# Patient Record
Sex: Male | Born: 1950 | Race: White | Hispanic: No | Marital: Married | Smoking: Former smoker
Health system: Southern US, Community
[De-identification: ages and names within clinical notes are randomized; demographics above are authoritative.]

## PROBLEM LIST (undated history)

## (undated) DIAGNOSIS — K5790 Diverticulosis of intestine, part unspecified, without perforation or abscess without bleeding: Secondary | ICD-10-CM

## (undated) DIAGNOSIS — T508X5A Adverse effect of diagnostic agents, initial encounter: Secondary | ICD-10-CM

## (undated) DIAGNOSIS — Z923 Personal history of irradiation: Secondary | ICD-10-CM

## (undated) DIAGNOSIS — I7090 Unspecified atherosclerosis: Secondary | ICD-10-CM

## (undated) DIAGNOSIS — I10 Essential (primary) hypertension: Secondary | ICD-10-CM

## (undated) DIAGNOSIS — J439 Emphysema, unspecified: Secondary | ICD-10-CM

## (undated) DIAGNOSIS — I714 Abdominal aortic aneurysm, without rupture, unspecified: Secondary | ICD-10-CM

## (undated) DIAGNOSIS — C3490 Malignant neoplasm of unspecified part of unspecified bronchus or lung: Secondary | ICD-10-CM

## (undated) DIAGNOSIS — I639 Cerebral infarction, unspecified: Secondary | ICD-10-CM

## (undated) DIAGNOSIS — K760 Fatty (change of) liver, not elsewhere classified: Secondary | ICD-10-CM

## (undated) DIAGNOSIS — N200 Calculus of kidney: Secondary | ICD-10-CM

## (undated) DIAGNOSIS — E785 Hyperlipidemia, unspecified: Secondary | ICD-10-CM

## (undated) DIAGNOSIS — I2510 Atherosclerotic heart disease of native coronary artery without angina pectoris: Secondary | ICD-10-CM

## (undated) DIAGNOSIS — K219 Gastro-esophageal reflux disease without esophagitis: Secondary | ICD-10-CM

## (undated) DIAGNOSIS — K649 Unspecified hemorrhoids: Secondary | ICD-10-CM

## (undated) DIAGNOSIS — Z5181 Encounter for therapeutic drug level monitoring: Secondary | ICD-10-CM

## (undated) HISTORY — DX: Fatty (change of) liver, not elsewhere classified: K76.0

## (undated) HISTORY — PX: WISDOM TOOTH EXTRACTION: SHX21

## (undated) HISTORY — DX: Unspecified atherosclerosis: I70.90

## (undated) HISTORY — DX: Hyperlipidemia, unspecified: E78.5

## (undated) HISTORY — DX: Unspecified hemorrhoids: K64.9

## (undated) HISTORY — DX: Diverticulosis of intestine, part unspecified, without perforation or abscess without bleeding: K57.90

## (undated) HISTORY — DX: Emphysema, unspecified: J43.9

## (undated) HISTORY — PX: CATARACT EXTRACTION: SUR2

## (undated) HISTORY — DX: Personal history of irradiation: Z92.3

## (undated) HISTORY — DX: Calculus of kidney: N20.0

## (undated) HISTORY — PX: COLONOSCOPY: SHX174

## (undated) HISTORY — DX: Gastro-esophageal reflux disease without esophagitis: K21.9

## (undated) HISTORY — PX: OTHER SURGICAL HISTORY: SHX169

## (undated) HISTORY — DX: Encounter for therapeutic drug level monitoring: Z51.81

---

## 2002-08-27 ENCOUNTER — Ambulatory Visit (HOSPITAL_COMMUNITY): Admission: RE | Admit: 2002-08-27 | Discharge: 2002-08-27 | Payer: Self-pay | Admitting: Internal Medicine

## 2002-08-29 ENCOUNTER — Encounter: Payer: Self-pay | Admitting: Cardiology

## 2002-08-29 ENCOUNTER — Ambulatory Visit (HOSPITAL_COMMUNITY): Admission: RE | Admit: 2002-08-29 | Discharge: 2002-08-30 | Payer: Self-pay | Admitting: Cardiology

## 2003-04-29 ENCOUNTER — Encounter: Payer: Self-pay | Admitting: Internal Medicine

## 2003-04-29 ENCOUNTER — Encounter: Admission: RE | Admit: 2003-04-29 | Discharge: 2003-04-29 | Payer: Self-pay | Admitting: Internal Medicine

## 2003-05-21 ENCOUNTER — Encounter: Admission: RE | Admit: 2003-05-21 | Discharge: 2003-05-21 | Payer: Self-pay | Admitting: Internal Medicine

## 2003-05-21 ENCOUNTER — Encounter: Payer: Self-pay | Admitting: Internal Medicine

## 2003-05-26 ENCOUNTER — Ambulatory Visit (HOSPITAL_COMMUNITY): Admission: RE | Admit: 2003-05-26 | Discharge: 2003-05-26 | Payer: Self-pay | Admitting: Internal Medicine

## 2003-05-26 ENCOUNTER — Encounter: Payer: Self-pay | Admitting: Internal Medicine

## 2003-05-26 ENCOUNTER — Encounter (INDEPENDENT_AMBULATORY_CARE_PROVIDER_SITE_OTHER): Payer: Self-pay | Admitting: *Deleted

## 2003-05-29 ENCOUNTER — Ambulatory Visit: Admission: RE | Admit: 2003-05-29 | Discharge: 2003-08-27 | Payer: Self-pay | Admitting: *Deleted

## 2003-06-10 ENCOUNTER — Ambulatory Visit (HOSPITAL_COMMUNITY): Admission: RE | Admit: 2003-06-10 | Discharge: 2003-06-10 | Payer: Self-pay | Admitting: Oncology

## 2003-06-11 ENCOUNTER — Ambulatory Visit (HOSPITAL_COMMUNITY): Admission: RE | Admit: 2003-06-11 | Discharge: 2003-06-11 | Payer: Self-pay | Admitting: Oncology

## 2003-06-16 ENCOUNTER — Ambulatory Visit (HOSPITAL_COMMUNITY): Admission: RE | Admit: 2003-06-16 | Discharge: 2003-06-16 | Payer: Self-pay | Admitting: Oncology

## 2003-08-13 ENCOUNTER — Ambulatory Visit (HOSPITAL_COMMUNITY): Admission: RE | Admit: 2003-08-13 | Discharge: 2003-08-13 | Payer: Self-pay | Admitting: Oncology

## 2003-12-09 ENCOUNTER — Ambulatory Visit (HOSPITAL_COMMUNITY): Admission: RE | Admit: 2003-12-09 | Discharge: 2003-12-09 | Payer: Self-pay | Admitting: Oncology

## 2003-12-10 ENCOUNTER — Ambulatory Visit (HOSPITAL_COMMUNITY): Admission: RE | Admit: 2003-12-10 | Discharge: 2003-12-10 | Payer: Self-pay | Admitting: Oncology

## 2004-02-19 ENCOUNTER — Ambulatory Visit (HOSPITAL_COMMUNITY): Admission: RE | Admit: 2004-02-19 | Discharge: 2004-02-19 | Payer: Self-pay | Admitting: Oncology

## 2004-05-31 ENCOUNTER — Ambulatory Visit (HOSPITAL_COMMUNITY): Admission: RE | Admit: 2004-05-31 | Discharge: 2004-05-31 | Payer: Self-pay | Admitting: Oncology

## 2004-07-26 ENCOUNTER — Ambulatory Visit: Payer: Self-pay | Admitting: Oncology

## 2004-08-25 ENCOUNTER — Ambulatory Visit: Payer: Self-pay

## 2004-09-01 ENCOUNTER — Ambulatory Visit (HOSPITAL_COMMUNITY): Admission: RE | Admit: 2004-09-01 | Discharge: 2004-09-01 | Payer: Self-pay | Admitting: Oncology

## 2004-09-06 ENCOUNTER — Ambulatory Visit: Payer: Self-pay | Admitting: *Deleted

## 2004-09-12 ENCOUNTER — Ambulatory Visit: Payer: Self-pay | Admitting: Oncology

## 2004-12-01 ENCOUNTER — Ambulatory Visit: Payer: Self-pay | Admitting: Oncology

## 2004-12-05 ENCOUNTER — Ambulatory Visit (HOSPITAL_COMMUNITY): Admission: RE | Admit: 2004-12-05 | Discharge: 2004-12-05 | Payer: Self-pay | Admitting: Oncology

## 2005-01-10 ENCOUNTER — Encounter (INDEPENDENT_AMBULATORY_CARE_PROVIDER_SITE_OTHER): Payer: Self-pay | Admitting: *Deleted

## 2005-01-10 ENCOUNTER — Inpatient Hospital Stay (HOSPITAL_COMMUNITY): Admission: RE | Admit: 2005-01-10 | Discharge: 2005-01-17 | Payer: Self-pay | Admitting: Thoracic Surgery

## 2005-01-17 ENCOUNTER — Ambulatory Visit: Payer: Self-pay | Admitting: Oncology

## 2005-01-24 ENCOUNTER — Encounter: Admission: RE | Admit: 2005-01-24 | Discharge: 2005-01-24 | Payer: Self-pay | Admitting: Thoracic Surgery

## 2005-01-30 ENCOUNTER — Ambulatory Visit: Payer: Self-pay | Admitting: Oncology

## 2005-02-15 ENCOUNTER — Encounter: Admission: RE | Admit: 2005-02-15 | Discharge: 2005-02-15 | Payer: Self-pay | Admitting: Thoracic Surgery

## 2005-03-15 ENCOUNTER — Ambulatory Visit: Payer: Self-pay | Admitting: *Deleted

## 2005-03-20 ENCOUNTER — Ambulatory Visit: Payer: Self-pay | Admitting: *Deleted

## 2005-03-29 ENCOUNTER — Encounter: Admission: RE | Admit: 2005-03-29 | Discharge: 2005-03-29 | Payer: Self-pay | Admitting: Thoracic Surgery

## 2005-04-04 ENCOUNTER — Ambulatory Visit (HOSPITAL_COMMUNITY): Admission: RE | Admit: 2005-04-04 | Discharge: 2005-04-04 | Payer: Self-pay | Admitting: Oncology

## 2005-04-19 ENCOUNTER — Ambulatory Visit: Payer: Self-pay | Admitting: Oncology

## 2005-07-10 ENCOUNTER — Ambulatory Visit: Payer: Self-pay | Admitting: Oncology

## 2005-07-13 ENCOUNTER — Ambulatory Visit (HOSPITAL_COMMUNITY): Admission: RE | Admit: 2005-07-13 | Discharge: 2005-07-13 | Payer: Self-pay | Admitting: Oncology

## 2005-07-22 ENCOUNTER — Ambulatory Visit (HOSPITAL_COMMUNITY): Admission: RE | Admit: 2005-07-22 | Discharge: 2005-07-22 | Payer: Self-pay | Admitting: Oncology

## 2005-07-23 ENCOUNTER — Ambulatory Visit (HOSPITAL_COMMUNITY): Admission: RE | Admit: 2005-07-23 | Discharge: 2005-07-23 | Payer: Self-pay | Admitting: Oncology

## 2005-08-25 ENCOUNTER — Ambulatory Visit: Payer: Self-pay

## 2005-08-25 ENCOUNTER — Ambulatory Visit: Payer: Self-pay | Admitting: *Deleted

## 2005-08-29 ENCOUNTER — Ambulatory Visit: Payer: Self-pay | Admitting: *Deleted

## 2005-11-06 ENCOUNTER — Ambulatory Visit: Payer: Self-pay | Admitting: Oncology

## 2005-11-09 ENCOUNTER — Ambulatory Visit (HOSPITAL_COMMUNITY): Admission: RE | Admit: 2005-11-09 | Discharge: 2005-11-09 | Payer: Self-pay | Admitting: Oncology

## 2005-12-07 ENCOUNTER — Ambulatory Visit (HOSPITAL_COMMUNITY): Admission: RE | Admit: 2005-12-07 | Discharge: 2005-12-07 | Payer: Self-pay | Admitting: Internal Medicine

## 2005-12-27 ENCOUNTER — Ambulatory Visit (HOSPITAL_COMMUNITY): Admission: RE | Admit: 2005-12-27 | Discharge: 2005-12-27 | Payer: Self-pay | Admitting: Oncology

## 2006-04-23 ENCOUNTER — Ambulatory Visit: Payer: Self-pay | Admitting: *Deleted

## 2006-06-04 ENCOUNTER — Ambulatory Visit: Payer: Self-pay | Admitting: Oncology

## 2006-06-07 ENCOUNTER — Ambulatory Visit (HOSPITAL_COMMUNITY): Admission: RE | Admit: 2006-06-07 | Discharge: 2006-06-07 | Payer: Self-pay | Admitting: Oncology

## 2006-08-27 ENCOUNTER — Ambulatory Visit: Payer: Self-pay

## 2006-10-08 ENCOUNTER — Ambulatory Visit: Payer: Self-pay | Admitting: Oncology

## 2006-10-08 LAB — COMPREHENSIVE METABOLIC PANEL
ALT: 22 U/L (ref 0–53)
AST: 19 U/L (ref 0–37)
Albumin: 4.2 g/dL (ref 3.5–5.2)
CO2: 28 mEq/L (ref 19–32)
Calcium: 8.8 mg/dL (ref 8.4–10.5)
Chloride: 108 mEq/L (ref 96–112)
Potassium: 4.5 mEq/L (ref 3.5–5.3)

## 2006-10-08 LAB — CBC WITH DIFFERENTIAL/PLATELET
BASO%: 1.1 % (ref 0.0–2.0)
Basophils Absolute: 0.1 10*3/uL (ref 0.0–0.1)
EOS%: 1.7 % (ref 0.0–7.0)
HGB: 14.4 g/dL (ref 13.0–17.1)
MCH: 32.8 pg (ref 28.0–33.4)
MCHC: 36.1 g/dL — ABNORMAL HIGH (ref 32.0–35.9)
MONO#: 0.4 10*3/uL (ref 0.1–0.9)
RDW: 14.4 % (ref 11.2–14.6)
WBC: 7.2 10*3/uL (ref 4.0–10.0)
lymph#: 1.3 10*3/uL (ref 0.9–3.3)

## 2006-10-09 ENCOUNTER — Ambulatory Visit (HOSPITAL_COMMUNITY): Admission: RE | Admit: 2006-10-09 | Discharge: 2006-10-09 | Payer: Self-pay | Admitting: Oncology

## 2006-10-16 ENCOUNTER — Ambulatory Visit: Payer: Self-pay | Admitting: *Deleted

## 2007-01-21 ENCOUNTER — Ambulatory Visit: Payer: Self-pay

## 2007-02-04 ENCOUNTER — Ambulatory Visit: Payer: Self-pay | Admitting: Oncology

## 2007-02-04 ENCOUNTER — Ambulatory Visit: Payer: Self-pay | Admitting: Cardiovascular Disease

## 2007-02-07 LAB — CBC WITH DIFFERENTIAL/PLATELET
Eosinophils Absolute: 0.1 10*3/uL (ref 0.0–0.5)
HCT: 38.3 % — ABNORMAL LOW (ref 38.7–49.9)
LYMPH%: 24.6 % (ref 14.0–48.0)
MONO#: 0.4 10*3/uL (ref 0.1–0.9)
NEUT#: 4 10*3/uL (ref 1.5–6.5)
NEUT%: 66.1 % (ref 40.0–75.0)
Platelets: 244 10*3/uL (ref 145–400)
WBC: 6 10*3/uL (ref 4.0–10.0)

## 2007-02-07 LAB — COMPREHENSIVE METABOLIC PANEL
CO2: 26 mEq/L (ref 19–32)
Calcium: 9.2 mg/dL (ref 8.4–10.5)
Creatinine, Ser: 1.02 mg/dL (ref 0.40–1.50)
Glucose, Bld: 205 mg/dL — ABNORMAL HIGH (ref 70–99)
Sodium: 141 mEq/L (ref 135–145)
Total Bilirubin: 0.8 mg/dL (ref 0.3–1.2)
Total Protein: 6.6 g/dL (ref 6.0–8.3)

## 2007-02-07 LAB — LACTATE DEHYDROGENASE: LDH: 132 U/L (ref 94–250)

## 2007-02-07 LAB — CEA: CEA: 0.7 ng/mL (ref 0.0–5.0)

## 2007-02-11 ENCOUNTER — Ambulatory Visit (HOSPITAL_COMMUNITY): Admission: RE | Admit: 2007-02-11 | Discharge: 2007-02-11 | Payer: Self-pay | Admitting: Oncology

## 2007-07-26 ENCOUNTER — Ambulatory Visit: Payer: Self-pay | Admitting: Cardiovascular Disease

## 2007-08-27 ENCOUNTER — Ambulatory Visit: Payer: Self-pay | Admitting: Oncology

## 2007-08-29 LAB — COMPREHENSIVE METABOLIC PANEL
ALT: 41 U/L (ref 0–53)
Albumin: 4.2 g/dL (ref 3.5–5.2)
Alkaline Phosphatase: 76 U/L (ref 39–117)
Glucose, Bld: 174 mg/dL — ABNORMAL HIGH (ref 70–99)
Potassium: 4.2 mEq/L (ref 3.5–5.3)
Sodium: 140 mEq/L (ref 135–145)
Total Bilirubin: 0.9 mg/dL (ref 0.3–1.2)
Total Protein: 7.1 g/dL (ref 6.0–8.3)

## 2007-08-29 LAB — CBC WITH DIFFERENTIAL/PLATELET
BASO%: 0.4 % (ref 0.0–2.0)
Basophils Absolute: 0 10*3/uL (ref 0.0–0.1)
EOS%: 2.8 % (ref 0.0–7.0)
HGB: 14.7 g/dL (ref 13.0–17.1)
MCH: 33.4 pg (ref 28.0–33.4)
RBC: 4.4 10*6/uL (ref 4.20–5.71)
RDW: 14.9 % — ABNORMAL HIGH (ref 11.2–14.6)
lymph#: 1.8 10*3/uL (ref 0.9–3.3)

## 2007-08-30 ENCOUNTER — Ambulatory Visit (HOSPITAL_COMMUNITY): Admission: RE | Admit: 2007-08-30 | Discharge: 2007-08-30 | Payer: Self-pay | Admitting: Oncology

## 2008-01-13 ENCOUNTER — Ambulatory Visit: Payer: Self-pay

## 2008-02-20 ENCOUNTER — Ambulatory Visit: Payer: Self-pay | Admitting: Oncology

## 2008-02-25 LAB — COMPREHENSIVE METABOLIC PANEL
ALT: 51 U/L (ref 0–53)
AST: 30 U/L (ref 0–37)
Albumin: 4 g/dL (ref 3.5–5.2)
Alkaline Phosphatase: 84 U/L (ref 39–117)
Glucose, Bld: 183 mg/dL — ABNORMAL HIGH (ref 70–99)
Potassium: 4.7 mEq/L (ref 3.5–5.3)
Sodium: 138 mEq/L (ref 135–145)
Total Protein: 6.8 g/dL (ref 6.0–8.3)

## 2008-02-25 LAB — CBC WITH DIFFERENTIAL/PLATELET
Eosinophils Absolute: 0.1 10*3/uL (ref 0.0–0.5)
MCV: 92.7 fL (ref 81.6–98.0)
MONO%: 8.4 % (ref 0.0–13.0)
NEUT#: 5.2 10*3/uL (ref 1.5–6.5)
RBC: 4.44 10*6/uL (ref 4.20–5.71)
RDW: 15.2 % — ABNORMAL HIGH (ref 11.2–14.6)
WBC: 8 10*3/uL (ref 4.0–10.0)

## 2008-02-25 LAB — LACTATE DEHYDROGENASE: LDH: 146 U/L (ref 94–250)

## 2008-02-25 LAB — CEA: CEA: 1.2 ng/mL (ref 0.0–5.0)

## 2008-02-27 ENCOUNTER — Ambulatory Visit (HOSPITAL_COMMUNITY): Admission: RE | Admit: 2008-02-27 | Discharge: 2008-02-27 | Payer: Self-pay | Admitting: Oncology

## 2008-09-07 ENCOUNTER — Ambulatory Visit: Payer: Self-pay | Admitting: Cardiovascular Disease

## 2008-09-24 ENCOUNTER — Ambulatory Visit: Payer: Self-pay | Admitting: Oncology

## 2008-09-28 LAB — CBC WITH DIFFERENTIAL/PLATELET
BASO%: 0.3 % (ref 0.0–2.0)
Basophils Absolute: 0 10*3/uL (ref 0.0–0.1)
EOS%: 1.7 % (ref 0.0–7.0)
HCT: 41.9 % (ref 38.7–49.9)
HGB: 15 g/dL (ref 13.0–17.1)
LYMPH%: 22.1 % (ref 14.0–48.0)
MCH: 33 pg (ref 28.0–33.4)
MCHC: 35.8 g/dL (ref 32.0–35.9)
NEUT%: 67.9 % (ref 40.0–75.0)
Platelets: 209 10*3/uL (ref 145–400)
lymph#: 1.7 10*3/uL (ref 0.9–3.3)

## 2008-09-28 LAB — LACTATE DEHYDROGENASE: LDH: 116 U/L (ref 94–250)

## 2008-09-28 LAB — COMPREHENSIVE METABOLIC PANEL
BUN: 13 mg/dL (ref 6–23)
CO2: 24 mEq/L (ref 19–32)
Calcium: 9.6 mg/dL (ref 8.4–10.5)
Chloride: 105 mEq/L (ref 96–112)
Creatinine, Ser: 1.31 mg/dL (ref 0.40–1.50)
Glucose, Bld: 218 mg/dL — ABNORMAL HIGH (ref 70–99)

## 2008-10-01 ENCOUNTER — Ambulatory Visit (HOSPITAL_COMMUNITY): Admission: RE | Admit: 2008-10-01 | Discharge: 2008-10-01 | Payer: Self-pay | Admitting: Oncology

## 2009-01-14 ENCOUNTER — Encounter: Payer: Self-pay | Admitting: Cardiovascular Disease

## 2009-01-14 ENCOUNTER — Ambulatory Visit: Payer: Self-pay

## 2009-05-11 ENCOUNTER — Ambulatory Visit: Payer: Self-pay | Admitting: Oncology

## 2009-05-13 ENCOUNTER — Ambulatory Visit (HOSPITAL_COMMUNITY): Admission: RE | Admit: 2009-05-13 | Discharge: 2009-05-13 | Payer: Self-pay | Admitting: Oncology

## 2009-05-13 LAB — COMPREHENSIVE METABOLIC PANEL
ALT: 31 U/L (ref 0–53)
AST: 24 U/L (ref 0–37)
Albumin: 4 g/dL (ref 3.5–5.2)
Alkaline Phosphatase: 74 U/L (ref 39–117)
Potassium: 4.7 mEq/L (ref 3.5–5.3)
Sodium: 137 mEq/L (ref 135–145)
Total Bilirubin: 1.4 mg/dL — ABNORMAL HIGH (ref 0.3–1.2)
Total Protein: 7.3 g/dL (ref 6.0–8.3)

## 2009-05-13 LAB — CBC WITH DIFFERENTIAL/PLATELET
BASO%: 0.5 % (ref 0.0–2.0)
EOS%: 2.4 % (ref 0.0–7.0)
Eosinophils Absolute: 0.2 10*3/uL (ref 0.0–0.5)
LYMPH%: 26.3 % (ref 14.0–49.0)
MCH: 32.8 pg (ref 27.2–33.4)
MCHC: 36.3 g/dL — ABNORMAL HIGH (ref 32.0–36.0)
MCV: 90.4 fL (ref 79.3–98.0)
MONO%: 8 % (ref 0.0–14.0)
NEUT#: 5 10*3/uL (ref 1.5–6.5)
RBC: 4.67 10*6/uL (ref 4.20–5.82)
RDW: 14.2 % (ref 11.0–14.6)

## 2009-05-20 ENCOUNTER — Encounter: Payer: Self-pay | Admitting: Cardiovascular Disease

## 2009-08-19 DIAGNOSIS — I10 Essential (primary) hypertension: Secondary | ICD-10-CM | POA: Insufficient documentation

## 2009-08-19 DIAGNOSIS — Z8679 Personal history of other diseases of the circulatory system: Secondary | ICD-10-CM | POA: Insufficient documentation

## 2009-08-19 DIAGNOSIS — Z85118 Personal history of other malignant neoplasm of bronchus and lung: Secondary | ICD-10-CM

## 2009-08-19 DIAGNOSIS — E785 Hyperlipidemia, unspecified: Secondary | ICD-10-CM

## 2009-08-30 ENCOUNTER — Ambulatory Visit: Payer: Self-pay | Admitting: Cardiovascular Disease

## 2009-09-22 ENCOUNTER — Ambulatory Visit: Payer: Self-pay | Admitting: Oncology

## 2009-11-11 ENCOUNTER — Ambulatory Visit (HOSPITAL_COMMUNITY): Admission: RE | Admit: 2009-11-11 | Discharge: 2009-11-11 | Payer: Self-pay | Admitting: Oncology

## 2009-11-16 ENCOUNTER — Ambulatory Visit: Payer: Self-pay | Admitting: Oncology

## 2009-11-16 LAB — CBC WITH DIFFERENTIAL/PLATELET
BASO%: 0.4 % (ref 0.0–2.0)
Basophils Absolute: 0 10*3/uL (ref 0.0–0.1)
MCH: 33.2 pg (ref 27.2–33.4)
MCHC: 34.7 g/dL (ref 32.0–36.0)
MONO#: 0.7 10*3/uL (ref 0.1–0.9)
MONO%: 9.2 % (ref 0.0–14.0)
Platelets: 227 10*3/uL (ref 140–400)
RBC: 4.32 10*6/uL (ref 4.20–5.82)
RDW: 15.5 % — ABNORMAL HIGH (ref 11.0–14.6)
WBC: 8.1 10*3/uL (ref 4.0–10.3)

## 2009-11-16 LAB — CEA: CEA: 0.9 ng/mL (ref 0.0–5.0)

## 2009-11-16 LAB — COMPREHENSIVE METABOLIC PANEL
Albumin: 4.1 g/dL (ref 3.5–5.2)
Alkaline Phosphatase: 63 U/L (ref 39–117)
Glucose, Bld: 200 mg/dL — ABNORMAL HIGH (ref 70–99)
Potassium: 4.8 mEq/L (ref 3.5–5.3)
Sodium: 138 mEq/L (ref 135–145)
Total Protein: 6.7 g/dL (ref 6.0–8.3)

## 2009-11-18 ENCOUNTER — Encounter: Payer: Self-pay | Admitting: Cardiovascular Disease

## 2010-01-26 ENCOUNTER — Encounter: Payer: Self-pay | Admitting: Cardiovascular Disease

## 2010-01-26 ENCOUNTER — Ambulatory Visit: Payer: Self-pay

## 2010-06-03 ENCOUNTER — Ambulatory Visit: Payer: Self-pay | Admitting: Oncology

## 2010-06-07 ENCOUNTER — Ambulatory Visit (HOSPITAL_COMMUNITY): Admission: RE | Admit: 2010-06-07 | Discharge: 2010-06-07 | Payer: Self-pay | Admitting: Oncology

## 2010-06-07 LAB — COMPREHENSIVE METABOLIC PANEL
BUN: 14 mg/dL (ref 6–23)
Calcium: 10 mg/dL (ref 8.4–10.5)
Potassium: 4.7 mEq/L (ref 3.5–5.3)
Sodium: 138 mEq/L (ref 135–145)

## 2010-06-07 LAB — CBC WITH DIFFERENTIAL/PLATELET
Eosinophils Absolute: 0.3 10*3/uL (ref 0.0–0.5)
HGB: 16.5 g/dL (ref 13.0–17.1)
LYMPH%: 17.4 % (ref 14.0–49.0)
MCH: 34.1 pg — ABNORMAL HIGH (ref 27.2–33.4)
MCHC: 35.6 g/dL (ref 32.0–36.0)
MONO#: 0.7 10*3/uL (ref 0.1–0.9)
NEUT#: 7.6 10*3/uL — ABNORMAL HIGH (ref 1.5–6.5)
NEUT%: 72.8 % (ref 39.0–75.0)
RBC: 4.84 10*6/uL (ref 4.20–5.82)
WBC: 10.5 10*3/uL — ABNORMAL HIGH (ref 4.0–10.3)
lymph#: 1.8 10*3/uL (ref 0.9–3.3)

## 2010-06-07 LAB — LACTATE DEHYDROGENASE: LDH: 127 U/L (ref 94–250)

## 2010-06-14 ENCOUNTER — Encounter: Payer: Self-pay | Admitting: Cardiovascular Disease

## 2010-08-25 ENCOUNTER — Ambulatory Visit
Admission: RE | Admit: 2010-08-25 | Discharge: 2010-08-25 | Payer: Self-pay | Source: Home / Self Care | Attending: Cardiovascular Disease | Admitting: Cardiovascular Disease

## 2010-08-25 ENCOUNTER — Encounter: Payer: Self-pay | Admitting: Cardiovascular Disease

## 2010-09-02 ENCOUNTER — Other Ambulatory Visit: Payer: Self-pay | Admitting: Oncology

## 2010-09-02 DIAGNOSIS — Z85118 Personal history of other malignant neoplasm of bronchus and lung: Secondary | ICD-10-CM

## 2010-09-03 ENCOUNTER — Encounter: Payer: Self-pay | Admitting: Oncology

## 2010-09-03 ENCOUNTER — Encounter: Payer: Self-pay | Admitting: Thoracic Surgery

## 2010-09-08 ENCOUNTER — Encounter
Admission: RE | Admit: 2010-09-08 | Discharge: 2010-09-08 | Payer: Self-pay | Source: Home / Self Care | Attending: Internal Medicine | Admitting: Internal Medicine

## 2010-09-13 NOTE — Letter (Signed)
Summary: [facility]  [facility]   Imported By: Sherian Rein 07/05/2010 15:08:26  _____________________________________________________________________  External Attachment:    Type:   Image     Comment:   External Document

## 2010-09-13 NOTE — Miscellaneous (Signed)
Summary: Orders Update  Clinical Lists Changes  Orders: Added new Test order of Abdominal Aorta Duplex (Abd Aorta Duplex) - Signed 

## 2010-09-13 NOTE — Letter (Signed)
[facility]   Imported By: Marylou Mccoy 02/09/2010 10:49:44  _____________________________________________________________________  External Attachment:    Type:   Image     Comment:   External Document

## 2010-09-13 NOTE — Assessment & Plan Note (Signed)
Summary: YEARLY/SL   Visit Type:  1 year follow up Primary Provider:  Dr Chilton Si  CC:  No complaints.  History of Present Illness: This is a 60 year old gentleman with CAD who underwent his most recent PCI procedure in 2004.  He presents today for followup evaluation.  He reports mild chest tightness when he walks on very cold days. This resolved with slowing down or resting. This is stable and unchanged over the past few years. He has not taken sublingual nitroglycerin. He denies dyspnea, edema, lightheadedness, or palpitations. He has no other complaints at this time.  Current Medications (verified): 1)  Lipitor 40 Mg Tabs (Atorvastatin Calcium) .... Take One Tablet By Mouth Daily. 2)  Toprol Xl 100 Mg Xr24h-Tab (Metoprolol Succinate) .... Take 1 Tablet By Mouth Once A Day 3)  Tarceva 150 Mg Tabs (Erlotinib) .... Take 1 Tablet By Mouth Once A Day 4)  Aspirin 81 Mg Tbec (Aspirin) .... Take One Tablet By Mouth Daily 5)  Metformin Hcl 850 Mg Tabs (Metformin Hcl) .... Take 1 Tablet By Mouth Two Times A Day  Allergies: 1)  ! * Ivp Dye  Past History:  Past medical history reviewed for relevance to current acute and chronic problems.  Past Medical History: Current Problems:  CAD (ICD-414.00)- PCI in 2004 with stenting of the left circumflex HYPERTENSION, UNSPECIFIED (ICD-401.9) DYSLIPIDEMIA (ICD-272.4) ABDOMINAL AORTIC ANEURYSM, HX OF (ICD-V12.50) LUNG CANCER, HX OF (ICD-V10.11)  Review of Systems       Negative except as per HPI   Vital Signs:  Patient profile:   60 year old male Height:      74 inches Weight:      225.50 pounds BMI:     29.06 Pulse rate:   84 / minute Pulse rhythm:   regular Resp:     18 per minute BP sitting:   126 / 74  (left arm) Cuff size:   large  Vitals Entered By: Vikki Ports (August 30, 2009 9:21 AM)  Physical Exam  General:  Pt is alert and oriented, in no acute distress. HEENT: normal Neck: normal carotid upstrokes without bruits, JVP  normal Lungs: CTA CV: RRR without murmur or gallop Abd: soft, NT, positive BS, no bruit, no organomegaly Ext: no clubbing, cyanosis, or edema. peripheral pulses 2+ and equal Skin: warm and dry without rash    EKG  Procedure date:  08/30/2009  Findings:      Normal sinus rhythm, heart rate 84 beats per minute, within normal limits.  Impression & Recommendations:  Problem # 1:  CAD (ICD-414.00) The patient has mild exertional angina and very cold weather, class II symptoms. Recommend continued current medical therapy without changes at this time. We reviewed carefully that he needs to observe for any change in symptoms. He will report these changes if he feels symptoms of a lesser workload or of increasing severity. His updated medication list for this problem includes:    Toprol Xl 100 Mg Xr24h-tab (Metoprolol succinate) .Marland Kitchen... Take 1 tablet by mouth once a day    Aspirin 81 Mg Tbec (Aspirin) .Marland Kitchen... Take one tablet by mouth daily  Problem # 2:  HYPERTENSION, UNSPECIFIED (ICD-401.9) Blood pressure controlled on current treatment. His updated medication list for this problem includes:    Toprol Xl 100 Mg Xr24h-tab (Metoprolol succinate) .Marland Kitchen... Take 1 tablet by mouth once a day    Aspirin 81 Mg Tbec (Aspirin) .Marland Kitchen... Take one tablet by mouth daily  Problem # 3:  DYSLIPIDEMIA (ICD-272.4) He is followed  by Dr. Chilton Si. I don't have copies of his lipids. He tells me total cholesterol was 114 at his last check. LDL goal is less than 100. His updated medication list for this problem includes:    Lipitor 40 Mg Tabs (Atorvastatin calcium) .Marland Kitchen... Take one tablet by mouth daily.  Patient Instructions: 1)  Your physician recommends that you schedule a follow-up appointment in:  one year

## 2010-09-15 NOTE — Assessment & Plan Note (Signed)
Summary: f/u one year   Visit Type:  1 year follow up Primary Provider:  Dr Chilton Si  CC:  none.  History of Present Illness: This is a 60 year old gentleman with CAD who underwent his most recent PCI procedure in 2004.  He presents today for followup evaluation.  He is doing well without chest pain. He hasn't been very active of late. The patient denies dyspnea, orthopnea, PND, edema, palpitations, lightheadedness, or syncope.   Current Medications (verified): 1)  Lipitor 40 Mg Tabs (Atorvastatin Calcium) .... Take One Tablet By Mouth Daily. 2)  Toprol Xl 100 Mg Xr24h-Tab (Metoprolol Succinate) .... Take 1 Tablet By Mouth Once A Day 3)  Tarceva 150 Mg Tabs (Erlotinib) .... Take 1 Tablet By Mouth Once A Day 4)  Aspirin 81 Mg Tbec (Aspirin) .... Take One Tablet By Mouth Daily 5)  Glimepiride 4 Mg Tabs (Glimepiride) .... Take 1 Tablet By Mouth Once A Day  Allergies: 1)  ! * Ivp Dye  Past History:  Past medical history reviewed for relevance to current acute and chronic problems.  Past Medical History: Reviewed history from 08/30/2009 and no changes required. Current Problems:  CAD (ICD-414.00)- PCI in 2004 with stenting of the left circumflex HYPERTENSION, UNSPECIFIED (ICD-401.9) DYSLIPIDEMIA (ICD-272.4) ABDOMINAL AORTIC ANEURYSM, HX OF (ICD-V12.50) LUNG CANCER, HX OF (ICD-V10.11)  Review of Systems       Negative except as per HPI   Vital Signs:  Patient profile:   60 year old male Height:      74 inches Weight:      230.25 pounds BMI:     29.67 Pulse rate:   81 / minute Pulse rhythm:   regular Resp:     18 per minute BP sitting:   140 / 80  (left arm) Cuff size:   large  Vitals Entered By: Vikki Ports (August 25, 2010 12:04 PM)  Physical Exam  General:  Pt is alert and oriented, in no acute distress. HEENT: normal Neck: normal carotid upstrokes without bruits, JVP normal Lungs: CTA CV: RRR without murmur or gallop Abd: soft, NT, positive BS, no bruit, no  organomegaly Ext: no clubbing, cyanosis, or edema. peripheral pulses 2+ and equal Skin: warm and dry without rash    EKG  Procedure date:  08/25/2010  Findings:      NSR 81 bpm, within normal limits.  Impression & Recommendations:  Problem # 1:  CAD (ICD-414.00) Stable without angina. Continue ASA and beta-blocker.  His updated medication list for this problem includes:    Toprol Xl 100 Mg Xr24h-tab (Metoprolol succinate) .Marland Kitchen... Take 1 tablet by mouth once a day    Aspirin 81 Mg Tbec (Aspirin) .Marland Kitchen... Take one tablet by mouth daily  Orders: EKG w/ Interpretation (93000)  Problem # 2:  ABDOMINAL AORTIC ANEURYSM, HX OF (ICD-V12.50) Duplex last year showed 3.5 cm max dimension of abdominal aortic aneurysm. Followup at one year intervals.  Orders: EKG w/ Interpretation (93000)  Problem # 3:  HYPERTENSION, UNSPECIFIED (ICD-401.9) Controlled.  His updated medication list for this problem includes:    Toprol Xl 100 Mg Xr24h-tab (Metoprolol succinate) .Marland Kitchen... Take 1 tablet by mouth once a day    Aspirin 81 Mg Tbec (Aspirin) .Marland Kitchen... Take one tablet by mouth daily  Orders: EKG w/ Interpretation (93000)  BP today: 140/80 Prior BP: 126/74 (08/30/2009)  Patient Instructions: 1)  Your physician recommends that you continue on your current medications as directed. Please refer to the Current Medication list given to you today. 2)  Your physician wants you to follow-up in:  1 YEAR.  You will receive a reminder letter in the mail two months in advance. If you don't receive a letter, please call our office to schedule the follow-up appointment. 3)  Your physician has requested that you have an abdominal aorta duplex in JUNE 2012. During this test, an ultrasound is used to evaluate the aorta. Allow 30 minutes for this exam. Do not eat after midnight the day before and avoid carbonated beverages. There are no restrictions or special instructions.

## 2010-11-04 ENCOUNTER — Other Ambulatory Visit: Payer: Self-pay | Admitting: Internal Medicine

## 2010-11-04 ENCOUNTER — Ambulatory Visit
Admission: RE | Admit: 2010-11-04 | Discharge: 2010-11-04 | Disposition: A | Discharge status: Home / Self Care | Payer: Medicare Other | Source: Ambulatory Visit | Attending: Internal Medicine | Admitting: Internal Medicine

## 2010-11-04 DIAGNOSIS — Z85118 Personal history of other malignant neoplasm of bronchus and lung: Secondary | ICD-10-CM

## 2010-11-04 DIAGNOSIS — R05 Cough: Secondary | ICD-10-CM

## 2010-12-06 ENCOUNTER — Ambulatory Visit (HOSPITAL_COMMUNITY)
Admission: RE | Admit: 2010-12-06 | Discharge: 2010-12-06 | Disposition: A | Discharge status: Home / Self Care | Payer: Medicare Other | Source: Ambulatory Visit | Attending: Oncology | Admitting: Oncology

## 2010-12-06 ENCOUNTER — Encounter (HOSPITAL_COMMUNITY): Payer: Self-pay

## 2010-12-06 ENCOUNTER — Encounter (HOSPITAL_BASED_OUTPATIENT_CLINIC_OR_DEPARTMENT_OTHER): Payer: Medicare Other | Admitting: Oncology

## 2010-12-06 ENCOUNTER — Other Ambulatory Visit: Payer: Self-pay | Admitting: Oncology

## 2010-12-06 DIAGNOSIS — I10 Essential (primary) hypertension: Secondary | ICD-10-CM | POA: Insufficient documentation

## 2010-12-06 DIAGNOSIS — I714 Abdominal aortic aneurysm, without rupture, unspecified: Secondary | ICD-10-CM | POA: Insufficient documentation

## 2010-12-06 DIAGNOSIS — C3490 Malignant neoplasm of unspecified part of unspecified bronchus or lung: Secondary | ICD-10-CM

## 2010-12-06 DIAGNOSIS — J438 Other emphysema: Secondary | ICD-10-CM | POA: Insufficient documentation

## 2010-12-06 DIAGNOSIS — K7689 Other specified diseases of liver: Secondary | ICD-10-CM | POA: Insufficient documentation

## 2010-12-06 DIAGNOSIS — I700 Atherosclerosis of aorta: Secondary | ICD-10-CM | POA: Insufficient documentation

## 2010-12-06 DIAGNOSIS — J984 Other disorders of lung: Secondary | ICD-10-CM | POA: Insufficient documentation

## 2010-12-06 DIAGNOSIS — Z85118 Personal history of other malignant neoplasm of bronchus and lung: Secondary | ICD-10-CM

## 2010-12-06 DIAGNOSIS — I2510 Atherosclerotic heart disease of native coronary artery without angina pectoris: Secondary | ICD-10-CM | POA: Insufficient documentation

## 2010-12-06 LAB — CBC WITH DIFFERENTIAL/PLATELET
BASO%: 0.4 % (ref 0.0–2.0)
EOS%: 1.9 % (ref 0.0–7.0)
HCT: 45.8 % (ref 38.4–49.9)
LYMPH%: 21.7 % (ref 14.0–49.0)
MCH: 33.7 pg — ABNORMAL HIGH (ref 27.2–33.4)
MCHC: 35 g/dL (ref 32.0–36.0)
MONO%: 7.4 % (ref 0.0–14.0)
NEUT%: 68.6 % (ref 39.0–75.0)
Platelets: 199 10*3/uL (ref 140–400)
lymph#: 2 10*3/uL (ref 0.9–3.3)

## 2010-12-06 LAB — CEA: CEA: 1.7 ng/mL (ref 0.0–5.0)

## 2010-12-06 LAB — LACTATE DEHYDROGENASE: LDH: 129 U/L (ref 94–250)

## 2010-12-06 LAB — CMP (CANCER CENTER ONLY)
ALT(SGPT): 55 U/L — ABNORMAL HIGH (ref 10–47)
Albumin: 3.7 g/dL (ref 3.3–5.5)
CO2: 26 mEq/L (ref 18–33)
Glucose, Bld: 331 mg/dL — ABNORMAL HIGH (ref 73–118)
Potassium: 4.4 mEq/L (ref 3.3–4.7)
Sodium: 135 mEq/L (ref 128–145)
Total Bilirubin: 1.1 mg/dl (ref 0.20–1.60)
Total Protein: 7.3 g/dL (ref 6.4–8.1)

## 2010-12-06 MED ORDER — IOHEXOL 300 MG/ML  SOLN
100.0000 mL | Freq: Once | INTRAMUSCULAR | Status: AC | PRN
  Administered 2010-12-06: 100 mL via INTRAVENOUS

## 2010-12-13 ENCOUNTER — Encounter (HOSPITAL_BASED_OUTPATIENT_CLINIC_OR_DEPARTMENT_OTHER): Payer: Medicare Other | Admitting: Oncology

## 2010-12-13 DIAGNOSIS — C3490 Malignant neoplasm of unspecified part of unspecified bronchus or lung: Secondary | ICD-10-CM

## 2010-12-27 NOTE — Assessment & Plan Note (Signed)
[facility]                            CARDIOLOGY OFFICE NOTE   NAME:[name], [name]                        MRN:          [id number]  DATE:09/07/2008                            DOB:          11-30-1950    REASON FOR VISIT:  Followup CAD.   HISTORY OF PRESENT ILLNESS:  [name] is a 60 year old gentleman with  history of coronary artery disease who has undergone stent coronary  stenting back in 2004.  He was treated with a bare-metal stent in the  right coronary artery and a drug-eluting stent in the left circumflex.  His procedure was done for stable angina.  The patient has done well  ever since that time from a cardiac standpoint.  He denies chest pain,  dyspnea, edema, orthopnea, PND, or palpitations.  He also is followed  for an abdominal aortic aneurysm that has been stable by serial  ultrasound studies.   MEDICATIONS:  1. Toprol-XL 100 mg daily.  2. Lipitor 40 mg daily.  3. Tarceva 150 mg daily.  4. Aspirin 81 mg daily.   ALLERGIES:  CONTRAST MEDIA.   PHYSICAL EXAMINATION:  GENERAL:  The patient is alert and oriented.  He  is in no distress.  VITAL SIGNS:  Weight is 226 pounds.  Blood pressure 124/82, heart rate  84, and respiratory rate is 12.  HEENT:  Normal.  NECK:  Normal carotid upstrokes.  No bruits.  JVP normal.  LUNGS:  Clear bilaterally.  HEART:  Regular rate and rhythm.  No murmurs or gallops.  ABDOMEN:  Soft, nontender.  No organomegaly.  No abdominal bruits.  EXTREMITIES:  No clubbing, cyanosis, or edema.  Peripheral pulses are 2+  and equal throughout.   EKG shows normal sinus rhythm and is within normal limits.   ASSESSMENT:  1. Coronary artery disease.  The patient remains asymptomatic.  He is      sedentary and I have asked him to increase his activity level and      exercise program.  He has a treadmill and recumbent bike at home      and he is going to try to start working out.  He should continue on      his  medical therapy, which includes an aspirin, beta-blocker, and      statin.  2. Dyslipidemia.  He is on atorvastatin and is followed by Dr. Chilton Si.  3. Abdominal aortic aneurysm.  Last ultrasound was January 13, 2008, and      his infrarenal abdominal aortic aneurysm was stable at 3.5 x 3.6 cm      in maximum diameter.  Followup in 1 year.  4. Hypertension.  Blood pressure is appropriately control on a beta-      blocker in the setting of his coronary artery disease and abdominal      aortic aneurysm.   For followup, I would like to see [name] in 1 year.     Veverly Fells. Excell Seltzer, MD  Electronically Signed    MDC/MedQ  DD: 09/07/2008  DT: 09/08/2008  Job #: [id number]   cc:  Erskine Speed, M.D.

## 2010-12-27 NOTE — Assessment & Plan Note (Signed)
[facility]                            CARDIOLOGY OFFICE NOTE   NAME:[name], DHRUVAN [name]                        MRN:          [id number]  DATE:02/04/2007                            DOB:          Jun 18, 1951    [name] was seen in followup at the [facility] Cardiology Office on  February 04, 2007.  He is a 60 year old gentleman who has been seen by E.  Graceann Congress, MD, [facility] and he is going to follow with me from here  forward.  He has coronary artery disease and has had a bare metal stent  placed in the right coronary artery and a CYPHER stent in the left  circumflex.  He has also had lung cancer, treated with radiation and  left upper lobe resection.  He has an infrarenal abdominal aortic  aneurysm which has been followed serially.  The aneurysm had increased  in size from the previous study, therefore a close 3-month followup  study was performed.  This was done on January 21, 2007, and it showed  stable dimensions of his infrarenal fusiform AAA.  The dimensions were  3.3 x 3.5-cm.  The common iliacs were normal in size with moderate  bilateral iliac stenosis, left greater than right.   From a symptomatic standpoint, [name] is doing well.  He has no  specific complaints today.  He denies chest pain, dyspnea, orthopnea,  PND, abdominal pain, palpitations, or other symptoms.   His medications are stable and include:  1. Lipitor 40 mg daily.  2. Toprol XL 50 mg daily.  3. Tarceva 150 mg daily.  4. Aspirin 81 mg daily.  5. Zoloft 50 mg daily.   PHYSICAL EXAMINATION:  He is alert and oriented in no acute distress.  Weight is 223 pounds, blood pressure is 150/90 in both arms, heart rate  is 72, respiratory rate is 16.  HEENT:  Normal.  NECK:  Normal carotid upstrokes without bruits.  Jugular venous pressure  is normal.  LUNGS:  Clear to auscultation bilaterally.  CARDIOVASCULAR:  The apex is discrete and nondisplaced.  HEART:  Regular rate and rhythm  without murmurs or gallops.  ABDOMEN:  Abdomen is soft, nontender, no organomegaly, no abdominal  bruits.  EXTREMITIES:  No clubbing, cyanosis, or edema.  Peripheral pulses are  intact and equal.   EKG shows normal sinus rhythm and is within normal limits.   ASSESSMENT:  [name] is currently stable from a cardiovascular  standpoint.  His cardiac issues are as follows:  1. Coronary artery disease.  He is stable without angina.  He remains      on a good regimen with Lipitor, Toprol, and aspirin.  He is      asymptomatic and there is no need for stress testing at this point.      See below for discussion of Toprol dose.  2. Hypertension.  In the setting of his coronary artery disease and      infrarenal abdominal aortic aneurysm, we need to be aggressive with      his blood pressure control.  I  have asked him to increase his      Toprol from 50 to 100 mg daily.  He will monitor his blood pressure      on an outpatient basis.  I suspect some of his blood pressure      increases secondary to weight gain.  He has gained a good bit of      weight over the past year and he thinks this is due to coming off      of oxycodone and other medications that have suppressed his      appetite over the last few years.  He is going to try and increase      his exercise routine.  3. Infrarenal abdominal aortic aneurysm.  Stable dimensions.  We will      follow up in one year with another ultrasound study.   For followup, I will see [name] back in six months or sooner if any  new problems arise.     Veverly Fells. Excell Seltzer, MD  Electronically Signed    MDC/MedQ  DD: 02/04/2007  DT: 02/04/2007  Job #: [id number]   cc:   Erskine Speed, M.D.

## 2010-12-27 NOTE — Assessment & Plan Note (Signed)
[facility]                            CARDIOLOGY OFFICE NOTE   NAME:[name], [name] KRONBERG                        MRN:          [id number]  DATE:07/26/2007                            DOB:          30-Dec-1950    [name] was seen in follow up at the [facility] Cardiology office on  July 26, 2007. [name] is a 60 year old gentleman with coronary  artery disease. He has had prior PCI with a bare metal stent in his  coronary artery and a Cypher drug-eluting stent in the left circumflex.  This was performed in 2004 by Dr. Samule Ohm. He has done very well from a  symptomatic standpoint since that time. [name] has lung cancer and  has been treated with radiation and left upper lobe resection. He has  been remarkably stable over the last four years and by his report has  had no progressive disease. He remains fairly active and has had no  problems with chest pain, dyspnea, orthopnea, PND, edema, light  headedness, or syncope. He also has a known infrarenal AAA that has been  followed with serial ultrasound.   CURRENT MEDICATIONS:  1. Toprol XL 100 mg daily.  2. Lipitor 40 mg daily.  3. Tarceva 150 mg daily.  4. Aspirin 81 mg daily.  5. Sertraline 100 mg daily.   ALLERGIES:  CONTRAST MEDIA.   PHYSICAL EXAMINATION:  GENERAL:  The patient is alert and oriented in no  acute distress.  VITAL SIGNS:  Weight 228 pounds, blood pressure 134/82 in the right arm  and 130/80 in the left arm, heart rate 76, respiratory rate 12.  HEENT:  Normal.  NECK:  Normal carotid upstrokes without bruits. Jugular venous pressure  is normal.  LUNGS:  Clear to auscultation bilaterally.  HEART:  Regular rate and rhythm without murmurs or gallops.  ABDOMEN:  Soft and nontender. No organomegaly.  EXTREMITIES:  No cyanosis, clubbing, or edema.   EKG shows normal sinus rhythm and is within normal limits.   ASSESSMENT:  1. Coronary artery disease. [name] is doing well and is    asymptomatic. Continue current medical therapy without changes.  2. Infrarenal abdominal aortic aneurysm. Abdominal ultrasound from      June 2008 showed stable dimensions of his AAA at 3.3 x 3.5      centimeters. He is due for a follow up scan in 1 year which will be      June 2009.  3. Hypertension. His blood pressure is under better control with a      increased dose of Toprol. He is tolerating this well without side      effects. Continue current therapy without changes.   For follow up, I would like to see [name] back in one year. If he has  problems in the interim, I would be happy to see him sooner.     Veverly Fells. Excell Seltzer, MD  Electronically Signed    MDC/MedQ  DD: 07/26/2007  DT: 07/28/2007  Job #: [id number]   cc:   Erskine Speed, M.D.

## 2010-12-30 NOTE — Cardiovascular Report (Signed)
NAME:  [name], [name] NO.:  [id number]   MEDICAL RECORD NO.:  [id number]                   PATIENT TYPE:  OIB   LOCATION:  6525                                 FACILITY:  [facility]   PHYSICIAN:  Salvadore Farber, M.D. [facility]         DATE OF BIRTH:  04/20/1951   DATE OF PROCEDURE:  08/29/2002  DATE OF DISCHARGE:                              CARDIAC CATHETERIZATION   PROCEDURES:  Coronary angiography, left ventriculography, left heart  catheterization, stent of proximal right coronary artery, stent to the mid  circumflex into second obtuse marginal branch, intravascular ultrasound of  the left anterior descending (per CETP study).   INDICATIONS:  The patient is a 59 year old gentleman with cardiac risk  factors of tobacco abuse, and dyslipidemia who presents with one-month of  exertional chest discomfort.  On exercise tolerance testing he had  diagnostic electrocardiographic changes in stage II of the standard Bruce  protocol.  He was therefore referred for diagnostic angiography.   DIAGNOSTIC TECHNIQUE:  Informed consent was obtained. Under 1% lidocaine  local anesthesia, a 6 French sheath was placed in the right femoral artery  using the modified Seldinger technique. Diagnostic angiography was performed  using JL5 and JR4 catheters.  Ventriculography and left heart  catheterization were performed using 6 French pigtail catheter.  The case  then proceeded to intervention.   DIAGNOSTIC FINDINGS:  1. LV:  EF 60% with moderate inferior hypokinesis.  2. No aortic stenosis or mitral regurgitation.  3. LV:  150/2/9.  4. Left main:  Angiographically normal.  5. LAD:  The LAD is a large vessel giving rise to three small diagonal     branches.  It is angiographically normal.  6. Circumflex:  The circumflex is a moderate sized vessel giving rise to a     small first and large second obtuse marginal branch.  There is an 80%     stenosis of the mid circumflex  extending into the proximal portion of OM-     2.  In addition, there is a 50% stenosis downstream in OM-2.  7. RCA:  There is a 70% stenosis of the proximal vessel and a 40% stenosis     of the mid vessel.   PERCUTANEOUS CORONARY INTERVENTION TECHNIQUE:  Anticoagulation was initiated  with heparin and Integrilin to achieve an ACT of greater than 200 seconds.  Attention was turned first to the RCA.  A 6 [place] guide was advanced  over a wire and engaged in the ostium of the right coronary artery.  A BMW  was advanced into the distal PDA.  The lesion was directly stented using a  4.0 x 12 mm Express delivered at 12 atmospheres.  The stent was then post-  dilated using a 4.0 x 12 mm Quantum balloon at 16 atmospheres.  Final  angiogram demonstrated no residual stenosis and TIMI-3 flow.   Attention was then turned to the  obtuse marginal.  A Q4 guide was advanced  over the ostium of the left main.  The guide seating was relatively poor but  adequate.  After being unable to pass a BMW and then luge wire, a Whisper  wire was advanced into the distal portion of OM-2.  The lesion was then pre-  dilated using a 2.0 x 15 mm Maverick at 6 atmospheres for two sequential  inflations.  The lesion was then stented using a 2.75 x 33 mm Cypher stent  delivered at 8 atmospheres (there was no 2.5 x 33 mm Cypher stent available,  so this stent was employed and deployed at approximately 2.5 mm diameter).  The entirety of the stent was then post-dilated using a 2.5 x 18 mm  PowerSail at 16 atmospheres over two sequential inflations.  Careful  attention was paid to ensuring that the entirety of the stent was post-  dilated. Final angiograms demonstrated no residual stenosis and TIMI-3 flow  to the distal vessel.   Finally, attention was turned to the LAD for intravascular ultrasound as  part of the CETP study.  The Whisper wire was withdrawn from the circumflex  and advanced into the LAD.  A Galileo IVUS  catheter was advanced over the  wire into the distal LAD.  Automated pullback was performed after the  administration of intracoronary nitroglycerin.  The patient tolerated the  procedure well.  Final angiograms demonstrated no change in the LAD anatomy.   COMPLICATIONS:  None.   RESULTS:  Successful stenting in the proximal right coronary artery and mid  circumflex extending into the second obtuse marginal branch. Both  interventions resulted in no residual stenosis and TIMI-3 flow to the distal  vessels.  The patient also underwent successful intravascular ultrasound of  the left anterior descending as part of the CETP study.   IMPRESSION/PLAN:  Successful stenting of proximal right coronary artery and  circumflex into obtuse marginal #2.  Aspirin will be continued indefinitely.  Plavix will be continued for a minimum of three months given the drug-  eluting stent in the circumflex territory.  Eptifibatide will be continued  for 18 hours.  The sheath will be removed when the ACT is less than 150.  Cholesterol management will be as per the CEPT protocol managed with our  research foundation.                                                       Salvadore Farber, M.D. [facility]    WED/MEDQ  D:  08/29/2002  T:  08/30/2002  Job:  [id number]   cc:   Erskine Speed, M.D.  [address], [address]  [place]  [place] [address]  Fax: [phone]

## 2010-12-30 NOTE — Discharge Summary (Signed)
[name], [name] NO.:  [id number]   MEDICAL RECORD NO.:  [id number]          PATIENT TYPE:  INP   LOCATION:  2008                         FACILITY:  [facility]   PHYSICIAN:  Ines Bloomer, M.D. 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   IN HOSPITAL DIAGNOSES:  In-hospital delirium.   SECONDARY DIAGNOSIS:  1.  Hypercholesterolemia.  2.  Coronary artery disease status post stent placed back in 2004.   ALLERGIES:  Patient states he is allergic to IV dye.   PROCEDURE PERFORMED:  Left VATS, wedge left upper lobe lesion, node  sampling.   HISTORY AND PHYSICAL:  [name] is a 60 year old patient who was diagnosed  as having stage IV non-small cell lung cancer in December 2005.  He  underwent radiation and chemotherapy initially with carboplatin and Paxil  followed by Novaldin and Gemzar in April 2006.  He was then placed on  Tarceva.  He originally was found to have metastatic disease to his right  8th and 9th ribs, though recent CT scans showed there was no uptake in the  8th and 9th rib with upper lobe lesion initially shrunk and started to  enlarge again.  He has had some discomfort in his right ribs.  He quit  smoking in January 2004 and his left upper lobe lesion was 11 mm in  diameter.  His pulmonary function tests showed an FEVC of 4.57 with FEV1 of  1.94 indicative of moderate obstructive disease.  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  He has had no hemoptysis or excessive sputum.  He is referred to Dr. Edwyna Shell for dissection.  For details of the patient's  past medical history and physical exam, please see the dictated history and  physical.   HOSPITAL COURSE:  [name] was taken to the operating room on Jan 10, 2005,  and underwent left  VATS, wedge left upper lobe lesion, and node sampling.  The patient tolerated the procedure well and was transferred to the  intensive care unit in stable condition.  Postoperative day one, the patient  was seen to be hemodynamically stable with a hemoglobin and hematocrit of 10  and 28.  He was saturating 95% on room air.  He did have a small air leak  noted in his chest tube.  The patient was seen to be in regular rate and  rhythm at that time.  Suction on the chest tubes was decreased at that time.  Postoperative day two, the patient was progressing well.  No air leak was  noted and chest x-ray was stable.  The patient's posterior chest tube was  discontinued at that time.  Early morning of postoperative day three, the  patient became disoriented and unaware of surroundings.  He was  hallucinating at that time.  He had pulled his chest tube out but no  respiratory distress following.  The patient had called 911 stating that he  was being kept and held against his will.  At that time, the patient was  monitored and the PCA was discontinued.  Later that morning, the patient was  more coherent.  On chest x-ray, there was noted to be a 30% pneumothorax  indicating the patient needed replacement of chest tube which was placed  later by Dr. Ezzard Standing.  Postoperative day four, the patient was more alert and  oriented.  His chest tube showed no air leak and chest x-ray showed the lung  expanding.  The patient had complained of diarrhea on postoperative day four  and stool was sent for C. diff toxin.  This came back negative.  The  patient's path report had come back showing adenocarcinoma, poorly  differentiated.  Margins were seen to be negative and lymph nodes negative.  On January 15, 2005, the patient was feeling better, alert and oriented.  No air  leak was noted and the patient's chest tube was discontinued.  He was out of  bed ambulating well.  Appetite improved.  He remained on room air  saturating  94-95%.  On postoperative day six, the patient was afebrile, hemodynamically  stable.  He was feeling better, ambulating well, confusion pretty much  completely improved.  The patient's chest x-ray was stable.  The patient was  discharged to home on postoperative day seven in stable condition.  X-ray  was stable on day seven.  A follow up appointment was scheduled with Dr.  Edwyna Shell for January 24, 2005, at 4:30 p.m.  The patient is to obtain a PA and  lateral chest x-ray one hour prior to this appointment.  [name] received  instructions on diet, activity level, and incisional care.  He was told no  driving until released to do so, no heavy lifting over 10 pounds.  He is  told he is allowed to shower washing his incisions using soap and water.  He  is to contact the office if he develops any drainage or openings from any of  his incision sites.  The patient acknowledged understanding.  He was told to  ambulate 3-4 times per day and progress as tolerated.  He was also told to  continue doing his breathing exercises.  The patient acknowledged  understanding.   DISCHARGE MEDICATIONS:  Toprol XL 50 mg p.o. daily, Effexor XR 75 mg p.o.  daily, aspirin 81 mg p.o. daily, Oxycodone IR 5 mg t.i.d., Oxycodone 20 mg  t.i.d., Tarceva 150 mg at night, Lipitor 40 mg at night, Ambien CR 12.5 mg  at night, Darvocet N100, 1-2 tabs p.o. q.4h. p.r.n. pain.      KMD/MEDQ  D:  01/16/2005  T:  01/16/2005  Job:  [id number]

## 2010-12-30 NOTE — Letter (Signed)
October 16, 2006    Erskine Speed, M.D.  [address], [address]  [place], [place] [address]   RE:  ALESSANDER, SIKORSKI  MRN:  [id number]  /  DOB:  [birth year]   Dear Renae Fickle:   It was a pleasure to see your nice patient, [name], for a followup  on October 16, 2006.  As you know, he is a very pleasant 60 year old white  married male, 4 years post stenting of the circumflex and right coronary  arteries using an express stent for the RCA, and a Cypher stent in the  circumflex.  The patient has gotten along quite well since that time.  He has had lung cancer, for which he received radiation and left upper  lobe resection.  He also has an abdominal aneurysm, which has increased  to 3.4 x 3.7 cm as of August 27, 2006.  This is increased from 3.1 x  3.2.   The patient is having no cardiac symptoms.   MEDICATIONS:  Include:  1. Lipitor 40.  2. Toprol XL 50.  3. Tarceva 150.  4. Baby aspirin 81.  5. Lunesta.  6. Zoloft.  7. Oxycodone.   Blood pressure is 147/94.  Repeat was 140/82 bilaterally.  GENERAL APPEARANCE:  Normal.  JVP is not elevated.  Carotid pulses are palpable without bruits.  LUNGS:  Clear.  CARDIAC:  Exam is normal.  No murmur or rub.  ABDOMINAL EXAM:  Unremarkable.  EXTREMITIES:  Normal.  EKG:  Reveals normal sinus rhythm, and is within normal limits.   IMPRESSION:  Diagnoses above.  Patient's cardiac status is quite stable.  His abdominal aneurysm did show significant increase, and I have  suggested repeating this in 3 months. .   I will have him see Dr. Tonny Bollman following that.   Thank you for the opportunity to share in this nice gentleman's care.    Sincerely,      E. Graceann Congress, MD, [facility]  Electronically Signed    EJL/MedQ  DD: 10/16/2006  DT: 10/16/2006  Job #: [id number]

## 2010-12-30 NOTE — Op Note (Signed)
[name], [name] NO.:  [id number]   MEDICAL RECORD NO.:  [id number]          PATIENT TYPE:  INP   LOCATION:  3307                         FACILITY:  [facility]   PHYSICIAN:  Ines Bloomer, M.D. DATE OF BIRTH:  [birth year]   DATE OF PROCEDURE:  01/10/2005  DATE OF DISCHARGE:                                 OPERATIVE REPORT   PREOPERATIVE DIAGNOSIS:  Stage 4 small cell lung cancer, status post  radiation and chemotherapy.   POSTOPERATIVE DIAGNOSIS:  Stage 4 small cell lung cancer, status post  radiation and chemotherapy.   OPERATION PERFORMED:  Left VATS, wedge left upper lobe lesion, node  sampling.   SURGEON:  Ines Bloomer, MD.   FIRST ASSISTANT:  Coral Ceo, PA-C.   This 60 year old male had had lung cancer for two years and presented with  stage 4 with right rib METS of the eighth and ninth ribs.  This was treated  with radiation.  He also had radiation and chemotherapy to the left apex  lesion in two different cycles of chemotherapy.  He was brought to the  operating room for resection of the residual lesion.  The PET scan was  negative except for the left upper lobe lesion, which had shrunk but was  starting to get larger.   He was placed in the left lateral thoracotomy position.  He was prepped and  draped in the usual manner.  A dual lumen tube was inserted.  Two trocar  sites were made in the anterior and posterior axilla at the seventh  intercostal space.  Two trocars were inserted.  A 0 degree scope was  inserted.  There were adhesions of the left upper lobe to the chest wall and  these were taken down with electrocautery.  A third incision was made over  the fourth intercostal space at the anterior axillary line going  posteriorly.  The interspace was opened and a small retractor was placed in  the interspace.  We could see that the lesion was stuck to the chest wall so  what we did was take the lesion off the chest wall with  electrocautery as  well as with an EZ-45 stapling device.  Because it was on top of the  subclavian artery, we first stapled through to the lobe leaving part of the  lung stuck to the chest wall.  Then we resected the apex of the lung coming  into the trocar site with three applications with the Ascon Echelon 60  stapler.  This lesion was removed and sent for frozen section and revealed a  non-small-cell lung cancer.  Then the residual lung tissue stuck to the  chest wall was dissected off under direct vision with the electrocautery and  sharp scissor dissection and sent for permanent section.  The area was  marked with clips and attention was turned to the inferior pulmonary  ligament, which was taken down with electrocautery under direct vision with  the scope, and then biopsies of a 5 node were done and then a 10-R node and  then an 11-R node.  These also had looked like they had been treated with  chemotherapy and/or radiation.  No other nodes were seen in the left chest.  A small, questionable nodule was seen in the lingula of the left lung and  this was resected with two applications of the EZ-45 stapler.  Two chest  tubes were brought in through the trocar sites and tied with __________.  A  Marcaine block was done under direct vision with  the thoracoscope.  Then the On-Q catheter was placed subpleurally with the  thoracoscope.  The chest was close with two pericostal's of #1 Vicryl in the  muscle layer and 3-0 Vicryl as a subcuticular stitch.  The patient was  returned to the recovery room in stable condition.      DPB/MEDQ  D:  01/10/2005  T:  01/10/2005  Job:  [id number]   cc:   2 copies to Dr. Scheryl Darter office

## 2010-12-30 NOTE — Letter (Signed)
April 23, 2006     Erskine Speed, M.D.  [address], [address]  [place], [place] [address]   RE:  [name], [name]  MRN:  [id number]  /  DOB:  [birth year]   Dear Renae Fickle:   It was a pleasure to see this patient, [name], for follow-up on  April 23, 2006.  He was noted to be a pleasant 60 year old white married  male three and a half years post stenting of his circumflex and right  coronary arteries.  He has had no recurrent symptoms.  He has had lung  cancer, having completed radiation chemotherapy and left upper lobe  resection.  His Port-A-Cath has now been removed and he is no longer taking  Coumadin.   MEDICATIONS:  1. Lipitor 40.  2. Toprol XL 50.  3. Tarceva 150.  4. Baby aspirin 81.  5. Protonix.  6. Lunesta 3.  7. Zoloft 150.  8. Oxycodone 20 t.i.d.   PHYSICAL EXAMINATION:  GENERAL APPEARANCE:  Normal.  VITAL SIGNS:  Blood pressure 152/84, pulse 59, normal sinus rhythm.  NECK:  JVP is not elevated.  Carotid pulses palpable and equal without  bruits.  LUNGS:  Clear.  CARDIOVASCULAR:  Unremarkable.  No murmur or gallop  ABDOMEN:  Unremarkable.  EXTREMITIES:  Normal.   IMPRESSION:  I am certainly pleased the patient is doing so well at this  time.  Blood pressure is not well controlled and he should probably be on  antihypertensive therapy.   Thank you for this opportunity to share in this nice patient.  He is to see  you in follow-up and I have asked to see him again in six months or at any  time you so desire.   ADDENDUM:  His EKG reveals sinus bradycardia, otherwise normal.    Sincerely,      E. Graceann Congress, MD, [facility]   EJL/MedQ  DD:  04/23/2006  DT:  04/24/2006  Job #:  [id number]

## 2010-12-30 NOTE — H&P (Signed)
NAMEKORDEL, LEAVY NO.:  [id number]   MEDICAL RECORD NO.:  [id number]          PATIENT TYPE:  INP   LOCATION:  NA                           FACILITY:  [facility]   PHYSICIAN:  Ines Bloomer, M.D. DATE OF BIRTH:  1951/07/14   DATE OF ADMISSION:  01/08/2005  DATE OF DISCHARGE:                                HISTORY & PHYSICAL   CHIEF COMPLAINT:  Lung mass.   HISTORY OF PRESENT ILLNESS:  This 60 year old patient was diagnosed as  having stage IV nonsmall cell lung cancer in December of 2005. He underwent  radiation and chemotherapy initially with carboplatin and Taxol followed by  Navelbine and Gemzar in April of 2006. He was then placed on Tarceva. He  originally was found to have metastatic disease to his right 8th and 9th  ribs but a recent CT scan showed there was no uptake in the 8th and 9th rib  but the left upper lobe lesion had initially shrunk and started to enlarge  again. He has had some discomfort in his right ribs.   He quit smoking in January of 2004 and his left upper lobe lesion was 11 mm  in diameter. His pulmonary function test showed a FEVC of 4.57 with a FEV1  of 1.94 indicative of moderate obstructive disease. 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 He has had no hemoptysis,  excessive sputum. He is referred for dissection.   ALLERGIES:  He is allergic to IV DYE and uses Benadryl as a premedication.   MEDICATIONS:  1.  Lipitor 40 mg daily.  2.  Toprol XL 50 mg daily.  3.  Tarceva 150 mg daily.  4.  Coumadin 1 mg a day.  5.  Ambien CR 12.5 mg daily.  6.  Effexor 150 mg daily.  7.  Oxycodone 20 mg t.i.d. as well as have oxycodone IR 5 mg t.i.d.  8.  Aspirin 81 mg daily.   PAST MEDICAL HISTORY:  1.  Hypercholesterolemia.  2.  He had a heart stent placed in 2004 for angina.   FAMILY HISTORY:  Positive for cardiac disease but negative for cancer.   SOCIAL HISTORY:  He is married and has one child. Works as a Surveyor, quantity. Does not drink alcohol on a regular basis.   REVIEW OF SYMPTOMS:  His weight is 180 pounds. He is 6 foot 1 inch. He has  had chronic right chest pain that requires narcotics on the right ribs. He  denies any recent angina or atrial fibrillation. Pulmonary shows he has had  no bronchitis, hemoptysis, or wheezing but some shortness of breath with  exertion. GI shows he has diarrhea that is secondary to his Tarceva. No  nausea or vomiting. GU shows he denies renal disease and frequent urination.  Vascular shows no history of claudication, DVT, or TIAs. Neurological shows  no history of black outs, seizures, or chronic headaches. Psychiatric shows  no recent depression. ENT shows no change in his eye sight or his hand.  Hematologic shows no anemia.   PHYSICAL EXAMINATION:  GENERAL:  He is a thin, Caucasian male in no acute  distress.  VITAL SIGNS:  Blood pressure is 118/80, pulse 68, respirations 18,  saturations are 98%.  HEENT:  Head is atraumatic. Eyes shows the pupils are equal and reactive to  light and accommodation. Ears show tympanic membranes are intact. Nose shows  no septal deviation. Mouth and throat is without lesion.  NECK:  Supple. There is no thyromegaly. No carotid bruits. No  supraclavicular axillary adenopathy.  CHEST:  Clear to auscultation. There is a right subclavian porta-a-Cath in  place.  HEART:  Regular sinus rhythm. No murmurs.  ABDOMEN:  Soft. Bowel sounds are normal. No hepatosplenomegaly. No palpable  masses.  EXTREMITIES:  Pulses are 2+. No clubbing or edema.  NEUROLOGICAL:  He is oriented x3. Cranial nerves II through XII intact.  Sensory and motor are intact.   IMPRESSION:  1.  Stage IV nonsmall cell lung cancer, status post chemotherapy.  2.  Enlarging left upper lobe lesion.  3.  Hypercholesterolemia.  4.  Chronic obstructive pulmonary disease.  5.  Chronic right chest wall pain.  6.  Coronary artery disease.   PLAN:  Left VATs resection  of left upper lobe lesion, emphysematous bulla  with no _____________.      DPB/MEDQ  D:  01/08/2005  T:  01/08/2005  Job:  [id number]

## 2010-12-30 NOTE — Discharge Summary (Signed)
NAME:  [name], [name] NO.:  [id number]   MEDICAL RECORD NO.:  [id number]                   PATIENT TYPE:  OIB   LOCATION:  6525                                 FACILITY:  [facility]   PHYSICIAN:  Learta Codding, M.D. [facility]             DATE OF BIRTH:  1950/10/25   DATE OF ADMISSION:  08/29/2002  DATE OF DISCHARGE:  08/30/2002                           DISCHARGE SUMMARY - REFERRING   DISCHARGE DIAGNOSES:  1. Coronary artery disease, status post stenting of the proximal right     coronary artery and mid circumflex on August 29, 2002.  2. Hyperlipidemia.  3. Family history of coronary artery disease.   HOSPITAL COURSE:  The patient is a 60 year old male patient who presented  with a one month history of exertional chest pressure resolving with rest.  He underwent a routine treadmill on August 27, 2002, and this did reveal  ischemia with exercise.  He was seen in the office on August 28, 2002, and  arrangements were made for cardiac catheterization the following day.   This procedure was performed by Dr. Randa Evens and revealed the  following:  He had 50% with inferior hypokinesis.  No mitral regurgitation  or AS noted.  There was a 70% proximal RCA stenosis followed by a 40% mid  lesion.  LAD revealed no angiographic abnormalities.  There was an 80%  stenosis of the OM-2 proximal, 80% that was reduced to 0% stenosis after a  CYPHER stent was placed.  There was also a 50% mid lesion.  The RCA also  underwent stent placement utilizing an Express stent reducing the 70% lesion  to a 0% lesion postprocedure.  The patient tolerated the procedure well, and  plans were made to keep the patient on aspirin indefinitely and Plavix for  three months.  The following morning the patient was stable and was thought  to be ready to go home.   LABORATORY DATA:  Hemoglobin 14.9, hematocrit 42.7, platelets 227, white  count 10.6.  BUN 9, creatinine 0.9, potassium 3.8,  sodium 134.  Triglycerides 199, cholesterol 230, HDL 35.   DISCHARGE MEDICATIONS:  1. Fish oil 1 g b.i.d.  2. Zocor 40 mg q. h.s.  3. Enteric-coated aspirin 325 mg daily.  4. Plavix 75 mg daily x3 months.  5. Toprol XL 50 mg one p.o. daily.  6. He may resume his glucose.  7. May remain on Nicorette.  8. May use laxative of choice.  9. Sublingual nitroglycerin p.r.n. chest pain.  10.      Tylenol 1-2 tablets q.4-6 h. p.r.n. for pain.   DISCHARGE INSTRUCTIONS:  No strenuous activity x2 days, then gradually  increase activity including driving.  Remain on a low-fat diet.  Clean the  catheterization site with soap and water.  Call for questions or concerns.    FOLLOWUP:  The office will call with a followup appointment in two  weeks.  At that point he will also need an abdominal ultrasound.  Concerning the  heart catheterization, abdominal aortogram showed diffuse lipid disease of  the distal aorta, and Dr. Samule Ohm felt that an ultrasound is necessary to  evaluate for AAA.     Gwendolyn Lima. Manson Passey, P.A.-C, [facility]                Learta Codding, M.D. [facility]    LDB/MEDQ  D:  08/30/2002  T:  08/30/2002  Job:  [id number]   cc:   Cecil Cranker, M.D. [facility]   Erskine Speed, M.D.  [address]  [place]  [place] [address]  Fax: [phone]

## 2011-01-03 ENCOUNTER — Other Ambulatory Visit: Payer: Self-pay | Admitting: Cardiovascular Disease

## 2011-01-03 DIAGNOSIS — I714 Abdominal aortic aneurysm, without rupture: Secondary | ICD-10-CM

## 2011-01-04 ENCOUNTER — Encounter (INDEPENDENT_AMBULATORY_CARE_PROVIDER_SITE_OTHER): Payer: Medicare Other | Admitting: Cardiology

## 2011-01-04 DIAGNOSIS — I714 Abdominal aortic aneurysm, without rupture, unspecified: Secondary | ICD-10-CM

## 2011-01-06 ENCOUNTER — Encounter: Payer: Self-pay | Admitting: Cardiovascular Disease

## 2011-06-15 ENCOUNTER — Encounter (HOSPITAL_BASED_OUTPATIENT_CLINIC_OR_DEPARTMENT_OTHER): Payer: Medicare Other | Admitting: Oncology

## 2011-06-15 ENCOUNTER — Other Ambulatory Visit: Payer: Self-pay | Admitting: Oncology

## 2011-06-15 ENCOUNTER — Ambulatory Visit (HOSPITAL_COMMUNITY)
Admission: RE | Admit: 2011-06-15 | Discharge: 2011-06-15 | Disposition: A | Discharge status: Home / Self Care | Payer: Medicare Other | Source: Ambulatory Visit | Attending: Oncology | Admitting: Oncology

## 2011-06-15 DIAGNOSIS — Z85118 Personal history of other malignant neoplasm of bronchus and lung: Secondary | ICD-10-CM | POA: Insufficient documentation

## 2011-06-15 DIAGNOSIS — C801 Malignant (primary) neoplasm, unspecified: Secondary | ICD-10-CM

## 2011-06-15 DIAGNOSIS — C3490 Malignant neoplasm of unspecified part of unspecified bronchus or lung: Secondary | ICD-10-CM

## 2011-06-15 DIAGNOSIS — Z923 Personal history of irradiation: Secondary | ICD-10-CM

## 2011-06-15 DIAGNOSIS — R0602 Shortness of breath: Secondary | ICD-10-CM | POA: Insufficient documentation

## 2011-06-15 DIAGNOSIS — Z79899 Other long term (current) drug therapy: Secondary | ICD-10-CM

## 2011-06-22 ENCOUNTER — Telehealth: Payer: Self-pay | Admitting: Oncology

## 2011-06-22 ENCOUNTER — Other Ambulatory Visit: Payer: Self-pay | Admitting: Oncology

## 2011-06-22 DIAGNOSIS — C3490 Malignant neoplasm of unspecified part of unspecified bronchus or lung: Secondary | ICD-10-CM

## 2011-06-22 NOTE — Telephone Encounter (Signed)
mailed pts appts for oct-nov2013 also his ct scan scheduled for 06/10/2012 @ 10am @ [facility].

## 2011-07-17 ENCOUNTER — Other Ambulatory Visit: Payer: Self-pay | Admitting: *Deleted

## 2011-07-17 DIAGNOSIS — C3490 Malignant neoplasm of unspecified part of unspecified bronchus or lung: Secondary | ICD-10-CM

## 2011-07-17 MED ORDER — ERLOTINIB HCL 150 MG PO TABS: 150.0000 mg | ORAL_TABLET | Freq: Every day | ORAL | Status: AC

## 2011-07-17 NOTE — Telephone Encounter (Signed)
RECEIVED A FAX FROM [facility] CONCERNING A PRESCRIPTION REFILL REQUEST FOR TARCEVA. THIS REQUEST WAS GIVEN TO DR.RUBIN'S NURSE, JAN MYERS,RN.

## 2011-08-14 ENCOUNTER — Encounter: Payer: Self-pay | Admitting: *Deleted

## 2011-08-14 NOTE — Progress Notes (Signed)
[facility] faxed confirmation of prescription shipment.  Shipped on 08-11-11 with next business day delivery.

## 2011-08-25 ENCOUNTER — Ambulatory Visit: Payer: Medicare Other | Admitting: Cardiovascular Disease

## 2011-09-07 ENCOUNTER — Telehealth: Payer: Self-pay

## 2011-09-07 NOTE — Telephone Encounter (Signed)
Received message from pt stating that he had his annual physical today, and was told by his PCP to check with this office to see if he could have the shingles vaccine.  Pt is on Tarceva 150 mg daily.  Spoke with Debbora Presto, PA, who states pt should not receive this vaccine, d/t it being a live vaccine and pt is on oral chemotherapy.  Called pt back [phone] to inform him of this, and he verbalizes understanding.

## 2011-09-13 ENCOUNTER — Ambulatory Visit: Payer: Medicare Other | Admitting: Cardiovascular Disease

## 2011-09-13 ENCOUNTER — Ambulatory Visit (INDEPENDENT_AMBULATORY_CARE_PROVIDER_SITE_OTHER): Payer: Medicare Other | Admitting: Cardiovascular Disease

## 2011-09-13 ENCOUNTER — Encounter: Payer: Self-pay | Admitting: Cardiovascular Disease

## 2011-09-13 VITALS — BP 146/78 | HR 76 | Ht 72.0 in | Wt 223.4 lb

## 2011-09-13 DIAGNOSIS — I2510 Atherosclerotic heart disease of native coronary artery without angina pectoris: Secondary | ICD-10-CM

## 2011-09-13 DIAGNOSIS — I10 Essential (primary) hypertension: Secondary | ICD-10-CM

## 2011-09-13 DIAGNOSIS — Z8679 Personal history of other diseases of the circulatory system: Secondary | ICD-10-CM

## 2011-09-13 NOTE — Patient Instructions (Signed)
Your physician wants you to follow-up in: 12 months. You will receive a reminder letter in the mail two months in advance. If you don't receive a letter, please call our office to schedule the follow-up appointment.  Your physician has requested that you have an abdominal aorta duplex. During this test, an ultrasound is used to evaluate the aorta. Allow 30 minutes for this exam. Do not eat after midnight the day before and avoid carbonated beverages. To be done in May.  Your physician recommends that you continue on your current medications as directed. Please refer to the Current Medication list given to you today.

## 2011-09-13 NOTE — Assessment & Plan Note (Signed)
The patient has an infrarenal abdominal aortic aneurysm, last measured in May 2012 with a maximum diameter of 3.2 cm. Recommend continue watch at one-year intervals

## 2011-09-13 NOTE — Assessment & Plan Note (Signed)
Blood pressure mildly elevated today but he reports that it was 130/80 last week at his physician's office visit. We'll continue to observe.

## 2011-09-13 NOTE — Progress Notes (Signed)
HPI:  This is a 61 year old gentleman presenting for followup evaluation. The patient has coronary artery disease status post remote PCI now approaching 10 years out from his procedure. He has a small abdominal aortic aneurysm that is followed yearly by serial ultrasound. The patient has been treated for lung cancer and he continues to take Tarceva.  From a symptomatic perspective, he is doing relatively well. He walks his dogs about 2 miles per day. He denies significant dyspnea, orthopnea, PND, edema, or palpitations. He does admit to occasional chest pain in the cold weather but this has not changed over the past few years.  Outpatient Encounter Prescriptions as of 09/13/2011  Medication Sig Dispense Refill  . aspirin 81 MG tablet Take 81 mg by mouth daily.      Marland Kitchen atorvastatin (LIPITOR) 40 MG tablet Take 40 mg by mouth daily.      Marland Kitchen glimepiride (AMARYL) 4 MG tablet Take 4 mg by mouth 2 (two) times daily.      . metFORMIN (GLUCOPHAGE) 850 MG tablet Take 850 mg by mouth daily.      . metoprolol (LOPRESSOR) 100 MG tablet Take 100 mg by mouth daily.      Marland Kitchen omeprazole (PRILOSEC) 20 MG capsule Take 20 mg by mouth daily.      Marland Kitchen erlotinib (TARCEVA) 150 MG tablet Take 150 mg by mouth daily. Take on an empty stomach 1 hour before meals or 2 hours after.        Allergies  Allergen Reactions  . Iohexol      Code: HIVES, Desc: pt recieves 50mg  benadryl 1 hour prior to scan     Past Medical History  Diagnosis Date  . lung ca dx'd 2004    chemo/xrt comp; tarceva ongoing  . Diabetes mellitus     ROS: Negative except as per HPI  BP 146/78  Pulse 76  Ht 6' (1.829 m)  Wt 101.334 kg (223 lb 6.4 oz)  BMI 30.30 kg/m2  PHYSICAL EXAM: Pt is alert and oriented, NAD HEENT: normal Neck: JVP - normal, carotids 2+= without bruits Lungs: CTA bilaterally CV: RRR without murmur or gallop Abd: soft, NT, Positive BS, no hepatomegaly Ext: no C/C/E, distal pulses intact and equal Skin: warm/dry no  rash  EKG:  Normal sinus rhythm 76 beats per minute, within normal limits.  ASSESSMENT AND PLAN:

## 2011-09-13 NOTE — Assessment & Plan Note (Signed)
The patient is stable with rare episodes of angina. We discussed the possibility of stress testing he was not inclined to pursue this at the present time. We'll continue his current medical program and see him back in one year. I would consider a stress test at that time and I advised him to contact me if there is any change in his symptom pattern.

## 2011-09-21 ENCOUNTER — Encounter: Payer: Self-pay | Admitting: *Deleted

## 2011-09-21 NOTE — Progress Notes (Signed)
ON 09/14/11 RECEIVED A FAX FROM [facility]ERNING A CONFIRMATION OF PRESCRIPTION SHIPMENT FOR TARCEVA.

## 2011-12-15 ENCOUNTER — Encounter: Payer: Self-pay | Admitting: *Deleted

## 2011-12-15 NOTE — Progress Notes (Signed)
RECEIVED A FAX FROM [facility]CERNING A CONFIRMATION OF PRESCRIPTION SHIPMENT FOR TARCEVA. 

## 2012-01-17 ENCOUNTER — Telehealth: Payer: Self-pay | Admitting: *Deleted

## 2012-01-17 NOTE — Telephone Encounter (Signed)
[facility] faxed confirmation of Tarceva prescription shipment.  Shipped prescription on 01-16-2012 with next business day delivery.

## 2012-02-12 ENCOUNTER — Other Ambulatory Visit: Payer: Self-pay | Admitting: *Deleted

## 2012-02-12 DIAGNOSIS — Z85118 Personal history of other malignant neoplasm of bronchus and lung: Secondary | ICD-10-CM

## 2012-02-12 MED ORDER — ERLOTINIB HCL 150 MG PO TABS: 150.0000 mg | ORAL_TABLET | Freq: Every day | ORAL | Status: DC

## 2012-02-12 NOTE — Telephone Encounter (Signed)
NOTIFIED [id number] THAT [facility] WILL BE REFILLING PT.'S TARCEVA. RECEIVED A FAX FROM [facility] CONCERNING A CONFIRMATION OF FACSIMILE RECEIPT.

## 2012-02-14 ENCOUNTER — Other Ambulatory Visit: Payer: Self-pay | Admitting: Cardiology

## 2012-02-14 ENCOUNTER — Telehealth: Payer: Self-pay | Admitting: *Deleted

## 2012-02-14 DIAGNOSIS — I714 Abdominal aortic aneurysm, without rupture: Secondary | ICD-10-CM

## 2012-02-14 NOTE — Telephone Encounter (Signed)
Received fax from [facility] that pt's Tarceva was shipped on 02/13/12, scheduled for next day delivery.

## 2012-02-19 ENCOUNTER — Encounter (INDEPENDENT_AMBULATORY_CARE_PROVIDER_SITE_OTHER): Payer: Medicare Other

## 2012-02-19 DIAGNOSIS — I714 Abdominal aortic aneurysm, without rupture: Secondary | ICD-10-CM

## 2012-03-15 ENCOUNTER — Telehealth: Payer: Self-pay | Admitting: Medical Oncology

## 2012-03-15 DIAGNOSIS — Z85118 Personal history of other malignant neoplasm of bronchus and lung: Secondary | ICD-10-CM

## 2012-03-15 MED ORDER — ERLOTINIB HCL 150 MG PO TABS: 150.0000 mg | ORAL_TABLET | Freq: Every day | ORAL | Status: DC

## 2012-03-15 NOTE — Telephone Encounter (Signed)
tarceva shipment confirmed

## 2012-04-11 ENCOUNTER — Other Ambulatory Visit: Payer: Self-pay | Admitting: *Deleted

## 2012-04-11 NOTE — Telephone Encounter (Signed)
THIS REFILL REQUEST FOR TARCEVA WAS GIVEN TO DR.RUBIN'S NURSE, MEREDITH WALTON,RN. 

## 2012-04-16 NOTE — Telephone Encounter (Signed)
RECEIVED A FAX FROM [facility] CONCERNING A FACSIMILE RECEIPT FOR PT. REFERRAL,

## 2012-04-17 ENCOUNTER — Telehealth: Payer: Self-pay | Admitting: *Deleted

## 2012-04-17 NOTE — Telephone Encounter (Signed)
[facility] faxed confirmation of prescription shipment.  Tarceva shipped 04-16-2012 with next business day delivery.

## 2012-05-16 ENCOUNTER — Encounter: Payer: Self-pay | Admitting: *Deleted

## 2012-05-16 NOTE — Progress Notes (Signed)
RECEIVED A FAX FROM [facility]ERNING A CONFIRMATION OF PRESCRIPTION SHIPMENT FOR TARCEVA ON 05/15/12.

## 2012-06-07 ENCOUNTER — Other Ambulatory Visit: Payer: Self-pay | Admitting: *Deleted

## 2012-06-07 DIAGNOSIS — Z85118 Personal history of other malignant neoplasm of bronchus and lung: Secondary | ICD-10-CM

## 2012-06-10 ENCOUNTER — Other Ambulatory Visit: Payer: Self-pay | Admitting: Oncology

## 2012-06-10 ENCOUNTER — Ambulatory Visit (HOSPITAL_COMMUNITY)
Admission: RE | Admit: 2012-06-10 | Discharge: 2012-06-10 | Disposition: A | Discharge status: Home / Self Care | Payer: Medicare Other | Source: Ambulatory Visit | Attending: Oncology | Admitting: Oncology

## 2012-06-10 ENCOUNTER — Other Ambulatory Visit (HOSPITAL_BASED_OUTPATIENT_CLINIC_OR_DEPARTMENT_OTHER): Payer: Medicare Other

## 2012-06-10 ENCOUNTER — Telehealth: Payer: Self-pay | Admitting: *Deleted

## 2012-06-10 DIAGNOSIS — J438 Other emphysema: Secondary | ICD-10-CM | POA: Insufficient documentation

## 2012-06-10 DIAGNOSIS — J984 Other disorders of lung: Secondary | ICD-10-CM | POA: Insufficient documentation

## 2012-06-10 DIAGNOSIS — Z85118 Personal history of other malignant neoplasm of bronchus and lung: Secondary | ICD-10-CM

## 2012-06-10 DIAGNOSIS — C3490 Malignant neoplasm of unspecified part of unspecified bronchus or lung: Secondary | ICD-10-CM | POA: Insufficient documentation

## 2012-06-10 DIAGNOSIS — I2510 Atherosclerotic heart disease of native coronary artery without angina pectoris: Secondary | ICD-10-CM | POA: Insufficient documentation

## 2012-06-10 DIAGNOSIS — R911 Solitary pulmonary nodule: Secondary | ICD-10-CM | POA: Insufficient documentation

## 2012-06-10 LAB — CBC WITH DIFFERENTIAL/PLATELET
Basophils Absolute: 0 10*3/uL (ref 0.0–0.1)
Eosinophils Absolute: 0.2 10*3/uL (ref 0.0–0.5)
HCT: 42.7 % (ref 38.4–49.9)
HGB: 15 g/dL (ref 13.0–17.1)
LYMPH%: 22 % (ref 14.0–49.0)
MCV: 93.4 fL (ref 79.3–98.0)
MONO%: 7.3 % (ref 0.0–14.0)
NEUT#: 5.7 10*3/uL (ref 1.5–6.5)
Platelets: 235 10*3/uL (ref 140–400)

## 2012-06-10 LAB — COMPREHENSIVE METABOLIC PANEL (CC13)
ALT: 33 U/L (ref 0–55)
BUN: 15 mg/dL (ref 7.0–26.0)
CO2: 23 mEq/L (ref 22–29)
Calcium: 9.7 mg/dL (ref 8.4–10.4)
Chloride: 106 mEq/L (ref 98–107)
Creatinine: 1.4 mg/dL — ABNORMAL HIGH (ref 0.7–1.3)

## 2012-06-10 MED ORDER — IOHEXOL 300 MG/ML  SOLN
80.0000 mL | Freq: Once | INTRAMUSCULAR | Status: AC | PRN
  Administered 2012-06-10: 80 mL via INTRAVENOUS

## 2012-06-10 NOTE — Telephone Encounter (Signed)
[name] with [facility] dept called asking if today's CT can be done without contrast.  Patient forgot to take Benadryl which he has to take before receiving contrast.  Instructed to reschedule.  [name] says it can be done before Friday's f/u.

## 2012-06-11 ENCOUNTER — Other Ambulatory Visit: Payer: Self-pay | Admitting: *Deleted

## 2012-06-11 DIAGNOSIS — Z85118 Personal history of other malignant neoplasm of bronchus and lung: Secondary | ICD-10-CM

## 2012-06-11 MED ORDER — ERLOTINIB HCL 150 MG PO TABS: 150.0000 mg | ORAL_TABLET | Freq: Every day | ORAL | Status: DC

## 2012-06-11 NOTE — Addendum Note (Signed)
Addended by: Arvilla Meres on: 06/11/2012 03:12 PM   Modules accepted: Orders

## 2012-06-11 NOTE — Telephone Encounter (Signed)
THIS REFILL REQUEST FOR TARCEVA WAS GIVEN TO DR.RUBIN'S NURSE, MEREDITH WALTON,RN.

## 2012-06-14 ENCOUNTER — Ambulatory Visit (HOSPITAL_BASED_OUTPATIENT_CLINIC_OR_DEPARTMENT_OTHER): Payer: Medicare Other | Admitting: Oncology

## 2012-06-14 ENCOUNTER — Telehealth: Payer: Self-pay | Admitting: *Deleted

## 2012-06-14 VITALS — BP 153/83 | HR 82 | Temp 97.9°F | Resp 20 | Ht 72.0 in | Wt 227.1 lb

## 2012-06-14 DIAGNOSIS — C3410 Malignant neoplasm of upper lobe, unspecified bronchus or lung: Secondary | ICD-10-CM

## 2012-06-14 DIAGNOSIS — C3490 Malignant neoplasm of unspecified part of unspecified bronchus or lung: Secondary | ICD-10-CM

## 2012-06-14 NOTE — Telephone Encounter (Signed)
Gave patient appointment for 06-20-2013 starting at 10:30am 

## 2012-06-14 NOTE — Progress Notes (Signed)
Hematology and Oncology Follow Up Visit  [name] [id number] 1951-05-21 61 y.o. 06/14/2012 9:20 AM   DIAGNOSIS:   1 stage IV non-small cell lung cancer, status post radiation with concurrent carboplatin and Taxol completed December 4 #2 recurrence of dominant documented April 2005 status post resection 01/11/2004 poorly differentiated left upper lobe cancer, status post Gemzar now been given actually comes for currently on Tarceva since 2005.  PAST THERAPY:  As above  Interim History:  Patient returns for followup. Doing well. He's had some low back pain sciatic-type discomfort. His weight is increased. He is not as active as he likely be. His other medical issues appear to be stable. There've been no other changes in his medications. His respiratory status is stable. He continues on Tarceva. He denies seeing any further rashes or other side effects from Tarceva.  Medications: I have reviewed the patient's current medications.  Allergies:  Allergies  Allergen Reactions  . Iohexol      Code: HIVES, Desc: pt recieves 50mg  benadryl 1 hour prior to scan     Past Medical History, Surgical history, Social history, and Family History were reviewed and updated.  Review of Systems: Constitutional:  Negative for fever, chills, night sweats, anorexia, weight loss, pain. Cardiovascular: no chest pain or dyspnea on exertion Respiratory: no cough, shortness of breath, or wheezing Neurological: negative Dermatological: negative ENT: negative Skin Gastrointestinal: negative Genito-Urinary: negative Hematological and Lymphatic: negative Breast: negative Musculoskeletal: negative Remaining ROS negative.  Physical Exam:  Blood pressure 153/83, pulse 82, temperature 97.9 F (36.6 C), temperature source Oral, resp. rate 20, height 6' (1.829 m), weight 227 lb 1.6 oz (103.012 kg).  ECOG: 0  Neck exam is unremarkable no oral lesions, lungs clear auscultation percussion cardiac exam  normal heart sounds no heaves thrills bruits or murmurs Abdominal exam normal bowel sounds no hepatosplenomegaly Extremities no cyanosis clubbing or edema No rashes    Lab Results: Lab Results  Component Value Date   WBC 8.5 06/10/2012   HGB 15.0 06/10/2012   HCT 42.7 06/10/2012   MCV 93.4 06/10/2012   PLT 235 06/10/2012     Chemistry      Component Value Date/Time   NA 138 06/10/2012 0900   NA 135 12/06/2010 0819   NA 138 06/07/2010 0814   K 4.8 06/10/2012 0900   K 4.4 12/06/2010 0819   K 4.7 06/07/2010 0814   CL 106 06/10/2012 0900   CL 97* 12/06/2010 0819   CL 103 06/07/2010 0814   CO2 23 06/10/2012 0900   CO2 26 12/06/2010 0819   CO2 31 06/07/2010 0814   BUN 15.0 06/10/2012 0900   BUN 17 12/06/2010 0819   BUN 14 06/07/2010 0814   CREATININE 1.4* 06/10/2012 0900   CREATININE 1.3* 12/06/2010 0819   CREATININE 1.64* 06/07/2010 0814      Component Value Date/Time   CALCIUM 9.7 06/10/2012 0900   CALCIUM 9.3 12/06/2010 0819   CALCIUM 10.0 06/07/2010 0814   ALKPHOS 64 06/10/2012 0900   ALKPHOS 92* 12/06/2010 0819   ALKPHOS 71 06/07/2010 0814   AST 20 06/10/2012 0900   AST 30 12/06/2010 0819   AST 26 06/07/2010 0814   ALT 33 06/10/2012 0900   ALT 39 06/07/2010 0814   BILITOT 1.00 06/10/2012 0900   BILITOT 1.10 12/06/2010 0819   BILITOT 1.7* 06/07/2010 0814       Radiological Studies:  Ct Chest W Contrast  06/10/2012  *RADIOLOGY REPORT*  Clinical Data: Lung cancer follow-up  CT CHEST WITH CONTRAST  Technique:  Multidetector CT imaging of the chest was performed following the standard protocol during bolus administration of intravenous contrast.  Contrast: 80mL OMNIPAQUE IOHEXOL 300 MG/ML  SOLN  Comparison: 12/06/2010  Findings: No enlarged axillary or supraclavicular adenopathy.  There is no mediastinal or hilar adenopathy.  Calcifications involving the LAD, left circumflex and RCA coronary arteries noted.  No pericardial effusion.  There is no pleural effusion  identified.  Changes of centrilobular emphysema identified.  Postoperative change from left upper lobe wedge resection surgery identified.  Nodular density along the left upper lobe suture line measures 1.2 cm, image 27.  This is unchanged from previous exam.  Tiny right upper lobe nodule is unchanged from previous study, image 25.  Right posterior pleural thickening and overlying rib deformities are identified, images 33 through 37.  This appears similar to previous exam.  Imaging through the upper abdomen is unremarkable.  The visualized portion of the adrenal glands appear normal.  IMPRESSION:  1.  Stable CT of the chest.  No specific features to suggest residual or recurrent tumor. 2.  Emphysema.   Original Report Authenticated By: Rosealee Albee, M.D.      IMPRESSIONS AND PLAN: A 61 y.o. male with   History of stage IV lung cancer diagnosis 2004 recurrence documented 2007 he appears to be free of disease based on most recent CT scan. His performance status is excellent. We have a long discussion about the possible discontinuation of Tarceva and I think it on bounce likely be best to continue this. I will continue to see him in an annual basis. He sees his primary care Dr. every 3-6 months. It might be a good idea for him to order a plain chest x-ray as well at that time. I will see me again with a followup CT scan. We also discussed his low back pain weight and believe exercise etc.  Spent more than half the time coordinating care, as well as discussion of BMI and its implications.      Fergie Sherbert 11/1/20139:20 AM Cell [id number]

## 2012-06-17 NOTE — Telephone Encounter (Signed)
RECEIVED A FAX FROM [facility] CONCERNING A CONFIRMATION OF PRESCRIPTION SHIPMENT FOR TARCEVA ON 06/14/12.

## 2012-07-16 ENCOUNTER — Encounter: Payer: Self-pay | Admitting: *Deleted

## 2012-07-16 NOTE — Progress Notes (Signed)
RECEIVED A FAX FROM [facility] CONCERNING A CONFIRMATION OF PRESCRIPTION SHIPMENT FOR TARCEVA.

## 2012-09-13 ENCOUNTER — Encounter: Payer: Self-pay | Admitting: Cardiovascular Disease

## 2012-09-13 ENCOUNTER — Ambulatory Visit (INDEPENDENT_AMBULATORY_CARE_PROVIDER_SITE_OTHER): Payer: Medicare Other | Admitting: Cardiovascular Disease

## 2012-09-13 VITALS — BP 152/87 | HR 62 | Ht 73.0 in | Wt 226.0 lb

## 2012-09-13 DIAGNOSIS — I714 Abdominal aortic aneurysm, without rupture, unspecified: Secondary | ICD-10-CM

## 2012-09-13 DIAGNOSIS — I10 Essential (primary) hypertension: Secondary | ICD-10-CM

## 2012-09-13 DIAGNOSIS — I2510 Atherosclerotic heart disease of native coronary artery without angina pectoris: Secondary | ICD-10-CM

## 2012-09-13 MED ORDER — LOSARTAN POTASSIUM 50 MG PO TABS: 50.0000 mg | ORAL_TABLET | Freq: Every day | ORAL | Status: DC

## 2012-09-13 NOTE — Patient Instructions (Addendum)
Your physician has requested that you have an abdominal aorta duplex in July 2014. During this test, an ultrasound is used to evaluate the aorta. Allow 30 minutes for this exam. Do not eat after midnight the day before and avoid carbonated beverages  Your physician has recommended you make the following change in your medication: START Losartan 50mg  take one by mouth daily  Your physician recommends that you return for lab work in: 2 WEEKS (BMP)  Your physician wants you to follow-up in: 1 YEAR with Dr Excell Seltzer. You will receive a reminder letter in the mail two months in advance. If you don't receive a letter, please call our office to schedule the follow-up appointment.

## 2012-09-13 NOTE — Progress Notes (Signed)
HPI:  62 year old gentleman presenting for followup evaluation. The patient has coronary artery disease and underwent PCI about 10 years ago. He's also followed for an abdominal aortic aneurysm. The patient has been treated for lung cancer and he remains on Tarceva. His last abdominal ultrasound was in July 2013 and the dimensions of his AAA were stable at 3.5 x 3.6 cm. Last CT scan of the chest was in October 2013 with no evidence of malignancy recurrence.  Since I saw him last year, he developed a sciatic nerve problem. His exercise is been very limited because of lingering pain in his back and leg. He's had some mild chest discomfort when he walks his dog and cold temperature. This is unchanged and he has not required any nitroglycerin. He otherwise feels well and has had no symptoms. He denies shortness of breath, edema, or palpitations.  Outpatient Encounter Prescriptions as of 09/13/2012  Medication Sig Dispense Refill  . aspirin 81 MG tablet Take 81 mg by mouth daily.      Marland Kitchen atorvastatin (LIPITOR) 40 MG tablet Take 40 mg by mouth daily.      Marland Kitchen erlotinib (TARCEVA) 150 MG tablet Take 1 tablet (150 mg total) by mouth daily. Take on an empty stomach 1 hour before meals or 2 hours after.  30 tablet  6  . glimepiride (AMARYL) 4 MG tablet Take 4 mg by mouth 2 (two) times daily.      . metFORMIN (GLUCOPHAGE) 850 MG tablet Take 850 mg by mouth daily.      . metoprolol (LOPRESSOR) 100 MG tablet Take 100 mg by mouth daily.      Marland Kitchen omeprazole (PRILOSEC) 20 MG capsule Take 20 mg by mouth daily.      . [DISCONTINUED] Glucosamine 750 MG TABS Take 2 each by mouth daily.        Allergies  Allergen Reactions  . Iohexol      Code: HIVES, Desc: pt recieves 50mg  benadryl 1 hour prior to scan     Past Medical History  Diagnosis Date  . lung ca dx'd 2004    chemo/xrt comp; tarceva ongoing  . Diabetes mellitus     ROS: Negative except as per HPI  BP 152/87  Pulse 62  Ht 6\' 1"  (1.854 m)  Wt  102.513 kg (226 lb)  BMI 29.82 kg/m2  PHYSICAL EXAM: Pt is alert and oriented, NAD HEENT: normal Neck: JVP - normal, carotids 2+= without bruits Lungs: CTA bilaterally CV: RRR without murmur or gallop Abd: soft, NT, Positive BS, no hepatomegaly Ext: no C/C/E, distal pulses intact and equal Skin: warm/dry no rash  EKG:  Normal sinus rhythm 76 beats per minute, within normal limits.  ASSESSMENT AND PLAN: 1. Coronary artery disease, native vessel. The patient is stable with very mild and rare anginal symptoms. He should continue on a combination of metoprolol succinate and atorvastatin. I am going to have close heart and 50 mg to his medical program considering his elevated blood pressure cuff CAD, and diabetes.  2. Essential hypertension. Blood pressure is elevated today and I have recommended losartan. We had a long discussion about his need for weight loss. I suspect with getting his weight down to about 200 pounds we would be over get him off of some of his blood pressure and diabetes medications.  3. Abdominal aortic aneurysm. Repeat abdominal duplex this summer at a one-year interval from his previous study.  For followup I will see him back in 12 months. Will  check a metabolic panel in 2 weeks to make sure that his kidney function and potassium are stable after starting losartan.  Tonny Bollman 09/13/2012 9:36 AM

## 2012-09-17 ENCOUNTER — Encounter: Payer: Self-pay | Admitting: *Deleted

## 2012-09-17 NOTE — Progress Notes (Signed)
RECEIVED A FAX FROM [facility] CONCERNING A CONFIRMATION OF PRESCRIPTION SHIPMENT FOR TARCEVA ON 09/13/12.

## 2012-09-27 ENCOUNTER — Other Ambulatory Visit: Payer: Medicare Other

## 2012-10-01 ENCOUNTER — Other Ambulatory Visit (INDEPENDENT_AMBULATORY_CARE_PROVIDER_SITE_OTHER): Payer: Medicare Other

## 2012-10-01 DIAGNOSIS — I714 Abdominal aortic aneurysm, without rupture, unspecified: Secondary | ICD-10-CM

## 2012-10-01 DIAGNOSIS — I10 Essential (primary) hypertension: Secondary | ICD-10-CM

## 2012-10-01 DIAGNOSIS — I2510 Atherosclerotic heart disease of native coronary artery without angina pectoris: Secondary | ICD-10-CM

## 2012-10-01 LAB — BASIC METABOLIC PANEL
CO2: 27 mEq/L (ref 19–32)
Calcium: 9.2 mg/dL (ref 8.4–10.5)
GFR: 58.2 mL/min — ABNORMAL LOW (ref >60.00)
Glucose, Bld: 107 mg/dL — ABNORMAL HIGH (ref 70–99)
Potassium: 4.6 mEq/L (ref 3.5–5.1)
Sodium: 138 mEq/L (ref 135–145)

## 2012-10-05 ENCOUNTER — Encounter: Payer: Self-pay | Admitting: Internal Medicine

## 2012-10-05 ENCOUNTER — Telehealth: Payer: Self-pay | Admitting: Internal Medicine

## 2012-10-05 NOTE — Telephone Encounter (Signed)
s.w. pt and advised on New Dr. Shirline Frees...sent pt letter and Nove appt schedule....former pt of Dr. Donnie Coffin

## 2012-10-14 ENCOUNTER — Encounter: Payer: Self-pay | Admitting: *Deleted

## 2012-10-14 NOTE — Progress Notes (Signed)
RECEIVED A FAX FROM [facility] CONCERNING A CONFIRMATION OF PRESCRIPTION SHIPMENT FOR TARCEVA ON 03-22-2050.

## 2012-11-14 ENCOUNTER — Encounter: Payer: Self-pay | Admitting: *Deleted

## 2012-11-14 NOTE — Progress Notes (Signed)
RECEIVED A FAX FROM [facility] CONCERNING A CONFIRMATION OF PRESCRIPTION SHIPMENT FOR TARCEVA ON 11/13/12.

## 2012-12-12 ENCOUNTER — Encounter: Payer: Self-pay | Admitting: *Deleted

## 2012-12-12 NOTE — Progress Notes (Signed)
RECEIVED A FAX FROM [facility] CONCERNING A CONFIRMATION OF PRESCRIPTION SHIPMENT FOR TARCEVA ON 12/11/12.

## 2013-01-07 ENCOUNTER — Other Ambulatory Visit: Payer: Self-pay | Admitting: Medical Oncology

## 2013-01-07 ENCOUNTER — Telehealth: Payer: Self-pay | Admitting: Medical Oncology

## 2013-01-07 ENCOUNTER — Other Ambulatory Visit: Payer: Self-pay | Admitting: *Deleted

## 2013-01-07 DIAGNOSIS — Z85118 Personal history of other malignant neoplasm of bronchus and lung: Secondary | ICD-10-CM

## 2013-01-07 NOTE — Telephone Encounter (Addendum)
Refill request to MD desk for review for Tarceva. Also received refill request from [facility]. Per collaborative nurse, MD will not fill until patient is seen in office by him. Script refill is on hold.

## 2013-01-07 NOTE — Telephone Encounter (Signed)
sw pt gv appt d/t for 01/08/13 @ 11:45am.the patient is aware...td

## 2013-01-08 ENCOUNTER — Encounter: Payer: Self-pay | Admitting: Internal Medicine

## 2013-01-08 ENCOUNTER — Other Ambulatory Visit: Payer: Self-pay | Admitting: Medical Oncology

## 2013-01-08 ENCOUNTER — Telehealth: Payer: Self-pay | Admitting: Internal Medicine

## 2013-01-08 ENCOUNTER — Ambulatory Visit (HOSPITAL_BASED_OUTPATIENT_CLINIC_OR_DEPARTMENT_OTHER): Payer: Medicare Other | Admitting: Internal Medicine

## 2013-01-08 VITALS — BP 137/87 | HR 82 | Temp 96.7°F | Resp 18 | Ht 73.0 in | Wt 220.6 lb

## 2013-01-08 DIAGNOSIS — Z85118 Personal history of other malignant neoplasm of bronchus and lung: Secondary | ICD-10-CM

## 2013-01-08 DIAGNOSIS — E119 Type 2 diabetes mellitus without complications: Secondary | ICD-10-CM

## 2013-01-08 MED ORDER — ERLOTINIB HCL 150 MG PO TABS: 150.0000 mg | ORAL_TABLET | Freq: Every day | ORAL | Status: DC

## 2013-01-08 NOTE — Telephone Encounter (Signed)
Saw Dr Arbutus Ped today and tarceva authorized. given to Kem Boroughs to fax

## 2013-01-08 NOTE — Progress Notes (Signed)
[facility] [phone]   [phone]  OFFICE PROGRESS NOTE  [name], [name] Ishihara, MD [address], [address] [place] [address]  DIAGNOSIS: Metastatic non-small cell lung cancer, adenocarcinoma initially diagnosed in December of 2004  PRIOR THERAPY: 1) status post course of concurrent chemoradiation under the care of Dr. Donnie Coffin. 2) status post left upper lobe wedge resection on 01/10/2005 for recurrent disease in the left lung.  CURRENT THERAPY: Tarceva 150 mg by mouth daily started in 2006.  INTERVAL HISTORY: [name] 62 y.o. male returns to the clinic today for followup visit. He is a former patient of Dr. Donnie Coffin who is currently here today to establish care with me after Dr. Donnie Coffin left the practice. The patient has been on Tarceva 150 mg by mouth daily for the last 8 years. He is tolerating it fairly well with no significant adverse effects except for there is a skin rash that had been every 2 months in addition to episodes of diarrhea every now and then. He was last seen by Dr. Donnie Coffin in November of 2013. The patient denied having any significant complaints today.  MEDICAL HISTORY: Past Medical History  Diagnosis Date  . lung ca dx'd 2004    chemo/xrt comp; tarceva ongoing  . Diabetes mellitus     ALLERGIES:  is allergic to iohexol.  MEDICATIONS:  Current Outpatient Prescriptions  Medication Sig Dispense Refill  . aspirin 81 MG tablet Take 81 mg by mouth daily.      Marland Kitchen atorvastatin (LIPITOR) 40 MG tablet Take 40 mg by mouth daily.      Marland Kitchen erlotinib (TARCEVA) 150 MG tablet Take 1 tablet (150 mg total) by mouth daily. Take on an empty stomach 1 hour before meals or 2 hours after.  30 tablet  6  . glimepiride (AMARYL) 4 MG tablet Take 4 mg by mouth 2 (two) times daily.      Marland Kitchen losartan (COZAAR) 50 MG tablet Take 1 tablet (50 mg total) by mouth daily.  90 tablet  3  . metFORMIN (GLUCOPHAGE) 850 MG tablet Take 850 mg by mouth daily.       . metoprolol succinate (TOPROL-XL) 100 MG 24 hr tablet Take 1 tablet (100 mg total) by mouth daily. Take with or immediately following a meal.  1 tablet  0  . omeprazole (PRILOSEC) 20 MG capsule Take 20 mg by mouth daily.       No current facility-administered medications for this visit.    REVIEW OF SYSTEMS:  A comprehensive review of systems was negative.   PHYSICAL EXAMINATION: General appearance: alert, cooperative and no distress Head: Normocephalic, without obvious abnormality, atraumatic Neck: no adenopathy Lymph nodes: Cervical, supraclavicular, and axillary nodes normal. Resp: clear to auscultation bilaterally Cardio: regular rate and rhythm, S1, S2 normal, no murmur, click, rub or gallop GI: soft, non-tender; bowel sounds normal; no masses,  no organomegaly Extremities: extremities normal, atraumatic, no cyanosis or edema Neurologic: Alert and oriented X 3, normal strength and tone. Normal symmetric reflexes. Normal coordination and gait  ECOG PERFORMANCE STATUS: 0 - Asymptomatic  Blood pressure 137/87, pulse 82, temperature 96.7 F (35.9 C), temperature source Oral, resp. rate 18, height 6\' 1"  (1.854 m), weight 220 lb 9.6 oz (100.064 kg).  LABORATORY DATA: Lab Results  Component Value Date   WBC 8.5 06/10/2012   HGB 15.0 06/10/2012   HCT 42.7 06/10/2012   MCV 93.4 06/10/2012   PLT 235 06/10/2012  Chemistry      Component Value Date/Time   NA 138 10/01/2012 1541   NA 138 06/10/2012 0900   NA 135 12/06/2010 0819   K 4.6 10/01/2012 1541   K 4.8 06/10/2012 0900   K 4.4 12/06/2010 0819   CL 106 10/01/2012 1541   CL 106 06/10/2012 0900   CL 97* 12/06/2010 0819   CO2 27 10/01/2012 1541   CO2 23 06/10/2012 0900   CO2 26 12/06/2010 0819   BUN 10 10/01/2012 1541   BUN 15.0 06/10/2012 0900   BUN 17 12/06/2010 0819   CREATININE 1.3 10/01/2012 1541   CREATININE 1.4* 06/10/2012 0900   CREATININE 1.3* 12/06/2010 0819      Component Value Date/Time   CALCIUM 9.2 10/01/2012  1541   CALCIUM 9.7 06/10/2012 0900   CALCIUM 9.3 12/06/2010 0819   ALKPHOS 64 06/10/2012 0900   ALKPHOS 92* 12/06/2010 0819   ALKPHOS 71 06/07/2010 0814   AST 20 06/10/2012 0900   AST 30 12/06/2010 0819   AST 26 06/07/2010 0814   ALT 33 06/10/2012 0900   ALT 39 06/07/2010 0814   BILITOT 1.00 06/10/2012 0900   BILITOT 1.10 12/06/2010 0819   BILITOT 1.7* 06/07/2010 0814       RADIOGRAPHIC STUDIES: No results found.  ASSESSMENT: This is a very pleasant 62 years old white male with history of metastatic non-small cell lung cancer, adenocarcinoma   PLAN: He is currently on treatment with Tarceva and tolerating it fairly well. I have a lengthy discussion with the patient about his condition. I recommended for him to have repeat CT scan of the chest performed in the next few days for restaging of his disease.  If the scan showed no evidence for disease progression, the patient will continue on Tarceva 150 mg by mouth daily. I would see him back for followup visit in 3 months for reevaluation with repeat blood work. He was advised to call immediately if he has any concerning symptoms in the interval.  All questions were answered. The patient knows to call the clinic with any problems, questions or concerns. We can certainly see the patient much sooner if necessary.  I spent 15 minutes counseling the patient face to face. The total time spent in the appointment was 25 minutes.

## 2013-01-08 NOTE — Addendum Note (Signed)
Addended by: Wandalee Ferdinand on: 01/08/2013 12:24 PM   Modules accepted: Orders

## 2013-01-08 NOTE — Telephone Encounter (Signed)
Patient came to appointment today. Tarceva refilled.

## 2013-01-08 NOTE — Telephone Encounter (Signed)
gv and printed appt sched for pt....pt aware cs will contact with d/t for ct

## 2013-01-08 NOTE — Patient Instructions (Signed)
I will repeat CT scan of the chest to be performed in the next few days. Followup visit in 3 months. Continue treatment with Tarceva

## 2013-01-09 ENCOUNTER — Encounter (HOSPITAL_COMMUNITY): Payer: Self-pay

## 2013-01-09 ENCOUNTER — Ambulatory Visit (HOSPITAL_COMMUNITY)
Admission: RE | Admit: 2013-01-09 | Discharge: 2013-01-09 | Disposition: A | Discharge status: Home / Self Care | Payer: Medicare Other | Source: Ambulatory Visit | Attending: Internal Medicine | Admitting: Internal Medicine

## 2013-01-09 DIAGNOSIS — M954 Acquired deformity of chest and rib: Secondary | ICD-10-CM | POA: Insufficient documentation

## 2013-01-09 DIAGNOSIS — Z79899 Other long term (current) drug therapy: Secondary | ICD-10-CM | POA: Insufficient documentation

## 2013-01-09 DIAGNOSIS — R911 Solitary pulmonary nodule: Secondary | ICD-10-CM | POA: Insufficient documentation

## 2013-01-09 DIAGNOSIS — C3490 Malignant neoplasm of unspecified part of unspecified bronchus or lung: Secondary | ICD-10-CM | POA: Insufficient documentation

## 2013-01-09 DIAGNOSIS — Z85118 Personal history of other malignant neoplasm of bronchus and lung: Secondary | ICD-10-CM

## 2013-01-09 DIAGNOSIS — J438 Other emphysema: Secondary | ICD-10-CM | POA: Insufficient documentation

## 2013-01-09 DIAGNOSIS — Z923 Personal history of irradiation: Secondary | ICD-10-CM | POA: Insufficient documentation

## 2013-01-10 NOTE — Telephone Encounter (Signed)
RECEIVED A FAX FROM [facility] CONCERNING A CONFIRMATION OF PRESCRIPTION SHIPMENT FOR TARCEVA ON 01/09/13.

## 2013-01-31 ENCOUNTER — Telehealth: Payer: Self-pay | Admitting: *Deleted

## 2013-01-31 NOTE — Telephone Encounter (Signed)
Most recent CT scan and office visit routed to Dr Chilton Si per pt request.  Allison Quarry

## 2013-02-04 ENCOUNTER — Telehealth: Payer: Self-pay | Admitting: Internal Medicine

## 2013-02-04 NOTE — Telephone Encounter (Signed)
returned pt call and advised that i needed info for what he needed to r/s

## 2013-02-07 ENCOUNTER — Other Ambulatory Visit: Payer: Self-pay | Admitting: *Deleted

## 2013-02-07 DIAGNOSIS — Z85118 Personal history of other malignant neoplasm of bronchus and lung: Secondary | ICD-10-CM

## 2013-02-07 MED ORDER — ERLOTINIB HCL 150 MG PO TABS: 150.0000 mg | ORAL_TABLET | Freq: Every day | ORAL | Status: DC

## 2013-02-07 NOTE — Addendum Note (Signed)
Addended by: Wandalee Ferdinand on: 02/07/2013 11:00 AM   Modules accepted: Orders

## 2013-02-07 NOTE — Telephone Encounter (Signed)
Refill request to MD desk for approval. 

## 2013-02-10 NOTE — Telephone Encounter (Signed)
RECEIVED A FAX FROM [facility] CONCERNING A CONFIRMATION OF PRESCRIPTION SHIPMENT FOR TARCEVA ON 02/07/13.

## 2013-02-24 ENCOUNTER — Encounter (INDEPENDENT_AMBULATORY_CARE_PROVIDER_SITE_OTHER): Payer: Medicare Other

## 2013-02-24 DIAGNOSIS — I2510 Atherosclerotic heart disease of native coronary artery without angina pectoris: Secondary | ICD-10-CM

## 2013-02-24 DIAGNOSIS — I714 Abdominal aortic aneurysm, without rupture: Secondary | ICD-10-CM

## 2013-02-24 DIAGNOSIS — I700 Atherosclerosis of aorta: Secondary | ICD-10-CM

## 2013-02-24 DIAGNOSIS — I10 Essential (primary) hypertension: Secondary | ICD-10-CM

## 2013-03-12 NOTE — Telephone Encounter (Signed)
[facility] sent facsimile confirmation of Tarceva prescription shipment.  Tarceva was shipped 03/11/2013 with next business day delivery.

## 2013-04-07 ENCOUNTER — Telehealth: Payer: Self-pay | Admitting: Internal Medicine

## 2013-04-07 NOTE — Telephone Encounter (Signed)
retured pt call and lvm advised on 8.26.14 appts.Marland KitchenMarland Kitchen

## 2013-04-08 ENCOUNTER — Other Ambulatory Visit: Payer: Medicare Other | Admitting: Lab

## 2013-04-08 ENCOUNTER — Ambulatory Visit (HOSPITAL_BASED_OUTPATIENT_CLINIC_OR_DEPARTMENT_OTHER): Payer: Medicare Other | Admitting: Physician Assistant

## 2013-04-08 ENCOUNTER — Ambulatory Visit: Payer: Medicare Other | Admitting: Internal Medicine

## 2013-04-08 ENCOUNTER — Other Ambulatory Visit (HOSPITAL_BASED_OUTPATIENT_CLINIC_OR_DEPARTMENT_OTHER): Payer: Medicare Other | Admitting: Lab

## 2013-04-08 VITALS — BP 147/85 | HR 77 | Temp 98.1°F | Resp 20 | Ht 73.0 in | Wt 222.7 lb

## 2013-04-08 DIAGNOSIS — C3410 Malignant neoplasm of upper lobe, unspecified bronchus or lung: Secondary | ICD-10-CM

## 2013-04-08 DIAGNOSIS — Z85118 Personal history of other malignant neoplasm of bronchus and lung: Secondary | ICD-10-CM

## 2013-04-08 DIAGNOSIS — R197 Diarrhea, unspecified: Secondary | ICD-10-CM

## 2013-04-08 LAB — CBC WITH DIFFERENTIAL/PLATELET
Basophils Absolute: 0 10*3/uL (ref 0.0–0.1)
Eosinophils Absolute: 0.1 10*3/uL (ref 0.0–0.5)
HGB: 14.3 g/dL (ref 13.0–17.1)
MONO#: 0.7 10*3/uL (ref 0.1–0.9)
NEUT#: 4.7 10*3/uL (ref 1.5–6.5)
Platelets: 214 10*3/uL (ref 140–400)
RBC: 4.52 10*6/uL (ref 4.20–5.82)
RDW: 14.1 % (ref 11.0–14.6)
WBC: 7.6 10*3/uL (ref 4.0–10.3)

## 2013-04-08 LAB — COMPREHENSIVE METABOLIC PANEL (CC13)
ALT: 35 U/L (ref 0–55)
AST: 26 U/L (ref 5–34)
Albumin: 3.8 g/dL (ref 3.5–5.0)
Alkaline Phosphatase: 61 U/L (ref 40–150)
BUN: 10.2 mg/dL (ref 7.0–26.0)
Potassium: 4.8 mEq/L (ref 3.5–5.1)

## 2013-04-11 ENCOUNTER — Encounter: Payer: Self-pay | Admitting: *Deleted

## 2013-04-11 NOTE — Progress Notes (Signed)
[facility] [phone]   Fax:([phone]  OFFICE PROGRESS NOTE  GREEN, [name], MD [address], [address] [place] [address]  DIAGNOSIS: Metastatic non-small cell lung cancer, adenocarcinoma initially diagnosed in December of 2004  PRIOR THERAPY: 1) status post course of concurrent chemoradiation under the care of Dr. Donnie Coffin. 2) status post left upper lobe wedge resection on 01/10/2005 for recurrent disease in the left lung.  CURRENT THERAPY: Tarceva 150 mg by mouth daily started in 2006.  DISEASE STAGE: Stage IV  CHEMOTHERAPY INTENT: Palliative  CURRENT # OF CHEMOTHERAPY CYCLES: 8 years  CURRENT ANTIEMETICS: none  CURRENT SMOKING STATUS: Former smoker, quit January 2004  ORAL CHEMOTHERAPY AND CONSENT: Yes  CURRENT BISPHOSPHONATES USE: None  PAIN MANAGEMENT: None  NARCOTICS INDUCED CONSTIPATION: None  LIVING WILL AND CODE STATUS: Patient states that he requested a DO NOT RESUSCITATE when he was diagnosed in 2004 however now that he is 10 years out from diagnosis he would like to change this to limited code.   INTERVAL HISTORY: [name] 62 y.o. male returns to the clinic today for followup visit. The patient has been on Tarceva 150 mg by mouth daily for the last 8 years. He is tolerating it fairly well with no significant adverse effects except for there is a skin rash that had been every 2 months in addition to episodes of diarrhea every now and then. He reports that the diarrhea is at shortly bit more problematic than he had mentioned in the past. When it does occur he tends to "let it run its course" and has not really used Imodium to the fullest. He states he is at a point his life where he would like to travel with his wife but the diarrhea keeps him pretty close to home. The patient denied having any significant complaints today. 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  MEDICAL HISTORY: Past  Medical History  Diagnosis Date  . Diabetes mellitus   . lung ca dx'd 2004    chemo/xrt comp; tarceva ongoing    ALLERGIES:  is allergic to iohexol.  MEDICATIONS:  Current Outpatient Prescriptions  Medication Sig Dispense Refill  . aspirin 81 MG tablet Take 81 mg by mouth daily.      Marland Kitchen atorvastatin (LIPITOR) 40 MG tablet Take 40 mg by mouth daily.      Marland Kitchen erlotinib (TARCEVA) 150 MG tablet Take 1 tablet (150 mg total) by mouth daily. Take on an empty stomach 1 hour before meals or 2 hours after.  30 tablet  2  . glimepiride (AMARYL) 4 MG tablet Take 4 mg by mouth 2 (two) times daily.      Marland Kitchen losartan (COZAAR) 50 MG tablet Take 1 tablet (50 mg total) by mouth daily.  90 tablet  3  . metFORMIN (GLUCOPHAGE) 850 MG tablet Take 850 mg by mouth daily.      . metoprolol succinate (TOPROL-XL) 100 MG 24 hr tablet Take 1 tablet (100 mg total) by mouth daily. Take with or immediately following a meal.  1 tablet  0  . omeprazole (PRILOSEC) 20 MG capsule Take 20 mg by mouth daily.       No current facility-administered medications for this visit.    REVIEW OF SYSTEMS:  A comprehensive review of systems was negative except for: Gastrointestinal: positive for diarrhea   PHYSICAL EXAMINATION: General appearance: alert, cooperative and no distress Head: Normocephalic, without obvious abnormality,  atraumatic Neck: no adenopathy Lymph nodes: Cervical, supraclavicular, and axillary nodes normal. Resp: clear to auscultation bilaterally Cardio: regular rate and rhythm, S1, S2 normal, no murmur, click, rub or gallop GI: soft, non-tender; bowel sounds normal; no masses,  no organomegaly Extremities: extremities normal, atraumatic, no cyanosis or edema Neurologic: Alert and oriented X 3, normal strength and tone. Normal symmetric reflexes. Normal coordination and gait  ECOG PERFORMANCE STATUS: 0 - Asymptomatic  Blood pressure 147/85, pulse 77, temperature 98.1 F (36.7 C), temperature source Oral, resp.  rate 20, height 6\' 1"  (1.854 m), weight 222 lb 11.2 oz (101.016 kg).  LABORATORY DATA: Lab Results  Component Value Date   WBC 7.6 04/08/2013   HGB 14.3 04/08/2013   HCT 40.7 04/08/2013   MCV 90.0 04/08/2013   PLT 214 04/08/2013      Chemistry      Component Value Date/Time   NA 141 04/08/2013 1424   NA 138 10/01/2012 1541   NA 135 12/06/2010 0819   K 4.8 04/08/2013 1424   K 4.6 10/01/2012 1541   K 4.4 12/06/2010 0819   CL 106 10/01/2012 1541   CL 106 06/10/2012 0900   CL 97* 12/06/2010 0819   CO2 24 04/08/2013 1424   CO2 27 10/01/2012 1541   CO2 26 12/06/2010 0819   BUN 10.2 04/08/2013 1424   BUN 10 10/01/2012 1541   BUN 17 12/06/2010 0819   CREATININE 1.3 04/08/2013 1424   CREATININE 1.3 10/01/2012 1541   CREATININE 1.3* 12/06/2010 0819      Component Value Date/Time   CALCIUM 9.4 04/08/2013 1424   CALCIUM 9.2 10/01/2012 1541   CALCIUM 9.3 12/06/2010 0819   ALKPHOS 61 04/08/2013 1424   ALKPHOS 92* 12/06/2010 0819   ALKPHOS 71 06/07/2010 0814   AST 26 04/08/2013 1424   AST 30 12/06/2010 0819   AST 26 06/07/2010 0814   ALT 35 04/08/2013 1424   ALT 55* 12/06/2010 0819   ALT 39 06/07/2010 0814   BILITOT 1.01 04/08/2013 1424   BILITOT 1.10 12/06/2010 0819   BILITOT 1.7* 06/07/2010 0814       RADIOGRAPHIC STUDIES:  No results found.  ASSESSMENT/PLAN: This is a very pleasant 62 years old white male with history of metastatic non-small cell lung cancer, adenocarcinoma. He is currently on treatment with Tarceva and tolerating it fairly well. His chest CT without contrast from 01/09/2013 revealed a stable 12 mm nodule in the posterior left upper lobe and no new disease or evidence for disease metastasis. The patient will continue on Tarceva at 150 mg by mouth daily. He will followup with Dr. Arbutus Ped in 3 months with a chest x-ray. The plan will be to do a CT of the chest once a year and 6 months later do a chest x-ray to follow his disease. We discussed CODE STATUS and the patient stated that when  he was initially diagnosed and underwent surgery he requested a DO NOT RESUSCITATE status. He is now reconsidering this now that he is 8 years out from diagnosis in May 1 a Limited CODE STATUS in place. I've given him contact information for our social worker Lauren McMullen's to review/complete new living will paperwork.  Koichi Platte E, PA-C   He was advised to call immediately if he has any concerning symptoms in the interval.  All questions were answered. The patient knows to call the clinic with any problems, questions or concerns. We can certainly see the patient much sooner if necessary.  I spent  20 minutes counseling the patient face to face. The total time spent in the appointment was 30 minutes.

## 2013-04-11 NOTE — Progress Notes (Signed)
Confirmation of Tarceva shipment from [facility]. Shipped 04/10/13

## 2013-04-11 NOTE — Patient Instructions (Addendum)
Continue Tarceva 150 mg by mouth daily Follow up with Dr. Arbutus Ped in 3 months with a CXR to re-evaluate your disease

## 2013-04-13 NOTE — Progress Notes (Signed)
The patient is seen by Tiana Loft, PA-C

## 2013-04-16 ENCOUNTER — Telehealth: Payer: Self-pay | Admitting: Internal Medicine

## 2013-04-16 NOTE — Telephone Encounter (Signed)
S/w pt re appts for 11/25 - cxr 10am - lb 11am and MM 11:30am.

## 2013-05-12 ENCOUNTER — Telehealth: Payer: Self-pay | Admitting: *Deleted

## 2013-05-12 NOTE — Telephone Encounter (Signed)
THIS REFILL REQUEST FOR TARCEVA WAS GIVEN TO DR.MOHAMED'S NURSE, DIANE BELL,RN. 

## 2013-05-13 ENCOUNTER — Telehealth: Payer: Self-pay | Admitting: Medical Oncology

## 2013-05-13 DIAGNOSIS — Z85118 Personal history of other malignant neoplasm of bronchus and lung: Secondary | ICD-10-CM

## 2013-05-13 MED ORDER — ERLOTINIB HCL 150 MG PO TABS: 150.0000 mg | ORAL_TABLET | Freq: Every day | ORAL | Status: DC

## 2013-05-13 NOTE — Telephone Encounter (Signed)
tarceva order faxed.

## 2013-05-14 ENCOUNTER — Telehealth: Payer: Self-pay | Admitting: Medical Oncology

## 2013-05-14 NOTE — Telephone Encounter (Signed)
Olegario Messier from [facility] called and said Tarceva needs prior auth which she will do but needs records so I faxed her pts last 2 office notes.

## 2013-05-16 NOTE — Telephone Encounter (Signed)
RECEIVED A FAX FROM [facility] CONCERNING A CONFIRMATION OF PRESCRIPTION SHIPMENT FOR TARCEVA ON 2050-07-27.

## 2013-06-13 ENCOUNTER — Encounter: Payer: Self-pay | Admitting: *Deleted

## 2013-06-13 NOTE — Progress Notes (Signed)
RECEIVED A FAX FROM [facility] CONCERNING A CONFIRMATION OF PRESCRIPTION SHIPMENT FOR TARCEVA ON 06/12/13.

## 2013-06-16 ENCOUNTER — Other Ambulatory Visit (HOSPITAL_COMMUNITY): Payer: Medicare Other

## 2013-06-16 ENCOUNTER — Other Ambulatory Visit: Payer: Medicare Other | Admitting: Lab

## 2013-06-20 ENCOUNTER — Other Ambulatory Visit: Payer: Medicare Other | Admitting: Lab

## 2013-06-20 ENCOUNTER — Other Ambulatory Visit: Payer: Medicare Other

## 2013-06-20 ENCOUNTER — Ambulatory Visit: Payer: Medicare Other | Admitting: Oncology

## 2013-06-23 ENCOUNTER — Ambulatory Visit: Payer: Medicare Other | Admitting: Internal Medicine

## 2013-07-04 ENCOUNTER — Other Ambulatory Visit (HOSPITAL_COMMUNITY): Payer: Medicare Other

## 2013-07-08 ENCOUNTER — Other Ambulatory Visit (HOSPITAL_BASED_OUTPATIENT_CLINIC_OR_DEPARTMENT_OTHER): Payer: Medicare Other | Admitting: Lab

## 2013-07-08 ENCOUNTER — Ambulatory Visit (HOSPITAL_COMMUNITY)
Admission: RE | Admit: 2013-07-08 | Discharge: 2013-07-08 | Disposition: A | Discharge status: Home / Self Care | Payer: Medicare Other | Source: Ambulatory Visit | Attending: Physician Assistant | Admitting: Physician Assistant

## 2013-07-08 ENCOUNTER — Ambulatory Visit (HOSPITAL_BASED_OUTPATIENT_CLINIC_OR_DEPARTMENT_OTHER): Payer: Medicare Other | Admitting: Internal Medicine

## 2013-07-08 ENCOUNTER — Encounter: Payer: Self-pay | Admitting: Internal Medicine

## 2013-07-08 VITALS — BP 136/73 | HR 77 | Temp 98.0°F | Resp 18 | Ht 73.0 in | Wt 225.2 lb

## 2013-07-08 DIAGNOSIS — C3410 Malignant neoplasm of upper lobe, unspecified bronchus or lung: Secondary | ICD-10-CM

## 2013-07-08 DIAGNOSIS — Z85118 Personal history of other malignant neoplasm of bronchus and lung: Secondary | ICD-10-CM | POA: Insufficient documentation

## 2013-07-08 DIAGNOSIS — Z09 Encounter for follow-up examination after completed treatment for conditions other than malignant neoplasm: Secondary | ICD-10-CM | POA: Insufficient documentation

## 2013-07-08 LAB — CBC WITH DIFFERENTIAL/PLATELET
Basophils Absolute: 0.1 10*3/uL (ref 0.0–0.1)
EOS%: 2.4 % (ref 0.0–7.0)
HCT: 43.8 % (ref 38.4–49.9)
HGB: 15 g/dL (ref 13.0–17.1)
MCH: 31.6 pg (ref 27.2–33.4)
MCV: 92.2 fL (ref 79.3–98.0)
MONO%: 9 % (ref 0.0–14.0)
NEUT%: 67.5 % (ref 39.0–75.0)
RDW: 14.9 % — ABNORMAL HIGH (ref 11.0–14.6)

## 2013-07-08 LAB — COMPREHENSIVE METABOLIC PANEL (CC13)
AST: 24 U/L (ref 5–34)
Alkaline Phosphatase: 64 U/L (ref 40–150)
BUN: 14.8 mg/dL (ref 7.0–26.0)
Creatinine: 1.4 mg/dL — ABNORMAL HIGH (ref 0.7–1.3)
Total Bilirubin: 0.88 mg/dL (ref 0.20–1.20)

## 2013-07-08 NOTE — Patient Instructions (Signed)
Followup visit in 3 months with repeat CT scan of the chest. 

## 2013-07-08 NOTE — Progress Notes (Signed)
[facility] Telephone:([phone]   Fax:([phone]  OFFICE PROGRESS NOTE  GREEN, [name], MD [address], [address] [place] [address]  DIAGNOSIS: Metastatic non-small cell lung cancer, adenocarcinoma initially diagnosed in December of 2004  PRIOR THERAPY: 1) status post course of concurrent chemoradiation under the care of Dr. Donnie Coffin. 2) status post left upper lobe wedge resection on 01/10/2005 for recurrent disease in the left lung.  CURRENT THERAPY: Tarceva 150 mg by mouth daily started in 2006.  DISEASE STAGE: Stage IV  CHEMOTHERAPY INTENT: Palliative  CURRENT # OF CHEMOTHERAPY CYCLES: 8 years  CURRENT ANTIEMETICS: none  CURRENT SMOKING STATUS: Former smoker, quit January 2004  ORAL CHEMOTHERAPY AND CONSENT: Yes  CURRENT BISPHOSPHONATES USE: None  PAIN MANAGEMENT: None  NARCOTICS INDUCED CONSTIPATION: None  LIVING WILL AND CODE STATUS: Patient states that he requested a DO NOT RESUSCITATE when he was diagnosed in 2004 however now that he is 10 years out from diagnosis he would like to change this to limited code.   INTERVAL HISTORY: [name] 62 y.o. male returns to the clinic today for followup visit. The patient has been on Tarceva 150 mg by mouth daily for the last 8 years. He is tolerating it fairly well with no significant adverse effects except for very mild skin rash and few episodes of diarrhea every now and then. The patient denied having any significant complaints today. He denied having any significant fever or chills, no nausea or vomiting. The patient denied having any significant chest pain, shortness of breath, cough or hemoptysis. He had chest x-ray performed recently and he is here for evaluation and discussion of his lab and imaging results.  MEDICAL HISTORY: Past Medical History  Diagnosis Date  . Diabetes mellitus   . lung ca dx'd 2004    chemo/xrt comp; tarceva ongoing    ALLERGIES:  is allergic to  iohexol.  MEDICATIONS:  Current Outpatient Prescriptions  Medication Sig Dispense Refill  . aspirin 81 MG tablet Take 81 mg by mouth daily.      Marland Kitchen atorvastatin (LIPITOR) 40 MG tablet Take 40 mg by mouth daily.      Marland Kitchen erlotinib (TARCEVA) 150 MG tablet Take 1 tablet (150 mg total) by mouth daily. Take on an empty stomach 1 hour before meals or 2 hours after.  30 tablet  2  . glimepiride (AMARYL) 4 MG tablet Take 4 mg by mouth 2 (two) times daily.      Marland Kitchen losartan (COZAAR) 50 MG tablet Take 1 tablet (50 mg total) by mouth daily.  90 tablet  3  . metFORMIN (GLUCOPHAGE) 850 MG tablet Take 850 mg by mouth daily.      . metoprolol succinate (TOPROL-XL) 100 MG 24 hr tablet Take 1 tablet (100 mg total) by mouth daily. Take with or immediately following a meal.  1 tablet  0  . omeprazole (PRILOSEC) 20 MG capsule Take 20 mg by mouth daily.       No current facility-administered medications for this visit.    REVIEW OF SYSTEMS:  A comprehensive review of systems was negative except for: Gastrointestinal: positive for diarrhea   PHYSICAL EXAMINATION: General appearance: alert, cooperative and no distress Head: Normocephalic, without obvious abnormality, atraumatic Neck: no adenopathy Lymph nodes: Cervical, supraclavicular, and axillary nodes normal. Resp: clear to auscultation bilaterally Cardio: regular rate and rhythm, S1, S2 normal, no murmur, click, rub or gallop GI: soft, non-tender; bowel sounds normal;  no masses,  no organomegaly Extremities: extremities normal, atraumatic, no cyanosis or edema Neurologic: Alert and oriented X 3, normal strength and tone. Normal symmetric reflexes. Normal coordination and gait  ECOG PERFORMANCE STATUS: 0 - Asymptomatic  Blood pressure 136/73, pulse 77, temperature 98 F (36.7 C), temperature source Oral, resp. rate 18, height 6\' 1"  (1.854 m), weight 225 lb 3.2 oz (102.15 kg).  LABORATORY DATA: Lab Results  Component Value Date   WBC 8.9 07/08/2013    HGB 15.0 07/08/2013   HCT 43.8 07/08/2013   MCV 92.2 07/08/2013   PLT 251 07/08/2013      Chemistry      Component Value Date/Time   NA 139 07/08/2013 1027   NA 138 10/01/2012 1541   NA 135 12/06/2010 0819   K 5.5* 07/08/2013 1027   K 4.6 10/01/2012 1541   K 4.4 12/06/2010 0819   CL 106 10/01/2012 1541   CL 106 06/10/2012 0900   CL 97* 12/06/2010 0819   CO2 25 07/08/2013 1027   CO2 27 10/01/2012 1541   CO2 26 12/06/2010 0819   BUN 14.8 07/08/2013 1027   BUN 10 10/01/2012 1541   BUN 17 12/06/2010 0819   CREATININE 1.4* 07/08/2013 1027   CREATININE 1.3 10/01/2012 1541   CREATININE 1.3* 12/06/2010 0819      Component Value Date/Time   CALCIUM 9.4 07/08/2013 1027   CALCIUM 9.2 10/01/2012 1541   CALCIUM 9.3 12/06/2010 0819   ALKPHOS 64 07/08/2013 1027   ALKPHOS 92* 12/06/2010 0819   ALKPHOS 71 06/07/2010 0814   AST 24 07/08/2013 1027   AST 30 12/06/2010 0819   AST 26 06/07/2010 0814   ALT 32 07/08/2013 1027   ALT 55* 12/06/2010 0819   ALT 39 06/07/2010 0814   BILITOT 0.88 07/08/2013 1027   BILITOT 1.10 12/06/2010 0819   BILITOT 1.7* 06/07/2010 0814       RADIOGRAPHIC STUDIES:  Dg Chest 2 View  07/08/2013   CLINICAL DATA:  History of lung malignancy and tobacco use.  EXAM: CHEST  2 VIEW  COMPARISON:  Comparison made to a chest x-ray of June 15, 2011. And to a CT scan of the thorax dated Jan 09, 2013.  FINDINGS: The lungs are well-expanded. There is no focal infiltrate. Stable subtle density is noted in the left perihilar region. A there are surgical clips in the left pulmonary apex. No discrete mass is demonstrated here. There are chronic changes associated with the posterior lateral aspects of the right 7th and 8th ribs. There is no pleural effusion or pneumothorax. The cardiac silhouette is normal in size. The mediastinum is normal in width.  IMPRESSION: There is no evidence of active cardiopulmonary abnormality. There are chronic stable changes as described.   Electronically Signed    By: David  [place]   On: 07/08/2013 10:17    ASSESSMENT/PLAN: This is a very pleasant 62 years old white male with history of metastatic non-small cell lung cancer, adenocarcinoma. He is currently on treatment with Tarceva and tolerating it fairly well. The patient is doing fine today with no specific complaints. His chest x-ray showed no evidence for active cardiopulmonary abnormality. I recommended for the patient to continue his treatment was Tarceva with the same dose. He would come back for followup visit in 3 months with repeat CT scan of the chest with contrast. He was advised to call immediately if he has any concerning symptoms in the interval.  All questions were answered. The patient knows to call  the clinic with any problems, questions or concerns. We can certainly see the patient much sooner if necessary.

## 2013-07-09 ENCOUNTER — Telehealth: Payer: Self-pay | Admitting: Internal Medicine

## 2013-07-09 NOTE — Telephone Encounter (Signed)
recd call from Sheralyn Boatman in [place] asking labs be moved to the day the CT was scheduled She will notify pt shh

## 2013-07-09 NOTE — Telephone Encounter (Signed)
sw pt appts made for lab and ov per 11/25 pof Adv pt CT will call w appt  shh

## 2013-07-11 ENCOUNTER — Ambulatory Visit: Payer: Medicare Other

## 2013-07-11 ENCOUNTER — Telehealth: Payer: Self-pay | Admitting: *Deleted

## 2013-07-11 DIAGNOSIS — E875 Hyperkalemia: Secondary | ICD-10-CM

## 2013-07-11 LAB — BASIC METABOLIC PANEL
BUN: 14 mg/dL (ref 6–23)
Chloride: 102 mEq/L (ref 96–112)
Potassium: 4.6 mEq/L (ref 3.5–5.3)

## 2013-07-11 NOTE — Telephone Encounter (Addendum)
Called patient and notified to come in  for repeat BMET because of elevated potassium.  Per  Tiana Loft, PA.  Patient stated he could come in at 3:30 today 11/28.  Made appointment and lab aware of add on.

## 2013-07-11 NOTE — Telephone Encounter (Signed)
Message copied by Raphael Gibney on Fri Jul 11, 2013  2:10 PM ------      Message from: Conni Slipper      Created: Fri Jul 11, 2013  1:31 PM       Abnormal results, please call and notify patient to come in for repeat BMET because his potassium is elevated ------

## 2013-07-14 ENCOUNTER — Telehealth: Payer: Self-pay | Admitting: Medical Oncology

## 2013-07-14 ENCOUNTER — Telehealth: Payer: Self-pay | Admitting: *Deleted

## 2013-07-14 NOTE — Telephone Encounter (Signed)
THIS NOTE TO DR.MOHAMED'S NURSE, DIANE BELL,RN. 

## 2013-07-14 NOTE — Telephone Encounter (Addendum)
I left message to call me back to recheck potassium. Repeat k+ was done last Friday and normal and pt notified.

## 2013-07-17 ENCOUNTER — Encounter: Payer: Self-pay | Admitting: *Deleted

## 2013-07-17 NOTE — Progress Notes (Signed)
RECEIVED A FAX FROM [facility] CONCERNING A CONFIRMATION OF PRESCRIPTION SHIPMENT FOR TARCEVA ON 07/14/13.

## 2013-07-31 ENCOUNTER — Other Ambulatory Visit: Payer: Self-pay | Admitting: *Deleted

## 2013-07-31 DIAGNOSIS — Z85118 Personal history of other malignant neoplasm of bronchus and lung: Secondary | ICD-10-CM

## 2013-07-31 MED ORDER — ERLOTINIB HCL 150 MG PO TABS: 150.0000 mg | ORAL_TABLET | Freq: Every day | ORAL | Status: DC

## 2013-08-12 NOTE — Telephone Encounter (Signed)
[facility] sent facsimile confirmation of Tarceva prescription shipment.  Tarceva was shipped on 08-11-2013 with next business day delivery.

## 2013-08-14 HISTORY — PX: ANGIOPLASTY / STENTING FEMORAL: SUR30

## 2013-08-23 ENCOUNTER — Other Ambulatory Visit: Payer: Self-pay | Admitting: Cardiovascular Disease

## 2013-09-03 ENCOUNTER — Ambulatory Visit (INDEPENDENT_AMBULATORY_CARE_PROVIDER_SITE_OTHER): Payer: Medicare Other | Admitting: Physician Assistant

## 2013-09-03 ENCOUNTER — Telehealth: Payer: Self-pay | Admitting: *Deleted

## 2013-09-03 ENCOUNTER — Encounter: Payer: Self-pay | Admitting: Physician Assistant

## 2013-09-03 ENCOUNTER — Encounter: Payer: Self-pay | Admitting: Cardiology

## 2013-09-03 VITALS — BP 157/89 | HR 84 | Ht 73.0 in | Wt 228.0 lb

## 2013-09-03 DIAGNOSIS — Z8679 Personal history of other diseases of the circulatory system: Secondary | ICD-10-CM

## 2013-09-03 DIAGNOSIS — Z01812 Encounter for preprocedural laboratory examination: Secondary | ICD-10-CM

## 2013-09-03 DIAGNOSIS — I10 Essential (primary) hypertension: Secondary | ICD-10-CM

## 2013-09-03 DIAGNOSIS — Z85118 Personal history of other malignant neoplasm of bronchus and lung: Secondary | ICD-10-CM

## 2013-09-03 DIAGNOSIS — R079 Chest pain, unspecified: Secondary | ICD-10-CM

## 2013-09-03 DIAGNOSIS — I2510 Atherosclerotic heart disease of native coronary artery without angina pectoris: Secondary | ICD-10-CM

## 2013-09-03 LAB — CBC WITH DIFFERENTIAL/PLATELET
BASOS PCT: 0.4 % (ref 0.0–3.0)
Basophils Absolute: 0 10*3/uL (ref 0.0–0.1)
EOS ABS: 0.2 10*3/uL (ref 0.0–0.7)
Eosinophils Relative: 2.1 % (ref 0.0–5.0)
HCT: 43.4 % (ref 39.0–52.0)
Hemoglobin: 15 g/dL (ref 13.0–17.0)
LYMPHS PCT: 20.7 % (ref 12.0–46.0)
Lymphs Abs: 2.2 10*3/uL (ref 0.7–4.0)
MCHC: 34.6 g/dL (ref 30.0–36.0)
MCV: 92.3 fl (ref 78.0–100.0)
Monocytes Absolute: 0.8 10*3/uL (ref 0.1–1.0)
Monocytes Relative: 8.1 % (ref 3.0–12.0)
NEUTROS PCT: 68.7 % (ref 43.0–77.0)
Neutro Abs: 7.2 10*3/uL (ref 1.4–7.7)
Platelets: 253 10*3/uL (ref 150.0–400.0)
RBC: 4.7 Mil/uL (ref 4.22–5.81)
RDW: 14.9 % — ABNORMAL HIGH (ref 11.5–14.6)
WBC: 10.5 10*3/uL (ref 4.5–10.5)

## 2013-09-03 LAB — BASIC METABOLIC PANEL
BUN: 17 mg/dL (ref 6–23)
CALCIUM: 9.1 mg/dL (ref 8.4–10.5)
CO2: 26 meq/L (ref 19–32)
CREATININE: 1.4 mg/dL (ref 0.4–1.5)
Chloride: 101 mEq/L (ref 96–112)
GFR: 53.78 mL/min — ABNORMAL LOW (ref >60.00)
Glucose, Bld: 174 mg/dL — ABNORMAL HIGH (ref 70–99)
Potassium: 4.9 mEq/L (ref 3.5–5.1)
Sodium: 135 mEq/L (ref 135–145)

## 2013-09-03 LAB — PROTIME-INR
INR: 1 ratio (ref 0.8–1.0)
PROTHROMBIN TIME: 10.6 s (ref 10.2–12.4)

## 2013-09-03 MED ORDER — ISOSORBIDE MONONITRATE ER 30 MG PO TB24: 30.0000 mg | ORAL_TABLET | Freq: Every day | ORAL | Status: DC

## 2013-09-03 NOTE — Assessment & Plan Note (Signed)
Abdominal ultrasound in 02/2013 showed an infrarenal abdominal aortic aneurysm of 3.4 x 3.5. This has remained stable. Followup in July.

## 2013-09-03 NOTE — Patient Instructions (Signed)
Your physician has recommended you make the following change in your medication:   1. Start Imdur 30 mg once daily.  Your physician recommends that you have labs today: BMET, CBC, PTINR  Your physician has requested that you have a Left cardiac catheterization. Cardiac catheterization is used to diagnose and/or treat various heart conditions. Doctors may recommend this procedure for a number of different reasons. The most common reason is to evaluate chest pain. Chest pain can be a symptom of coronary artery disease (CAD), and cardiac catheterization can show whether plaque is narrowing or blocking your heart's arteries. This procedure is also used to evaluate the valves, as well as measure the blood flow and oxygen levels in different parts of your heart. For further information please visit [email or web address]. Please follow instruction sheet, as given.

## 2013-09-03 NOTE — Progress Notes (Signed)
HPI:  This is a 63 year old male patient Dr. Burt Knack who has history of coronary artery disease status post PCI to the RCA and mid Cfx into the OM2 in 2004. He is also followed for an abdominal aortic aneurysm that was 3.4 x 3.5 on scan 02/28/13. He also has metastatic lung cancer and remains on Tarceva. He has essential hypertension for which losartan was added last office visit a year ago.  The patient comes in today complaining of 6 month history of progressive angina. When he gets up in the morning to walk his dog he develops chest tightness. It sometimes eases if he continues on but if he walks a long distance he gets much worse. He also gets out of breath if he goes up a slight incline. This has progressively worsened and happens on a daily basis. He said the cold weather makes it much worse. He says it's is very similar to when he had his stents placed in 2004. He denies radiation of pain, dizziness, presyncope, palpitations, or edema.  Allergies -- Iohexol    --   Code: HIVES, Desc: pt recieves 50mg  benadryl 1            hour prior to scan  Current Outpatient Prescriptions on File Prior to Visit: aspirin 81 MG tablet, Take 81 mg by mouth daily., Disp: , Rfl:  atorvastatin (LIPITOR) 40 MG tablet, Take 40 mg by mouth daily., Disp: , Rfl:  erlotinib (TARCEVA) 150 MG tablet, Take 1 tablet (150 mg total) by mouth daily. Take on an empty stomach 1 hour before meals or 2 hours after., Disp: 30 tablet, Rfl: 2 glimepiride (AMARYL) 4 MG tablet, Take 4 mg by mouth 2 (two) times daily., Disp: , Rfl:  losartan (COZAAR) 50 MG tablet, TAKE 1 TABLET BY MOUTH EVERY DAY, Disp: 90 tablet, Rfl: 0 metFORMIN (GLUCOPHAGE) 850 MG tablet, Take 850 mg by mouth daily., Disp: , Rfl:  metoprolol succinate (TOPROL-XL) 100 MG 24 hr tablet, Take 1 tablet (100 mg total) by mouth daily. Take with or immediately following a meal., Disp: 1 tablet, Rfl: 0 omeprazole (PRILOSEC) 20 MG capsule, Take 20 mg by mouth daily., Disp: ,  Rfl:   No current facility-administered medications on file prior to visit.   Past Medical History:   Diabetes mellitus                                            lung ca                                         dx'd 2004      Comment:chemo/xrt comp; tarceva ongoing  No past surgical history on file.  No family history on file.   Social History   Marital Status: Married             Spouse Name:                      Years of Education:                 Number of children:             Occupational History   None on file  Social History Main Topics   Smoking Status: Former Smoker  Packs/Day: 0.00  Years:           Quit date: 08/14/2002   Smokeless Status: Not on file                      Alcohol Use: Not on file    Drug Use: Not on file    Sexual Activity: Not on file        Other Topics            Concern   None on file  Social History Narrative   None on file    ROS: Arthritis in his knees. He is also needing carpal tunnel surgery on his left wrist, otherwise see history of present illness   PHYSICAL EXAM: Well-nournished, in no acute distress. Neck: No JVD, HJR, Bruit, or thyroid enlargement  Lungs: No tachypnea, clear without wheezing, rales, or rhonchi  Cardiovascular: RRR, PMI not displaced, heart sounds normal, no murmurs, gallops, bruit, thrill, or heave.  Abdomen: BS normal. Soft without organomegaly, masses, lesions or tenderness.  Extremities: without cyanosis, clubbing or edema. Good distal pulses bilateral  SKin: Warm, no lesions or rashes   Musculoskeletal: No deformities  Neuro: no focal signs  BP 157/89  Pulse 84  Ht 6\' 1"  (1.854 m)  Wt 228 lb (103.42 kg)  BMI 30.09 kg/m2    EKG: Normal sinus rhythm, normal EKG  PCI 2004  DIAGNOSTIC FINDINGS:  1. LV:  EF 60% with moderate inferior hypokinesis.  2. No aortic stenosis or mitral regurgitation.  3. LV:  150/2/9.  4. Left main:  Angiographically normal.  5. LAD:  The LAD  is a large vessel giving rise to three small diagonal     branches.  It is angiographically normal.  6. Circumflex:  The circumflex is a moderate sized vessel giving rise to a     small first and large second obtuse marginal branch.  There is an 80%     stenosis of the mid circumflex extending into the proximal portion of OM-     2.  In addition, there is a 50% stenosis downstream in OM-2.  7. RCA:  There is a 70% stenosis of the proximal vessel and a 40% stenosis     of the mid vessel  RESULTS:  Successful stenting in the proximal right coronary artery and mid  circumflex extending into the second obtuse marginal branch. Both  interventions resulted in no residual stenosis and TIMI-3 flow to the distal  vessels.  The patient also underwent successful intravascular ultrasound of  the left anterior descending as part of the CETP study.    IMPRESSION/PLAN:  Successful stenting of proximal right coronary artery and  circumflex into obtuse marginal #2.  Aspirin will be continued indefinitely.  Plavix will be continued for a minimum of three months given the drug-  eluting stent in the circumflex territory.  Eptifibatide will be continued  for 18 hours.  The sheath will be removed when the ACT is less than 150.  Cholesterol management will be as per the CEPT protocol managed with our  research foundation.     IMPRESSION:  1.  Stable 12 mm pulmonary nodule in the posterior left upper lobe. 2.  No new or progressive disease within the thorax. 3.  Emphysema.   Original Report Authenticated By: Earle Gell, M.D.        Last Resulted: 01/09/13

## 2013-09-03 NOTE — Telephone Encounter (Signed)
Message copied by Earvin Hansen on Wed Sep 03, 2013  3:20 PM ------      Message from: Imogene Burn      Created: Wed Sep 03, 2013  2:51 PM       Labs ok for cath ------

## 2013-09-03 NOTE — Assessment & Plan Note (Signed)
Blood pressure today. Imdur 30 mg added.

## 2013-09-03 NOTE — Assessment & Plan Note (Signed)
status post stenting of the RCA and circumflex into the OM 2 in 2004

## 2013-09-03 NOTE — Telephone Encounter (Signed)
Advised patient of lab results  

## 2013-09-03 NOTE — Assessment & Plan Note (Signed)
Patient has class III angina by CCS guidelines. He has been complaining of chest pain for the past 6 months that is very similar to when he had his stents placed in 2004. I discussed this patient in detail with Dr. Burt Knack who recommends proceeding with cardiac catheterization. Risk and benefits including MI, stroke and death have been discussed with patient who agrees to proceed. We will hold his metformin the day before and his Amaryl the morning of. Isordil 30 mg daily added

## 2013-09-03 NOTE — Assessment & Plan Note (Signed)
Has been stable on Tarceva

## 2013-09-04 ENCOUNTER — Telehealth: Payer: Self-pay | Admitting: Cardiovascular Disease

## 2013-09-04 ENCOUNTER — Encounter (HOSPITAL_COMMUNITY): Payer: Self-pay

## 2013-09-04 NOTE — Telephone Encounter (Signed)
New Prob   Pt has some questions regarding 2 medications he was told to stop prior to a procedure. Please call.

## 2013-09-04 NOTE — Telephone Encounter (Signed)
Spoke with patient who called to receive further clarification of his diabetic medications pre and post cardiac catheterization.  I answered patient's questions to his satisfaction and he verbalized understanding and gratitude.

## 2013-09-05 NOTE — Telephone Encounter (Signed)
New Problem:  Pt is asking if he can take immodium or peptobismal before his cath procedure. Pt would like a call back from the nurse. Pt's procedure is on Monday.

## 2013-09-05 NOTE — Telephone Encounter (Signed)
Spoke with pt. He reports he is on Tarceva and this will cause him to have upset stomach and diarrhea at times when he is stressed. I told him it would be OK to take peptobismal or immodium on Monday prior to cath if needed.

## 2013-09-08 ENCOUNTER — Encounter (HOSPITAL_COMMUNITY)
Admission: RE | Disposition: A | Discharge status: Home / Self Care | Payer: Self-pay | Source: Ambulatory Visit | Attending: Cardiovascular Disease

## 2013-09-08 ENCOUNTER — Ambulatory Visit (HOSPITAL_COMMUNITY)
Admission: RE | Admit: 2013-09-08 | Discharge: 2013-09-09 | Disposition: A | Discharge status: Home / Self Care | Payer: Medicare Other | Source: Ambulatory Visit | Attending: Cardiovascular Disease | Admitting: Cardiovascular Disease

## 2013-09-08 DIAGNOSIS — J438 Other emphysema: Secondary | ICD-10-CM | POA: Insufficient documentation

## 2013-09-08 DIAGNOSIS — D72829 Elevated white blood cell count, unspecified: Secondary | ICD-10-CM | POA: Insufficient documentation

## 2013-09-08 DIAGNOSIS — E119 Type 2 diabetes mellitus without complications: Secondary | ICD-10-CM | POA: Insufficient documentation

## 2013-09-08 DIAGNOSIS — Z8679 Personal history of other diseases of the circulatory system: Secondary | ICD-10-CM

## 2013-09-08 DIAGNOSIS — T82897A Other specified complication of cardiac prosthetic devices, implants and grafts, initial encounter: Secondary | ICD-10-CM | POA: Insufficient documentation

## 2013-09-08 DIAGNOSIS — Z01812 Encounter for preprocedural laboratory examination: Secondary | ICD-10-CM

## 2013-09-08 DIAGNOSIS — I200 Unstable angina: Secondary | ICD-10-CM | POA: Insufficient documentation

## 2013-09-08 DIAGNOSIS — I10 Essential (primary) hypertension: Secondary | ICD-10-CM | POA: Diagnosis present

## 2013-09-08 DIAGNOSIS — C801 Malignant (primary) neoplasm, unspecified: Secondary | ICD-10-CM | POA: Insufficient documentation

## 2013-09-08 DIAGNOSIS — I2510 Atherosclerotic heart disease of native coronary artery without angina pectoris: Secondary | ICD-10-CM | POA: Diagnosis present

## 2013-09-08 DIAGNOSIS — Z9861 Coronary angioplasty status: Secondary | ICD-10-CM | POA: Insufficient documentation

## 2013-09-08 DIAGNOSIS — I714 Abdominal aortic aneurysm, without rupture, unspecified: Secondary | ICD-10-CM | POA: Insufficient documentation

## 2013-09-08 DIAGNOSIS — Y831 Surgical operation with implant of artificial internal device as the cause of abnormal reaction of the patient, or of later complication, without mention of misadventure at the time of the procedure: Secondary | ICD-10-CM | POA: Insufficient documentation

## 2013-09-08 DIAGNOSIS — Z85118 Personal history of other malignant neoplasm of bronchus and lung: Secondary | ICD-10-CM

## 2013-09-08 DIAGNOSIS — R079 Chest pain, unspecified: Secondary | ICD-10-CM

## 2013-09-08 DIAGNOSIS — C3490 Malignant neoplasm of unspecified part of unspecified bronchus or lung: Secondary | ICD-10-CM | POA: Diagnosis present

## 2013-09-08 DIAGNOSIS — I209 Angina pectoris, unspecified: Secondary | ICD-10-CM | POA: Diagnosis present

## 2013-09-08 HISTORY — DX: Abdominal aortic aneurysm, without rupture: I71.4

## 2013-09-08 HISTORY — DX: Abdominal aortic aneurysm, without rupture, unspecified: I71.40

## 2013-09-08 HISTORY — DX: Malignant neoplasm of unspecified part of unspecified bronchus or lung: C34.90

## 2013-09-08 HISTORY — DX: Adverse effect of diagnostic agents, initial encounter: T50.8X5A

## 2013-09-08 HISTORY — PX: LEFT HEART CATHETERIZATION WITH CORONARY ANGIOGRAM: SHX5451

## 2013-09-08 HISTORY — DX: Atherosclerotic heart disease of native coronary artery without angina pectoris: I25.10

## 2013-09-08 HISTORY — DX: Essential (primary) hypertension: I10

## 2013-09-08 LAB — POCT ACTIVATED CLOTTING TIME
ACTIVATED CLOTTING TIME: 216 s
Activated Clotting Time: 265 seconds

## 2013-09-08 LAB — GLUCOSE, CAPILLARY
GLUCOSE-CAPILLARY: 274 mg/dL — AB (ref 70–99)
Glucose-Capillary: 156 mg/dL — ABNORMAL HIGH (ref 70–99)
Glucose-Capillary: 391 mg/dL — ABNORMAL HIGH (ref 70–99)

## 2013-09-08 SURGERY — LEFT HEART CATHETERIZATION WITH CORONARY ANGIOGRAM
Anesthesia: LOCAL

## 2013-09-08 MED ORDER — ASPIRIN 81 MG PO CHEW
81.0000 mg | CHEWABLE_TABLET | Freq: Every day | ORAL | Status: DC
  Administered 2013-09-09: 81 mg via ORAL
  Filled 2013-09-08: qty 1

## 2013-09-08 MED ORDER — FAMOTIDINE IN NACL 20-0.9 MG/50ML-% IV SOLN
20.0000 mg | INTRAVENOUS | Status: AC
  Administered 2013-09-08: 20 mg via INTRAVENOUS
  Filled 2013-09-08: qty 50

## 2013-09-08 MED ORDER — METHYLPREDNISOLONE SODIUM SUCC 125 MG IJ SOLR
INTRAMUSCULAR | Status: AC
  Filled 2013-09-08: qty 2

## 2013-09-08 MED ORDER — SODIUM CHLORIDE 0.9 % IJ SOLN: 3.0000 mL | INTRAMUSCULAR | Status: DC | PRN

## 2013-09-08 MED ORDER — PANTOPRAZOLE SODIUM 40 MG PO TBEC
40.0000 mg | DELAYED_RELEASE_TABLET | Freq: Every day | ORAL | Status: DC
  Administered 2013-09-08 – 2013-09-09 (×2): 40 mg via ORAL
  Filled 2013-09-08 (×2): qty 1

## 2013-09-08 MED ORDER — LIDOCAINE HCL (PF) 1 % IJ SOLN
INTRAMUSCULAR | Status: AC
  Filled 2013-09-08: qty 30

## 2013-09-08 MED ORDER — HEPARIN SODIUM (PORCINE) 1000 UNIT/ML IJ SOLN
INTRAMUSCULAR | Status: AC
  Filled 2013-09-08: qty 1

## 2013-09-08 MED ORDER — SODIUM CHLORIDE 0.9 % IV SOLN
1.0000 mL/kg/h | INTRAVENOUS | Status: AC
  Administered 2013-09-08: 17:00:00 1 mL/kg/h via INTRAVENOUS

## 2013-09-08 MED ORDER — SODIUM CHLORIDE 0.9 % IJ SOLN: 3.0000 mL | Freq: Two times a day (BID) | INTRAMUSCULAR | Status: DC

## 2013-09-08 MED ORDER — CLOPIDOGREL BISULFATE 75 MG PO TABS
75.0000 mg | ORAL_TABLET | Freq: Every day | ORAL | Status: DC
Start: 2013-09-09 — End: 2013-09-09
  Administered 2013-09-09: 75 mg via ORAL
  Filled 2013-09-08: qty 1

## 2013-09-08 MED ORDER — MIDAZOLAM HCL 2 MG/2ML IJ SOLN
INTRAMUSCULAR | Status: AC
  Filled 2013-09-08: qty 2

## 2013-09-08 MED ORDER — CLOPIDOGREL BISULFATE 75 MG PO TABS: 75.0000 mg | ORAL_TABLET | Freq: Every day | ORAL | Status: DC

## 2013-09-08 MED ORDER — SODIUM CHLORIDE 0.9 % IV SOLN
INTRAVENOUS | Status: DC
  Administered 2013-09-08: 10:00:00 via INTRAVENOUS

## 2013-09-08 MED ORDER — ATORVASTATIN CALCIUM 40 MG PO TABS
40.0000 mg | ORAL_TABLET | Freq: Every day | ORAL | Status: DC
  Administered 2013-09-08: 40 mg via ORAL
  Filled 2013-09-08 (×2): qty 1

## 2013-09-08 MED ORDER — OXYCODONE-ACETAMINOPHEN 5-325 MG PO TABS: 1.0000 | ORAL_TABLET | ORAL | Status: DC | PRN

## 2013-09-08 MED ORDER — ONDANSETRON HCL 4 MG/2ML IJ SOLN: 4.0000 mg | Freq: Four times a day (QID) | INTRAMUSCULAR | Status: DC | PRN

## 2013-09-08 MED ORDER — FENTANYL CITRATE 0.05 MG/ML IJ SOLN
INTRAMUSCULAR | Status: AC
  Filled 2013-09-08: qty 2

## 2013-09-08 MED ORDER — LOSARTAN POTASSIUM 50 MG PO TABS
50.0000 mg | ORAL_TABLET | Freq: Every day | ORAL | Status: DC
  Administered 2013-09-08 – 2013-09-09 (×2): 50 mg via ORAL
  Filled 2013-09-08 (×2): qty 1

## 2013-09-08 MED ORDER — METOPROLOL SUCCINATE ER 100 MG PO TB24
100.0000 mg | ORAL_TABLET | Freq: Every day | ORAL | Status: DC
  Administered 2013-09-08 – 2013-09-09 (×2): 100 mg via ORAL
  Filled 2013-09-08 (×2): qty 1

## 2013-09-08 MED ORDER — SODIUM CHLORIDE 0.9 % IV SOLN: 250.0000 mL | INTRAVENOUS | Status: DC | PRN

## 2013-09-08 MED ORDER — ACETAMINOPHEN 325 MG PO TABS: 650.0000 mg | ORAL_TABLET | ORAL | Status: DC | PRN

## 2013-09-08 MED ORDER — ISOSORBIDE MONONITRATE ER 30 MG PO TB24
30.0000 mg | ORAL_TABLET | Freq: Every day | ORAL | Status: DC
  Administered 2013-09-08 – 2013-09-09 (×2): 30 mg via ORAL
  Filled 2013-09-08 (×2): qty 1

## 2013-09-08 MED ORDER — FAMOTIDINE IN NACL 20-0.9 MG/50ML-% IV SOLN
INTRAVENOUS | Status: AC
  Filled 2013-09-08: qty 50

## 2013-09-08 MED ORDER — DIPHENHYDRAMINE HCL 50 MG/ML IJ SOLN
INTRAMUSCULAR | Status: AC
  Filled 2013-09-08: qty 1

## 2013-09-08 MED ORDER — DIPHENHYDRAMINE HCL 50 MG/ML IJ SOLN
25.0000 mg | INTRAMUSCULAR | Status: AC
Start: 2013-09-08 — End: 2013-09-08
  Administered 2013-09-08: 25 mg via INTRAVENOUS

## 2013-09-08 MED ORDER — ZOLPIDEM TARTRATE 5 MG PO TABS
5.0000 mg | ORAL_TABLET | Freq: Once | ORAL | Status: AC
  Administered 2013-09-08: 5 mg via ORAL
  Filled 2013-09-08: qty 1

## 2013-09-08 MED ORDER — INSULIN ASPART 100 UNIT/ML ~~LOC~~ SOLN
0.0000 [IU] | Freq: Every day | SUBCUTANEOUS | Status: DC
  Administered 2013-09-08: 22:00:00 3 [IU] via SUBCUTANEOUS

## 2013-09-08 MED ORDER — METHYLPREDNISOLONE SODIUM SUCC 125 MG IJ SOLR
125.0000 mg | INTRAMUSCULAR | Status: AC
  Administered 2013-09-08: 125 mg via INTRAVENOUS

## 2013-09-08 MED ORDER — CLOPIDOGREL BISULFATE 300 MG PO TABS
ORAL_TABLET | ORAL | Status: AC
  Filled 2013-09-08: qty 2

## 2013-09-08 MED ORDER — GLIMEPIRIDE 4 MG PO TABS
4.0000 mg | ORAL_TABLET | Freq: Two times a day (BID) | ORAL | Status: DC
  Administered 2013-09-09: 4 mg via ORAL
  Filled 2013-09-08 (×3): qty 1

## 2013-09-08 MED ORDER — HEPARIN (PORCINE) IN NACL 2-0.9 UNIT/ML-% IJ SOLN
INTRAMUSCULAR | Status: AC
  Filled 2013-09-08: qty 1000

## 2013-09-08 MED ORDER — VERAPAMIL HCL 2.5 MG/ML IV SOLN
INTRAVENOUS | Status: AC
  Filled 2013-09-08: qty 2

## 2013-09-08 MED ORDER — ASPIRIN 81 MG PO CHEW
81.0000 mg | CHEWABLE_TABLET | ORAL | Status: AC
  Administered 2013-09-08: 81 mg via ORAL
  Filled 2013-09-08: qty 1

## 2013-09-08 MED ORDER — DEXTROSE 50 % IV SOLN
INTRAVENOUS | Status: AC
  Filled 2013-09-08: qty 50

## 2013-09-08 MED ORDER — ERLOTINIB HCL 150 MG PO TABS: 150.0000 mg | ORAL_TABLET | Freq: Every day | ORAL | Status: DC

## 2013-09-08 MED ORDER — DIAZEPAM 5 MG PO TABS
5.0000 mg | ORAL_TABLET | ORAL | Status: AC
  Administered 2013-09-08: 5 mg via ORAL
  Filled 2013-09-08: qty 1

## 2013-09-08 MED ORDER — INSULIN ASPART 100 UNIT/ML ~~LOC~~ SOLN
0.0000 [IU] | Freq: Three times a day (TID) | SUBCUTANEOUS | Status: DC
  Administered 2013-09-08: 19:00:00 15 [IU] via SUBCUTANEOUS
  Administered 2013-09-09: 2 [IU] via SUBCUTANEOUS

## 2013-09-08 NOTE — CV Procedure (Signed)
    Cardiac Catheterization Procedure Note  Name: [name] II MRN: [id number] DOB: 04-15-1951  Procedure: Left Heart Cath, Selective Coronary Angiography, LV angiography, PTCA and stenting of the LCx  Indication: CCS Class 3 angina, hx PCI with stenting of the RCA and LCx in 2004. Exertional angina progressive over several months. Sx's occur with low level activity, especially in cold weather.  Procedural Details:  The right wrist was prepped, draped, and anesthetized with 1% lidocaine. Using the modified Seldinger technique, a 5/6 French sheath was introduced into the right radial artery. 3 mg of verapamil was administered through the sheath, weight-based unfractionated heparin was administered intravenously. Standard Judkins catheters were used for selective coronary angiography and left ventriculography. Catheter exchanges were performed over an exchange length guidewire.  PROCEDURAL FINDINGS Hemodynamics: AO 125/63 LV 127/12   Coronary angiography: Coronary dominance: right  Left mainstem: Arises from the left cusp. Patent without significant disease.  Left anterior descending (LAD): The LAD has mild irregularity. The vessel is essentially patent throughout. I do not appreciate any significant stenoses. The diagonal branches are patent. The vessel reaches the LV apex.  Left circumflex (LCx): The left circumflex has a long stented segment in its midportion. There is diffuse 50% in-stent restenosis leading into a severe 95% stenosis at the distal stent edge. The stent extends into the second OM branch which has no significant disease after the tight 95% stenosis the  Right coronary artery (RCA): The RCA is a large, dominant vessel. The proximal stent is patent without restenosis. The mid vessel has a napkin ring 40-50% stenosis. The distal vessel is patent. The PDA and PLA branches are patent without stenosis..  Left ventriculography: Left ventricular systolic function is normal,  LVEF is estimated at 55-65%, there is no significant mitral regurgitation   PCI Note:  Following the diagnostic procedure, the decision was made to proceed with PCI of the left circumflex. Additional heparin was given for anticoagulation. Once a therapeutic ACT was achieved, a 6 [place] XB guide catheter was inserted.  A cougar coronary guidewire was used to cross the lesion.  The lesion was predilated with a 2.5 x 12 mm balloon.  The lesion was then stented with a 2.5 x 24 mm Promus Premier drug-eluting stent.  The stent was postdilated with a 2.75 mm noncompliant balloon to 18 atm.  Following PCI, there was 0% residual stenosis and TIMI-3 flow. The stent extended from the mid-portion of the previously implanted stent beyond the distal edge of the old stent. Final angiography confirmed an excellent result. The patient tolerated the procedure well. There were no immediate procedural complications. A TR band was used for radial hemostasis. The patient was transferred to the post catheterization recovery area for further monitoring.  PCI Data: Vessel - LCx/Segment - OM2 Percent Stenosis (pre)  95 TIMI-flow 3 Stent 2.5x24 mm Promus DES Percent Stenosis (post) 0 TIMI-flow (post) 3  Final Conclusions:   1. 2 vessel CAD with patency of the previously implanted RCA stent and severe ISR of the LCx stent.  2. Normal LV function 3. Successful PCI of the LCx with a single DES 4. Patency of the LAD without significant stenosis   Recommendations:  DAPT with ASA and plavix for at least 12 months.  Sherren Mocha 09/08/2013, 3:02 PM

## 2013-09-08 NOTE — H&P (View-Only) (Signed)
HPI:  This is a 63 year old male patient Dr. Burt Knack who has history of coronary artery disease status post PCI to the RCA and mid Cfx into the OM2 in 2004. He is also followed for an abdominal aortic aneurysm that was 3.4 x 3.5 on scan 02/28/13. He also has metastatic lung cancer and remains on Tarceva. He has essential hypertension for which losartan was added last office visit a year ago.  The patient comes in today complaining of 6 month history of progressive angina. When he gets up in the morning to walk his dog he develops chest tightness. It sometimes eases if he continues on but if he walks a long distance he gets much worse. He also gets out of breath if he goes up a slight incline. This has progressively worsened and happens on a daily basis. He said the cold weather makes it much worse. He says it's is very similar to when he had his stents placed in 2004. He denies radiation of pain, dizziness, presyncope, palpitations, or edema.  Allergies -- Iohexol    --   Code: HIVES, Desc: pt recieves 50mg  benadryl 1            hour prior to scan  Current Outpatient Prescriptions on File Prior to Visit: aspirin 81 MG tablet, Take 81 mg by mouth daily., Disp: , Rfl:  atorvastatin (LIPITOR) 40 MG tablet, Take 40 mg by mouth daily., Disp: , Rfl:  erlotinib (TARCEVA) 150 MG tablet, Take 1 tablet (150 mg total) by mouth daily. Take on an empty stomach 1 hour before meals or 2 hours after., Disp: 30 tablet, Rfl: 2 glimepiride (AMARYL) 4 MG tablet, Take 4 mg by mouth 2 (two) times daily., Disp: , Rfl:  losartan (COZAAR) 50 MG tablet, TAKE 1 TABLET BY MOUTH EVERY DAY, Disp: 90 tablet, Rfl: 0 metFORMIN (GLUCOPHAGE) 850 MG tablet, Take 850 mg by mouth daily., Disp: , Rfl:  metoprolol succinate (TOPROL-XL) 100 MG 24 hr tablet, Take 1 tablet (100 mg total) by mouth daily. Take with or immediately following a meal., Disp: 1 tablet, Rfl: 0 omeprazole (PRILOSEC) 20 MG capsule, Take 20 mg by mouth daily., Disp: ,  Rfl:   No current facility-administered medications on file prior to visit.   Past Medical History:   Diabetes mellitus                                            lung ca                                         dx'd 2004      Comment:chemo/xrt comp; tarceva ongoing  No past surgical history on file.  No family history on file.   Social History   Marital Status: Married             Spouse Name:                      Years of Education:                 Number of children:             Occupational History   None on file  Social History Main Topics   Smoking Status: Former Smoker  Packs/Day: 0.00  Years:           Quit date: 08/14/2002   Smokeless Status: Not on file                      Alcohol Use: Not on file    Drug Use: Not on file    Sexual Activity: Not on file        Other Topics            Concern   None on file  Social History Narrative   None on file    ROS: Arthritis in his knees. He is also needing carpal tunnel surgery on his left wrist, otherwise see history of present illness   PHYSICAL EXAM: Well-nournished, in no acute distress. Neck: No JVD, HJR, Bruit, or thyroid enlargement  Lungs: No tachypnea, clear without wheezing, rales, or rhonchi  Cardiovascular: RRR, PMI not displaced, heart sounds normal, no murmurs, gallops, bruit, thrill, or heave.  Abdomen: BS normal. Soft without organomegaly, masses, lesions or tenderness.  Extremities: without cyanosis, clubbing or edema. Good distal pulses bilateral  SKin: Warm, no lesions or rashes   Musculoskeletal: No deformities  Neuro: no focal signs  BP 157/89  Pulse 84  Ht 6\' 1"  (1.854 m)  Wt 228 lb (103.42 kg)  BMI 30.09 kg/m2    EKG: Normal sinus rhythm, normal EKG  PCI 2004  DIAGNOSTIC FINDINGS:  1. LV:  EF 60% with moderate inferior hypokinesis.  2. No aortic stenosis or mitral regurgitation.  3. LV:  150/2/9.  4. Left main:  Angiographically normal.  5. LAD:  The LAD  is a large vessel giving rise to three small diagonal     branches.  It is angiographically normal.  6. Circumflex:  The circumflex is a moderate sized vessel giving rise to a     small first and large second obtuse marginal branch.  There is an 80%     stenosis of the mid circumflex extending into the proximal portion of OM-     2.  In addition, there is a 50% stenosis downstream in OM-2.  7. RCA:  There is a 70% stenosis of the proximal vessel and a 40% stenosis     of the mid vessel  RESULTS:  Successful stenting in the proximal right coronary artery and mid  circumflex extending into the second obtuse marginal branch. Both  interventions resulted in no residual stenosis and TIMI-3 flow to the distal  vessels.  The patient also underwent successful intravascular ultrasound of  the left anterior descending as part of the CETP study.    IMPRESSION/PLAN:  Successful stenting of proximal right coronary artery and  circumflex into obtuse marginal #2.  Aspirin will be continued indefinitely.  Plavix will be continued for a minimum of three months given the drug-  eluting stent in the circumflex territory.  Eptifibatide will be continued  for 18 hours.  The sheath will be removed when the ACT is less than 150.  Cholesterol management will be as per the CEPT protocol managed with our  research foundation.     IMPRESSION:  1.  Stable 12 mm pulmonary nodule in the posterior left upper lobe. 2.  No new or progressive disease within the thorax. 3.  Emphysema.   Original Report Authenticated By: Earle Gell, M.D.        Last Resulted: 01/09/13

## 2013-09-08 NOTE — Progress Notes (Signed)
TR BAND REMOVAL  LOCATION:    right radial  DEFLATED PER PROTOCOL:    yes  TIME BAND OFF / DRESSING APPLIED:    1900   SITE UPON ARRIVAL:    Level 0  SITE AFTER BAND REMOVAL:    Level 0  REVERSE ALLEN'S TEST:     positive  CIRCULATION SENSATION AND MOVEMENT:    Within Normal Limits   yes  COMMENTS:   Gauze dressing applied at 1900, rechecked in 30 minutes, gauze dressing dry and intact, CSMs wnls, radial and ulnar pulse present.

## 2013-09-08 NOTE — Interval H&P Note (Signed)
History and Physical Interval Note:  09/08/2013 10:49 AM  [name]  has presented today for surgery, with the diagnosis of cp  The various methods of treatment have been discussed with the patient and family. After consideration of risks, benefits and other options for treatment, the patient has consented to  Procedure(s): LEFT HEART CATHETERIZATION WITH CORONARY ANGIOGRAM (N/A) as a surgical intervention .  The patient's history has been reviewed, patient examined, no change in status, stable for surgery.  I have reviewed the patient's chart and labs.  Questions were answered to the patient's satisfaction.    Pt referred for cath possible PCI by [facility], PA-C. We discussed his case at length when he was seen in the office. He describes CCS Class 3 anginal symptoms similar to past angina prior to stenting in 2004. Agree with plans for cath and possible PCI. Pt well-known to me from long-term follow-up.  Cath Lab Visit (complete for each Cath Lab visit)  Clinical Evaluation Leading to the Procedure:   ACS: no  Non-ACS:    Anginal Classification: CCS III  Anti-ischemic medical therapy: Maximal Therapy (2 or more classes of medications)  Non-Invasive Test Results: No non-invasive testing performed  Prior CABG: No previous CABG         [name]

## 2013-09-08 NOTE — Discharge Instructions (Signed)
PLEASE REMEMBER TO BRING ALL OF YOUR MEDICATIONS TO EACH OF YOUR FOLLOW-UP OFFICE VISITS. ° °PLEASE ATTEND ALL SCHEDULED FOLLOW-UP APPOINTMENTS.  ° °Activity: Increase activity slowly as tolerated. You may shower, but no soaking baths (or swimming) for 1 week. No driving for 2 days. No lifting over 5 lbs for 1 week. No sexual activity for 1 week.  ° °You May Return to Work: in 1 week (if applicable) ° °Wound Care: You may wash cath site gently with soap and water. Keep cath site clean and dry. If you notice pain, swelling, bleeding or pus at your cath site, please call [phone]. ° ° ° °Cardiac Cath Site Care °Refer to this sheet in the next few weeks. These instructions provide you with information on caring for yourself after your procedure. Your caregiver may also give you more specific instructions. Your treatment has been planned according to current medical practices, but problems sometimes occur. Call your caregiver if you have any problems or questions after your procedure. °HOME CARE INSTRUCTIONS °· You may shower 24 hours after the procedure. Remove the bandage (dressing) and gently wash the site with plain soap and water. Gently pat the site dry.  °· Do not apply powder or lotion to the site.  °· Do not sit in a bathtub, swimming pool, or whirlpool for 5 to 7 days.  °· No bending, squatting, or lifting anything over 10 pounds (4.5 kg) as directed by your caregiver.  °· Inspect the site at least twice daily.  °· Do not drive home if you are discharged the same day of the procedure. Have someone else drive you.  °· You may drive 24 hours after the procedure unless otherwise instructed by your caregiver.  °What to expect: °· Any bruising will usually fade within 1 to 2 weeks.  °· Blood that collects in the tissue (hematoma) may be painful to the touch. It should usually decrease in size and tenderness within 1 to 2 weeks.  °SEEK IMMEDIATE MEDICAL CARE IF: °· You have unusual pain at the site or down the  affected limb.  °· You have redness, warmth, swelling, or pain at the site.  °· You have drainage (other than a small amount of blood on the dressing).  °· You have chills.  °· You have a fever or persistent symptoms for more than 72 hours.  °· You have a fever and your symptoms suddenly get worse.  °· Your leg becomes pale, cool, tingly, or numb.  °· You have heavy bleeding from the site. Hold pressure on the site.  °Document Released: 09/02/2010 Document Revised: 07/20/2011 Document Reviewed: 09/02/2010 °ExitCare® Patient Information ©2012 [employer]. ° °

## 2013-09-09 ENCOUNTER — Encounter (HOSPITAL_COMMUNITY): Payer: Self-pay | Admitting: Physician Assistant

## 2013-09-09 DIAGNOSIS — C3490 Malignant neoplasm of unspecified part of unspecified bronchus or lung: Secondary | ICD-10-CM

## 2013-09-09 DIAGNOSIS — I2510 Atherosclerotic heart disease of native coronary artery without angina pectoris: Secondary | ICD-10-CM | POA: Diagnosis present

## 2013-09-09 DIAGNOSIS — I10 Essential (primary) hypertension: Secondary | ICD-10-CM | POA: Diagnosis present

## 2013-09-09 DIAGNOSIS — I209 Angina pectoris, unspecified: Secondary | ICD-10-CM

## 2013-09-09 LAB — BASIC METABOLIC PANEL
BUN: 15 mg/dL (ref 6–23)
CALCIUM: 9.4 mg/dL (ref 8.4–10.5)
CO2: 26 mEq/L (ref 19–32)
Chloride: 101 mEq/L (ref 96–112)
Creatinine, Ser: 1.3 mg/dL (ref 0.50–1.35)
GFR calc Af Amer: 65 mL/min — ABNORMAL LOW (ref >90)
GFR calc non Af Amer: 56 mL/min — ABNORMAL LOW (ref >90)
GLUCOSE: 152 mg/dL — AB (ref 70–99)
POTASSIUM: 5.3 meq/L (ref 3.7–5.3)
SODIUM: 141 meq/L (ref 137–147)

## 2013-09-09 LAB — CBC
HCT: 41.2 % (ref 39.0–52.0)
HEMOGLOBIN: 14.7 g/dL (ref 13.0–17.0)
MCH: 31.8 pg (ref 26.0–34.0)
MCHC: 35.7 g/dL (ref 30.0–36.0)
MCV: 89.2 fL (ref 78.0–100.0)
Platelets: 308 10*3/uL (ref 150–400)
RBC: 4.62 MIL/uL (ref 4.22–5.81)
RDW: 13.8 % (ref 11.5–15.5)
WBC: 17.8 10*3/uL — ABNORMAL HIGH (ref 4.0–10.5)

## 2013-09-09 LAB — GLUCOSE, CAPILLARY: Glucose-Capillary: 137 mg/dL — ABNORMAL HIGH (ref 70–99)

## 2013-09-09 MED ORDER — METFORMIN HCL 850 MG PO TABS: 850.0000 mg | ORAL_TABLET | Freq: Two times a day (BID) | ORAL | Status: DC

## 2013-09-09 MED ORDER — PANTOPRAZOLE SODIUM 40 MG PO TBEC: 40.0000 mg | DELAYED_RELEASE_TABLET | Freq: Every day | ORAL | Status: DC

## 2013-09-09 MED ORDER — NITROGLYCERIN 0.4 MG SL SUBL: 0.4000 mg | SUBLINGUAL_TABLET | SUBLINGUAL | Status: AC | PRN

## 2013-09-09 NOTE — Progress Notes (Signed)
Inpatient Diabetes Program Recommendations  AACE/ADA: New Consensus Statement on Inpatient Glycemic Control (2013)  Target Ranges:  Prepandial:   less than 140 mg/dL      Peak postprandial:   less than 180 mg/dL (1-2 hours)      Critically ill patients:  140 - 180 mg/dL   Please add DM to H&P as well as problem list.  Thank you, Rosita Kea, RN, CNS, Diabetes Coordinator [phone])

## 2013-09-09 NOTE — Progress Notes (Signed)
   TELEMETRY: Reviewed telemetry pt in [facility]R: Filed Vitals:   09/08/13 2038 09/09/13 0035 09/09/13 0533 09/09/13 0743  BP: 168/89 129/58 137/72 151/90  Pulse:  96 85 82  Temp:  97.6 F (36.4 C) 97.4 F (36.3 C) 97.6 F (36.4 C)  TempSrc:  Oral Oral Oral  Resp:  16 18 18   Height:      Weight:  226 lb 6.6 oz (102.7 kg)    SpO2:  94% 95%     Intake/Output Summary (Last 24 hours) at 09/09/13 0945 Last data filed at 09/09/13 0116  Gross per 24 hour  Intake 995.48 ml  Output   2100 ml  Net -1104.52 ml    SUBJECTIVE Feeling well. No chest pain.  LABS: Basic Metabolic Panel:  Recent Labs  09/09/13 0715  NA 141  K 5.3  CL 101  CO2 26  GLUCOSE 152*  BUN 15  CREATININE 1.30  CALCIUM 9.4   Liver Function Tests: No results found for this basename: AST, ALT, ALKPHOS, BILITOT, PROT, ALBUMIN,  in the last 72 hours No results found for this basename: LIPASE, AMYLASE,  in the last 72 hours CBC:  Recent Labs  09/09/13 0715  WBC 17.8*  HGB 14.7  HCT 41.2  MCV 89.2  PLT 308    Radiology/Studies:  No results found.  ECG: Normal.   PHYSICAL EXAM General: Well developed, well nourished, in no acute distress. Head: Normocephalic, atraumatic, sclera non-icteric, no xanthomas, nares are without discharge. Neck: Negative for carotid bruits. JVD not elevated. Lungs: Clear bilaterally to auscultation without wheezes, rales, or rhonchi. Breathing is unlabored. Heart: RRR S1 S2 without murmurs, rubs, or gallops.  Abdomen: Soft, non-tender, non-distended with normoactive bowel sounds. No hepatomegaly. No rebound/guarding. No obvious abdominal masses. Msk:  Strength and tone appears normal for age. Extremities: No clubbing, cyanosis or edema.  Distal pedal pulses are 2+ and equal bilaterally. No radial site hematoma. Neuro: Alert and oriented X 3. Moves all extremities spontaneously. Psych:  Responds to questions appropriately with a normal affect.  ASSESSMENT AND PLAN: 1.  Angina pectoris class 3. S/p stenting of the LCx for instent restenosis with a DES. Continue dual antiplatelet therapy for one year. Stable for DC today with follow up in 2 weeks with Dr. Burt Knack. 2. HTN controlled. 3. DM- elevated BS while metformin on hold. Will resume 48 hours post cath. 4. History of stage 4 lung CA 5. AAA.  Active Problems:   HYPERTENSION, UNSPECIFIED   ABDOMINAL AORTIC ANEURYSM, HX OF   Other and unspecified angina pectoris   CAD (coronary artery disease)   HTN (hypertension)   Lung cancer    Weston Brass Peter Martinique MD,FACC 09/09/2013 9:49 AM

## 2013-09-09 NOTE — Discharge Summary (Signed)
Discharge Summary   Patient ID: [name] MRN: [id number], DOB/AGE: 1951-06-25 63 y.o. Admit date: 09/08/2013 D/C date:     09/09/2013  Primary Care Provider: Criselda Peaches, MD Primary Cardiologist: Burt Knack  Primary Discharge Diagnoses:  1. CAD/unstable angina - prior history: stenting of RCA/LCx 2004 - this admission: s/p DES to LCx for severe ISR 2. Leukocytosis likely due to steroid pre-meds for contrast allergy 3. HTN 4. Diabetes mellitus  Secondary Discharge Diagnoses:  1. AAA - 3.4 x 3.5 on scan 02/28/13 2. Metastatic lung cancer, dx 2004, chemo/xrt comp; tarceva ongoing  Hospital Course: Mr. Knotts is a 63 y/o M with history of CAD s/p PCI to RCA/Cx 2004, HTN, AAA (3.4 x 3.5 on scan 02/28/13), DM, metastatic lung cancer on Tarceva who presented to [facility] 09/08/2013 for planned cath. He was recently seen in the office and was complaining of 6 month history of progressive angina. Symptoms were worse with walking, particularly on an incline, as well as in cold weather. This was similar to prior angina. He denied radiation of pain, dizziness, presyncope, palpitations, or edema. Cardiac cath was recommended. He was brought in for this procedure 09/08/13 which showed: 1. 2 vessel CAD with patency of the previously implanted RCA stent and severe ISR of the LCx stent.  2. Normal LV function  3. Successful PCI of the LCx with a single DES  4. Patency of the LAD without significant stenosis  DAPT with ASA and plavix for at least 12 months was recommended. Prilosec was changed to Protonix. The patient tolerated this procedure well. Post-procedurally he did have a leukocytosis but no evidence of infection - this was felt likely due to the premedication used for his contrast dye allergy. Dr. Martinique has seen and examined the patient today and feels he is stable for discharge. The patient was instructed to resume metformin the evening of 09/10/13. He was also instructed to monitor BP  at home and bring in readings to followup visit (BP variabe 120s-160s at times while in hospital).  Discharge Vitals: Blood pressure 151/90, pulse 82, temperature 97.6 F (36.4 C), temperature source Oral, resp. rate 18, height _0  (1.854 m), weight 226 lb 6.6 oz (102.7 kg), SpO2 95.00%.  Labs: Lab Results  Component Value Date   WBC 17.8* 09/09/2013   HGB 14.7 09/09/2013   HCT 41.2 09/09/2013   MCV 89.2 09/09/2013   PLT 308 09/09/2013     Recent Labs Lab 09/09/13 0715  NA 141  K 5.3  CL 101  CO2 26  BUN 15  CREATININE 1.30  CALCIUM 9.4  GLUCOSE 152*    Diagnostic Studies/Procedures   Cath 09/08/13 Cardiac Catheterization Procedure Note  Name: [name]  MRN: [id number]  DOB: 12/24/1950  Procedure: Left Heart Cath, Selective Coronary Angiography, LV angiography, PTCA and stenting of the LCx  Indication: CCS Class 3 angina, hx PCI with stenting of the RCA and LCx in 2004. Exertional angina progressive over several months. Sx's occur with low level activity, especially in cold weather.  Procedural Details: The right wrist was prepped, draped, and anesthetized with 1% lidocaine. Using the modified Seldinger technique, a 5/6 French sheath was introduced into the right radial artery. 3 mg of verapamil was administered through the sheath, weight-based unfractionated heparin was administered intravenously. Standard Judkins catheters were used for selective coronary angiography and left ventriculography. Catheter exchanges were performed over an exchange length guidewire.  PROCEDURAL FINDINGS  Hemodynamics:  AO 125/63  LV 127/12  Coronary angiography:  Coronary dominance: right  Left mainstem: Arises from the left cusp. Patent without significant disease.  Left anterior descending (LAD): The LAD has mild irregularity. The vessel is essentially patent throughout. I do not appreciate any significant stenoses. The diagonal branches are patent. The vessel reaches the LV apex.  Left  circumflex (LCx): The left circumflex has a long stented segment in its midportion. There is diffuse 50% in-stent restenosis leading into a severe 95% stenosis at the distal stent edge. The stent extends into the second OM branch which has no significant disease after the tight 95% stenosis the  Right coronary artery (RCA): The RCA is a large, dominant vessel. The proximal stent is patent without restenosis. The mid vessel has a napkin ring 40-50% stenosis. The distal vessel is patent. The PDA and PLA branches are patent without stenosis..  Left ventriculography: Left ventricular systolic function is normal, LVEF is estimated at 55-65%, there is no significant mitral regurgitation  PCI Note: Following the diagnostic procedure, the decision was made to proceed with PCI of the left circumflex. Additional heparin was given for anticoagulation. Once a therapeutic ACT was achieved, a 6 [place] XB guide catheter was inserted. A cougar coronary guidewire was used to cross the lesion. The lesion was predilated with a 2.5 x 12 mm balloon. The lesion was then stented with a 2.5 x 24 mm Promus Premier drug-eluting stent. The stent was postdilated with a 2.75 mm noncompliant balloon to 18 atm. Following PCI, there was 0% residual stenosis and TIMI-3 flow. The stent extended from the mid-portion of the previously implanted stent beyond the distal edge of the old stent. Final angiography confirmed an excellent result. The patient tolerated the procedure well. There were no immediate procedural complications. A TR band was used for radial hemostasis. The patient was transferred to the post catheterization recovery area for further monitoring.  PCI Data:  Vessel - LCx/Segment - OM2  Percent Stenosis (pre) 95  TIMI-flow 3  Stent 2.5x24 mm Promus DES  Percent Stenosis (post) 0  TIMI-flow (post) 3  Final Conclusions:  1. 2 vessel CAD with patency of the previously implanted RCA stent and severe ISR of the LCx stent.  2.  Normal LV function  3. Successful PCI of the LCx with a single DES  4. Patency of the LAD without significant stenosis  Recommendations:  DAPT with ASA and plavix for at least 12 months.  Sherren Mocha  09/08/2013, 3:02 PM   Discharge Medications     Medication List    STOP taking these medications       omeprazole 20 MG capsule  Commonly known as:  PRILOSEC      TAKE these medications       aspirin 81 MG chewable tablet  Chew 81 mg by mouth daily.     atorvastatin 40 MG tablet  Commonly known as:  LIPITOR  Take 40 mg by mouth daily.     clopidogrel 75 MG tablet  Commonly known as:  PLAVIX  Take 1 tablet (75 mg total) by mouth daily.     erlotinib 150 MG tablet  Commonly known as:  TARCEVA  Take 1 tablet (150 mg total) by mouth daily. Take on an empty stomach 1 hour before meals or 2 hours after.     glimepiride 4 MG tablet  Commonly known as:  AMARYL  Take 4 mg by mouth 2 (two) times daily.     IMODIUM A-D 1 MG/7.5ML Liqd  Generic drug:  Loperamide HCl  Take by mouth as needed.     isosorbide mononitrate 30 MG 24 hr tablet  Commonly known as:  IMDUR  Take 1 tablet (30 mg total) by mouth daily.     losartan 50 MG tablet  Commonly known as:  COZAAR  Take 50 mg by mouth daily.     metFORMIN 850 MG tablet  Commonly known as:  GLUCOPHAGE  Take 1 tablet (850 mg total) by mouth 2 (two) times daily with a meal.  Start taking on:  09/10/2013     metoprolol succinate 100 MG 24 hr tablet  Commonly known as:  TOPROL-XL  Take 1 tablet (100 mg total) by mouth daily. Take with or immediately following a meal.     nitroGLYCERIN 0.4 MG SL tablet  Commonly known as:  NITROSTAT  Place 1 tablet (0.4 mg total) under the tongue every 5 (five) minutes as needed for chest pain (up to 3 doses).     pantoprazole 40 MG tablet  Commonly known as:  PROTONIX  Take 1 tablet (40 mg total) by mouth daily.        Disposition   The patient will be discharged in stable  condition to home. Discharge Orders   Future Appointments Provider Department Dept Phone   09/17/2013 10:45 AM Jennye Moccasin [facility] [place] [phone]   10/07/2013 10:30 AM [facility] [phone]   10/07/2013 11:30 AM [facility] [phone]   Liquids only 4 hours prior to your exam. Any medications can be taken as usual. Please arrive 15 min prior to your scheduled exam time.   10/08/2013 3:15 PM Curt Bears, MD [facility] [phone]   Future Orders Complete By Expires   Diet - low sodium heart healthy  As directed    Scheduling Instructions:     Diabetic Diet   Increase activity slowly  As directed    Scheduling Instructions:     Restart Metformin on the night of 09/10/13.  Some studies suggest Prilosec/Omeprazole interacts with Plavix. We changed your Prilosec/Omperazole to Protonix for less chance of interaction.   Please monitor your blood pressure occasionally at home. Call your doctor if you tend to get readings of greater than 130 on the top number or 80 on the bottom number. Bring in a log of readings to your next followup appointment.  See last page of AVS for additional discharge instructions.     Follow-up Information   Follow up with Ermalinda Barrios, PA-C On 09/17/2013. (at 10:45 am)    Specialty:  Cardiology   Contact information:   [address], [place] [address] [place]  [address] [phone]         Duration of Discharge Encounter: Greater than 30 minutes including physician and PA time.  Signed, Melina Copa PA-C 09/09/2013, 8:55 AM  Patient seen and examined and history reviewed. Agree with above findings and plan. See my earlier rounding note.  Collier Salina [facility] 09/09/2013 9:50 AM

## 2013-09-09 NOTE — Progress Notes (Signed)
[phone] Pt walked independently so I did not walk with him. Dressed in street clothes. Education completed with pt. Gave heart healthy and diabetic diets. Declined CRP 2 as he wants to exercise on his own. Has treadmill and stationary bike. Pt states he wants to lose about 25 pounds. Exercise ed discussed. Graylon Good RN BSN 09/09/2013 8:37 AM

## 2013-09-11 ENCOUNTER — Encounter: Payer: Self-pay | Admitting: *Deleted

## 2013-09-11 NOTE — Progress Notes (Signed)
RECEIVED A FAX FROM [facility] CONCERNING A CONFIRMATION OF PRESCRIPTION SHIPMENT FOR [place] ON 09/10/13.

## 2013-09-17 ENCOUNTER — Encounter: Payer: Self-pay | Admitting: Physician Assistant

## 2013-09-17 ENCOUNTER — Ambulatory Visit (INDEPENDENT_AMBULATORY_CARE_PROVIDER_SITE_OTHER): Payer: Medicare Other | Admitting: Physician Assistant

## 2013-09-17 VITALS — BP 130/82 | HR 84 | Ht 73.0 in | Wt 223.0 lb

## 2013-09-17 DIAGNOSIS — D72829 Elevated white blood cell count, unspecified: Secondary | ICD-10-CM | POA: Insufficient documentation

## 2013-09-17 DIAGNOSIS — E785 Hyperlipidemia, unspecified: Secondary | ICD-10-CM

## 2013-09-17 NOTE — Assessment & Plan Note (Signed)
White blood cell count went up to 17.8 probably due to steroids for contrast allergy. He is scheduled for blood work February 22 for his cancer workup. We can wait to have it checked until then.

## 2013-09-17 NOTE — Assessment & Plan Note (Signed)
Controlled.  

## 2013-09-17 NOTE — Assessment & Plan Note (Signed)
Patient recently had drug-eluting stent to the circumflex for severe in-stent restenosis. He has done quite well. He denies any chest pain. He can increase his physical activity. Do not lift anything heavy with his right arm until it heals completely. He did have some leukocytosis probably due to steroids for contrast allergy. He is scheduled for blood work February 22 for his cancer workup. We can wait to have it checked until then.

## 2013-09-17 NOTE — Progress Notes (Signed)
HPI:  This is a 63 year old male patient who I saw couple weeks ago with worsening angina. He is a patient of Dr. Burt Knack and was admitted for cardiac catheterization. He was found to have two-vessel coronary disease with patency of the previously implanted RCA stent and severe in-stent restenosis of the left circumflex stent. He had successful drug-eluting stent placed to the left circumflex. He had normal LV function. Had a patent LAD without significant stenosis. Plavix and aspirin were recommended for at least 12 months. Patient had leukocytosis with white count of 17.8 likely due to steroid premeds for contrast allergy.  Patient has history of stenting to the RCA and circumflex in 2004, hypertension, abdominal aortic aneurysm ( 3.4 x 3.5 cm on scan in 02/28/13), diabetes mellitus, metastatic lung cancer on Tarceva.  Patient returns today feeling quite well. He is walking 15 minutes twice a day and walking his dog as well. He denies any chest pain, palpitations, dyspnea, dyspnea on exertion, dizziness, or presyncope.   Allergies -- Iohexol    --   Code: HIVES, Desc: pt recieves 50mg  benadryl 1            hour prior to scan  -- Morphine And Related -- Anxiety   --  hallucinations  Current Outpatient Prescriptions on File Prior to Visit: aspirin 81 MG chewable tablet, Chew 81 mg by mouth daily., Disp: , Rfl:  atorvastatin (LIPITOR) 40 MG tablet, Take 40 mg by mouth daily., Disp: , Rfl:  clopidogrel (PLAVIX) 75 MG tablet, Take 1 tablet (75 mg total) by mouth daily., Disp: 30 tablet, Rfl: 11 erlotinib (TARCEVA) 150 MG tablet, Take 1 tablet (150 mg total) by mouth daily. Take on an empty stomach 1 hour before meals or 2 hours after., Disp: 30 tablet, Rfl: 2 glimepiride (AMARYL) 4 MG tablet, Take 4 mg by mouth 2 (two) times daily., Disp: , Rfl:  isosorbide mononitrate (IMDUR) 30 MG 24 hr tablet, Take 1 tablet (30 mg total) by mouth daily., Disp: 30 tablet, Rfl: 5 Loperamide HCl (IMODIUM A-D) 1  MG/7.5ML LIQD, Take by mouth as needed., Disp: , Rfl:  losartan (COZAAR) 50 MG tablet, Take 50 mg by mouth daily., Disp: , Rfl:  metFORMIN (GLUCOPHAGE) 850 MG tablet, Take 1 tablet (850 mg total) by mouth 2 (two) times daily with a meal., Disp: , Rfl:  metoprolol succinate (TOPROL-XL) 100 MG 24 hr tablet, Take 1 tablet (100 mg total) by mouth daily. Take with or immediately following a meal., Disp: 1 tablet, Rfl: 0 nitroGLYCERIN (NITROSTAT) 0.4 MG SL tablet, Place 1 tablet (0.4 mg total) under the tongue every 5 (five) minutes as needed for chest pain (up to 3 doses)., Disp: 25 tablet, Rfl: 3 pantoprazole (PROTONIX) 40 MG tablet, Take 1 tablet (40 mg total) by mouth daily., Disp: 30 tablet, Rfl: 1  No current facility-administered medications on file prior to visit.   Past Medical History:   Diabetes mellitus                                            Lung cancer                                     dx'd 2004      Comment:chemo/xrt comp; tarceva ongoing   CAD (coronary artery disease)  Comment:a. stenting of RCA/LCx 2004. b. [place] 08/2013:                s/p DES to LCx for severe ISR.   HTN (hypertension)                                           AAA (abdominal aortic aneurysm)                                Comment:a. 3.4 x 3.5 on scan 02/28/13.   Allergic reaction to contrast dye                           No past surgical history on file.  Review of patient's family history indicates:   Heart disease                                           Social History   Marital Status: Married             Spouse Name:                      Years of Education:                 Number of children:             Occupational History   None on file  Social History Main Topics   Smoking Status: Former Smoker                   Packs/Day: 0.00  Years:           Quit date: 08/14/2002   Smokeless Status: Not on file                      Alcohol Use: Not on file    Drug  Use: Not on file    Sexual Activity: Not on file        Other Topics            Concern   None on file  Social History Narrative   None on file    ROS: See history of present illness otherwise negative   PHYSICAL EXAM: Well-nournished, in no acute distress. Neck: No JVD, HJR, Bruit, or thyroid enlargement  Lungs: No tachypnea, clear without wheezing, rales, or rhonchi  Cardiovascular: RRR, PMI not displaced, heart sounds normal, no murmurs, gallops, bruit, thrill, or heave.  Abdomen: BS normal. Soft without organomegaly, masses, lesions or tenderness.  Extremities: Right arm at cath site has some bruising a small knot but good radial and brachial pulses otherwise lower extremities without cyanosis, clubbing or edema. Good distal pulses bilateral  SKin: Warm, no lesions or rashes   Musculoskeletal: No deformities  Neuro: no focal signs  BP 130/82  Pulse 84  Ht 6\' 1"  (1.854 m)  Wt 223 lb (101.152 kg)  BMI 29.43 kg/m2      PCI Data: Vessel - LCx/Segment - OM2 Percent Stenosis (pre)  95 TIMI-flow 3 Stent 2.5x24 mm Promus DES Percent Stenosis (post) 0 TIMI-flow (post) 3  Final Conclusions:   1. 2 vessel CAD with  patency of the previously implanted RCA stent and severe ISR of the LCx stent.   2. Normal LV function 3. Successful PCI of the LCx with a single DES 4. Patency of the LAD without significant stenosis   Recommendations:  DAPT with ASA and plavix for at least 12 months.  Sherren Mocha 09/08/2013, 3:02 PM

## 2013-09-17 NOTE — Patient Instructions (Addendum)
Your physician recommends that you return for lab work on the day of 10/07/13 at the cancer center: [place] recommends that you schedule a follow-up appointment in: 3 Months with Dr. Burt Knack     Your physician recommends that you continue on your current medications as directed. Please refer to the Current Medication list given to you today.

## 2013-09-18 ENCOUNTER — Telehealth: Payer: Self-pay | Admitting: Medical Oncology

## 2013-09-18 NOTE — Telephone Encounter (Signed)
Says he always premedicates for Ct scan with Bendaryl 50 mg 1 hours prior to scan. HE never has any reaction to dye except a little redness where IV needle was. He prefers to do it this way instead of adding anything else in.

## 2013-09-22 ENCOUNTER — Telehealth: Payer: Self-pay | Admitting: Cardiovascular Disease

## 2013-09-22 NOTE — Telephone Encounter (Signed)
New Problem:  Pt states he needs to be seen by Dr. Burt Knack before 2/20. Dr. Burt Knack does not have anything available. Does not want to be seen by PA. Pt would like a call back from the nurse.

## 2013-09-22 NOTE — Telephone Encounter (Signed)
Spoke with patient who c/o confusion about when follow-up is needed.  I advised patient that per Lennox Grumbles notes from 2/4 patient is to f/u with Dr. Burt Knack in 3 months.  Patient denies complaints - states he has no concerns presently.  I  scheduled patient for 5/7 at 0945 and advised him to call the office with questions or concerns prior to that time.  Patient verbalized understanding and agreement and thanked me for the call.

## 2013-09-23 ENCOUNTER — Encounter: Payer: Medicare Other | Admitting: Physician Assistant

## 2013-10-03 ENCOUNTER — Ambulatory Visit: Payer: Medicare Other | Admitting: Cardiovascular Disease

## 2013-10-07 ENCOUNTER — Other Ambulatory Visit: Payer: Medicare Other

## 2013-10-07 ENCOUNTER — Other Ambulatory Visit: Payer: Self-pay | Admitting: Medical Oncology

## 2013-10-07 ENCOUNTER — Observation Stay (HOSPITAL_COMMUNITY)
Admission: RE | Admit: 2013-10-07 | Discharge: 2013-10-07 | Disposition: A | Discharge status: Home / Self Care | Payer: Medicare Other | Source: Ambulatory Visit | Attending: Internal Medicine | Admitting: Internal Medicine

## 2013-10-08 ENCOUNTER — Ambulatory Visit: Payer: Medicare Other | Admitting: Internal Medicine

## 2013-10-08 ENCOUNTER — Telehealth: Payer: Self-pay | Admitting: Internal Medicine

## 2013-10-08 ENCOUNTER — Other Ambulatory Visit: Payer: Medicare Other | Admitting: Lab

## 2013-10-08 NOTE — Telephone Encounter (Signed)
, °

## 2013-10-09 ENCOUNTER — Other Ambulatory Visit: Payer: Medicare Other

## 2013-10-09 ENCOUNTER — Ambulatory Visit: Payer: Medicare Other | Admitting: Internal Medicine

## 2013-10-14 ENCOUNTER — Encounter (HOSPITAL_COMMUNITY): Payer: Self-pay

## 2013-10-14 ENCOUNTER — Ambulatory Visit (HOSPITAL_COMMUNITY)
Admission: RE | Admit: 2013-10-14 | Discharge: 2013-10-14 | Disposition: A | Discharge status: Home / Self Care | Payer: Medicare Other | Source: Ambulatory Visit | Attending: Internal Medicine | Admitting: Internal Medicine

## 2013-10-14 ENCOUNTER — Other Ambulatory Visit (HOSPITAL_BASED_OUTPATIENT_CLINIC_OR_DEPARTMENT_OTHER): Payer: Medicare Other

## 2013-10-14 DIAGNOSIS — J438 Other emphysema: Secondary | ICD-10-CM | POA: Insufficient documentation

## 2013-10-14 DIAGNOSIS — Z85118 Personal history of other malignant neoplasm of bronchus and lung: Secondary | ICD-10-CM

## 2013-10-14 DIAGNOSIS — C3490 Malignant neoplasm of unspecified part of unspecified bronchus or lung: Secondary | ICD-10-CM | POA: Insufficient documentation

## 2013-10-14 DIAGNOSIS — I2584 Coronary atherosclerosis due to calcified coronary lesion: Secondary | ICD-10-CM | POA: Insufficient documentation

## 2013-10-14 DIAGNOSIS — C3410 Malignant neoplasm of upper lobe, unspecified bronchus or lung: Secondary | ICD-10-CM

## 2013-10-14 LAB — CBC WITH DIFFERENTIAL/PLATELET
BASO%: 0.3 % (ref 0.0–2.0)
Basophils Absolute: 0 10*3/uL (ref 0.0–0.1)
EOS ABS: 0.2 10*3/uL (ref 0.0–0.5)
EOS%: 2.2 % (ref 0.0–7.0)
HCT: 42.4 % (ref 38.4–49.9)
HGB: 14.8 g/dL (ref 13.0–17.1)
LYMPH%: 23.3 % (ref 14.0–49.0)
MCH: 31.7 pg (ref 27.2–33.4)
MCHC: 34.9 g/dL (ref 32.0–36.0)
MCV: 90.8 fL (ref 79.3–98.0)
MONO#: 0.8 10*3/uL (ref 0.1–0.9)
MONO%: 8.8 % (ref 0.0–14.0)
NEUT%: 65.4 % (ref 39.0–75.0)
NEUTROS ABS: 5.8 10*3/uL (ref 1.5–6.5)
PLATELETS: 246 10*3/uL (ref 140–400)
RBC: 4.67 10*6/uL (ref 4.20–5.82)
RDW: 15 % — AB (ref 11.0–14.6)
WBC: 8.8 10*3/uL (ref 4.0–10.3)
lymph#: 2.1 10*3/uL (ref 0.9–3.3)

## 2013-10-14 LAB — COMPREHENSIVE METABOLIC PANEL (CC13)
ALBUMIN: 4 g/dL (ref 3.5–5.0)
ALT: 28 U/L (ref 0–55)
AST: 20 U/L (ref 5–34)
Alkaline Phosphatase: 78 U/L (ref 40–150)
Anion Gap: 10 mEq/L (ref 3–11)
BUN: 16.6 mg/dL (ref 7.0–26.0)
CO2: 25 mEq/L (ref 22–29)
Calcium: 9.7 mg/dL (ref 8.4–10.4)
Chloride: 105 mEq/L (ref 98–109)
Creatinine: 1.5 mg/dL — ABNORMAL HIGH (ref 0.7–1.3)
GLUCOSE: 172 mg/dL — AB (ref 70–140)
POTASSIUM: 4.8 meq/L (ref 3.5–5.1)
Sodium: 140 mEq/L (ref 136–145)
TOTAL PROTEIN: 7.6 g/dL (ref 6.4–8.3)
Total Bilirubin: 0.98 mg/dL (ref 0.20–1.20)

## 2013-10-14 MED ORDER — IOHEXOL 300 MG/ML  SOLN
80.0000 mL | Freq: Once | INTRAMUSCULAR | Status: AC | PRN
  Administered 2013-10-14: 80 mL via INTRAVENOUS

## 2013-10-22 ENCOUNTER — Encounter: Payer: Self-pay | Admitting: Internal Medicine

## 2013-10-22 ENCOUNTER — Ambulatory Visit (HOSPITAL_BASED_OUTPATIENT_CLINIC_OR_DEPARTMENT_OTHER): Payer: Medicare Other | Admitting: Internal Medicine

## 2013-10-22 VITALS — BP 133/74 | HR 97 | Temp 98.0°F | Resp 18 | Ht 73.0 in | Wt 224.1 lb

## 2013-10-22 DIAGNOSIS — C3490 Malignant neoplasm of unspecified part of unspecified bronchus or lung: Secondary | ICD-10-CM

## 2013-10-22 DIAGNOSIS — R21 Rash and other nonspecific skin eruption: Secondary | ICD-10-CM

## 2013-10-22 DIAGNOSIS — C3410 Malignant neoplasm of upper lobe, unspecified bronchus or lung: Secondary | ICD-10-CM

## 2013-10-22 NOTE — Progress Notes (Signed)
[place] Telephone:([phone]) [phone]   [phone]) [phone]  OFFICE PROGRESS NOTE  [facility], [name], [address], [address] [place] [address]  DIAGNOSIS: Metastatic non-small cell lung cancer, adenocarcinoma initially diagnosed in December of 2004  PRIOR THERAPY: 1) status post course of concurrent chemoradiation under the care of Dr. Truddie Coco. 2) status post left upper lobe wedge resection on 01/10/2005 for recurrent disease in the left lung.  CURRENT THERAPY: Tarceva 150 mg by mouth daily started in 2006.  DISEASE STAGE: Stage IV  CHEMOTHERAPY INTENT: Palliative  CURRENT # OF CHEMOTHERAPY CYCLES: 8 years  CURRENT ANTIEMETICS: none  CURRENT SMOKING STATUS: Former smoker, quit January 2004  ORAL CHEMOTHERAPY AND CONSENT: Yes  CURRENT BISPHOSPHONATES USE: None  PAIN MANAGEMENT: None  NARCOTICS INDUCED CONSTIPATION: None  LIVING WILL AND CODE STATUS: Patient states that he requested a DO NOT RESUSCITATE when he was diagnosed in 2004 however now that he is 10 years out from diagnosis he would like to change this to limited code.   INTERVAL HISTORY: [name] 63 y.o. male returns to the clinic today for followup visit. The patient has been on Tarceva 150 mg by mouth daily since 2006. He is tolerating it fairly well with no significant adverse effects except for very mild skin rash and few episodes of diarrhea every now and then. The patient denied having any significant complaints today. He denied having any significant fever or chills, no nausea or vomiting. The patient denied having any significant chest pain, shortness of breath, cough or hemoptysis. He had repeat CT scan of the chest performed recently and he is here for evaluation and discussion of his scan results. He had a cardiac stent placed recently for recurrent chest pain.  MEDICAL HISTORY: Past Medical History  Diagnosis Date  . Diabetes mellitus   . Lung cancer dx'd 2004   chemo/xrt comp; tarceva ongoing  . CAD (coronary artery disease)     a. stenting of RCA/LCx 2004. b. [place] 08/2013:  s/p DES to LCx for severe ISR.  Marland Kitchen HTN (hypertension)   . AAA (abdominal aortic aneurysm)     a. 3.4 x 3.5 on scan 02/28/13.  . Allergic reaction to contrast dye     ALLERGIES:  is allergic to iohexol; morphine and related; and other.  MEDICATIONS:  Current Outpatient Prescriptions  Medication Sig Dispense Refill  . aspirin 81 MG chewable tablet Chew 81 mg by mouth daily.      Marland Kitchen atorvastatin (LIPITOR) 40 MG tablet Take 40 mg by mouth daily.      . clopidogrel (PLAVIX) 75 MG tablet Take 1 tablet (75 mg total) by mouth daily.  30 tablet  11  . erlotinib (TARCEVA) 150 MG tablet Take 1 tablet (150 mg total) by mouth daily. Take on an empty stomach 1 hour before meals or 2 hours after.  30 tablet  2  . glimepiride (AMARYL) 4 MG tablet Take 4 mg by mouth 2 (two) times daily.      . isosorbide mononitrate (IMDUR) 30 MG 24 hr tablet Take 1 tablet (30 mg total) by mouth daily.  30 tablet  5  . Loperamide HCl (IMODIUM A-D) 1 MG/7.5ML LIQD Take by mouth as needed.      Marland Kitchen losartan (COZAAR) 50 MG tablet Take 50 mg by mouth daily.      . metFORMIN (GLUCOPHAGE) 850 MG tablet Take 1 tablet (850 mg total) by mouth 2 (two) times daily  with a meal.      . metoprolol succinate (TOPROL-XL) 100 MG 24 hr tablet Take 1 tablet (100 mg total) by mouth daily. Take with or immediately following a meal.  1 tablet  0  . nitroGLYCERIN (NITROSTAT) 0.4 MG SL tablet Place 1 tablet (0.4 mg total) under the tongue every 5 (five) minutes as needed for chest pain (up to 3 doses).  25 tablet  3  . pantoprazole (PROTONIX) 40 MG tablet Take 1 tablet (40 mg total) by mouth daily.  30 tablet  1   No current facility-administered medications for this visit.    REVIEW OF SYSTEMS:  Constitutional: negative Eyes: negative Ears, nose, mouth, throat, and face: negative Respiratory: negative Cardiovascular:  negative Gastrointestinal: negative Genitourinary:negative Integument/breast: negative Hematologic/lymphatic: negative Musculoskeletal:negative Neurological: negative Behavioral/Psych: negative Endocrine: negative Allergic/Immunologic: negative   PHYSICAL EXAMINATION: General appearance: alert, cooperative and no distress Head: Normocephalic, without obvious abnormality, atraumatic Neck: no adenopathy Lymph nodes: Cervical, supraclavicular, and axillary nodes normal. Resp: clear to auscultation bilaterally Cardio: regular rate and rhythm, S1, S2 normal, no murmur, click, rub or gallop GI: soft, non-tender; bowel sounds normal; no masses,  no organomegaly Extremities: extremities normal, atraumatic, no cyanosis or edema Neurologic: Alert and oriented X 3, normal strength and tone. Normal symmetric reflexes. Normal coordination and gait  ECOG PERFORMANCE STATUS: 0 - Asymptomatic  Blood pressure 133/74, pulse 97, temperature 98 F (36.7 C), temperature source Oral, resp. rate 18, height 6\' 1"  (1.854 m), weight 224 lb 1.6 oz (101.651 kg), SpO2 98.00%.  LABORATORY DATA: Lab Results  Component Value Date   WBC 8.8 10/14/2013   HGB 14.8 10/14/2013   HCT 42.4 10/14/2013   MCV 90.8 10/14/2013   PLT 246 10/14/2013      Chemistry      Component Value Date/Time   NA 140 10/14/2013 1032   NA 141 09/09/2013 0715   NA 135 12/06/2010 0819   K 4.8 10/14/2013 1032   K 5.3 09/09/2013 0715   K 4.4 12/06/2010 0819   CL 101 09/09/2013 0715   CL 106 06/10/2012 0900   CL 97* 12/06/2010 0819   CO2 25 10/14/2013 1032   CO2 26 09/09/2013 0715   CO2 26 12/06/2010 0819   BUN 16.6 10/14/2013 1032   BUN 15 09/09/2013 0715   BUN 17 12/06/2010 0819   CREATININE 1.5* 10/14/2013 1032   CREATININE 1.30 09/09/2013 0715   CREATININE 1.3* 12/06/2010 0819      Component Value Date/Time   CALCIUM 9.7 10/14/2013 1032   CALCIUM 9.4 09/09/2013 0715   CALCIUM 9.3 12/06/2010 0819   ALKPHOS 78 10/14/2013 1032   ALKPHOS 92* 12/06/2010 0819    ALKPHOS 71 06/07/2010 0814   AST 20 10/14/2013 1032   AST 30 12/06/2010 0819   AST 26 06/07/2010 0814   ALT 28 10/14/2013 1032   ALT 55* 12/06/2010 0819   ALT 39 06/07/2010 0814   BILITOT 0.98 10/14/2013 1032   BILITOT 1.10 12/06/2010 0819   BILITOT 1.7* 06/07/2010 0814       RADIOGRAPHIC STUDIES:  Ct Chest W Contrast  10/14/2013   CLINICAL DATA:  Followup lung cancer  EXAM: CT CHEST WITH CONTRAST  TECHNIQUE: Multidetector CT imaging of the chest was performed during intravenous contrast administration.  CONTRAST:  1mL OMNIPAQUE IOHEXOL 300 MG/ML  SOLN  COMPARISON:  01/09/2013  FINDINGS: The heart size is normal. There is no pericardial effusion. Calcified atherosclerotic disease involves the thoracic aorta as well as the LAD and  RCA coronary arteries. There is no enlarged mediastinal or hilar lymph nodes identified.  Moderate changes of centrilobular and paraseptal emphysema noted. Postoperative change within the left upper lobe status post resection of left lung cancer noted. Nodular density along the suture line is again identified measuring 1.1 cm, image 24/series 5. This is unchanged from previous exam. No new pulmonary nodules or mass is identified.  No axillary or supraclavicular adenopathy. Incidental imaging through the upper abdomen is on unremarkable. No aggressive lytic or sclerotic bone lesions.  IMPRESSION: 1. Stable CT of the chest. 2. Nodular density in the area of postop change within the left lung is unchanged from the previous exam. No new or progressive disease identified within the chest. 3. Emphysema 4. Calcified atherosclerotic disease including coronary artery calcifications.   Electronically Signed   By: Kerby Moors M.D.   On: 10/14/2013 13:20   ASSESSMENT/PLAN: This is a very pleasant 63 years old white male with history of metastatic non-small cell lung cancer, adenocarcinoma. He is currently on treatment with Tarceva and tolerating it fairly well. The patient is doing  fine today with no specific complaints.  Repeat CT scan of the chest performed recently showed no evidence for disease recurrence. I discussed the scan results with the patient today. I recommended for him to continue on Tarceva with the current dose with a followup visit and repeat CT scan of the chest in 6 month He was advised to call immediately if he has any concerning symptoms in the interval.  All questions were answered. The patient knows to call the clinic with any problems, questions or concerns. We can certainly see the patient much sooner if necessary. I spent 15 minutes of face-to-face counseling with the patient today after the total visit time 25 minutes.  Disclaimer: This note was dictated with voice recognition software. Similar sounding words can inadvertently be transcribed and may not be corrected upon review.

## 2013-10-23 ENCOUNTER — Telehealth: Payer: Self-pay | Admitting: Internal Medicine

## 2013-10-23 NOTE — Telephone Encounter (Signed)
s.w. pt wife and advised on Sept appts...Marland Kitchenok and aware of d.t

## 2013-10-24 ENCOUNTER — Other Ambulatory Visit: Payer: Self-pay

## 2013-10-24 MED ORDER — ISOSORBIDE MONONITRATE ER 30 MG PO TB24: 30.0000 mg | ORAL_TABLET | Freq: Every day | ORAL | Status: DC

## 2013-10-24 MED ORDER — PANTOPRAZOLE SODIUM 40 MG PO TBEC: 40.0000 mg | DELAYED_RELEASE_TABLET | Freq: Every day | ORAL | Status: DC

## 2013-10-27 ENCOUNTER — Other Ambulatory Visit: Payer: Self-pay

## 2013-10-27 MED ORDER — CLOPIDOGREL BISULFATE 75 MG PO TABS: 75.0000 mg | ORAL_TABLET | Freq: Every day | ORAL | Status: DC

## 2013-11-10 ENCOUNTER — Other Ambulatory Visit: Payer: Self-pay | Admitting: *Deleted

## 2013-11-10 DIAGNOSIS — Z85118 Personal history of other malignant neoplasm of bronchus and lung: Secondary | ICD-10-CM

## 2013-11-10 NOTE — Telephone Encounter (Signed)
THIS REFILL REQUEST FOR TARCEVA WAS GIVEN TO DR.MOHAMED'S NURSE, STEPHANIE JOHNSON,RN.

## 2013-11-11 MED ORDER — ERLOTINIB HCL 150 MG PO TABS: 150.0000 mg | ORAL_TABLET | Freq: Every day | ORAL | Status: DC

## 2013-11-11 NOTE — Addendum Note (Signed)
Addended by: Wyonia Hough on: 11/11/2013 11:34 AM   Modules accepted: Orders

## 2013-11-11 NOTE — Telephone Encounter (Signed)
RECEIVED A FAX FROM [facility] CONCERNING A CONFIRMATION OF FACSIMILE RECEIPT FOR PT. REFERRAL. 

## 2013-11-24 ENCOUNTER — Other Ambulatory Visit: Payer: Self-pay | Admitting: Cardiovascular Disease

## 2013-12-10 ENCOUNTER — Other Ambulatory Visit: Payer: Self-pay | Admitting: *Deleted

## 2013-12-10 DIAGNOSIS — Z85118 Personal history of other malignant neoplasm of bronchus and lung: Secondary | ICD-10-CM

## 2013-12-10 MED ORDER — ERLOTINIB HCL 150 MG PO TABS: 150.0000 mg | ORAL_TABLET | Freq: Every day | ORAL | Status: DC

## 2013-12-11 NOTE — Telephone Encounter (Signed)
RECEIVED A FAX FROM [facility] CONCERNING A CONFIRMATION OF FACSIMILE RECEIPT FOR PT. REFERRAL. 

## 2013-12-12 NOTE — Telephone Encounter (Signed)
RECEIVED A FAX FROM [facility] CONCERNING A CONFIRMATION OF PRESCRIPTION SHIPMENT FOR [facility] ON 12/11/13.

## 2013-12-18 ENCOUNTER — Ambulatory Visit (INDEPENDENT_AMBULATORY_CARE_PROVIDER_SITE_OTHER): Payer: Medicare Other | Admitting: Cardiovascular Disease

## 2013-12-18 ENCOUNTER — Encounter: Payer: Self-pay | Admitting: Cardiovascular Disease

## 2013-12-18 VITALS — BP 130/68 | HR 82 | Ht 73.0 in | Wt 221.4 lb

## 2013-12-18 DIAGNOSIS — E785 Hyperlipidemia, unspecified: Secondary | ICD-10-CM

## 2013-12-18 DIAGNOSIS — I10 Essential (primary) hypertension: Secondary | ICD-10-CM

## 2013-12-18 DIAGNOSIS — I2510 Atherosclerotic heart disease of native coronary artery without angina pectoris: Secondary | ICD-10-CM

## 2013-12-18 NOTE — Progress Notes (Signed)
HPI:  63 year-old male with CAD presenting for follow-up evaluation. He has had PCI of the RCA and LCx in 2004 and did well until January 2015 when he presented with progressive angina. He was found to have severe ISR in the circumflex and underwent PCI with a drug-eluting stent. His anginal sx's have resolved and he has done well in the interim. He is followed for lung cancer and has been remarkably stable for many years. He is tolerating DAPT with ASA and plavix and has had no bleeding or bruising problems. He denies CP, dyspnea, edema, or palpitations. He has been eating healthy. He's not engaged in routine exercise.   Outpatient Encounter Prescriptions as of 12/18/2013  Medication Sig  . aspirin 81 MG chewable tablet Chew 81 mg by mouth daily.  Marland Kitchen atorvastatin (LIPITOR) 40 MG tablet Take 40 mg by mouth daily.  . clopidogrel (PLAVIX) 75 MG tablet Take 1 tablet (75 mg total) by mouth daily.  Marland Kitchen erlotinib (TARCEVA) 150 MG tablet Take 1 tablet (150 mg total) by mouth daily. Take on an empty stomach 1 hour before meals or 2 hours after.  Marland Kitchen glimepiride (AMARYL) 4 MG tablet Take 4 mg by mouth 2 (two) times daily.  . isosorbide mononitrate (IMDUR) 30 MG 24 hr tablet Take 1 tablet (30 mg total) by mouth daily.  . Loperamide HCl (IMODIUM A-D) 1 MG/7.5ML LIQD Take by mouth as needed.  Marland Kitchen losartan (COZAAR) 50 MG tablet TAKE 1 TABLET BY MOUTH EVERY DAY  . metFORMIN (GLUCOPHAGE) 850 MG tablet Take 1 tablet (850 mg total) by mouth 2 (two) times daily with a meal.  . metoprolol succinate (TOPROL-XL) 100 MG 24 hr tablet Take 1 tablet (100 mg total) by mouth daily. Take with or immediately following a meal.  . nitroGLYCERIN (NITROSTAT) 0.4 MG SL tablet Place 1 tablet (0.4 mg total) under the tongue every 5 (five) minutes as needed for chest pain (up to 3 doses).  . pantoprazole (PROTONIX) 40 MG tablet Take 1 tablet (40 mg total) by mouth daily.    Allergies  Allergen Reactions  . Iohexol      Code: HIVES,  Desc: pt recieves 77m benadryl 1 hour prior to scan   . Morphine And Related Anxiety    hallucinations  . Other Rash    chlorascrub when starting IV    Past Medical History  Diagnosis Date  . Diabetes mellitus   . Lung cancer dx'd 2004    chemo/xrt comp; tarceva ongoing  . CAD (coronary artery disease)     a. stenting of RCA/LCx 2004. b. [place]:  s/p DES to LCx for severe ISR.  .Marland KitchenHTN (hypertension)   . AAA (abdominal aortic aneurysm)     a. 3.4 x 3.5 on scan 02/28/13.  . Allergic reaction to contrast dye     ROS: Negative except as per HPI  BP 130/68  Pulse 82  Ht 6' 1"  (1.854 m)  Wt 100.426 kg (221 lb 6.4 oz)  BMI 29.22 kg/m2  PHYSICAL EXAM: Pt is alert and oriented, NAD HEENT: normal Neck: JVP - normal, carotids 2+= without bruits Lungs: CTA bilaterally CV: RRR without murmur or gallop Abd: soft, NT, Positive BS, no hepatomegaly Ext: no C/C/E, distal pulses intact and equal Skin: warm/dry no rash  EKG:  NSR 85 bpm, within normal limits.  Cath Note 09/08/2013: PROCEDURAL FINDINGS  Hemodynamics:  AO 125/63  LV 127/12  Coronary angiography:  Coronary dominance: right  Left mainstem: Arises  from the left cusp. Patent without significant disease.  Left anterior descending (LAD): The LAD has mild irregularity. The vessel is essentially patent throughout. I do not appreciate any significant stenoses. The diagonal branches are patent. The vessel reaches the LV apex.  Left circumflex (LCx): The left circumflex has a long stented segment in its midportion. There is diffuse 50% in-stent restenosis leading into a severe 95% stenosis at the distal stent edge. The stent extends into the second OM branch which has no significant disease after the tight 95% stenosis the  Right coronary artery (RCA): The RCA is a large, dominant vessel. The proximal stent is patent without restenosis. The mid vessel has a napkin ring 40-50% stenosis. The distal vessel is patent. The PDA and PLA  branches are patent without stenosis..  Left ventriculography: Left ventricular systolic function is normal, LVEF is estimated at 55-65%, there is no significant mitral regurgitation  PCI Note: Following the diagnostic procedure, the decision was made to proceed with PCI of the left circumflex. Additional heparin was given for anticoagulation. Once a therapeutic ACT was achieved, a 6 [place] XB guide catheter was inserted. A cougar coronary guidewire was used to cross the lesion. The lesion was predilated with a 2.5 x 12 mm balloon. The lesion was then stented with a 2.5 x 24 mm Promus Premier drug-eluting stent. The stent was postdilated with a 2.75 mm noncompliant balloon to 18 atm. Following PCI, there was 0% residual stenosis and TIMI-3 flow. The stent extended from the mid-portion of the previously implanted stent beyond the distal edge of the old stent. Final angiography confirmed an excellent result. The patient tolerated the procedure well. There were no immediate procedural complications. A TR band was used for radial hemostasis. The patient was transferred to the post catheterization recovery area for further monitoring.  PCI Data:  Vessel - LCx/Segment - OM2  Percent Stenosis (pre) 95  TIMI-flow 3  Stent 2.5x24 mm Promus DES  Percent Stenosis (post) 0  TIMI-flow (post) 3  Final Conclusions:  1. 2 vessel CAD with patency of the previously implanted RCA stent and severe ISR of the LCx stent.  2. Normal LV function  3. Successful PCI of the LCx with a single DES  4. Patency of the LAD without significant stenosis  Recommendations:  DAPT with ASA and plavix for at least 12 months.  CT Chest: IMPRESSION:  1. Stable CT of the chest.  2. Nodular density in the area of postop change within the left lung  is unchanged from the previous exam. No new or progressive disease  identified within the chest.  3. Emphysema  4. Calcified atherosclerotic disease including coronary artery    calcifications.  ASSESSMENT AND PLAN: 1. CAD, native vessel. No symptoms of angina. Continue DAPT with ASA and plavix. Favor tx for another year considering he has 2 layers of stent in the LCx. He understands to call if anginal sx's occur. Meds reviewed and are appropriate.   2. HTN - BP controlled on current Rx.  3. Hyperlipidemia - stable on atorvastatin. Followed by Dr Nyoka Cowden.  I will see him back in one year for follow-up evaluation.  Sherren Mocha 12/18/2013 9:52 AM

## 2013-12-18 NOTE — Patient Instructions (Signed)
Your physician wants you to follow-up in: 1 YEAR with Dr Cooper.  You will receive a reminder letter in the mail two months in advance. If you don't receive a letter, please call our office to schedule the follow-up appointment.  Your physician recommends that you continue on your current medications as directed. Please refer to the Current Medication list given to you today.  

## 2014-01-09 ENCOUNTER — Telehealth: Payer: Self-pay | Admitting: *Deleted

## 2014-01-09 NOTE — Telephone Encounter (Signed)
[facility] sent facsimile confirmation of Tarceva prescription shipment.  Tarceva was shipped on 01-08-2014 with next business day delivery.

## 2014-02-10 ENCOUNTER — Encounter: Payer: Self-pay | Admitting: *Deleted

## 2014-02-10 NOTE — Progress Notes (Signed)
RECEIVED A FAX FROM [facility] CONCERNING A CONFIRMATION OF PRESCRIPTION SHIPMENT FOR [facility] ON 02/09/14.

## 2014-02-19 ENCOUNTER — Encounter: Payer: Self-pay | Admitting: Internal Medicine

## 2014-02-21 ENCOUNTER — Other Ambulatory Visit: Payer: Self-pay | Admitting: Cardiovascular Disease

## 2014-02-23 ENCOUNTER — Other Ambulatory Visit (HOSPITAL_COMMUNITY): Payer: Self-pay | Admitting: Cardiology

## 2014-02-23 DIAGNOSIS — I714 Abdominal aortic aneurysm, without rupture, unspecified: Secondary | ICD-10-CM

## 2014-03-02 ENCOUNTER — Ambulatory Visit (HOSPITAL_COMMUNITY): Payer: Medicare Other | Attending: Cardiovascular Disease | Admitting: Radiology

## 2014-03-02 DIAGNOSIS — E119 Type 2 diabetes mellitus without complications: Secondary | ICD-10-CM | POA: Insufficient documentation

## 2014-03-02 DIAGNOSIS — I2510 Atherosclerotic heart disease of native coronary artery without angina pectoris: Secondary | ICD-10-CM | POA: Insufficient documentation

## 2014-03-02 DIAGNOSIS — J4489 Other specified chronic obstructive pulmonary disease: Secondary | ICD-10-CM | POA: Insufficient documentation

## 2014-03-02 DIAGNOSIS — I10 Essential (primary) hypertension: Secondary | ICD-10-CM | POA: Insufficient documentation

## 2014-03-02 DIAGNOSIS — J449 Chronic obstructive pulmonary disease, unspecified: Secondary | ICD-10-CM | POA: Insufficient documentation

## 2014-03-02 DIAGNOSIS — I714 Abdominal aortic aneurysm, without rupture, unspecified: Secondary | ICD-10-CM | POA: Insufficient documentation

## 2014-03-02 DIAGNOSIS — Z87891 Personal history of nicotine dependence: Secondary | ICD-10-CM | POA: Insufficient documentation

## 2014-03-02 DIAGNOSIS — E785 Hyperlipidemia, unspecified: Secondary | ICD-10-CM | POA: Insufficient documentation

## 2014-03-02 NOTE — Progress Notes (Signed)
Abdominal aorta Duplex performed.

## 2014-03-06 ENCOUNTER — Other Ambulatory Visit: Payer: Self-pay | Admitting: *Deleted

## 2014-03-06 NOTE — Telephone Encounter (Signed)
THIS REFILL REQUEST FOR TARCEVA WAS GIVEN TO DR.MOHAMED'S NURSE, DIANE BELL,RN.

## 2014-03-11 ENCOUNTER — Telehealth: Payer: Self-pay | Admitting: Medical Oncology

## 2014-03-11 DIAGNOSIS — Z85118 Personal history of other malignant neoplasm of bronchus and lung: Secondary | ICD-10-CM

## 2014-03-11 MED ORDER — ERLOTINIB HCL 150 MG PO TABS: 150.0000 mg | ORAL_TABLET | Freq: Every day | ORAL | Status: DC

## 2014-03-11 NOTE — Telephone Encounter (Signed)
Called in tarceva refill

## 2014-03-11 NOTE — Telephone Encounter (Signed)
REFILL COMPLETED BY DR.MOHAMED'S NURSE, DIANE BELL,RN.

## 2014-04-20 ENCOUNTER — Other Ambulatory Visit: Payer: Self-pay | Admitting: Cardiovascular Disease

## 2014-04-21 ENCOUNTER — Other Ambulatory Visit (HOSPITAL_BASED_OUTPATIENT_CLINIC_OR_DEPARTMENT_OTHER): Payer: Medicare Other

## 2014-04-21 ENCOUNTER — Ambulatory Visit (HOSPITAL_COMMUNITY)
Admission: RE | Admit: 2014-04-21 | Discharge: 2014-04-21 | Disposition: A | Discharge status: Home / Self Care | Payer: Medicare Other | Source: Ambulatory Visit | Attending: Internal Medicine | Admitting: Internal Medicine

## 2014-04-21 DIAGNOSIS — I2510 Atherosclerotic heart disease of native coronary artery without angina pectoris: Secondary | ICD-10-CM | POA: Insufficient documentation

## 2014-04-21 DIAGNOSIS — C3490 Malignant neoplasm of unspecified part of unspecified bronchus or lung: Secondary | ICD-10-CM

## 2014-04-21 DIAGNOSIS — J438 Other emphysema: Secondary | ICD-10-CM | POA: Diagnosis not present

## 2014-04-21 DIAGNOSIS — I359 Nonrheumatic aortic valve disorder, unspecified: Secondary | ICD-10-CM | POA: Diagnosis not present

## 2014-04-21 DIAGNOSIS — N200 Calculus of kidney: Secondary | ICD-10-CM | POA: Diagnosis not present

## 2014-04-21 DIAGNOSIS — Z923 Personal history of irradiation: Secondary | ICD-10-CM | POA: Diagnosis not present

## 2014-04-21 DIAGNOSIS — C3410 Malignant neoplasm of upper lobe, unspecified bronchus or lung: Secondary | ICD-10-CM

## 2014-04-21 DIAGNOSIS — Z9889 Other specified postprocedural states: Secondary | ICD-10-CM | POA: Insufficient documentation

## 2014-04-21 LAB — CBC WITH DIFFERENTIAL/PLATELET
BASO%: 0.9 % (ref 0.0–2.0)
Basophils Absolute: 0.1 10*3/uL (ref 0.0–0.1)
EOS ABS: 0.1 10*3/uL (ref 0.0–0.5)
EOS%: 1.5 % (ref 0.0–7.0)
HEMATOCRIT: 41.1 % (ref 38.4–49.9)
HGB: 13.5 g/dL (ref 13.0–17.1)
LYMPH%: 20.1 % (ref 14.0–49.0)
MCH: 29.2 pg (ref 27.2–33.4)
MCHC: 32.9 g/dL (ref 32.0–36.0)
MCV: 88.9 fL (ref 79.3–98.0)
MONO#: 0.7 10*3/uL (ref 0.1–0.9)
MONO%: 7.5 % (ref 0.0–14.0)
NEUT%: 70 % (ref 39.0–75.0)
NEUTROS ABS: 6.3 10*3/uL (ref 1.5–6.5)
Platelets: 244 10*3/uL (ref 140–400)
RBC: 4.62 10*6/uL (ref 4.20–5.82)
RDW: 15.7 % — ABNORMAL HIGH (ref 11.0–14.6)
WBC: 9 10*3/uL (ref 4.0–10.3)
lymph#: 1.8 10*3/uL (ref 0.9–3.3)

## 2014-04-21 LAB — COMPREHENSIVE METABOLIC PANEL (CC13)
ALBUMIN: 3.6 g/dL (ref 3.5–5.0)
ALK PHOS: 63 U/L (ref 40–150)
ALT: 21 U/L (ref 0–55)
AST: 19 U/L (ref 5–34)
Anion Gap: 8 mEq/L (ref 3–11)
BILIRUBIN TOTAL: 0.69 mg/dL (ref 0.20–1.20)
BUN: 15.8 mg/dL (ref 7.0–26.0)
CO2: 23 meq/L (ref 22–29)
Calcium: 9.1 mg/dL (ref 8.4–10.4)
Chloride: 106 mEq/L (ref 98–109)
Creatinine: 1.5 mg/dL — ABNORMAL HIGH (ref 0.7–1.3)
GLUCOSE: 165 mg/dL — AB (ref 70–140)
Potassium: 4.8 mEq/L (ref 3.5–5.1)
SODIUM: 138 meq/L (ref 136–145)
Total Protein: 6.9 g/dL (ref 6.4–8.3)

## 2014-04-27 ENCOUNTER — Ambulatory Visit (INDEPENDENT_AMBULATORY_CARE_PROVIDER_SITE_OTHER): Payer: Medicare Other | Admitting: Internal Medicine

## 2014-04-27 ENCOUNTER — Encounter: Payer: Self-pay | Admitting: Internal Medicine

## 2014-04-27 VITALS — BP 136/64 | HR 64 | Ht 72.25 in | Wt 220.0 lb

## 2014-04-27 DIAGNOSIS — Z1211 Encounter for screening for malignant neoplasm of colon: Secondary | ICD-10-CM

## 2014-04-27 DIAGNOSIS — E119 Type 2 diabetes mellitus without complications: Secondary | ICD-10-CM

## 2014-04-27 DIAGNOSIS — I2510 Atherosclerotic heart disease of native coronary artery without angina pectoris: Secondary | ICD-10-CM

## 2014-04-27 NOTE — Patient Instructions (Signed)
Please call the office after Dr. Burt Knack has taken you off Plavix to schedule a colonoscopy

## 2014-04-27 NOTE — Progress Notes (Signed)
HISTORY OF PRESENT ILLNESS:  [name] is a 63 y.o. male with a history of lung cancer on Tarceva, coronary artery disease with coronary artery stents placed 2004 and most recently January 2015. On aspirin and Plavix. He presents today regarding surveillance colonoscopy. He also has a history of diabetes mellitus. His last colonoscopy in 2004 revealed left-sided diverticulosis and hemorrhoids. No family history. No active GI symptoms except for diarrhea secondary to Tarceva. He tells me that he is anticipating coming off Plavix after his one year anniversary in January.  REVIEW OF SYSTEMS:  All non-GI ROS negative upon comprehensive review  Past Medical History  Diagnosis Date  . Diabetes mellitus   . Lung cancer dx'd 2004    chemo/xrt comp; tarceva ongoing  . CAD (coronary artery disease)     a. stenting of RCA/LCx 2004. b. [place] 08/2013:  s/p DES to LCx for severe ISR.  Marland Kitchen HTN (hypertension)   . AAA (abdominal aortic aneurysm)     a. 3.4 x 3.5 on scan 02/28/13.  . Allergic reaction to contrast dye   . Emphysema lung   . Atherosclerosis   . Hepatic steatosis   . Kidney stone     Past Surgical History  Procedure Laterality Date  . Angioplasty / stenting femoral  2015    2004-groin    Social History HERIBERTO STMARTIN [name]  reports that he quit smoking about 11 years ago. He has never used smokeless tobacco. He reports that he does not drink alcohol or use illicit drugs.  family history includes Diabetes in his sister; Heart disease in his sister; Ovarian cancer in his sister. There is no history of Colon cancer, Colon polyps, Kidney disease, or Esophageal cancer.  Allergies  Allergen Reactions  . Iohexol      Code: HIVES, Desc: pt recieves 50mg  benadryl 1 hour prior to scan   . Morphine And Related Anxiety    hallucinations  . Other Rash    chlorascrub when starting IV       PHYSICAL EXAMINATION: Vital signs: BP 136/64  Pulse 64  Ht 6' 0.25" (1.835 m)  Wt 220 lb  (99.791 kg)  BMI 29.64 kg/m2  Constitutional: generally well-appearing, no acute distress Psychiatric: alert and oriented x3, cooperative Eyes: extraocular movements intact, anicteric, conjunctiva pink Mouth: oral pharynx moist, no lesions Neck: supple no lymphadenopathy Cardiovascular: heart regular rate and rhythm, no murmur Lungs: clear to auscultation bilaterally Abdomen: soft, nontender, nondistended, no obvious ascites, no peritoneal signs, normal bowel sounds, no organomegaly Rectal: Deferred to colonoscopy Extremities: no lower extremity edema bilaterally Skin: no lesions on visible extremities Neuro: No focal deficits.   ASSESSMENT:  #1. Screening colonoscopy. Negative index exam 2004. Appropriate candidate without contraindication #2. Coronary artery disease with coronary artery stents. Most recent January 2015. On aspirin and Plavix #3. History of lung cancer status post neoadjuvant chemoradiation therapy and partial lobectomy. Now on Tarceva #4. Diabetes mellitus. On oral agents   PLAN:  #1. We will plan colonoscopy. However, we should wait until he has been on his Plavix for 1 year. He tells me that it will be stopped at that time. It would be appropriate for him to contact the office to set up outpatient colonoscopy after coming off Plavix. As well we would hold his diabetic medicines for his procedure. He understands. He can be directly scheduled with previsit nurse at that time

## 2014-04-28 ENCOUNTER — Ambulatory Visit: Payer: Medicare Other | Admitting: Internal Medicine

## 2014-04-29 ENCOUNTER — Telehealth: Payer: Self-pay | Admitting: *Deleted

## 2014-04-29 NOTE — Telephone Encounter (Signed)
onc tx schedule filled out to r/s appt that was missed 04/28/14.  SLJ

## 2014-04-30 ENCOUNTER — Telehealth: Payer: Self-pay | Admitting: Internal Medicine

## 2014-04-30 NOTE — Telephone Encounter (Signed)
s.w. pt wife and advised on OCT appt....ok and aware

## 2014-05-08 ENCOUNTER — Other Ambulatory Visit: Payer: Self-pay | Admitting: *Deleted

## 2014-05-08 ENCOUNTER — Telehealth: Payer: Self-pay | Admitting: *Deleted

## 2014-05-08 DIAGNOSIS — Z85118 Personal history of other malignant neoplasm of bronchus and lung: Secondary | ICD-10-CM

## 2014-05-08 MED ORDER — ERLOTINIB HCL 150 MG PO TABS: 150.0000 mg | ORAL_TABLET | Freq: Every day | ORAL | Status: DC

## 2014-05-08 NOTE — Telephone Encounter (Addendum)
[facility] faxed Tarceva refill request.  Request to provider's desk/in-basket for review.  Also notified [place].  [name] can provide service and will contact patient to determine co=pay assistance.

## 2014-05-11 ENCOUNTER — Other Ambulatory Visit: Payer: Self-pay | Admitting: Medical Oncology

## 2014-05-11 ENCOUNTER — Telehealth: Payer: Self-pay | Admitting: Medical Oncology

## 2014-05-11 NOTE — Telephone Encounter (Signed)
Pt requests that Tar[facility] be filled [facility] and his other rx to go to [facility] [address] in [place] , not [place]

## 2014-05-12 NOTE — Telephone Encounter (Signed)
[facility] received rx for tarceva.

## 2014-05-19 ENCOUNTER — Ambulatory Visit (HOSPITAL_BASED_OUTPATIENT_CLINIC_OR_DEPARTMENT_OTHER): Payer: Medicare Other | Admitting: Internal Medicine

## 2014-05-19 ENCOUNTER — Encounter: Payer: Self-pay | Admitting: Internal Medicine

## 2014-05-19 ENCOUNTER — Telehealth: Payer: Self-pay | Admitting: Internal Medicine

## 2014-05-19 VITALS — BP 135/71 | HR 86 | Temp 97.8°F | Resp 19 | Ht 72.0 in | Wt 222.5 lb

## 2014-05-19 DIAGNOSIS — I2510 Atherosclerotic heart disease of native coronary artery without angina pectoris: Secondary | ICD-10-CM

## 2014-05-19 DIAGNOSIS — E119 Type 2 diabetes mellitus without complications: Secondary | ICD-10-CM

## 2014-05-19 DIAGNOSIS — I10 Essential (primary) hypertension: Secondary | ICD-10-CM

## 2014-05-19 DIAGNOSIS — C3412 Malignant neoplasm of upper lobe, left bronchus or lung: Secondary | ICD-10-CM

## 2014-05-19 NOTE — Telephone Encounter (Signed)
gv and printed appt sched and avs for pt for April 2016 °

## 2014-05-19 NOTE — Progress Notes (Signed)
[facility] Telephone:([phone]) [phone]   Fax:(336) [phone]  OFFICE PROGRESS NOTE  [name], [address], [address] [place] [address]  DIAGNOSIS: Metastatic non-small cell lung cancer, adenocarcinoma initially diagnosed in December of 2004  PRIOR THERAPY: 1) status post course of concurrent chemoradiation under the care of Dr. Truddie Coco. 2) status post left upper lobe wedge resection on 01/10/2005 for recurrent disease in the left lung.  CURRENT THERAPY: Tarceva 150 mg by mouth daily started in 2006.  DISEASE STAGE: Stage IV  CHEMOTHERAPY INTENT: Palliative  CURRENT # OF CHEMOTHERAPY CYCLES: 9 years  CURRENT ANTIEMETICS: none  CURRENT SMOKING STATUS: Former smoker, quit January 2004  ORAL CHEMOTHERAPY AND CONSENT: Yes  CURRENT BISPHOSPHONATES USE: None  PAIN MANAGEMENT: None  NARCOTICS INDUCED CONSTIPATION: None  LIVING WILL AND CODE STATUS: Patient states that he requested a DO NOT RESUSCITATE when he was diagnosed in 2004 however now that he is 10 years out from diagnosis he would like to change this to limited code.   INTERVAL HISTORY: [name] 63 y.o. male returns to the clinic today for followup visit. The patient is feeling fine today with no specific complaints. The patient has been on Tarceva 150 mg by mouth daily since 2006. He is tolerating it fairly well with no significant adverse effects. He denied having any significant fever or chills, no nausea or vomiting. The patient denied having any significant chest pain, shortness of breath, cough or hemoptysis. He had repeat CT scan of the chest performed recently and he is here for evaluation and discussion of his scan results. He had a cardiac stent placed recently for recurrent chest pain.  MEDICAL HISTORY: Past Medical History  Diagnosis Date  . Diabetes mellitus   . Lung cancer dx'd 2004    chemo/xrt comp; tarceva ongoing  . CAD (coronary artery disease)     a.  stenting of RCA/LCx 2004. b. [place] 08/2013:  s/p DES to LCx for severe ISR.  Marland Kitchen HTN (hypertension)   . AAA (abdominal aortic aneurysm)     a. 3.4 x 3.5 on scan 02/28/13.  . Allergic reaction to contrast dye   . Emphysema lung   . Atherosclerosis   . Hepatic steatosis   . Kidney stone     ALLERGIES:  is allergic to iohexol; morphine and related; and other.  MEDICATIONS:  Current Outpatient Prescriptions  Medication Sig Dispense Refill  . aspirin 81 MG chewable tablet Chew 81 mg by mouth daily.      Marland Kitchen atorvastatin (LIPITOR) 40 MG tablet Take 40 mg by mouth daily.      . clopidogrel (PLAVIX) 75 MG tablet Take 1 tablet (75 mg total) by mouth daily.  90 tablet  2  . erlotinib (TARCEVA) 150 MG tablet Take 1 tablet (150 mg total) by mouth daily. Take on an empty stomach 1 hour before meals or 2 hours after.  30 tablet  0  . glimepiride (AMARYL) 4 MG tablet Take 4 mg by mouth 2 (two) times daily.      . isosorbide mononitrate (IMDUR) 30 MG 24 hr tablet TAKE 1 TABLET BY MOUTH ONCE DAILY  90 tablet  1  . Loperamide HCl (IMODIUM A-D) 1 MG/7.5ML LIQD Take by mouth as needed.      Marland Kitchen losartan (COZAAR) 50 MG tablet TAKE 1 TABLET BY MOUTH EVERY DAY  90 tablet  0  . metFORMIN (GLUCOPHAGE) 850 MG tablet Take 1 tablet (850  mg total) by mouth 2 (two) times daily with a meal.      . metoprolol succinate (TOPROL-XL) 100 MG 24 hr tablet Take 1 tablet (100 mg total) by mouth daily. Take with or immediately following a meal.  1 tablet  0  . nitroGLYCERIN (NITROSTAT) 0.4 MG SL tablet Place 1 tablet (0.4 mg total) under the tongue every 5 (five) minutes as needed for chest pain (up to 3 doses).  25 tablet  3  . pantoprazole (PROTONIX) 40 MG tablet Take 1 tablet (40 mg total) by mouth daily.  90 tablet  1   No current facility-administered medications for this visit.    REVIEW OF SYSTEMS:  Constitutional: negative Eyes: negative Ears, nose, mouth, throat, and face: negative Respiratory: negative Cardiovascular:  negative Gastrointestinal: negative Genitourinary:negative Integument/breast: negative Hematologic/lymphatic: negative Musculoskeletal:negative Neurological: negative Behavioral/Psych: negative Endocrine: negative Allergic/Immunologic: negative   PHYSICAL EXAMINATION: General appearance: alert, cooperative and no distress Head: Normocephalic, without obvious abnormality, atraumatic Neck: no adenopathy Lymph nodes: Cervical, supraclavicular, and axillary nodes normal. Resp: clear to auscultation bilaterally Cardio: regular rate and rhythm, S1, S2 normal, no murmur, click, rub or gallop GI: soft, non-tender; bowel sounds normal; no masses,  no organomegaly Extremities: extremities normal, atraumatic, no cyanosis or edema Neurologic: Alert and oriented X 3, normal strength and tone. Normal symmetric reflexes. Normal coordination and gait  ECOG PERFORMANCE STATUS: 0 - Asymptomatic  Blood pressure 135/71, pulse 86, temperature 97.8 F (36.6 C), temperature source Oral, resp. rate 19, height 6' (1.829 m), weight 222 lb 8 oz (100.925 kg).  LABORATORY DATA: Lab Results  Component Value Date   WBC 9.0 04/21/2014   HGB 13.5 04/21/2014   HCT 41.1 04/21/2014   MCV 88.9 04/21/2014   PLT 244 04/21/2014      Chemistry      Component Value Date/Time   NA 138 04/21/2014 1035   NA 141 09/09/2013 0715   NA 135 12/06/2010 0819   K 4.8 04/21/2014 1035   K 5.3 09/09/2013 0715   K 4.4 12/06/2010 0819   CL 101 09/09/2013 0715   CL 106 06/10/2012 0900   CL 97* 12/06/2010 0819   CO2 23 04/21/2014 1035   CO2 26 09/09/2013 0715   CO2 26 12/06/2010 0819   BUN 15.8 04/21/2014 1035   BUN 15 09/09/2013 0715   BUN 17 12/06/2010 0819   CREATININE 1.5* 04/21/2014 1035   CREATININE 1.30 09/09/2013 0715   CREATININE 1.3* 12/06/2010 0819      Component Value Date/Time   CALCIUM 9.1 04/21/2014 1035   CALCIUM 9.4 09/09/2013 0715   CALCIUM 9.3 12/06/2010 0819   ALKPHOS 63 04/21/2014 1035   ALKPHOS 92* 12/06/2010 0819   ALKPHOS 71  06/07/2010 0814   AST 19 04/21/2014 1035   AST 30 12/06/2010 0819   AST 26 06/07/2010 0814   ALT 21 04/21/2014 1035   ALT 55* 12/06/2010 0819   ALT 39 06/07/2010 0814   BILITOT 0.69 04/21/2014 1035   BILITOT 1.10 12/06/2010 0819   BILITOT 1.7* 06/07/2010 0814       RADIOGRAPHIC STUDIES: Ct Chest Wo Contrast  04/21/2014   CLINICAL DATA:  History of lung cancer. Radiation therapy complete. Chemotherapy ongoing (Tarceva).  EXAM: CT CHEST WITHOUT CONTRAST  TECHNIQUE: Multidetector CT imaging of the chest was performed following the standard protocol without IV contrast.  COMPARISON:  Chest CT 10/14/2013.  FINDINGS: Mediastinum: Heart size is normal. There is no significant pericardial fluid, thickening or pericardial calcification. There is  atherosclerosis of the thoracic aorta, the great vessels of the mediastinum and the coronary arteries, including calcified atherosclerotic plaque in the left main, left anterior descending, left circumflex and right coronary arteries. No pathologically enlarged mediastinal or hilar lymph nodes. Please note that accurate exclusion of hilar adenopathy is limited on noncontrast CT scans. Severe calcifications of the aortic valve. Esophagus is unremarkable in appearance.  Lungs/Pleura: Postoperative changes of wedge resection in the posterior aspect of the left upper lobe abutting the major fissure are again noted. There continues to be one area of nodular thickening adjacent to this suture line (image 26 of series 5) which measures up to 11 mm on today's examination, stable to slightly decreased in prominence compared to prior studies, presumably some mild postoperative scarring. No convincing evidence to suggest local recurrence of disease. No suspicious appearing pulmonary nodules or masses are otherwise noted to suggest metastatic disease in the lungs at this time. Moderate centrilobular and paraseptal emphysema, most apparent in the lung apices. No acute consolidative airspace  disease. Trace amount of right-sided pleural fluid and/or thickening, unchanged compared to prior examinations.  Upper Abdomen: Multiple nonobstructive calculi within the visualized upper pole collecting systems of the kidneys bilaterally measuring 2-3 mm. Sub cm low-attenuation lesion in and segment 6 of the liver is incompletely characterized on today's noncontrast CT examination, but is similar to prior examinations, presumably a small cyst.  Musculoskeletal: Old healed fractures of the posterior aspects of the right seventh and eighth ribs are again noted. There are no aggressive appearing lytic or blastic lesions noted in the visualized portions of the skeleton.  IMPRESSION: 1. Status post wedge resection in the left upper lobe with small amount of postoperative scarring in this region, but no findings to suggest local recurrence of disease or metastatic disease to the thorax. 2. Atherosclerosis, including left main and 3 vessel coronary artery disease. Please note that although the presence of coronary artery calcium documents the presence of coronary artery disease, the severity of this disease and any potential stenosis cannot be assessed on this non-gated CT examination. Assessment for potential risk factor modification, dietary therapy or pharmacologic therapy may be warranted, if clinically indicated. 3. Moderate centrilobular and paraseptal emphysema. 4. Old healed fractures of the posterior right seventh and eighth ribs, adjacent which there is a trace amount of chronic pleural fluid and/or thickening which is unchanged. 5. There are calcifications of the aortic valve. Echocardiographic correlation for evaluation of potential valvular dysfunction may be warranted if clinically indicated. 6. Multiple nonobstructive calculi in the upper pole collecting systems of the kidneys bilaterally.   Electronically Signed   By: Vinnie Langton M.D.   On: 04/21/2014 11:44   ASSESSMENT/PLAN: This is a very pleasant  63 years old white male with history of metastatic non-small cell lung cancer, adenocarcinoma. He is currently on treatment with Tarceva and tolerating it fairly well. The patient is doing fine today with no specific complaints.  Repeat CT scan of the chest performed recently showed no evidence for disease recurrence.  I discussed the scan results with the patient today. I recommended for him to continue on Tarceva with the current dose with a followup visit and repeat CT scan of the chest in 6 month He was advised to call immediately if he has any concerning symptoms in the interval.  All questions were answered. The patient knows to call the clinic with any problems, questions or concerns. We can certainly see the patient much sooner if necessary.  Disclaimer: This note was dictated with voice recognition software. Similar sounding words can inadvertently be transcribed and may not be corrected upon review.

## 2014-05-25 ENCOUNTER — Other Ambulatory Visit: Payer: Self-pay | Admitting: Cardiovascular Disease

## 2014-06-09 ENCOUNTER — Other Ambulatory Visit: Payer: Self-pay | Admitting: *Deleted

## 2014-06-09 NOTE — Telephone Encounter (Signed)
THIS REFILL REQUEST FOR TARCEVA WAS GIVEN TO DR.MOHAMED'S NURSE, DIANE BELL,RN.

## 2014-06-10 ENCOUNTER — Other Ambulatory Visit: Payer: Self-pay | Admitting: *Deleted

## 2014-06-10 ENCOUNTER — Telehealth: Payer: Self-pay | Admitting: Medical Oncology

## 2014-06-10 ENCOUNTER — Other Ambulatory Visit: Payer: Self-pay | Admitting: Medical Oncology

## 2014-06-10 DIAGNOSIS — Z85118 Personal history of other malignant neoplasm of bronchus and lung: Secondary | ICD-10-CM

## 2014-06-10 MED ORDER — ERLOTINIB HCL 150 MG PO TABS: 150.0000 mg | ORAL_TABLET | Freq: Every day | ORAL | Status: DC

## 2014-06-10 NOTE — Addendum Note (Signed)
Addended by: Ardeen Garland on: 06/10/2014 04:23 PM   Modules accepted: Orders

## 2014-06-10 NOTE — Telephone Encounter (Signed)
I called local [facility] and cancelled  tarceva refill . I faxed it to [facility]

## 2014-06-10 NOTE — Telephone Encounter (Signed)
Refill request for Tarceva to MD desk for review.

## 2014-06-11 NOTE — Telephone Encounter (Signed)
RECEIVED A FAX FROM [facility] CONCERNING A CONFIRMATION OF FACSIMILE RECEIPT FOR PT. REFERRAL. 

## 2014-07-03 ENCOUNTER — Other Ambulatory Visit: Payer: Self-pay

## 2014-07-03 DIAGNOSIS — Z85118 Personal history of other malignant neoplasm of bronchus and lung: Secondary | ICD-10-CM

## 2014-07-03 MED ORDER — ERLOTINIB HCL 150 MG PO TABS: 150.0000 mg | ORAL_TABLET | Freq: Every day | ORAL | Status: DC

## 2014-07-10 NOTE — Telephone Encounter (Signed)
RECEIVED A FAX FROM [facility] CONCERNING A CONFIRMATION OF PRESCRIPTION SHIPMENT FOR [facility] ON 07/08/14.

## 2014-07-23 ENCOUNTER — Encounter (HOSPITAL_COMMUNITY): Payer: Self-pay | Admitting: Cardiovascular Disease

## 2014-08-07 ENCOUNTER — Other Ambulatory Visit: Payer: Self-pay | Admitting: Cardiovascular Disease

## 2014-10-08 ENCOUNTER — Other Ambulatory Visit: Payer: Self-pay | Admitting: Medical Oncology

## 2014-10-08 DIAGNOSIS — Z85118 Personal history of other malignant neoplasm of bronchus and lung: Secondary | ICD-10-CM

## 2014-10-08 MED ORDER — ERLOTINIB HCL 150 MG PO TABS: 150.0000 mg | ORAL_TABLET | Freq: Every day | ORAL | Status: DC

## 2014-10-08 NOTE — Progress Notes (Signed)
[facility] faxed confirmation of facsimile receipt for Tarceva prescription referral.  [facility] will verify insurance and make delivery arrangements with patient.

## 2014-10-14 DIAGNOSIS — Z0000 Encounter for general adult medical examination without abnormal findings: Secondary | ICD-10-CM | POA: Diagnosis not present

## 2014-10-14 DIAGNOSIS — Z6829 Body mass index (BMI) 29.0-29.9, adult: Secondary | ICD-10-CM | POA: Diagnosis not present

## 2014-10-14 DIAGNOSIS — I2510 Atherosclerotic heart disease of native coronary artery without angina pectoris: Secondary | ICD-10-CM | POA: Diagnosis not present

## 2014-10-14 DIAGNOSIS — E780 Pure hypercholesterolemia: Secondary | ICD-10-CM | POA: Diagnosis not present

## 2014-10-14 DIAGNOSIS — R972 Elevated prostate specific antigen [PSA]: Secondary | ICD-10-CM | POA: Diagnosis not present

## 2014-10-14 DIAGNOSIS — E119 Type 2 diabetes mellitus without complications: Secondary | ICD-10-CM | POA: Diagnosis not present

## 2014-10-14 DIAGNOSIS — D0220 Carcinoma in situ of unspecified bronchus and lung: Secondary | ICD-10-CM | POA: Diagnosis not present

## 2014-10-14 DIAGNOSIS — E118 Type 2 diabetes mellitus with unspecified complications: Secondary | ICD-10-CM | POA: Diagnosis not present

## 2014-10-14 DIAGNOSIS — D559 Anemia due to enzyme disorder, unspecified: Secondary | ICD-10-CM | POA: Diagnosis not present

## 2014-10-14 DIAGNOSIS — D291 Benign neoplasm of prostate: Secondary | ICD-10-CM | POA: Diagnosis not present

## 2014-10-20 ENCOUNTER — Ambulatory Visit (INDEPENDENT_AMBULATORY_CARE_PROVIDER_SITE_OTHER): Payer: Medicare Other | Admitting: Cardiovascular Disease

## 2014-10-20 ENCOUNTER — Encounter: Payer: Self-pay | Admitting: Cardiovascular Disease

## 2014-10-20 VITALS — BP 130/72 | HR 101 | Ht 73.0 in | Wt 216.2 lb

## 2014-10-20 DIAGNOSIS — I714 Abdominal aortic aneurysm, without rupture, unspecified: Secondary | ICD-10-CM

## 2014-10-20 DIAGNOSIS — I10 Essential (primary) hypertension: Secondary | ICD-10-CM

## 2014-10-20 DIAGNOSIS — I2510 Atherosclerotic heart disease of native coronary artery without angina pectoris: Secondary | ICD-10-CM

## 2014-10-20 NOTE — Progress Notes (Signed)
Cardiology Office Note   Date:  10/20/2014   ID:  REGGIE BISE [name], DOB 06/07/1951, MRN [id number]  PCP:  Criselda Peaches, MD  Cardiologist:  Sherren Mocha, MD    No chief complaint on file.    History of Present Illness: [name] is a 64 y.o. male who presents for follow-up of CAD. He has had PCI of the RCA and LCx in 2004 and did well until January 2015 when he presented with progressive angina. He was found to have severe ISR in the circumflex and underwent PCI with a drug-eluting stent.  He has transient chest tightness when he walks out in the cold weather, but no symptoms related to exertion. He presented last January with exertional angina and this has resolved since he had PCI. Overall he is feeling well at present.  The patient is due for repeat colonoscopy. It would be acceptable for him to hold clopidogrel for 5 days prior to the procedure. He should resume this as soon as it is safe from a bleeding standpoint after his colonoscopy is completed.  He denies bleeding problems. No edema, DOE, palpitations, or other cardiac-related complaints.   Past Medical History  Diagnosis Date  . Diabetes mellitus   . Lung cancer dx'd 2004    chemo/xrt comp; tarceva ongoing  . CAD (coronary artery disease)     a. stenting of RCA/LCx 2004. b. [place] 08/2013:  s/p DES to LCx for severe ISR.  Marland Kitchen HTN (hypertension)   . AAA (abdominal aortic aneurysm)     a. 3.4 x 3.5 on scan 02/28/13.  . Allergic reaction to contrast dye   . Emphysema lung   . Atherosclerosis   . Hepatic steatosis   . Kidney stone     Past Surgical History  Procedure Laterality Date  . Angioplasty / stenting femoral  2015    2004-groin  . Left heart catheterization with coronary angiogram N/A 09/08/2013    Procedure: LEFT HEART CATHETERIZATION WITH CORONARY ANGIOGRAM;  Surgeon: Blane Ohara, MD;  Location: [facility] CATH LAB;  Service: Cardiovascular;  Laterality: N/A;    Current Outpatient Prescriptions    Medication Sig Dispense Refill  . aspirin 81 MG chewable tablet Chew 81 mg by mouth daily.    Marland Kitchen atorvastatin (LIPITOR) 40 MG tablet Take 40 mg by mouth daily.    . clopidogrel (PLAVIX) 75 MG tablet TAKE 1 TABLET (75 MG TOTAL) BY MOUTH DAILY. 90 tablet 2  . erlotinib (TARCEVA) 150 MG tablet Take 1 tablet (150 mg total) by mouth daily. Take on an empty stomach 1 hour before meals or 2 hours after. 30 tablet 2  . glimepiride (AMARYL) 4 MG tablet Take 4 mg by mouth 2 (two) times daily.    . isosorbide mononitrate (IMDUR) 30 MG 24 hr tablet TAKE 1 TABLET BY MOUTH ONCE DAILY 90 tablet 1  . Loperamide HCl (IMODIUM A-D) 1 MG/7.5ML LIQD Take by mouth as needed.    Marland Kitchen losartan (COZAAR) 50 MG tablet TAKE 1 TABLET BY MOUTH EVERY DAY 90 tablet 3  . metFORMIN (GLUCOPHAGE) 850 MG tablet Take 1 tablet (850 mg total) by mouth 2 (two) times daily with a meal.    . metoprolol (LOPRESSOR) 100 MG tablet Take 100 mg by mouth daily. Take one tablet by mouth daily with or following a meal  4  . nitroGLYCERIN (NITROSTAT) 0.4 MG SL tablet Place 1 tablet (0.4 mg total) under the tongue every 5 (five) minutes as needed  for chest pain (up to 3 doses). 25 tablet 3  . pantoprazole (PROTONIX) 40 MG tablet Take 1 tablet (40 mg total) by mouth daily. 90 tablet 1   No current facility-administered medications for this visit.    Allergies:   Iohexol; Morphine and related; and Other   Social History:  The patient  reports that he quit smoking about 12 years ago. He has never used smokeless tobacco. He reports that he does not drink alcohol or use illicit drugs.   Family History:  The patient's  family history includes Diabetes in his sister; Heart disease in his sister; Ovarian cancer in his sister. There is no history of Colon cancer, Colon polyps, Kidney disease, or Esophageal cancer.    ROS:  Please see the history of present illness.  All other systems are reviewed and negative.   PHYSICAL EXAM: VS:  BP 130/72 mmHg   Pulse 101  Ht 6\' 1"  (1.854 m)  Wt 216 lb 3.2 oz (98.068 kg)  BMI 28.53 kg/m2  SpO2 95% , BMI Body mass index is 28.53 kg/(m^2). GEN: Well nourished, well developed, in no acute distress HEENT: normal Neck: no JVD, no masses, no carotid bruits Cardiac: RRR without murmur or gallop                Respiratory:  clear to auscultation bilaterally, normal work of breathing GI: soft, nontender, nondistended, + BS MS: no deformity or atrophy Ext: no pretibial edema Skin: warm and dry, no rash Neuro:  Strength and sensation are intact Psych: euthymic mood, full affect  EKG:  EKG is ordered today. The ekg ordered today shows NSR 98 bpm, within normal limits.  Recent Labs: 04/21/2014: ALT 21; BUN 15.8; Creatinine 1.5*; Hemoglobin 13.5; Platelets 244; Potassium 4.8; Sodium 138   Lipid Panel  No results found for: CHOL, TRIG, HDL, CHOLHDL, VLDL, LDLCALC, LDLDIRECT    Wt Readings from Last 3 Encounters:  10/20/14 216 lb 3.2 oz (98.068 kg)  05/19/14 222 lb 8 oz (100.925 kg)  04/27/14 220 lb (99.791 kg)     Cardiac Studies Reviewed: Cardiac Cath 09/08/2013: Final Conclusions:  1. 2 vessel CAD with patency of the previously implanted RCA stent and severe ISR of the LCx stent.  2. Normal LV function 3. Successful PCI of the LCx with a single DES 4. Patency of the LAD without significant stenosis   Recommendations:  DAPT with ASA and plavix for at least 12 months.  ASSESSMENT AND PLAN: 1.  CAD, native vessel, with rare episodes of angina. The patient is clinically improved since PCI. We'll continue with his current medical management. He is greater than 12 months out from his last procedure, and I think it is safe for him to undergo colonoscopy if necessary. He can hold clopidogrel for 5 days prior to the procedure and resume as soon as it is safe afterwards. I would like him to continue on aspirin 81 mg throughout. We'll see him back in one year for follow-up evaluation.  2. Essential  hypertension: Blood pressure well controlled on current medications which include Lopressor, isosorbide, and losartan.  3. Hyperlipidemia: Treated with atorvastatin. Lipids followed by PCP.   Current medicines are reviewed with the patient today.  The patient does not have concerns regarding medicines.  The following changes have been made:  no change  Labs/ tests ordered today include:  No orders of the defined types were placed in this encounter.    Disposition:   FU one year  Signed,  Sherren Mocha, MD  10/20/2014 3:27 PM    [facility] [address], [place], [place]  [address] Phone: [phone]; Fax: [phone]

## 2014-10-20 NOTE — Patient Instructions (Addendum)
Your physician has requested that you have an abdominal aorta duplex in July. During this test, an ultrasound is used to evaluate the aorta. Allow 30 minutes for this exam. Do not eat after midnight the day before and avoid carbonated beverages.  You are okay to hold plavix 5 days prior to colonoscopy once this test is scheduled. Do not stop Aspirin for colonoscopy.  Resume plavix after colonoscopy.   Your physician wants you to follow-up in: 1 YEAR with Dr Burt Knack.  You will receive a reminder letter in the mail two months in advance. If you don't receive a letter, please call our office to schedule the follow-up appointment.  Your physician recommends that you continue on your current medications as directed. Please refer to the Current Medication list given to you today.

## 2014-11-17 ENCOUNTER — Other Ambulatory Visit (HOSPITAL_BASED_OUTPATIENT_CLINIC_OR_DEPARTMENT_OTHER): Payer: Medicare Other

## 2014-11-17 ENCOUNTER — Ambulatory Visit (HOSPITAL_COMMUNITY)
Admission: RE | Admit: 2014-11-17 | Discharge: 2014-11-17 | Disposition: A | Discharge status: Home / Self Care | Payer: Medicare Other | Source: Ambulatory Visit | Attending: Internal Medicine | Admitting: Internal Medicine

## 2014-11-17 ENCOUNTER — Encounter (HOSPITAL_COMMUNITY): Payer: Self-pay

## 2014-11-17 DIAGNOSIS — Z08 Encounter for follow-up examination after completed treatment for malignant neoplasm: Secondary | ICD-10-CM | POA: Diagnosis not present

## 2014-11-17 DIAGNOSIS — Z902 Acquired absence of lung [part of]: Secondary | ICD-10-CM | POA: Diagnosis not present

## 2014-11-17 DIAGNOSIS — C3412 Malignant neoplasm of upper lobe, left bronchus or lung: Secondary | ICD-10-CM

## 2014-11-17 DIAGNOSIS — R911 Solitary pulmonary nodule: Secondary | ICD-10-CM | POA: Diagnosis not present

## 2014-11-17 DIAGNOSIS — J439 Emphysema, unspecified: Secondary | ICD-10-CM | POA: Diagnosis not present

## 2014-11-17 LAB — COMPREHENSIVE METABOLIC PANEL (CC13)
ALBUMIN: 3.7 g/dL (ref 3.5–5.0)
ALT: 22 U/L (ref 0–55)
AST: 17 U/L (ref 5–34)
Alkaline Phosphatase: 76 U/L (ref 40–150)
Anion Gap: 10 mEq/L (ref 3–11)
BUN: 13.9 mg/dL (ref 7.0–26.0)
CALCIUM: 8.7 mg/dL (ref 8.4–10.4)
CHLORIDE: 107 meq/L (ref 98–109)
CO2: 24 mEq/L (ref 22–29)
Creatinine: 1.6 mg/dL — ABNORMAL HIGH (ref 0.7–1.3)
EGFR: 44 mL/min/{1.73_m2} — ABNORMAL LOW (ref >90)
Glucose: 161 mg/dl — ABNORMAL HIGH (ref 70–140)
POTASSIUM: 4.8 meq/L (ref 3.5–5.1)
Sodium: 142 mEq/L (ref 136–145)
Total Bilirubin: 0.95 mg/dL (ref 0.20–1.20)
Total Protein: 6.7 g/dL (ref 6.4–8.3)

## 2014-11-17 MED ORDER — IOHEXOL 300 MG/ML  SOLN
80.0000 mL | Freq: Once | INTRAMUSCULAR | Status: AC | PRN
  Administered 2014-11-17: 80 mL via INTRAVENOUS

## 2014-11-24 ENCOUNTER — Encounter: Payer: Self-pay | Admitting: Internal Medicine

## 2014-11-24 ENCOUNTER — Ambulatory Visit (HOSPITAL_BASED_OUTPATIENT_CLINIC_OR_DEPARTMENT_OTHER): Payer: Medicare Other | Admitting: Internal Medicine

## 2014-11-24 ENCOUNTER — Telehealth: Payer: Self-pay | Admitting: Internal Medicine

## 2014-11-24 VITALS — BP 135/74 | HR 86 | Temp 98.0°F | Resp 18 | Ht 73.0 in | Wt 218.5 lb

## 2014-11-24 DIAGNOSIS — C3412 Malignant neoplasm of upper lobe, left bronchus or lung: Secondary | ICD-10-CM | POA: Diagnosis not present

## 2014-11-24 NOTE — Progress Notes (Signed)
[facility] Telephone:([phone]   Fax:(336) [phone]  OFFICE PROGRESS NOTE  [address], [name], [address], [address] [place] [address]  DIAGNOSIS: Metastatic non-small cell lung cancer, adenocarcinoma initially diagnosed in December of 2004  PRIOR THERAPY: 1) status post course of concurrent chemoradiation under the care of Dr. Truddie Coco. 2) status post left upper lobe wedge resection on 01/10/2005 for recurrent disease in the left lung.  CURRENT THERAPY: Tarceva 150 mg by mouth daily started in 2006.  DISEASE STAGE: Stage IV  CHEMOTHERAPY INTENT: Palliative  CURRENT # OF CHEMOTHERAPY CYCLES: 10 years  CURRENT ANTIEMETICS: none  CURRENT SMOKING STATUS: Former smoker, quit January 2004  ORAL CHEMOTHERAPY AND CONSENT: Yes  CURRENT BISPHOSPHONATES USE: None  PAIN MANAGEMENT: None  NARCOTICS INDUCED CONSTIPATION: None  LIVING WILL AND CODE STATUS: Patient states that he requested a DO NOT RESUSCITATE when he was diagnosed in 2004 however now that he is 10 years out from diagnosis he would like to change this to limited code.   INTERVAL HISTORY: [name] 64 y.o. male returns to the clinic today for 6 months followup visit. The patient is feeling fine today with no specific complaints. The patient has been on Tarceva 150 mg by mouth daily since 2006. He is tolerating it fairly well with no significant adverse effects except for occasional episodes of diarrhea and mild skin rash. He denied having any significant fever or chills, no nausea or vomiting. The patient denied having any significant chest pain, shortness of breath, cough or hemoptysis. He had repeat CT scan of the chest performed recently and he is here for evaluation and discussion of his scan results.   MEDICAL HISTORY: Past Medical History  Diagnosis Date  . CAD (coronary artery disease)     a. stenting of RCA/LCx 2004. b. [place] 08/2013:  s/p DES to LCx for severe ISR.  Marland Kitchen HTN  (hypertension)   . AAA (abdominal aortic aneurysm)     a. 3.4 x 3.5 on scan 02/28/13.  . Allergic reaction to contrast dye   . Emphysema lung   . Atherosclerosis   . Hepatic steatosis   . Kidney stone   . Diabetes mellitus   . Lung cancer dx'd 2004    chemo/xrt comp; tarceva ongoing    ALLERGIES:  is allergic to iohexol; morphine and related; and other.  MEDICATIONS:  Current Outpatient Prescriptions  Medication Sig Dispense Refill  . aspirin 81 MG chewable tablet Chew 81 mg by mouth daily.    Marland Kitchen atorvastatin (LIPITOR) 40 MG tablet Take 40 mg by mouth daily.    . clopidogrel (PLAVIX) 75 MG tablet TAKE 1 TABLET (75 MG TOTAL) BY MOUTH DAILY. 90 tablet 2  . erlotinib (TARCEVA) 150 MG tablet Take 1 tablet (150 mg total) by mouth daily. Take on an empty stomach 1 hour before meals or 2 hours after. 30 tablet 2  . glimepiride (AMARYL) 4 MG tablet Take 4 mg by mouth 2 (two) times daily.    . isosorbide mononitrate (IMDUR) 30 MG 24 hr tablet TAKE 1 TABLET BY MOUTH ONCE DAILY 90 tablet 1  . Loperamide HCl (IMODIUM A-D) 1 MG/7.5ML LIQD Take by mouth as needed.    Marland Kitchen losartan (COZAAR) 50 MG tablet TAKE 1 TABLET BY MOUTH EVERY DAY 90 tablet 3  . metFORMIN (GLUCOPHAGE) 850 MG tablet Take 1 tablet (850 mg total) by mouth 2 (two) times daily with a meal.    .  metoprolol (LOPRESSOR) 100 MG tablet Take 100 mg by mouth daily. Take one tablet by mouth daily with or following a meal  4  . pantoprazole (PROTONIX) 40 MG tablet Take 1 tablet (40 mg total) by mouth daily. 90 tablet 1  . nitroGLYCERIN (NITROSTAT) 0.4 MG SL tablet Place 1 tablet (0.4 mg total) under the tongue every 5 (five) minutes as needed for chest pain (up to 3 doses). (Patient not taking: Reported on 11/24/2014) 25 tablet 3   No current facility-administered medications for this visit.    REVIEW OF SYSTEMS:  Constitutional: negative Eyes: negative Ears, nose, mouth, throat, and face: negative Respiratory: negative Cardiovascular:  negative Gastrointestinal: negative Genitourinary:negative Integument/breast: negative Hematologic/lymphatic: negative Musculoskeletal:negative Neurological: negative Behavioral/Psych: negative Endocrine: negative Allergic/Immunologic: negative   PHYSICAL EXAMINATION: General appearance: alert, cooperative and no distress Head: Normocephalic, without obvious abnormality, atraumatic Neck: no adenopathy Lymph nodes: Cervical, supraclavicular, and axillary nodes normal. Resp: clear to auscultation bilaterally Cardio: regular rate and rhythm, S1, S2 normal, no murmur, click, rub or gallop GI: soft, non-tender; bowel sounds normal; no masses,  no organomegaly Extremities: extremities normal, atraumatic, no cyanosis or edema Neurologic: Alert and oriented X 3, normal strength and tone. Normal symmetric reflexes. Normal coordination and gait  ECOG PERFORMANCE STATUS: 0 - Asymptomatic  Blood pressure 135/74, pulse 86, temperature 98 F (36.7 C), temperature source Oral, resp. rate 18, height 6\' 1"  (1.854 m), weight 218 lb 8 oz (99.111 kg), SpO2 100 %.  LABORATORY DATA: Lab Results  Component Value Date   WBC 9.0 04/21/2014   HGB 13.5 04/21/2014   HCT 41.1 04/21/2014   MCV 88.9 04/21/2014   PLT 244 04/21/2014      Chemistry      Component Value Date/Time   NA 142 11/17/2014 1015   NA 141 09/09/2013 0715   NA 135 12/06/2010 0819   K 4.8 11/17/2014 1015   K 5.3 09/09/2013 0715   K 4.4 12/06/2010 0819   CL 101 09/09/2013 0715   CL 106 06/10/2012 0900   CL 97* 12/06/2010 0819   CO2 24 11/17/2014 1015   CO2 26 09/09/2013 0715   CO2 26 12/06/2010 0819   BUN 13.9 11/17/2014 1015   BUN 15 09/09/2013 0715   BUN 17 12/06/2010 0819   CREATININE 1.6* 11/17/2014 1015   CREATININE 1.30 09/09/2013 0715   CREATININE 1.3* 12/06/2010 0819      Component Value Date/Time   CALCIUM 8.7 11/17/2014 1015   CALCIUM 9.4 09/09/2013 0715   CALCIUM 9.3 12/06/2010 0819   ALKPHOS 76 11/17/2014  1015   ALKPHOS 92* 12/06/2010 0819   ALKPHOS 71 06/07/2010 0814   AST 17 11/17/2014 1015   AST 30 12/06/2010 0819   AST 26 06/07/2010 0814   ALT 22 11/17/2014 1015   ALT 55* 12/06/2010 0819   ALT 39 06/07/2010 0814   BILITOT 0.95 11/17/2014 1015   BILITOT 1.10 12/06/2010 0819   BILITOT 1.7* 06/07/2010 0814       RADIOGRAPHIC STUDIES: Ct Chest W Contrast  11/17/2014   CLINICAL DATA:  Patient with history of lung cancer diagnosed 2004. Prior left upper lobectomy.  EXAM: CT CHEST WITH CONTRAST  TECHNIQUE: Multidetector CT imaging of the chest was performed during intravenous contrast administration.  CONTRAST:  61mL OMNIPAQUE IOHEXOL 300 MG/ML  SOLN  COMPARISON:  Chest CT 04/21/2014  FINDINGS: Mediastinum/Nodes: No enlarged axillary, mediastinal or hilar lymphadenopathy. Normal heart size. No pericardial effusion. Aorta main pulmonary artery normal in caliber.  Lungs/Pleura: Postoperative  changes compatible with partial left upper lobectomy. Stable nodularity along the surgical site measuring 10.4 mm (image 26; series 5), previously 10.9 mm. Unchanged 4 mm right lower lobe pulmonary nodule (image 37; series 5). Centrilobular and paraseptal emphysematous change. No pleural effusion or pneumothorax.  Upper abdomen: Multiple nonobstructing stones within the superior pole of the kidneys bilaterally. Unchanged sub cm low-attenuation lesion posterior right hepatic lobe.  Musculoskeletal: Old healed fractures of the posterior right seventh and eighth ribs. No aggressive or acute appearing osseous lesions.  IMPRESSION: 1. Stable postoperative changes compatible with wedge resection within the left upper lobe with adjacent postoperative scarring. No CT evidence to suggest local recurrence or metastatic disease. 2. Unchanged 4 mm right lower lobe pulmonary nodule, attention on followup is recommended. 3. Emphysematous change.   Electronically Signed   By: Lovey Newcomer M.D.   On: 11/17/2014 13:36     ASSESSMENT/PLAN: This is a very pleasant 64 years old white male with history of metastatic non-small cell lung cancer, adenocarcinoma. He is currently on treatment with Tarceva for the last 10 years and tolerating it fairly well. The patient is doing fine today with no specific complaints.  The recent CT scan of the chest performed recently showed no evidence for disease recurrence.  I discussed the scan results with the patient today. I recommended for him to continue on Tarceva with the current dose with a followup visit and repeat blood work in 6 month He was advised to call immediately if he has any concerning symptoms in the interval.  All questions were answered. The patient knows to call the clinic with any problems, questions or concerns. We can certainly see the patient much sooner if necessary.  Disclaimer: This note was dictated with voice recognition software. Similar sounding words can inadvertently be transcribed and may not be corrected upon review.

## 2014-11-24 NOTE — Telephone Encounter (Signed)
s.w. pt and advised on OCT appt....ok and aware

## 2014-12-17 ENCOUNTER — Other Ambulatory Visit (HOSPITAL_COMMUNITY): Payer: Self-pay | Admitting: Cardiovascular Disease

## 2014-12-17 DIAGNOSIS — I714 Abdominal aortic aneurysm, without rupture, unspecified: Secondary | ICD-10-CM

## 2015-01-04 ENCOUNTER — Other Ambulatory Visit: Payer: Self-pay | Admitting: *Deleted

## 2015-01-04 DIAGNOSIS — Z85118 Personal history of other malignant neoplasm of bronchus and lung: Secondary | ICD-10-CM

## 2015-01-04 MED ORDER — ERLOTINIB HCL 150 MG PO TABS: 150.0000 mg | ORAL_TABLET | Freq: Every day | ORAL | Status: DC

## 2015-01-04 NOTE — Telephone Encounter (Signed)
Tarceva escribed and faxed to [facility]

## 2015-01-26 DIAGNOSIS — E118 Type 2 diabetes mellitus with unspecified complications: Secondary | ICD-10-CM | POA: Diagnosis not present

## 2015-01-26 DIAGNOSIS — I2510 Atherosclerotic heart disease of native coronary artery without angina pectoris: Secondary | ICD-10-CM | POA: Diagnosis not present

## 2015-01-26 DIAGNOSIS — D0220 Carcinoma in situ of unspecified bronchus and lung: Secondary | ICD-10-CM | POA: Diagnosis not present

## 2015-02-22 ENCOUNTER — Telehealth: Payer: Self-pay

## 2015-02-22 ENCOUNTER — Encounter: Payer: Self-pay | Admitting: Internal Medicine

## 2015-02-22 ENCOUNTER — Ambulatory Visit (INDEPENDENT_AMBULATORY_CARE_PROVIDER_SITE_OTHER): Payer: Medicare Other | Admitting: Internal Medicine

## 2015-02-22 VITALS — BP 132/68 | Ht 72.25 in | Wt 213.1 lb

## 2015-02-22 DIAGNOSIS — I2510 Atherosclerotic heart disease of native coronary artery without angina pectoris: Secondary | ICD-10-CM

## 2015-02-22 DIAGNOSIS — Z1211 Encounter for screening for malignant neoplasm of colon: Secondary | ICD-10-CM | POA: Diagnosis not present

## 2015-02-22 DIAGNOSIS — E119 Type 2 diabetes mellitus without complications: Secondary | ICD-10-CM | POA: Diagnosis not present

## 2015-02-22 MED ORDER — NA SULFATE-K SULFATE-MG SULF 17.5-3.13-1.6 GM/177ML PO SOLN: 1.0000 | Freq: Once | ORAL | Status: DC

## 2015-02-22 NOTE — Patient Instructions (Signed)
You have been scheduled for a colonoscopy. Please follow written instructions given to you at your visit today.  Please pick up your prep supplies at the pharmacy within the next 1-3 days. If you use inhalers (even only as needed), please bring them with you on the day of your procedure.   

## 2015-02-22 NOTE — Progress Notes (Signed)
HISTORY OF PRESENT ILLNESS:  [name] is a 64 y.o. male with past medical history as listed below including coronary artery disease for which she last underwent coronary artery stent placement January 2015 (on Plavix and aspirin), diabetes mellitus on oral agents, and lung cancer diagnosed in 2004. He was last evaluated September 2015 regarding screening colonoscopy. At that time he was asymptomatic. His previous examination 2004 revealed diverticulosis. As he was less than 1 year out from his stent placement and on Plavix we elected to wait until his anniversary. He did see his cardiologist, Dr. Sherren Mocha in March of this year. He was cleared for holding Plavix for his colonoscopy. Patient's GI review of systems are negative. He does have GERD for which he takes PPI on a regular basis. No dysphagia or other issues.  REVIEW OF SYSTEMS:  All non-GI ROS negative except for joint aches  Past Medical History  Diagnosis Date  . CAD (coronary artery disease)     a. stenting of RCA/LCx 2004. b. [place] 08/2013:  s/p DES to LCx for severe ISR.  Marland Kitchen HTN (hypertension)   . AAA (abdominal aortic aneurysm)     a. 3.4 x 3.5 on scan 02/28/13.  . Allergic reaction to contrast dye   . Emphysema lung   . Atherosclerosis   . Hepatic steatosis   . Kidney stone   . Diabetes mellitus   . Lung cancer dx'd 2004    chemo/xrt comp; tarceva ongoing  . Diverticulosis   . Hemorrhoids     Past Surgical History  Procedure Laterality Date  . Angioplasty / stenting femoral  2015    2004-groin  . Left heart catheterization with coronary angiogram N/A 09/08/2013    Procedure: LEFT HEART CATHETERIZATION WITH CORONARY ANGIOGRAM;  Surgeon: Blane Ohara, MD;  Location: [facility] CATH LAB;  Service: Cardiovascular;  Laterality: N/A;    [name]  reports that he quit smoking about 12 years ago. He has never used smokeless tobacco. He reports that he does not drink alcohol or use illicit  drugs.  family history includes Diabetes in his sister; Heart disease in his sister; Ovarian cancer in his sister. There is no history of Colon cancer, Colon polyps, Kidney disease, or Esophageal cancer.  Allergies  Allergen Reactions  . Iohexol Other (See Comments)     Code: HIVES, Desc: pt recieves '50mg'$  benadryl 1 hour prior to scan   . Morphine And Related Anxiety    hallucinations  . Other Rash    chlorascrub when starting IV       PHYSICAL EXAMINATION: Vital signs: BP 132/68 mmHg  Ht 6' 0.25" (1.835 m)  Wt 213 lb 2 oz (96.673 kg)  BMI 28.71 kg/m2 General: Well-developed, well-nourished, no acute distress HEENT: Sclerae are anicteric, conjunctiva pink. Oral mucosa intact Lungs: Clear Heart: Regular Abdomen: soft, nontender, nondistended, no obvious ascites, no peritoneal signs, normal bowel sounds. No organomegaly. Extremities: No edema Psychiatric: alert and oriented x3. Cooperative   ASSESSMENT:  #1. Colon cancer screening. Appropriate candidate without contraindication. Higher than baseline risk due to the need to manipulate Plavix therapy for his exam #2. Colonoscopy 2004 with diverticulosis #3. Type 2 diabetes mellitus on oral agents   PLAN:  #1. Colonoscopy.The nature of the procedure, as well as the risks, benefits, and alternatives were carefully and thoroughly reviewed with the patient. Ample time for discussion and questions allowed. The patient understood, was satisfied, and agreed to proceed. #2. Hold Plavix 5-7  days prior to the procedure. Continue aspirin #3. Hold diabetic medications the day of the procedure #4. The patient and questions regarding prep. Recommend Suprep. Prescribed and instructed

## 2015-02-22 NOTE — Telephone Encounter (Signed)
  02/22/2015   RE: [name] DOB: September 13, 1950 MRN: [id number]   Dear Dr. Burt Knack,    We have scheduled the above patient for an endoscopic procedure. Our records show that he is on anticoagulation therapy.   Please advise as to how long the patient may come off his therapy of Plavix prior to the procedure, which is scheduled for 04/20/2015.  Please fax back/ or route the completed form to [facility] at [phone].   Sincerely,    Phillis Haggis

## 2015-02-23 ENCOUNTER — Ambulatory Visit (HOSPITAL_COMMUNITY): Payer: Medicare Other | Attending: Cardiology

## 2015-02-23 DIAGNOSIS — I714 Abdominal aortic aneurysm, without rupture, unspecified: Secondary | ICD-10-CM

## 2015-03-01 NOTE — Telephone Encounter (Signed)
He can hold plavix 5 days before endoscopy. Resume as soon as safe from GI perspective following procedure.

## 2015-03-01 NOTE — Telephone Encounter (Signed)
Please respond to the previous sent letter below.

## 2015-03-01 NOTE — Telephone Encounter (Signed)
Resent anticoagletter to Dr. Burt Knack

## 2015-03-02 NOTE — Telephone Encounter (Signed)
Communicated to patient that, per Dr. Burt Knack, he was to hold his Plavix for 5 days prior to his procedure.  Patient acknowledged and understood

## 2015-03-02 NOTE — Telephone Encounter (Signed)
Lm regarding patient's plavix and procedure

## 2015-03-03 ENCOUNTER — Telehealth: Payer: Self-pay | Admitting: Cardiovascular Disease

## 2015-03-03 NOTE — Telephone Encounter (Signed)
New message    Pt returning call regarding results. Pt states its okay to leave message if not at home.

## 2015-03-03 NOTE — Telephone Encounter (Signed)
Left message on pt's voicemail with results of AAA duplex.

## 2015-04-12 ENCOUNTER — Other Ambulatory Visit: Payer: Self-pay | Admitting: Medical Oncology

## 2015-04-12 DIAGNOSIS — Z85118 Personal history of other malignant neoplasm of bronchus and lung: Secondary | ICD-10-CM

## 2015-04-12 MED ORDER — ERLOTINIB HCL 150 MG PO TABS: 150.0000 mg | ORAL_TABLET | Freq: Every day | ORAL | Status: DC

## 2015-04-12 NOTE — Progress Notes (Signed)
[facility]ill [facility] to [facility]

## 2015-04-14 ENCOUNTER — Telehealth: Payer: Self-pay | Admitting: *Deleted

## 2015-04-14 NOTE — Telephone Encounter (Signed)
[facility] sent facsimile confirmation of Tarceva prescription shipment.  Tarceva was shipped on 04-13-2015 with next business day delivery.

## 2015-04-15 DIAGNOSIS — E118 Type 2 diabetes mellitus with unspecified complications: Secondary | ICD-10-CM | POA: Diagnosis not present

## 2015-04-15 DIAGNOSIS — Z23 Encounter for immunization: Secondary | ICD-10-CM | POA: Diagnosis not present

## 2015-04-15 DIAGNOSIS — I2510 Atherosclerotic heart disease of native coronary artery without angina pectoris: Secondary | ICD-10-CM | POA: Diagnosis not present

## 2015-04-15 DIAGNOSIS — D0220 Carcinoma in situ of unspecified bronchus and lung: Secondary | ICD-10-CM | POA: Diagnosis not present

## 2015-04-20 ENCOUNTER — Encounter: Payer: Self-pay | Admitting: Internal Medicine

## 2015-04-20 ENCOUNTER — Ambulatory Visit (AMBULATORY_SURGERY_CENTER): Payer: Medicare Other | Admitting: Internal Medicine

## 2015-04-20 VITALS — BP 125/69 | HR 79 | Temp 98.4°F | Resp 15 | Ht 72.25 in | Wt 213.0 lb

## 2015-04-20 DIAGNOSIS — Z1211 Encounter for screening for malignant neoplasm of colon: Secondary | ICD-10-CM

## 2015-04-20 DIAGNOSIS — J439 Emphysema, unspecified: Secondary | ICD-10-CM | POA: Diagnosis not present

## 2015-04-20 LAB — GLUCOSE, CAPILLARY
GLUCOSE-CAPILLARY: 121 mg/dL — AB (ref 65–99)
Glucose-Capillary: 121 mg/dL — ABNORMAL HIGH (ref 65–99)

## 2015-04-20 MED ORDER — SODIUM CHLORIDE 0.9 % IV SOLN
500.0000 mL | INTRAVENOUS | Status: DC
Start: 2015-04-20 — End: 2015-04-21

## 2015-04-20 NOTE — Progress Notes (Signed)
To recovery, report to [place], RN, VSS. 

## 2015-04-20 NOTE — Op Note (Signed)
[facility]  [address]. [place], [address]   COLONOSCOPY PROCEDURE REPORT  PATIENT: [name], [name]  MR#: [id number] BIRTHDATE: 01-03-1951 , 34  yrs. old GENDER: male ENDOSCOPIST: Eustace Quail, MD REFERRED RC:[id number] Nyoka Cowden, M.D. PROCEDURE DATE:  04/20/2015 PROCEDURE:   Colonoscopy, screening First Screening Colonoscopy - Avg.  risk and is 50 yrs.  old or older - No.  Prior Negative Screening - Now for repeat screening. 10 or more years since last screening  History of Adenoma - Now for follow-up colonoscopy & has been > or = to 3 yrs.  N/A  Polyps removed today? No Recommend repeat exam, <10 yrs? No ASA CLASS:   Class III INDICATIONS:Screening for colonic neoplasia and Colorectal Neoplasm Risk Assessment for this procedure is average risk. . Negative index exam 2004 MEDICATIONS: Monitored anesthesia care and Propofol 130 mg IV  DESCRIPTION OF PROCEDURE:   After the risks benefits and alternatives of the procedure were thoroughly explained, informed consent was obtained.  The digital rectal exam revealed no abnormalities of the rectum.   The [id number]  endoscope was introduced through the anus and advanced to the cecum, which was identified by both the appendix and ileocecal valve. No adverse events experienced.   The quality of the prep was excellent. (Suprep was used)  The instrument was then slowly withdrawn as the colon was fully examined. Estimated blood loss is zero unless otherwise noted in this procedure report.     COLON FINDINGS: There was moderate diverticulosis noted in the sigmoid colon.   The examination was otherwise normal.  Retroflexed views revealed internal hemorrhoids. The time to cecum = 2.4 Withdrawal time = 8.8   The scope was withdrawn and the procedure completed. COMPLICATIONS: There were no immediate complications.  ENDOSCOPIC IMPRESSION: 1.   Moderate diverticulosis was noted in the sigmoid colon 2.   The  examination was otherwise normal  RECOMMENDATIONS: 1.  Continue current colorectal screening recommendations for "routine risk" patients with a repeat colonoscopy in 10 years. 2.  Resume Plavix today  eSigned:  Eustace Quail, MD 04/20/2015 4:16 PM   cc: The Patient and [name], MD

## 2015-04-20 NOTE — Patient Instructions (Addendum)
YOU HAD AN ENDOSCOPIC PROCEDURE TODAY AT [facility]:   Refer to the procedure report that was given to you for any specific questions about what was found during the examination.  If the procedure report does not answer your questions, please call your gastroenterologist to clarify.  If you requested that your care partner not be given the details of your procedure findings, then the procedure report has been included in a sealed envelope for you to review at your convenience later.  YOU SHOULD EXPECT: Some feelings of bloating in the abdomen. Passage of more gas than usual.  Walking can help get rid of the air that was put into your GI tract during the procedure and reduce the bloating. If you had a lower endoscopy (such as a colonoscopy or flexible sigmoidoscopy) you may notice spotting of blood in your stool or on the toilet paper. If you underwent a bowel prep for your procedure, you may not have a normal bowel movement for a few days.  Please Note:  You might notice some irritation and congestion in your nose or some drainage.  This is from the oxygen used during your procedure.  There is no need for concern and it should clear up in a day or so.  SYMPTOMS TO REPORT IMMEDIATELY:   Following lower endoscopy (colonoscopy or flexible sigmoidoscopy):  Excessive amounts of blood in the stool  Significant tenderness or worsening of abdominal pains  Swelling of the abdomen that is new, acute  Fever of 100F or higher   For urgent or emergent issues, a gastroenterologist can be reached at any hour by calling [phone].   DIET: Your first meal following the procedure should be a small meal and then it is ok to progress to your normal diet. Heavy or fried foods are harder to digest and may make you feel nauseous or bloated.  Likewise, meals heavy in dairy and vegetables can increase bloating.  Drink plenty of fluids but you should avoid alcoholic beverages for 24  hours.  ACTIVITY:  You should plan to take it easy for the rest of today and you should NOT DRIVE or use heavy machinery until tomorrow (because of the sedation medicines used during the test).    FOLLOW UP: Our staff will call the number listed on your records the next business day following your procedure to check on you and address any questions or concerns that you may have regarding the information given to you following your procedure. If we do not reach you, we will leave a message.  However, if you are feeling well and you are not experiencing any problems, there is no need to return our call.  We will assume that you have returned to your regular daily activities without incident.  If any biopsies were taken you will be contacted by phone or by letter within the next 1-3 weeks.  Please call us at [phone] if you have not heard about the biopsies in 3 weeks.    SIGNATURES/CONFIDENTIALITY: You and/or your care partner have signed paperwork which will be entered into your electronic medical record.  These signatures attest to the fact that that the information above on your After Visit Summary has been reviewed and is understood.  Full responsibility of the confidentiality of this discharge information lies with you and/or your care-partner.  Diverticulosis, high fiber diet-handouts given  Resume plavix today 04/20/15.

## 2015-04-21 ENCOUNTER — Telehealth: Payer: Self-pay | Admitting: *Deleted

## 2015-04-21 NOTE — Telephone Encounter (Signed)
  Follow up Call-  Call back number 04/20/2015  Post procedure Call Back phone  # [phone] hm  Permission to leave phone message Yes     Patient questions:  Do you have a fever, pain , or abdominal swelling? No. Pain Score  0 *  Have you tolerated food without any problems? Yes.    Have you been able to return to your normal activities? Yes.    Do you have any questions about your discharge instructions: Diet   No. Medications  No. Follow up visit  No.  Do you have questions or concerns about your Care? No.  Actions: * If pain score is 4 or above: No action needed, pain <4.

## 2015-05-11 DIAGNOSIS — E119 Type 2 diabetes mellitus without complications: Secondary | ICD-10-CM | POA: Diagnosis not present

## 2015-05-11 DIAGNOSIS — H02105 Unspecified ectropion of left lower eyelid: Secondary | ICD-10-CM | POA: Diagnosis not present

## 2015-05-11 DIAGNOSIS — H01001 Unspecified blepharitis right upper eyelid: Secondary | ICD-10-CM | POA: Diagnosis not present

## 2015-05-11 DIAGNOSIS — H2513 Age-related nuclear cataract, bilateral: Secondary | ICD-10-CM | POA: Diagnosis not present

## 2015-05-25 ENCOUNTER — Telehealth: Payer: Self-pay | Admitting: Internal Medicine

## 2015-05-25 ENCOUNTER — Other Ambulatory Visit (HOSPITAL_BASED_OUTPATIENT_CLINIC_OR_DEPARTMENT_OTHER): Payer: Medicare Other

## 2015-05-25 ENCOUNTER — Ambulatory Visit (HOSPITAL_BASED_OUTPATIENT_CLINIC_OR_DEPARTMENT_OTHER): Payer: Medicare Other | Admitting: Internal Medicine

## 2015-05-25 ENCOUNTER — Encounter: Payer: Self-pay | Admitting: Internal Medicine

## 2015-05-25 VITALS — BP 120/69 | HR 90 | Temp 97.7°F | Resp 17 | Ht 72.25 in | Wt 213.4 lb

## 2015-05-25 DIAGNOSIS — C3412 Malignant neoplasm of upper lobe, left bronchus or lung: Secondary | ICD-10-CM

## 2015-05-25 DIAGNOSIS — C3400 Malignant neoplasm of unspecified main bronchus: Secondary | ICD-10-CM

## 2015-05-25 DIAGNOSIS — K219 Gastro-esophageal reflux disease without esophagitis: Secondary | ICD-10-CM | POA: Diagnosis not present

## 2015-05-25 LAB — CBC WITH DIFFERENTIAL/PLATELET
BASO%: 0.8 % (ref 0.0–2.0)
Basophils Absolute: 0.1 10*3/uL (ref 0.0–0.1)
EOS%: 2 % (ref 0.0–7.0)
Eosinophils Absolute: 0.1 10*3/uL (ref 0.0–0.5)
HCT: 38.1 % — ABNORMAL LOW (ref 38.4–49.9)
HGB: 12.7 g/dL — ABNORMAL LOW (ref 13.0–17.1)
LYMPH#: 1.6 10*3/uL (ref 0.9–3.3)
LYMPH%: 21.4 % (ref 14.0–49.0)
MCH: 28.2 pg (ref 27.2–33.4)
MCHC: 33.4 g/dL (ref 32.0–36.0)
MCV: 84.5 fL (ref 79.3–98.0)
MONO#: 0.6 10*3/uL (ref 0.1–0.9)
MONO%: 8.4 % (ref 0.0–14.0)
NEUT#: 5 10*3/uL (ref 1.5–6.5)
NEUT%: 67.4 % (ref 39.0–75.0)
Platelets: 255 10*3/uL (ref 140–400)
RBC: 4.51 10*6/uL (ref 4.20–5.82)
RDW: 16.3 % — AB (ref 11.0–14.6)
WBC: 7.5 10*3/uL (ref 4.0–10.3)

## 2015-05-25 LAB — COMPREHENSIVE METABOLIC PANEL (CC13)
ALBUMIN: 3.6 g/dL (ref 3.5–5.0)
ALK PHOS: 88 U/L (ref 40–150)
ALT: 19 U/L (ref 0–55)
AST: 16 U/L (ref 5–34)
Anion Gap: 8 mEq/L (ref 3–11)
BUN: 15.2 mg/dL (ref 7.0–26.0)
CO2: 22 mEq/L (ref 22–29)
CREATININE: 1.7 mg/dL — AB (ref 0.7–1.3)
Calcium: 9.4 mg/dL (ref 8.4–10.4)
Chloride: 108 mEq/L (ref 98–109)
EGFR: 40 mL/min/{1.73_m2} — ABNORMAL LOW (ref >90)
Glucose: 210 mg/dl — ABNORMAL HIGH (ref 70–140)
POTASSIUM: 5.4 meq/L — AB (ref 3.5–5.1)
Sodium: 139 mEq/L (ref 136–145)
Total Bilirubin: 0.54 mg/dL (ref 0.20–1.20)
Total Protein: 6.9 g/dL (ref 6.4–8.3)

## 2015-05-25 NOTE — Telephone Encounter (Signed)
no vm.....mailed pt appt sched/avs and letter °

## 2015-05-25 NOTE — Progress Notes (Signed)
[facility] [phone]   [phone]  OFFICE PROGRESS NOTE  [facility], [name], [address], [address] [place] [address]  DIAGNOSIS: Metastatic non-small cell lung cancer, adenocarcinoma initially diagnosed in December of 2004  PRIOR THERAPY: 1) status post course of concurrent chemoradiation under the care of Dr. Truddie Coco. 2) status post left upper lobe wedge resection on 01/10/2005 for recurrent disease in the left lung.  CURRENT THERAPY: Tarceva 150 mg by mouth daily started in 2006.  DISEASE STAGE: Stage IV  CHEMOTHERAPY INTENT: Palliative  CURRENT # OF CHEMOTHERAPY CYCLES: 10 years  CURRENT ANTIEMETICS: none  CURRENT SMOKING STATUS: Former smoker, quit January 2004  ORAL CHEMOTHERAPY AND CONSENT: Yes  CURRENT BISPHOSPHONATES USE: None  PAIN MANAGEMENT: None  NARCOTICS INDUCED CONSTIPATION: None  LIVING WILL AND CODE STATUS: Patient states that he requested a DO NOT RESUSCITATE when he was diagnosed in 2004 however now that he is 11 years out from diagnosis he would like to change this to limited code.   INTERVAL HISTORY: [name] 64 y.o. male returns to the clinic today for 6 months followup visit. The patient is feeling fine today with no specific complaints except for occasional gastric spasm. He think it is related to the fish oil that he has been taken by his ophthalmologist. The patient has been on Tarceva 150 mg by mouth daily since 2006. He is tolerating it fairly well with no significant adverse effects except for occasional episodes of diarrhea and mild skin rash. He denied having any significant fever or chills, no nausea or vomiting. The patient denied having any significant chest pain, shortness of breath, cough or hemoptysis. He had repeat bloodwork performed earlier today and he is here for evaluation and discussion of his lab results.  MEDICAL HISTORY: Past Medical History  Diagnosis Date  . CAD  (coronary artery disease)     a. stenting of RCA/LCx 2004. b. [place] 08/2013:  s/p DES to LCx for severe ISR.  Marland Kitchen HTN (hypertension)   . AAA (abdominal aortic aneurysm) (HCC)     a. 3.4 x 3.5 on scan 02/28/13.  . Allergic reaction to contrast dye   . Emphysema lung ([facility])   . Atherosclerosis   . Hepatic steatosis   . Kidney stone   . Diabetes mellitus   . Lung cancer ([facility]) dx'd 2004    chemo/xrt comp; tarceva ongoing  . Diverticulosis   . Hemorrhoids   . Emphysema of lung ([facility])   . Hyperlipidemia     ALLERGIES:  is allergic to iohexol; morphine and related; and other.  MEDICATIONS:  Current Outpatient Prescriptions  Medication Sig Dispense Refill  . aspirin 81 MG chewable tablet Chew 81 mg by mouth daily.    Marland Kitchen atorvastatin (LIPITOR) 40 MG tablet Take 40 mg by mouth daily.    . clopidogrel (PLAVIX) 75 MG tablet TAKE 1 TABLET (75 MG TOTAL) BY MOUTH DAILY. 90 tablet 2  . erlotinib (TARCEVA) 150 MG tablet Take 1 tablet (150 mg total) by mouth daily. Take on an empty stomach 1 hour before meals or 2 hours after. 30 tablet 2  . glimepiride (AMARYL) 4 MG tablet Take 4 mg by mouth 2 (two) times daily.    . isosorbide mononitrate (IMDUR) 30 MG 24 hr tablet TAKE 1 TABLET BY MOUTH ONCE DAILY 90 tablet 1  . Loperamide HCl (IMODIUM A-D) 1 MG/7.5ML LIQD Take by mouth as needed.    Marland Kitchen  losartan (COZAAR) 50 MG tablet TAKE 1 TABLET BY MOUTH EVERY DAY 90 tablet 3  . metFORMIN (GLUCOPHAGE) 850 MG tablet Take 1 tablet (850 mg total) by mouth 2 (two) times daily with a meal.    . metoprolol (LOPRESSOR) 100 MG tablet Take 100 mg by mouth daily. Take one tablet by mouth daily with or following a meal  4  . nitroGLYCERIN (NITROSTAT) 0.4 MG SL tablet Place 1 tablet (0.4 mg total) under the tongue every 5 (five) minutes as needed for chest pain (up to 3 doses). 25 tablet 3  . pantoprazole (PROTONIX) 40 MG tablet Take 1 tablet (40 mg total) by mouth daily. 90 tablet 1   No current facility-administered medications  for this visit.    REVIEW OF SYSTEMS:  A comprehensive review of systems was negative except for: Gastrointestinal: positive for dyspepsia   PHYSICAL EXAMINATION: General appearance: alert, cooperative and no distress Head: Normocephalic, without obvious abnormality, atraumatic Neck: no adenopathy Lymph nodes: Cervical, supraclavicular, and axillary nodes normal. Resp: clear to auscultation bilaterally Cardio: regular rate and rhythm, S1, S2 normal, no murmur, click, rub or gallop GI: soft, non-tender; bowel sounds normal; no masses,  no organomegaly Extremities: extremities normal, atraumatic, no cyanosis or edema Neurologic: Alert and oriented X 3, normal strength and tone. Normal symmetric reflexes. Normal coordination and gait  ECOG PERFORMANCE STATUS: 0 - Asymptomatic  Blood pressure 120/69, pulse 90, temperature 97.7 F (36.5 C), temperature source Oral, resp. rate 17, height 6' 0.25" (1.835 m), weight 213 lb 6.4 oz (96.798 kg), SpO2 98 %.  LABORATORY DATA: Lab Results  Component Value Date   WBC 7.5 05/25/2015   HGB 12.7* 05/25/2015   HCT 38.1* 05/25/2015   MCV 84.5 05/25/2015   PLT 255 05/25/2015      Chemistry      Component Value Date/Time   NA 139 05/25/2015 1010   NA 141 09/09/2013 0715   NA 135 12/06/2010 0819   K 5.4* 05/25/2015 1010   K 5.3 09/09/2013 0715   K 4.4 12/06/2010 0819   CL 101 09/09/2013 0715   CL 106 06/10/2012 0900   CL 97* 12/06/2010 0819   CO2 22 05/25/2015 1010   CO2 26 09/09/2013 0715   CO2 26 12/06/2010 0819   BUN 15.2 05/25/2015 1010   BUN 15 09/09/2013 0715   BUN 17 12/06/2010 0819   CREATININE 1.7* 05/25/2015 1010   CREATININE 1.30 09/09/2013 0715   CREATININE 1.3* 12/06/2010 0819      Component Value Date/Time   CALCIUM 9.4 05/25/2015 1010   CALCIUM 9.4 09/09/2013 0715   CALCIUM 9.3 12/06/2010 0819   ALKPHOS 88 05/25/2015 1010   ALKPHOS 92* 12/06/2010 0819   ALKPHOS 71 06/07/2010 0814   AST 16 05/25/2015 1010   AST 30  12/06/2010 0819   AST 26 06/07/2010 0814   ALT 19 05/25/2015 1010   ALT 55* 12/06/2010 0819   ALT 39 06/07/2010 0814   BILITOT 0.54 05/25/2015 1010   BILITOT 1.10 12/06/2010 0819   BILITOT 1.7* 06/07/2010 0814       RADIOGRAPHIC STUDIES: No results found.  ASSESSMENT/PLAN: This is a very pleasant 64 years old white male with history of metastatic non-small cell lung cancer, adenocarcinoma. He is currently on treatment with Tarceva for the last 11 years and tolerating it fairly well. The patient is doing fine today with no specific complaints.  I recommended for the patient to continue his current treatment with Tarceva 150 mg by mouth  daily. I will see him back for follow-up visit in 6 months for reevaluation after repeating CT scan of the chest without contrast because of his renal insufficiency. The patient was advised to contact his primary care physician for evaluation of his renal insufficiency as well as hyperkalemia. The patient is currently on Protonix for GERD and I recommended for him to discontinue this treatment because of the interaction with Tarceva and to replace it with Pepcid which would be taken every 12 hour but at least 2 hours after Tarceva and 10 hours before the next dose. He was advised to call immediately if he has any concerning symptoms in the interval.  All questions were answered. The patient knows to call the clinic with any problems, questions or concerns. We can certainly see the patient much sooner if necessary.  Disclaimer: This note was dictated with voice recognition software. Similar sounding words can inadvertently be transcribed and may not be corrected upon review.

## 2015-06-08 ENCOUNTER — Encounter: Payer: Self-pay | Admitting: Internal Medicine

## 2015-06-08 NOTE — Progress Notes (Signed)
Per [facility] tarceva was shipped via fedex

## 2015-06-09 ENCOUNTER — Encounter: Payer: Self-pay | Admitting: Internal Medicine

## 2015-06-09 NOTE — Progress Notes (Signed)
Per [employer] tarceva shipped via fedex

## 2015-07-09 ENCOUNTER — Other Ambulatory Visit: Payer: Self-pay | Admitting: Medical Oncology

## 2015-07-09 DIAGNOSIS — Z85118 Personal history of other malignant neoplasm of bronchus and lung: Secondary | ICD-10-CM

## 2015-07-09 MED ORDER — ERLOTINIB HCL 150 MG PO TABS: 150.0000 mg | ORAL_TABLET | Freq: Every day | ORAL | Status: DC

## 2015-07-16 ENCOUNTER — Other Ambulatory Visit: Payer: Self-pay | Admitting: Medical Oncology

## 2015-07-16 DIAGNOSIS — Z85118 Personal history of other malignant neoplasm of bronchus and lung: Secondary | ICD-10-CM

## 2015-07-16 MED ORDER — ERLOTINIB HCL 150 MG PO TABS: 150.0000 mg | ORAL_TABLET | Freq: Every day | ORAL | Status: DC

## 2015-07-22 DIAGNOSIS — E118 Type 2 diabetes mellitus with unspecified complications: Secondary | ICD-10-CM | POA: Diagnosis not present

## 2015-07-22 DIAGNOSIS — N200 Calculus of kidney: Secondary | ICD-10-CM | POA: Diagnosis not present

## 2015-07-22 DIAGNOSIS — D0220 Carcinoma in situ of unspecified bronchus and lung: Secondary | ICD-10-CM | POA: Diagnosis not present

## 2015-07-22 DIAGNOSIS — I2510 Atherosclerotic heart disease of native coronary artery without angina pectoris: Secondary | ICD-10-CM | POA: Diagnosis not present

## 2015-09-02 DIAGNOSIS — E118 Type 2 diabetes mellitus with unspecified complications: Secondary | ICD-10-CM | POA: Diagnosis not present

## 2015-09-13 ENCOUNTER — Encounter: Payer: Self-pay | Admitting: Internal Medicine

## 2015-09-13 NOTE — Progress Notes (Signed)
Per [employer] 09/10/15 tarceva will be shipped via fedex

## 2015-10-05 DIAGNOSIS — H02105 Unspecified ectropion of left lower eyelid: Secondary | ICD-10-CM | POA: Diagnosis not present

## 2015-10-06 ENCOUNTER — Other Ambulatory Visit: Payer: Self-pay | Admitting: Medical Oncology

## 2015-10-06 DIAGNOSIS — Z85118 Personal history of other malignant neoplasm of bronchus and lung: Secondary | ICD-10-CM

## 2015-10-06 MED ORDER — ERLOTINIB HCL 150 MG PO TABS: 150.0000 mg | ORAL_TABLET | Freq: Every day | ORAL | Status: DC

## 2015-10-15 DIAGNOSIS — Z125 Encounter for screening for malignant neoplasm of prostate: Secondary | ICD-10-CM | POA: Diagnosis not present

## 2015-10-15 DIAGNOSIS — E1165 Type 2 diabetes mellitus with hyperglycemia: Secondary | ICD-10-CM | POA: Diagnosis not present

## 2015-10-15 DIAGNOSIS — E108 Type 1 diabetes mellitus with unspecified complications: Secondary | ICD-10-CM | POA: Diagnosis not present

## 2015-10-15 DIAGNOSIS — L03039 Cellulitis of unspecified toe: Secondary | ICD-10-CM | POA: Diagnosis not present

## 2015-10-15 DIAGNOSIS — I719 Aortic aneurysm of unspecified site, without rupture: Secondary | ICD-10-CM | POA: Diagnosis not present

## 2015-10-15 DIAGNOSIS — D559 Anemia due to enzyme disorder, unspecified: Secondary | ICD-10-CM | POA: Diagnosis not present

## 2015-10-15 DIAGNOSIS — Z0000 Encounter for general adult medical examination without abnormal findings: Secondary | ICD-10-CM | POA: Diagnosis not present

## 2015-10-15 DIAGNOSIS — E785 Hyperlipidemia, unspecified: Secondary | ICD-10-CM | POA: Diagnosis not present

## 2015-10-22 DIAGNOSIS — L03039 Cellulitis of unspecified toe: Secondary | ICD-10-CM | POA: Diagnosis not present

## 2015-10-22 DIAGNOSIS — H532 Diplopia: Secondary | ICD-10-CM | POA: Diagnosis not present

## 2015-11-08 ENCOUNTER — Other Ambulatory Visit: Payer: Self-pay | Admitting: Medical Oncology

## 2015-11-08 DIAGNOSIS — Z85118 Personal history of other malignant neoplasm of bronchus and lung: Secondary | ICD-10-CM

## 2015-11-08 MED ORDER — ERLOTINIB HCL 150 MG PO TABS: 150.0000 mg | ORAL_TABLET | Freq: Every day | ORAL | Status: DC

## 2015-11-12 ENCOUNTER — Ambulatory Visit (INDEPENDENT_AMBULATORY_CARE_PROVIDER_SITE_OTHER): Payer: Medicare Other | Admitting: Cardiovascular Disease

## 2015-11-12 ENCOUNTER — Encounter: Payer: Self-pay | Admitting: Cardiovascular Disease

## 2015-11-12 VITALS — BP 130/72 | HR 80 | Ht 73.0 in | Wt 213.2 lb

## 2015-11-12 DIAGNOSIS — I714 Abdominal aortic aneurysm, without rupture, unspecified: Secondary | ICD-10-CM

## 2015-11-12 DIAGNOSIS — E785 Hyperlipidemia, unspecified: Secondary | ICD-10-CM | POA: Diagnosis not present

## 2015-11-12 DIAGNOSIS — I2510 Atherosclerotic heart disease of native coronary artery without angina pectoris: Secondary | ICD-10-CM | POA: Diagnosis not present

## 2015-11-12 DIAGNOSIS — I10 Essential (primary) hypertension: Secondary | ICD-10-CM

## 2015-11-12 NOTE — Progress Notes (Signed)
Cardiology Office Note Date:  11/13/2015   ID:  [name], DOB Dec 22, 1950, MRN [id number]  PCP:  [name] Peaches, MD  Cardiologist:  Sherren Mocha, MD    Chief Complaint  Patient presents with  . Coronary Artery Disease    denies any chest pain, sob, le edema, or claudication  . Hypertension     History of Present Illness: [name] is a 65 y.o. male who presents for follow-up of CAD. He has had PCI of the RCA and LCx in 2004 and did well until 2015 when he presented with progressive angina. He was found to have severe ISR in the circumflex and underwent PCI with a drug-eluting stent.  The patient is doing well. He denies chest pain, shortness of breath,, or heart palpitations. He's gained some weight over the winter and states that he is motivated to lose weight with increased exercise. He denies any significant illnesses, hospitalizations, or new symptoms since his last evaluation here.  Past Medical History  Diagnosis Date  . CAD (coronary artery disease)     a. stenting of RCA/LCx 2004. b. [place] 08/2013:  s/p DES to LCx for severe ISR.  Marland Kitchen HTN (hypertension)   . AAA (abdominal aortic aneurysm) ([facility])     a. 3.4 x 3.5 on scan 02/28/13.  . Allergic reaction to contrast dye   . Emphysema lung ([facility])   . Atherosclerosis   . Hepatic steatosis   . Kidney stone   . Diabetes mellitus   . Lung cancer ([facility]) dx'd 2004    chemo/xrt comp; tarceva ongoing  . Diverticulosis   . Hemorrhoids   . Emphysema of lung ([facility])   . Hyperlipidemia     Past Surgical History  Procedure Laterality Date  . Angioplasty / stenting femoral  2015    2004-groin  . Left heart catheterization with coronary angiogram N/A 09/08/2013    Procedure: LEFT HEART CATHETERIZATION WITH CORONARY ANGIOGRAM;  Surgeon: Blane Ohara, MD;  Location: [facility] CATH LAB;  Service: Cardiovascular;  Laterality: N/A;  . Wisdom tooth extraction    . Left partial lobectomy      Current Outpatient Prescriptions    Medication Sig Dispense Refill  . aspirin 81 MG chewable tablet Chew 81 mg by mouth daily.    Marland Kitchen atorvastatin (LIPITOR) 40 MG tablet Take 40 mg by mouth daily.    . clopidogrel (PLAVIX) 75 MG tablet TAKE 1 TABLET (75 MG TOTAL) BY MOUTH DAILY. 90 tablet 2  . erlotinib (TARCEVA) 150 MG tablet Take 1 tablet (150 mg total) by mouth daily. Take on an empty stomach 1 hour before meals or 2 hours after. 30 tablet 0  . famotidine (PEPCID) 20 MG tablet Take 20 mg by mouth daily.    Marland Kitchen glimepiride (AMARYL) 4 MG tablet Take 4 mg by mouth 2 (two) times daily.    . isosorbide mononitrate (IMDUR) 30 MG 24 hr tablet TAKE 1 TABLET BY MOUTH ONCE DAILY 90 tablet 1  . Loperamide HCl (IMODIUM A-D) 1 MG/7.5ML LIQD Take 7.5 mLs by mouth as needed (diarrhea).     . losartan (COZAAR) 50 MG tablet TAKE 1 TABLET BY MOUTH EVERY DAY 90 tablet 3  . metFORMIN (GLUCOPHAGE) 850 MG tablet Take 1 tablet (850 mg total) by mouth 2 (two) times daily with a meal.    . metoprolol (LOPRESSOR) 100 MG tablet Take 100 mg by mouth daily. Take one tablet by mouth daily with or following a meal  4  . nitroGLYCERIN (NITROSTAT) 0.4 MG SL tablet Place 1 tablet (0.4 mg total) under the tongue every 5 (five) minutes as needed for chest pain (up to 3 doses). 25 tablet 3  . triazolam (HALCION) 0.25 MG tablet Take 1 tablet by mouth at bedtime.  5   No current facility-administered medications for this visit.    Allergies:   Iohexol; Morphine and related; and Other   Social History:  The patient  reports that he quit smoking about 13 years ago. He has never used smokeless tobacco. He reports that he does not drink alcohol or use illicit drugs.   Family History:  The patient's  family history includes Diabetes in his sister; Heart disease in his sister; Ovarian cancer in his sister. There is no history of Colon cancer, Esophageal cancer, Rectal cancer, or Stomach cancer.    ROS:  Please see the history of present illness.  All other systems are  reviewed and negative.    PHYSICAL EXAM: VS:  BP 130/72 mmHg  Pulse 80  Ht 6' 1"  (1.854 m)  Wt 213 lb 3.2 oz (96.707 kg)  BMI 28.13 kg/m2 , BMI Body mass index is 28.13 kg/(m^2). GEN: Well nourished, well developed, in no acute distress HEENT: normal Neck: no JVD, no masses. No carotid bruits Cardiac: RRR without murmur or gallop                Respiratory:  clear to auscultation bilaterally, normal work of breathing GI: soft, nontender, nondistended, + BS MS: no deformity or atrophy Ext: no pretibial edema, pedal pulses 2+= bilaterally Skin: warm and dry, no rash Neuro:  Strength and sensation are intact Psych: euthymic mood, full affect  EKG:  EKG is ordered today. The ekg ordered today shows normal sinus rhythm 82 bpm, within normal limits.  Recent Labs: 05/25/2015: ALT 19; BUN 15.2; Creatinine 1.7*; HGB 12.7*; Platelets 255; Potassium 5.4*; Sodium 139   Lipid Panel  No results found for: CHOL, TRIG, HDL, CHOLHDL, VLDL, LDLCALC, LDLDIRECT    Wt Readings from Last 3 Encounters:  11/12/15 213 lb 3.2 oz (96.707 kg)  05/25/15 213 lb 6.4 oz (96.798 kg)  04/20/15 213 lb (96.616 kg)     Cardiac Studies Reviewed: Cardiac catheterization 2015: Procedure: Left Heart Cath, Selective Coronary Angiography, LV angiography, PTCA and stenting of the LCx  Indication: CCS Class 3 angina, hx PCI with stenting of the RCA and LCx in 2004. Exertional angina progressive over several months. Sx's occur with low level activity, especially in cold weather.  Procedural Details: The right wrist was prepped, draped, and anesthetized with 1% lidocaine. Using the modified Seldinger technique, a 5/6 French sheath was introduced into the right radial artery. 3 mg of verapamil was administered through the sheath, weight-based unfractionated heparin was administered intravenously. Standard Judkins catheters were used for selective coronary angiography and left ventriculography. Catheter exchanges were  performed over an exchange length guidewire.  PROCEDURAL FINDINGS Hemodynamics: AO 125/63 LV 127/12  Coronary angiography: Coronary dominance: right  Left mainstem: Arises from the left cusp. Patent without significant disease.  Left anterior descending (LAD): The LAD has mild irregularity. The vessel is essentially patent throughout. I do not appreciate any significant stenoses. The diagonal branches are patent. The vessel reaches the LV apex.  Left circumflex (LCx): The left circumflex has a long stented segment in its midportion. There is diffuse 50% in-stent restenosis leading into a severe 95% stenosis at the distal stent edge. The stent extends into the second  OM branch which has no significant disease after the tight 95% stenosis the  Right coronary artery (RCA): The RCA is a large, dominant vessel. The proximal stent is patent without restenosis. The mid vessel has a napkin ring 40-50% stenosis. The distal vessel is patent. The PDA and PLA branches are patent without stenosis..  Left ventriculography: Left ventricular systolic function is normal, LVEF is estimated at 55-65%, there is no significant mitral regurgitation   PCI Note: Following the diagnostic procedure, the decision was made to proceed with PCI of the left circumflex. Additional heparin was given for anticoagulation. Once a therapeutic ACT was achieved, a 6 [place] XB guide catheter was inserted. A cougar coronary guidewire was used to cross the lesion. The lesion was predilated with a 2.5 x 12 mm balloon. The lesion was then stented with a 2.5 x 24 mm Promus Premier drug-eluting stent. The stent was postdilated with a 2.75 mm noncompliant balloon to 18 atm. Following PCI, there was 0% residual stenosis and TIMI-3 flow. The stent extended from the mid-portion of the previously implanted stent beyond the distal edge of the old stent. Final angiography confirmed an excellent result. The patient tolerated the  procedure well. There were no immediate procedural complications. A TR band was used for radial hemostasis. The patient was transferred to the post catheterization recovery area for further monitoring.  PCI Data: Vessel - LCx/Segment - OM2 Percent Stenosis (pre) 95 TIMI-flow 3 Stent 2.5x24 mm Promus DES Percent Stenosis (post) 0 TIMI-flow (post) 3  Final Conclusions:  1. 2 vessel CAD with patency of the previously implanted RCA stent and severe ISR of the LCx stent.  2. Normal LV function 3. Successful PCI of the LCx with a single DES 4. Patency of the LAD without significant stenosis   Recommendations:  DAPT with ASA and plavix for at least 12 months.  ASSESSMENT AND PLAN: 1.  CAD, native vessel, without angina. The patient is doing well on his current program. He's been maintained on long-term dual antiplatelet therapy with aspirin". He continues on a statin drug with atorvastatin 40 mg daily.  2. Essential hypertension: Blood pressure is well controlled on losartan and metoprolol  3. Hyperlipidemia: treated with atorvastatin 40 mg. Lipids followed by PCP.   4. Abdominal aortic aneurysm: Most recent duplex scan reviewed from last year with AAA measurements 3.1 x 3.5 cm. His annual duplex scan in July.  Current medicines are reviewed with the patient today.  The patient does not have concerns regarding medicines.  Labs/ tests ordered today include:   Orders Placed This Encounter  Procedures  . EKG 12-Lead    Disposition:   FU one year  Signed, Sherren Mocha, MD  11/13/2015 7:31 AM    [facility] [place], [place], [place]  [address] Phone: [phone]; Fax: [phone]

## 2015-11-12 NOTE — Patient Instructions (Signed)
Medication Instructions:  Your physician recommends that you continue on your current medications as directed. Please refer to the Current Medication list given to you today.  Labwork: No new orders.   Testing/Procedures: Your physician has requested that you have an abdominal aorta duplex in [facility]. During this test, an ultrasound is used to evaluate the aorta. Allow 30 minutes for this exam. Do not eat after midnight the day before and avoid carbonated beverages  Follow-Up: Your physician wants you to follow-up in: 1 YEAR with Dr Burt Knack.  You will receive a reminder letter in the mail two months in advance. If you don't receive a letter, please call our office to schedule the follow-up appointment.   Any Other Special Instructions Will Be Listed Below (If Applicable).     If you need a refill on your cardiac medications before your next appointment, please call your pharmacy.

## 2015-11-22 ENCOUNTER — Ambulatory Visit (HOSPITAL_COMMUNITY): Payer: Medicare Other

## 2015-11-23 ENCOUNTER — Encounter (HOSPITAL_COMMUNITY): Payer: Self-pay

## 2015-11-23 ENCOUNTER — Ambulatory Visit (HOSPITAL_COMMUNITY)
Admission: RE | Admit: 2015-11-23 | Discharge: 2015-11-23 | Disposition: A | Discharge status: Home / Self Care | Payer: Medicare Other | Source: Ambulatory Visit | Attending: Internal Medicine | Admitting: Internal Medicine

## 2015-11-23 ENCOUNTER — Other Ambulatory Visit (HOSPITAL_BASED_OUTPATIENT_CLINIC_OR_DEPARTMENT_OTHER): Payer: Medicare Other

## 2015-11-23 DIAGNOSIS — C3412 Malignant neoplasm of upper lobe, left bronchus or lung: Secondary | ICD-10-CM

## 2015-11-23 DIAGNOSIS — C3400 Malignant neoplasm of unspecified main bronchus: Secondary | ICD-10-CM | POA: Insufficient documentation

## 2015-11-23 DIAGNOSIS — Z8781 Personal history of (healed) traumatic fracture: Secondary | ICD-10-CM | POA: Insufficient documentation

## 2015-11-23 DIAGNOSIS — I700 Atherosclerosis of aorta: Secondary | ICD-10-CM | POA: Insufficient documentation

## 2015-11-23 DIAGNOSIS — I2510 Atherosclerotic heart disease of native coronary artery without angina pectoris: Secondary | ICD-10-CM | POA: Insufficient documentation

## 2015-11-23 DIAGNOSIS — R911 Solitary pulmonary nodule: Secondary | ICD-10-CM | POA: Diagnosis not present

## 2015-11-23 DIAGNOSIS — J439 Emphysema, unspecified: Secondary | ICD-10-CM | POA: Diagnosis not present

## 2015-11-23 LAB — CBC WITH DIFFERENTIAL/PLATELET
BASO%: 0.5 % (ref 0.0–2.0)
Basophils Absolute: 0 10*3/uL (ref 0.0–0.1)
EOS ABS: 0.2 10*3/uL (ref 0.0–0.5)
EOS%: 2.5 % (ref 0.0–7.0)
HEMATOCRIT: 39.8 % (ref 38.4–49.9)
HEMOGLOBIN: 13.2 g/dL (ref 13.0–17.1)
LYMPH%: 24.8 % (ref 14.0–49.0)
MCH: 28 pg (ref 27.2–33.4)
MCHC: 33.2 g/dL (ref 32.0–36.0)
MCV: 84.2 fL (ref 79.3–98.0)
MONO#: 0.7 10*3/uL (ref 0.1–0.9)
MONO%: 7.9 % (ref 0.0–14.0)
NEUT#: 5.4 10*3/uL (ref 1.5–6.5)
NEUT%: 64.3 % (ref 39.0–75.0)
PLATELETS: 246 10*3/uL (ref 140–400)
RBC: 4.72 10*6/uL (ref 4.20–5.82)
RDW: 17.1 % — AB (ref 11.0–14.6)
WBC: 8.3 10*3/uL (ref 4.0–10.3)
lymph#: 2.1 10*3/uL (ref 0.9–3.3)

## 2015-11-23 LAB — COMPREHENSIVE METABOLIC PANEL
ALBUMIN: 3.7 g/dL (ref 3.5–5.0)
ALK PHOS: 73 U/L (ref 40–150)
ALT: 21 U/L (ref 0–55)
AST: 20 U/L (ref 5–34)
Anion Gap: 8 mEq/L (ref 3–11)
BILIRUBIN TOTAL: 1.06 mg/dL (ref 0.20–1.20)
BUN: 13.9 mg/dL (ref 7.0–26.0)
CO2: 22 mEq/L (ref 22–29)
Calcium: 9.2 mg/dL (ref 8.4–10.4)
Chloride: 108 mEq/L (ref 98–109)
Creatinine: 1.6 mg/dL — ABNORMAL HIGH (ref 0.7–1.3)
EGFR: 45 mL/min/{1.73_m2} — AB (ref >90)
Glucose: 137 mg/dl (ref 70–140)
Potassium: 4.5 mEq/L (ref 3.5–5.1)
Sodium: 138 mEq/L (ref 136–145)
Total Protein: 7.1 g/dL (ref 6.4–8.3)

## 2015-11-30 ENCOUNTER — Telehealth: Payer: Self-pay | Admitting: Internal Medicine

## 2015-11-30 ENCOUNTER — Encounter: Payer: Self-pay | Admitting: Internal Medicine

## 2015-11-30 ENCOUNTER — Ambulatory Visit (HOSPITAL_BASED_OUTPATIENT_CLINIC_OR_DEPARTMENT_OTHER): Payer: Medicare Other | Admitting: Internal Medicine

## 2015-11-30 VITALS — BP 105/61 | HR 99 | Temp 97.8°F | Resp 20 | Ht 73.0 in | Wt 213.4 lb

## 2015-11-30 DIAGNOSIS — C3412 Malignant neoplasm of upper lobe, left bronchus or lung: Secondary | ICD-10-CM | POA: Diagnosis not present

## 2015-11-30 NOTE — Telephone Encounter (Signed)
Gave and printed appt sched and avs for pt for oCT

## 2015-11-30 NOTE — Progress Notes (Signed)
[place] Telephone:([phone]) [phone]   Fax:([phone]) [phone]  OFFICE PROGRESS NOTE  [name], [address], [address] [place] [address]  DIAGNOSIS: Metastatic non-small cell lung cancer, adenocarcinoma initially diagnosed in December of 2004  PRIOR THERAPY: 1) status post course of concurrent chemoradiation under the care of Dr. Truddie Coco. 2) status post left upper lobe wedge resection on 01/10/2005 for recurrent disease in the left lung.  CURRENT THERAPY: Tarceva 150 mg by mouth daily started in 2006.  DISEASE STAGE: Stage IV  CHEMOTHERAPY INTENT: Palliative  CURRENT # OF CHEMOTHERAPY CYCLES: 11 years  CURRENT ANTIEMETICS: None  CURRENT SMOKING STATUS: Former smoker, quit January 2004  ORAL CHEMOTHERAPY AND CONSENT: Yes  CURRENT BISPHOSPHONATES USE: None  PAIN MANAGEMENT: None  NARCOTICS INDUCED CONSTIPATION: None  LIVING WILL AND CODE STATUS: Patient states that he requested a DO NOT RESUSCITATE when he was diagnosed in 2004 however now that he is 11 years out from diagnosis he would like to change this to limited code.  INTERVAL HISTORY: [name] 65 y.o. male returns to the clinic today for annual followup visit. The patient is feeling fine today with no specific complaints. The patient has been on Tarceva 150 mg by mouth daily since 2006. He is tolerating it fairly well with no significant adverse effects except for occasional episodes of diarrhea and mild skin rash. He denied having any significant fever or chills, no nausea or vomiting. The patient denied having any significant chest pain, shortness of breath, cough or hemoptysis. He had repeat CT scan of the chest performed recently and he is here for evaluation and discussion of his scan results.  MEDICAL HISTORY: Past Medical History  Diagnosis Date  . CAD (coronary artery disease)     a. stenting of RCA/LCx 2004. b. [place] 08/2013:  s/p DES to LCx for severe ISR.  Marland Kitchen HTN  (hypertension)   . AAA (abdominal aortic aneurysm) (HCC)     a. 3.4 x 3.5 on scan 02/28/13.  . Allergic reaction to contrast dye   . Emphysema lung ([place])   . Atherosclerosis   . Hepatic steatosis   . Kidney stone   . Diabetes mellitus   . Diverticulosis   . Hemorrhoids   . Emphysema of lung ([place])   . Hyperlipidemia   . Lung cancer ([place]) dx'd 2004    chemo/xrt comp; tarceva ongoing    ALLERGIES:  is allergic to iohexol; morphine and related; and other.  MEDICATIONS:  Current Outpatient Prescriptions  Medication Sig Dispense Refill  . aspirin 81 MG chewable tablet Chew 81 mg by mouth daily.    Marland Kitchen atorvastatin (LIPITOR) 40 MG tablet Take 40 mg by mouth daily.    . clopidogrel (PLAVIX) 75 MG tablet TAKE 1 TABLET (75 MG TOTAL) BY MOUTH DAILY. 90 tablet 2  . erlotinib (TARCEVA) 150 MG tablet Take 1 tablet (150 mg total) by mouth daily. Take on an empty stomach 1 hour before meals or 2 hours after. 30 tablet 0  . famotidine (PEPCID) 20 MG tablet Take 20 mg by mouth daily.    Marland Kitchen glimepiride (AMARYL) 4 MG tablet Take 4 mg by mouth 2 (two) times daily.    . isosorbide mononitrate (IMDUR) 30 MG 24 hr tablet TAKE 1 TABLET BY MOUTH ONCE DAILY 90 tablet 1  . Loperamide HCl (IMODIUM A-D) 1 MG/7.5ML LIQD Take 7.5 mLs by mouth as needed (diarrhea).     . losartan (COZAAR)  50 MG tablet TAKE 1 TABLET BY MOUTH EVERY DAY 90 tablet 3  . metFORMIN (GLUCOPHAGE) 850 MG tablet Take 1 tablet (850 mg total) by mouth 2 (two) times daily with a meal.    . metoprolol (LOPRESSOR) 100 MG tablet Take 100 mg by mouth daily. Take one tablet by mouth daily with or following a meal  4  . nitroGLYCERIN (NITROSTAT) 0.4 MG SL tablet Place 1 tablet (0.4 mg total) under the tongue every 5 (five) minutes as needed for chest pain (up to 3 doses). 25 tablet 3  . triazolam (HALCION) 0.25 MG tablet Take 1 tablet by mouth at bedtime.  5   No current facility-administered medications for this visit.    REVIEW OF SYSTEMS:  A  comprehensive review of systems was negative.   PHYSICAL EXAMINATION: General appearance: alert, cooperative and no distress Head: Normocephalic, without obvious abnormality, atraumatic Neck: no adenopathy Lymph nodes: Cervical, supraclavicular, and axillary nodes normal. Resp: clear to auscultation bilaterally Cardio: regular rate and rhythm, S1, S2 normal, no murmur, click, rub or gallop GI: soft, non-tender; bowel sounds normal; no masses,  no organomegaly Extremities: extremities normal, atraumatic, no cyanosis or edema Neurologic: Alert and oriented X 3, normal strength and tone. Normal symmetric reflexes. Normal coordination and gait  ECOG PERFORMANCE STATUS: 0 - Asymptomatic  There were no vitals taken for this visit.  LABORATORY DATA: Lab Results  Component Value Date   WBC 8.3 11/23/2015   HGB 13.2 11/23/2015   HCT 39.8 11/23/2015   MCV 84.2 11/23/2015   PLT 246 11/23/2015      Chemistry      Component Value Date/Time   NA 138 11/23/2015 0952   NA 141 09/09/2013 0715   NA 135 12/06/2010 0819   K 4.5 11/23/2015 0952   K 5.3 09/09/2013 0715   K 4.4 12/06/2010 0819   CL 101 09/09/2013 0715   CL 106 06/10/2012 0900   CL 97* 12/06/2010 0819   CO2 22 11/23/2015 0952   CO2 26 09/09/2013 0715   CO2 26 12/06/2010 0819   BUN 13.9 11/23/2015 0952   BUN 15 09/09/2013 0715   BUN 17 12/06/2010 0819   CREATININE 1.6* 11/23/2015 0952   CREATININE 1.30 09/09/2013 0715   CREATININE 1.3* 12/06/2010 0819      Component Value Date/Time   CALCIUM 9.2 11/23/2015 0952   CALCIUM 9.4 09/09/2013 0715   CALCIUM 9.3 12/06/2010 0819   ALKPHOS 73 11/23/2015 0952   ALKPHOS 92* 12/06/2010 0819   ALKPHOS 71 06/07/2010 0814   AST 20 11/23/2015 0952   AST 30 12/06/2010 0819   AST 26 06/07/2010 0814   ALT 21 11/23/2015 0952   ALT 55* 12/06/2010 0819   ALT 39 06/07/2010 0814   BILITOT 1.06 11/23/2015 0952   BILITOT 1.10 12/06/2010 0819   BILITOT 1.7* 06/07/2010 0814        RADIOGRAPHIC STUDIES: Ct Chest Wo Contrast  11/23/2015  CLINICAL DATA:  65 year old male with history of lung cancer is status post surgical resection, chemotherapy and radiation therapy now complete. Tarceva therapy ongoing. No current chest complaints. EXAM: CT CHEST WITHOUT CONTRAST TECHNIQUE: Multidetector CT imaging of the chest was performed following the standard protocol without IV contrast. COMPARISON:  Chest CT 11/17/2014. FINDINGS: Mediastinum/Lymph Nodes: Heart size is normal. There is no significant pericardial fluid, thickening or pericardial calcification. There is atherosclerosis of the thoracic aorta, the great vessels of the mediastinum and the coronary arteries, including calcified atherosclerotic plaque in the left  main, left anterior descending, left circumflex and right coronary arteries. Calcifications of the aortic valve. No pathologically enlarged mediastinal or hilar lymph nodes. Please note that accurate exclusion of hilar adenopathy is limited on noncontrast CT scans. Esophagus is unremarkable in appearance. No axillary lymphadenopathy. Lungs/Pleura: Postoperative changes of wedge resection are noted in the posterior aspect of the left upper lobe where there continues to be some mild postoperative scarring that is predominantly linear but also slightly nodular in appearance. The nodular portion of this measures up to 9 mm in thickness (previously 10 mm). Previously noted right lower lobe pulmonary nodule appears slightly more prominent, measuring 7 x 6 mm on today's examination (image 65 of series 5). No other suspicious appearing pulmonary nodules or masses are noted. There is no acute consolidative airspace disease. No pleural effusions. Mild diffuse bronchial wall thickening with moderate centrilobular and paraseptal emphysema. Upper Abdomen: Nonobstructive calculi in the upper pole collecting system of the left kidney measuring up to 3 mm. Atherosclerosis.  Musculoskeletal/Soft Tissues: Old healed right-sided rib fractures and postthoracotomy changes in the left hemithorax are again noted. There are no aggressive appearing lytic or blastic lesions noted in the visualized portions of the skeleton. IMPRESSION: 1. Today's study demonstrates generally stable post treatment related changes in the thorax, as discussed above. No definitive evidence to suggest local recurrence of disease. 2. Slight interval enlargement of a 6 x 7 mm nodule in the right lower lobe. This remains nonspecific, but attention on followup studies is recommended to ensure the stability or regression of this finding. 3. Mild diffuse bronchial wall thickening with moderate centrilobular and paraseptal emphysema; imaging findings suggestive of underlying COPD. 4. Atherosclerosis, including left main and 3 vessel coronary artery disease. Please note that although the presence of coronary artery calcium documents the presence of coronary artery disease, the severity of this disease and any potential stenosis cannot be assessed on this non-gated CT examination. Assessment for potential risk factor modification, dietary therapy or pharmacologic therapy may be warranted, if clinically indicated. 5. There are calcifications of the aortic valve. Echocardiographic correlation for evaluation of potential valvular dysfunction may be warranted if clinically indicated. Electronically Signed   By: Vinnie Langton M.D.   On: 11/23/2015 13:56    ASSESSMENT/PLAN: This is a very pleasant 65 years old white male with history of metastatic non-small cell lung cancer, adenocarcinoma. He is currently on treatment with Tarceva for the last 11 years and tolerating it fairly well. The patient is doing fine today with no specific complaints.  I recommended for the patient to continue his current treatment with Tarceva 150 mg by mouth daily. The recent CT scan of the chest showed slight increase in the right lower lobe  pulmonary nodule. I discussed the scan results and showed the images to the patient today. I recommended for him to have repeat CT scan of the chest in 6 months for reevaluation of this nodule. He will come back for follow-up visit at that time. For the insufficiency, the patient will continue his evaluation and management by his primary care physician. He was advised to call immediately if he has any concerning symptoms in the interval.  All questions were answered. The patient knows to call the clinic with any problems, questions or concerns. We can certainly see the patient much sooner if necessary.  Disclaimer: This note was dictated with voice recognition software. Similar sounding words can inadvertently be transcribed and may not be corrected upon review.

## 2015-12-10 ENCOUNTER — Other Ambulatory Visit: Payer: Self-pay | Admitting: Medical Oncology

## 2015-12-10 DIAGNOSIS — Z85118 Personal history of other malignant neoplasm of bronchus and lung: Secondary | ICD-10-CM

## 2015-12-10 MED ORDER — ERLOTINIB HCL 150 MG PO TABS: 150.0000 mg | ORAL_TABLET | Freq: Every day | ORAL | Status: DC

## 2015-12-17 ENCOUNTER — Encounter: Payer: Self-pay | Admitting: Internal Medicine

## 2015-12-17 NOTE — Progress Notes (Signed)
Per [employer] tarceva was shipped via [employer] 12/16/15

## 2016-01-12 ENCOUNTER — Encounter: Payer: Self-pay | Admitting: Internal Medicine

## 2016-01-12 NOTE — Progress Notes (Signed)
Per [employer] tarceva shipped via [employer] 01/11/16

## 2016-01-24 ENCOUNTER — Ambulatory Visit
Admission: RE | Admit: 2016-01-24 | Discharge: 2016-01-24 | Disposition: A | Discharge status: Home / Self Care | Payer: Medicare Other | Source: Ambulatory Visit | Attending: Internal Medicine | Admitting: Internal Medicine

## 2016-01-24 ENCOUNTER — Other Ambulatory Visit: Payer: Self-pay | Admitting: Internal Medicine

## 2016-01-24 DIAGNOSIS — R059 Cough, unspecified: Secondary | ICD-10-CM

## 2016-01-24 DIAGNOSIS — C4650 Kaposi's sarcoma of unspecified lung: Secondary | ICD-10-CM | POA: Diagnosis not present

## 2016-01-24 DIAGNOSIS — R062 Wheezing: Secondary | ICD-10-CM

## 2016-01-24 DIAGNOSIS — R05 Cough: Secondary | ICD-10-CM

## 2016-01-24 DIAGNOSIS — Z85118 Personal history of other malignant neoplasm of bronchus and lung: Secondary | ICD-10-CM

## 2016-01-26 DIAGNOSIS — J449 Chronic obstructive pulmonary disease, unspecified: Secondary | ICD-10-CM | POA: Diagnosis not present

## 2016-02-11 DIAGNOSIS — J209 Acute bronchitis, unspecified: Secondary | ICD-10-CM | POA: Diagnosis not present

## 2016-02-21 ENCOUNTER — Ambulatory Visit (HOSPITAL_COMMUNITY)
Admission: RE | Admit: 2016-02-21 | Discharge: 2016-02-21 | Disposition: A | Discharge status: Home / Self Care | Payer: Medicare Other | Source: Ambulatory Visit | Attending: Cardiology | Admitting: Cardiology

## 2016-02-21 DIAGNOSIS — I70203 Unspecified atherosclerosis of native arteries of extremities, bilateral legs: Secondary | ICD-10-CM | POA: Insufficient documentation

## 2016-02-21 DIAGNOSIS — E785 Hyperlipidemia, unspecified: Secondary | ICD-10-CM | POA: Diagnosis not present

## 2016-02-21 DIAGNOSIS — E119 Type 2 diabetes mellitus without complications: Secondary | ICD-10-CM | POA: Diagnosis not present

## 2016-02-21 DIAGNOSIS — I714 Abdominal aortic aneurysm, without rupture, unspecified: Secondary | ICD-10-CM

## 2016-02-21 DIAGNOSIS — Z87891 Personal history of nicotine dependence: Secondary | ICD-10-CM | POA: Diagnosis not present

## 2016-02-21 DIAGNOSIS — I2510 Atherosclerotic heart disease of native coronary artery without angina pectoris: Secondary | ICD-10-CM | POA: Insufficient documentation

## 2016-02-21 DIAGNOSIS — I10 Essential (primary) hypertension: Secondary | ICD-10-CM | POA: Insufficient documentation

## 2016-02-21 DIAGNOSIS — I700 Atherosclerosis of aorta: Secondary | ICD-10-CM | POA: Insufficient documentation

## 2016-04-04 DIAGNOSIS — H2513 Age-related nuclear cataract, bilateral: Secondary | ICD-10-CM | POA: Diagnosis not present

## 2016-04-04 DIAGNOSIS — H01001 Unspecified blepharitis right upper eyelid: Secondary | ICD-10-CM | POA: Diagnosis not present

## 2016-04-04 DIAGNOSIS — E119 Type 2 diabetes mellitus without complications: Secondary | ICD-10-CM | POA: Diagnosis not present

## 2016-04-11 ENCOUNTER — Other Ambulatory Visit: Payer: Self-pay | Admitting: *Deleted

## 2016-04-11 DIAGNOSIS — Z85118 Personal history of other malignant neoplasm of bronchus and lung: Secondary | ICD-10-CM

## 2016-04-11 MED ORDER — ERLOTINIB HCL 150 MG PO TABS: 150.0000 mg | ORAL_TABLET | Freq: Every day | ORAL | 0 refills | Status: DC

## 2016-05-01 ENCOUNTER — Telehealth: Payer: Self-pay | Admitting: Cardiovascular Disease

## 2016-05-01 DIAGNOSIS — I119 Hypertensive heart disease without heart failure: Secondary | ICD-10-CM | POA: Diagnosis not present

## 2016-05-01 DIAGNOSIS — Z23 Encounter for immunization: Secondary | ICD-10-CM | POA: Diagnosis not present

## 2016-05-01 DIAGNOSIS — I2510 Atherosclerotic heart disease of native coronary artery without angina pectoris: Secondary | ICD-10-CM | POA: Diagnosis not present

## 2016-05-01 NOTE — Telephone Encounter (Signed)
I spoke with the pt and this morning he saw his PCP Dr Nyoka Cowden and had an EKG performed.  The pt complained of elevations in BP, palpitations and fluttering in chest that has worsened over the past few days and "blips" of chest pain.  The pt said Dr Nyoka Cowden recommended a nuclear stress test.  I advised the pt that I will obtain a copy of EKG and office note for Dr Burt Knack to review.

## 2016-05-01 NOTE — Telephone Encounter (Signed)
New message    Pt verbalized that the PCP wants him to have a stress test and pt wants Dr.Cooper to order it.

## 2016-05-02 NOTE — Telephone Encounter (Signed)
I contacted Dr Rolly Salter office Abigail Butts) and the pt's office note is not complete at this time.  She will fax the pt's note and EKG to our office for review once it is available.

## 2016-05-03 NOTE — Telephone Encounter (Signed)
Scheduled pt to see Dr. Burt Knack tomorrow morning at [facility] per Ander Purpura.  Pt's wife aware.

## 2016-05-04 ENCOUNTER — Encounter (INDEPENDENT_AMBULATORY_CARE_PROVIDER_SITE_OTHER): Payer: Self-pay

## 2016-05-04 ENCOUNTER — Ambulatory Visit (INDEPENDENT_AMBULATORY_CARE_PROVIDER_SITE_OTHER): Payer: Medicare Other | Admitting: Cardiovascular Disease

## 2016-05-04 ENCOUNTER — Encounter: Payer: Self-pay | Admitting: Cardiovascular Disease

## 2016-05-04 VITALS — BP 140/76 | HR 80 | Ht 73.0 in | Wt 209.1 lb

## 2016-05-04 DIAGNOSIS — I10 Essential (primary) hypertension: Secondary | ICD-10-CM | POA: Diagnosis not present

## 2016-05-04 DIAGNOSIS — E785 Hyperlipidemia, unspecified: Secondary | ICD-10-CM

## 2016-05-04 DIAGNOSIS — I25119 Atherosclerotic heart disease of native coronary artery with unspecified angina pectoris: Secondary | ICD-10-CM | POA: Diagnosis not present

## 2016-05-04 DIAGNOSIS — I714 Abdominal aortic aneurysm, without rupture, unspecified: Secondary | ICD-10-CM

## 2016-05-04 NOTE — Progress Notes (Signed)
Cardiology Office Note Date:  05/04/2016   ID:  [name], DOB 06-Jun-1951, MRN [id number]  PCP:  Criselda Peaches, MD  Cardiologist:  Sherren Mocha, MD    Chief Complaint  Patient presents with  . Palpitations     History of Present Illness: [name] is a 65 y.o. male who presents for  presents for follow-up of CAD. He has had PCI of the RCA and LCx in 2004 and did well until 2015 when he presented with progressive angina. He was found to have severe ISR in the circumflex and underwent PCI with a drug-eluting stent.  He's been under a lot of stress over the last several months with with various family issues. This past weekend he experienced heart palpitations and 'strange feeling' in his chest. He previously has had chest tightness with exertion leading to his last PCI procedure. Not experiencing these symptoms currently. No edema, orthopnea, or PND. Has had elevated blood pressure and increased losartan recently. Blood pressure is now improved on home readings.  Past Medical History:  Diagnosis Date  . AAA (abdominal aortic aneurysm) (HCC)    a. 3.4 x 3.5 on scan 02/28/13.  . Allergic reaction to contrast dye   . Atherosclerosis   . CAD (coronary artery disease)    a. stenting of RCA/LCx 2004. b. [place] 08/2013:  s/p DES to LCx for severe ISR.  . Diabetes mellitus   . Diverticulosis   . Emphysema lung ([place])   . Emphysema of lung ([place])   . Hemorrhoids   . Hepatic steatosis   . HTN (hypertension)   . Hyperlipidemia   . Kidney stone   . Lung cancer ([place]) dx'd 2004   chemo/xrt comp; tarceva ongoing    Past Surgical History:  Procedure Laterality Date  . ANGIOPLASTY / STENTING FEMORAL  2015   2004-groin  . LEFT HEART CATHETERIZATION WITH CORONARY ANGIOGRAM N/A 09/08/2013   Procedure: LEFT HEART CATHETERIZATION WITH CORONARY ANGIOGRAM;  Surgeon: Blane Ohara, MD;  Location: [facility] CATH LAB;  Service: Cardiovascular;  Laterality: N/A;  . left partial lobectomy      . WISDOM TOOTH EXTRACTION      Current Outpatient Prescriptions  Medication Sig Dispense Refill  . aspirin 81 MG chewable tablet Chew 81 mg by mouth daily.    Marland Kitchen atorvastatin (LIPITOR) 40 MG tablet Take 40 mg by mouth daily.    . clopidogrel (PLAVIX) 75 MG tablet TAKE 1 TABLET (75 MG TOTAL) BY MOUTH DAILY. 90 tablet 2  . erlotinib (TARCEVA) 150 MG tablet Take 1 tablet (150 mg total) by mouth daily. Take on an empty stomach 1 hour before meals or 2 hours after. 30 tablet 0  . famotidine (PEPCID) 20 MG tablet Take 20 mg by mouth daily.    Marland Kitchen glimepiride (AMARYL) 4 MG tablet Take 4 mg by mouth 2 (two) times daily.    . isosorbide mononitrate (IMDUR) 30 MG 24 hr tablet TAKE 1 TABLET BY MOUTH ONCE DAILY 90 tablet 1  . Loperamide HCl (IMODIUM A-D) 1 MG/7.5ML LIQD Take 7.5 mLs by mouth as directed.     Marland Kitchen losartan (COZAAR) 50 MG tablet Take 100 mg by mouth daily.    . metFORMIN (GLUCOPHAGE) 850 MG tablet Take 1 tablet (850 mg total) by mouth 2 (two) times daily with a meal.    . metoprolol (LOPRESSOR) 100 MG tablet Take 100 mg by mouth daily. Take one tablet by mouth daily with or following a meal  4  . nitroGLYCERIN (NITROSTAT) 0.4 MG SL tablet Place 1 tablet (0.4 mg total) under the tongue every 5 (five) minutes as needed for chest pain (up to 3 doses). 25 tablet 3  . triazolam (HALCION) 0.25 MG tablet Take 1 tablet by mouth at bedtime.  5   No current facility-administered medications for this visit.     Allergies:   Iohexol; Morphine and related; and Other   Social History:  The patient  reports that he quit smoking about 13 years ago. He has never used smokeless tobacco. He reports that he does not drink alcohol or use drugs.   Family History:  The patient's  family history includes Diabetes in his sister; Heart disease in his sister; Ovarian cancer in his sister.    ROS:  Please see the history of present illness.  Otherwise, review of systems is positive for exertional dyspnea, anxiety.   All other systems are reviewed and negative.    PHYSICAL EXAM: VS:  BP 140/76   Pulse 80   Ht 6' 1"  (1.854 m)   Wt 209 lb 1.9 oz (94.9 kg)   BMI 27.59 kg/m  , BMI Body mass index is 27.59 kg/m. GEN: Well nourished, well developed, in no acute distress  HEENT: normal  Neck: no JVD, no masses. No carotid bruits Cardiac: RRR without murmur or gallop                Respiratory:  clear to auscultation bilaterally, normal work of breathing GI: soft, nontender, nondistended, + BS MS: no deformity or atrophy  Ext: no pretibial edema, pedal pulses 2+= bilaterally Skin: warm and dry, no rash Neuro:  Strength and sensation are intact Psych: euthymic mood, full affect  EKG:  EKG is ordered today. The ekg ordered today shows NSR 81 bpm, within normal limits  Recent Labs: 11/23/2015: ALT 21; BUN 13.9; Creatinine 1.6; HGB 13.2; Platelets 246; Potassium 4.5; Sodium 138   Lipid Panel  No results found for: CHOL, TRIG, HDL, CHOLHDL, VLDL, LDLCALC, LDLDIRECT    Wt Readings from Last 3 Encounters:  05/04/16 209 lb 1.9 oz (94.9 kg)  11/30/15 213 lb 6.4 oz (96.8 kg)  11/12/15 213 lb 3.2 oz (96.7 kg)     Cardiac Studies Reviewed: Cath 09-08-2013: Procedure: Left Heart Cath, Selective Coronary Angiography, LV angiography, PTCA and stenting of the LCx  Indication: CCS Class 3 angina, hx PCI with stenting of the RCA and LCx in 2004. Exertional angina progressive over several months. Sx's occur with low level activity, especially in cold weather.  Procedural Details:  The right wrist was prepped, draped, and anesthetized with 1% lidocaine. Using the modified Seldinger technique, a 5/6 French sheath was introduced into the right radial artery. 3 mg of verapamil was administered through the sheath, weight-based unfractionated heparin was administered intravenously. Standard Judkins catheters were used for selective coronary angiography and left ventriculography. Catheter exchanges were performed over  an exchange length guidewire.  PROCEDURAL FINDINGS Hemodynamics: AO 125/63 LV 127/12              Coronary angiography: Coronary dominance: right  Left mainstem: Arises from the left cusp. Patent without significant disease.  Left anterior descending (LAD): The LAD has mild irregularity. The vessel is essentially patent throughout. I do not appreciate any significant stenoses. The diagonal branches are patent. The vessel reaches the LV apex.  Left circumflex (LCx): The left circumflex has a long stented segment in its midportion. There is diffuse 50% in-stent restenosis leading  into a severe 95% stenosis at the distal stent edge. The stent extends into the second OM branch which has no significant disease after the tight 95% stenosis the  Right coronary artery (RCA): The RCA is a large, dominant vessel. The proximal stent is patent without restenosis. The mid vessel has a napkin ring 40-50% stenosis. The distal vessel is patent. The PDA and PLA branches are patent without stenosis..  Left ventriculography: Left ventricular systolic function is normal, LVEF is estimated at 55-65%, there is no significant mitral regurgitation   PCI Note:  Following the diagnostic procedure, the decision was made to proceed with PCI of the left circumflex. Additional heparin was given for anticoagulation. Once a therapeutic ACT was achieved, a 6 [place] XB guide catheter was inserted.  A cougar coronary guidewire was used to cross the lesion.  The lesion was predilated with a 2.5 x 12 mm balloon.  The lesion was then stented with a 2.5 x 24 mm Promus Premier drug-eluting stent.  The stent was postdilated with a 2.75 mm noncompliant balloon to 18 atm.  Following PCI, there was 0% residual stenosis and TIMI-3 flow. The stent extended from the mid-portion of the previously implanted stent beyond the distal edge of the old stent. Final angiography confirmed an excellent result. The patient tolerated the procedure  well. There were no immediate procedural complications. A TR band was used for radial hemostasis. The patient was transferred to the post catheterization recovery area for further monitoring.  PCI Data: Vessel - LCx/Segment - OM2 Percent Stenosis (pre)  95 TIMI-flow 3 Stent 2.5x24 mm Promus DES Percent Stenosis (post) 0 TIMI-flow (post) 3  Final Conclusions:   1. 2 vessel CAD with patency of the previously implanted RCA stent and severe ISR of the LCx stent.  2. Normal LV function 3. Successful PCI of the LCx with a single DES 4. Patency of the LAD without significant stenosis   Recommendations:  DAPT with ASA and plavix for at least 12 months.   ASSESSMENT AND PLAN: 1.  CAD, native vessel, with angina. He has developed symptoms with typical and atypical features. Considering his known CAD and multiple PCI procedures, have recommended we proceed with an exercise Myoview stress test prefer further risk stratification. EKG is reviewed and shows no significant abnormalities.  2. Essential hypertension: Blood pressure has been elevated, but is now improved after increase of losartan.  3. Hyperlipidemia: treated with atorvastatin 40 mg. Lipids followed by Dr Nyoka Cowden.  4. Abdominal aortic aneurysm: Abdominal aortic duplex is reviewed from July 2017 with AAA measurements 3.6 x 3.8 cm. He continues with yearly surveillance.   Current medicines are reviewed with the patient today.  The patient does not have concerns regarding medicines.  Labs/ tests ordered today include:   Orders Placed This Encounter  Procedures  . Myocardial Perfusion Imaging  . EKG 12-Lead   Disposition:   FU one year unless stress test result requires further evaluation  Signed, Sherren Mocha, MD  05/04/2016 10:39 AM    [facility] [address], [place], [place]  [address] Phone: [phone]; Fax: [phone]

## 2016-05-04 NOTE — Patient Instructions (Addendum)
Medication Instructions:  Your physician recommends that you continue on your current medications as directed. Please refer to the Current Medication list given to you today.  Labwork: No new orders.   Testing/Procedures: Your physician has requested that you have an exercise stress myoview. For further information please visit [email or web address]. Please follow instruction sheet, as given.  Your physician has requested that you have an abdominal aorta duplex in July 2018. During this test, an ultrasound is used to evaluate the aorta. Allow 30 minutes for this exam. Do not eat after midnight the day before and avoid carbonated beverages  Follow-Up: Your physician wants you to follow-up in: 1 YEAR with Dr Burt Knack.  You will receive a reminder letter in the mail two months in advance. If you don't receive a letter, please call our office to schedule the follow-up appointment.   Any Other Special Instructions Will Be Listed Below (If Applicable).     If you need a refill on your cardiac medications before your next appointment, please call your pharmacy.

## 2016-05-10 ENCOUNTER — Telehealth (HOSPITAL_COMMUNITY): Payer: Self-pay | Admitting: *Deleted

## 2016-05-10 NOTE — Telephone Encounter (Signed)
Patient given detailed instructions per Myocardial Perfusion Study Information Sheet for the test on 05/11/16. Patient notified to arrive 15 minutes early and that it is imperative to arrive on time for appointment to keep from having the test rescheduled.  If you need to cancel or reschedule your appointment, please call the office within 24 hours of your appointment. Failure to do so may result in a cancellation of your appointment, and a $50 no show fee. Patient verbalized understanding. [name]

## 2016-05-11 ENCOUNTER — Ambulatory Visit (HOSPITAL_COMMUNITY): Payer: Medicare Other | Attending: Cardiology

## 2016-05-11 DIAGNOSIS — R079 Chest pain, unspecified: Secondary | ICD-10-CM | POA: Insufficient documentation

## 2016-05-11 DIAGNOSIS — E785 Hyperlipidemia, unspecified: Secondary | ICD-10-CM

## 2016-05-11 DIAGNOSIS — R002 Palpitations: Secondary | ICD-10-CM | POA: Diagnosis not present

## 2016-05-11 DIAGNOSIS — I10 Essential (primary) hypertension: Secondary | ICD-10-CM

## 2016-05-11 DIAGNOSIS — I25119 Atherosclerotic heart disease of native coronary artery with unspecified angina pectoris: Secondary | ICD-10-CM

## 2016-05-11 DIAGNOSIS — I714 Abdominal aortic aneurysm, without rupture, unspecified: Secondary | ICD-10-CM

## 2016-05-11 LAB — MYOCARDIAL PERFUSION IMAGING
CHL CUP MPHR: 154 {beats}/min
CHL CUP NUCLEAR SDS: 3
CHL CUP NUCLEAR SRS: 1
CHL CUP RESTING HR STRESS: 69 {beats}/min
CSEPPHR: 137 {beats}/min
Estimated workload: 6.6 METS
Exercise duration (min): 4 min
Exercise duration (sec): 45 s
LV sys vol: 73 mL
LVDIAVOL: 115 mL (ref 62–150)
Percent HR: 88 %
RATE: 0.31
SSS: 4
TID: 0.98

## 2016-05-11 MED ORDER — TECHNETIUM TC 99M TETROFOSMIN IV KIT
32.6000 | PACK | Freq: Once | INTRAVENOUS | Status: AC | PRN
  Administered 2016-05-11: 33 via INTRAVENOUS
  Filled 2016-05-11: qty 33

## 2016-05-11 MED ORDER — TECHNETIUM TC 99M TETROFOSMIN IV KIT
10.5000 | PACK | Freq: Once | INTRAVENOUS | Status: AC | PRN
  Administered 2016-05-11: 11 via INTRAVENOUS
  Filled 2016-05-11: qty 11

## 2016-05-12 ENCOUNTER — Telehealth: Payer: Self-pay | Admitting: Cardiovascular Disease

## 2016-05-12 DIAGNOSIS — R002 Palpitations: Secondary | ICD-10-CM

## 2016-05-12 DIAGNOSIS — I25119 Atherosclerotic heart disease of native coronary artery with unspecified angina pectoris: Secondary | ICD-10-CM

## 2016-05-12 NOTE — Telephone Encounter (Signed)
[name] is calling to get results of his stress test in which he had on yesterday 05/11/16 . Please call. Thanks

## 2016-05-15 NOTE — Telephone Encounter (Signed)
Please review pt's myoview report. Thank you!

## 2016-05-16 NOTE — Telephone Encounter (Signed)
New message   Pt verbalized that he is calling for the results of his test

## 2016-05-16 NOTE — Telephone Encounter (Signed)
Notes Recorded by Barkley Boards, RN on 05/16/2016 at 1:27 PM EDT Pt aware of results by phone. Echo scheduled on 05/26/16. I will contact the pt if there is a cancellation on the Echo schedule. ------  Notes Recorded by Sherren Mocha, MD on 05/16/2016 at 6:19 AM EDT Study reviewed. No ischemia but LVEF reduced. Recommend 2D echo to evaluate LV function. Will make decision whether further evaluation once echo is completed. If LVEF normal, no further testing. If LVEF significantly reduced, will need to consider coronary angiography.

## 2016-05-23 ENCOUNTER — Telehealth: Payer: Self-pay | Admitting: *Deleted

## 2016-05-23 NOTE — Telephone Encounter (Signed)
FYI "I have questions for the nurse about the CT scan scheduled October 17 th.  Will any of my abdomen or intestines be seen?"  I'm having difficulties.  Cardiologist will do test October 13 th but has basically ruled out heart.  I have lung cancer and need to know what's going on.  I had colonoscopy a year ago and an upper GI several years back.  Currently take Pepcid."

## 2016-05-24 NOTE — Telephone Encounter (Signed)
I called pt and told him CT is chest only.  Feels like food gets stuck when he swallows rice, bread for example x several years -taking pepcid and sometimes it helps and sometimes it doesn't-he denies N/V/D/C or pain.   His PCP and cardiologist ordered EKG , stress test and he reported all normal. . Note to [facility].

## 2016-05-24 NOTE — Telephone Encounter (Signed)
CT chest is good to r/o any cancer related swallowing issues.

## 2016-05-25 NOTE — Telephone Encounter (Signed)
Wife notified.

## 2016-05-26 ENCOUNTER — Ambulatory Visit (HOSPITAL_COMMUNITY): Payer: Medicare Other | Attending: Cardiology

## 2016-05-26 ENCOUNTER — Other Ambulatory Visit: Payer: Self-pay

## 2016-05-26 DIAGNOSIS — I25119 Atherosclerotic heart disease of native coronary artery with unspecified angina pectoris: Secondary | ICD-10-CM | POA: Diagnosis not present

## 2016-05-26 DIAGNOSIS — R002 Palpitations: Secondary | ICD-10-CM | POA: Diagnosis not present

## 2016-05-26 DIAGNOSIS — I351 Nonrheumatic aortic (valve) insufficiency: Secondary | ICD-10-CM | POA: Diagnosis not present

## 2016-05-26 DIAGNOSIS — I5030 Unspecified diastolic (congestive) heart failure: Secondary | ICD-10-CM | POA: Insufficient documentation

## 2016-05-30 ENCOUNTER — Ambulatory Visit (HOSPITAL_COMMUNITY)
Admission: RE | Admit: 2016-05-30 | Discharge: 2016-05-30 | Disposition: A | Discharge status: Home / Self Care | Payer: Medicare Other | Source: Ambulatory Visit | Attending: Internal Medicine | Admitting: Internal Medicine

## 2016-05-30 ENCOUNTER — Other Ambulatory Visit (HOSPITAL_BASED_OUTPATIENT_CLINIC_OR_DEPARTMENT_OTHER): Payer: Medicare Other

## 2016-05-30 ENCOUNTER — Encounter (HOSPITAL_COMMUNITY): Payer: Self-pay

## 2016-05-30 DIAGNOSIS — C3490 Malignant neoplasm of unspecified part of unspecified bronchus or lung: Secondary | ICD-10-CM | POA: Diagnosis not present

## 2016-05-30 DIAGNOSIS — C3412 Malignant neoplasm of upper lobe, left bronchus or lung: Secondary | ICD-10-CM | POA: Insufficient documentation

## 2016-05-30 DIAGNOSIS — I2510 Atherosclerotic heart disease of native coronary artery without angina pectoris: Secondary | ICD-10-CM | POA: Insufficient documentation

## 2016-05-30 DIAGNOSIS — Z9889 Other specified postprocedural states: Secondary | ICD-10-CM | POA: Diagnosis not present

## 2016-05-30 DIAGNOSIS — I700 Atherosclerosis of aorta: Secondary | ICD-10-CM | POA: Diagnosis not present

## 2016-05-30 LAB — COMPREHENSIVE METABOLIC PANEL
ALT: 18 U/L (ref 0–55)
ANION GAP: 11 meq/L (ref 3–11)
AST: 19 U/L (ref 5–34)
Albumin: 3.8 g/dL (ref 3.5–5.0)
Alkaline Phosphatase: 92 U/L (ref 40–150)
BILIRUBIN TOTAL: 1.1 mg/dL (ref 0.20–1.20)
BUN: 18.2 mg/dL (ref 7.0–26.0)
CHLORIDE: 104 meq/L (ref 98–109)
CO2: 22 meq/L (ref 22–29)
Calcium: 9.6 mg/dL (ref 8.4–10.4)
Creatinine: 1.7 mg/dL — ABNORMAL HIGH (ref 0.7–1.3)
EGFR: 41 mL/min/{1.73_m2} — AB (ref >90)
Glucose: 115 mg/dl (ref 70–140)
Potassium: 5.1 mEq/L (ref 3.5–5.1)
Sodium: 137 mEq/L (ref 136–145)
TOTAL PROTEIN: 7.6 g/dL (ref 6.4–8.3)

## 2016-05-30 LAB — CBC WITH DIFFERENTIAL/PLATELET
BASO%: 0.4 % (ref 0.0–2.0)
BASOS ABS: 0 10*3/uL (ref 0.0–0.1)
EOS ABS: 0.2 10*3/uL (ref 0.0–0.5)
EOS%: 2.1 % (ref 0.0–7.0)
HCT: 39.6 % (ref 38.4–49.9)
HGB: 13.7 g/dL (ref 13.0–17.1)
LYMPH%: 25 % (ref 14.0–49.0)
MCH: 29.7 pg (ref 27.2–33.4)
MCHC: 34.6 g/dL (ref 32.0–36.0)
MCV: 85.7 fL (ref 79.3–98.0)
MONO#: 1 10*3/uL — AB (ref 0.1–0.9)
MONO%: 9 % (ref 0.0–14.0)
NEUT#: 6.8 10*3/uL — ABNORMAL HIGH (ref 1.5–6.5)
NEUT%: 63.5 % (ref 39.0–75.0)
PLATELETS: 237 10*3/uL (ref 140–400)
RBC: 4.62 10*6/uL (ref 4.20–5.82)
RDW: 14.4 % (ref 11.0–14.6)
WBC: 10.7 10*3/uL — ABNORMAL HIGH (ref 4.0–10.3)
lymph#: 2.7 10*3/uL (ref 0.9–3.3)

## 2016-05-30 MED ORDER — IOPAMIDOL (ISOVUE-300) INJECTION 61%
75.0000 mL | Freq: Once | INTRAVENOUS | Status: AC | PRN
  Administered 2016-05-30: 75 mL via INTRAVENOUS

## 2016-05-31 ENCOUNTER — Telehealth: Payer: Self-pay | Admitting: Cardiovascular Disease

## 2016-05-31 NOTE — Telephone Encounter (Signed)
Notes Recorded by Barkley Boards, RN on 05/31/2016 at 4:16 PM EDT 22 min phone call. Pt aware of echo results by phone.  I spoke with the pt and made him aware of findings in regards to aortic valve. The pt was made aware to contact the office if he develops SOB, swelling, dizziness, syncope or chest pain. The pt did want Dr Burt Knack to be aware that he continues to have a "strange feeling" in his chest at times and after much discussion it may have an anxiety component. The pt will continue to monitor these symptoms and contact the office if symptoms worsen or continue to occur. The pt was very appreciative of my call.

## 2016-05-31 NOTE — Telephone Encounter (Signed)
Follow up ° ° ° °Pt verbalized that he is returning call for rn °

## 2016-05-31 NOTE — Telephone Encounter (Signed)
Left message on machine for pt to contact the office.   

## 2016-05-31 NOTE — Telephone Encounter (Signed)
Pt  Had Echo on Friday , calling for results  pls call [phone]

## 2016-06-06 ENCOUNTER — Ambulatory Visit (HOSPITAL_BASED_OUTPATIENT_CLINIC_OR_DEPARTMENT_OTHER): Payer: Medicare Other | Admitting: Internal Medicine

## 2016-06-06 ENCOUNTER — Encounter: Payer: Self-pay | Admitting: Internal Medicine

## 2016-06-06 VITALS — BP 129/75 | HR 75 | Temp 97.7°F | Resp 18 | Ht 73.0 in | Wt 208.7 lb

## 2016-06-06 DIAGNOSIS — R1310 Dysphagia, unspecified: Secondary | ICD-10-CM | POA: Diagnosis not present

## 2016-06-06 DIAGNOSIS — Z5181 Encounter for therapeutic drug level monitoring: Secondary | ICD-10-CM

## 2016-06-06 DIAGNOSIS — C3412 Malignant neoplasm of upper lobe, left bronchus or lung: Secondary | ICD-10-CM

## 2016-06-06 HISTORY — DX: Encounter for therapeutic drug level monitoring: Z51.81

## 2016-06-06 NOTE — Progress Notes (Signed)
[place] Telephone:([phone]) [phone]   Fax:(336) [phone]  OFFICE PROGRESS NOTE  GRE[place], [name], [address], [address] [place] [address]  DIAGNOSIS: Metastatic non-small cell lung cancer, adenocarcinoma initially diagnosed in December of 2004  PRIOR THERAPY: 1) status post course of concurrent chemoradiation under the care of Dr. Truddie Coco. 2) status post left upper lobe wedge resection on 01/10/2005 for recurrent disease in the left lung.  CURRENT THERAPY: Tarceva 150 mg by mouth daily started in 2006.  DISEASE STAGE: Stage IV  CHEMOTHERAPY INTENT: Palliative  CURRENT # OF CHEMOTHERAPY CYCLES: 12 years  CURRENT ANTIEMETICS: None  CURRENT SMOKING STATUS: Former smoker, quit January 2004  ORAL CHEMOTHERAPY AND CONSENT: Yes  CURRENT BISPHOSPHONATES USE: None  PAIN MANAGEMENT: None  NARCOTICS INDUCED CONSTIPATION: None  LIVING WILL AND CODE STATUS: Patient states that he requested a DO NOT RESUSCITATE when he was diagnosed in 2004 however now that he is 11 years out from diagnosis he would like to change this to limited code.  INTERVAL HISTORY: [name] 65 y.o. male returns to the clinic today for annual followup visit. The patient is feeling fine today with no specific complaints. Has been complaining recently of intermittent dysphagia especially when he eats meats or chicken. The patient has been on Tarceva 150 mg by mouth daily since 2006. He is tolerating it fairly well with no significant adverse effects except for occasional episodes of diarrhea and mild skin rash. He denied having any significant fever or chills, no nausea or vomiting. The patient denied having any significant chest pain, shortness of breath, cough or hemoptysis. He had repeat CT scan of the chest performed recently and he is here for evaluation and discussion of his scan results.  MEDICAL HISTORY: Past Medical History:  Diagnosis Date  . AAA (abdominal aortic  aneurysm) (HCC)    a. 3.4 x 3.5 on scan 02/28/13.  . Allergic reaction to contrast dye   . Atherosclerosis   . CAD (coronary artery disease)    a. stenting of RCA/LCx 2004. b. [place] 08/2013:  s/p DES to LCx for severe ISR.  . Diabetes mellitus   . Diverticulosis   . Emphysema lung ([place])   . Emphysema of lung ([place])   . Hemorrhoids   . Hepatic steatosis   . HTN (hypertension)   . Hyperlipidemia   . Kidney stone   . Lung cancer ([facility]) dx'd 2004   chemo/xrt comp; tarceva ongoing    ALLERGIES:  is allergic to iohexol; morphine and related; and other.  MEDICATIONS:  Current Outpatient Prescriptions  Medication Sig Dispense Refill  . aspirin 81 MG chewable tablet Chew 81 mg by mouth daily.    Marland Kitchen atorvastatin (LIPITOR) 40 MG tablet Take 40 mg by mouth daily.    . clopidogrel (PLAVIX) 75 MG tablet TAKE 1 TABLET (75 MG TOTAL) BY MOUTH DAILY. 90 tablet 2  . erlotinib (TARCEVA) 150 MG tablet Take 1 tablet (150 mg total) by mouth daily. Take on an empty stomach 1 hour before meals or 2 hours after. 30 tablet 0  . famotidine (PEPCID) 20 MG tablet Take 20 mg by mouth daily.    Marland Kitchen glimepiride (AMARYL) 4 MG tablet Take 4 mg by mouth 2 (two) times daily.    . isosorbide mononitrate (IMDUR) 30 MG 24 hr tablet TAKE 1 TABLET BY MOUTH ONCE DAILY 90 tablet 1  . Loperamide HCl (IMODIUM A-D) 1 MG/7.5ML LIQD Take 7.5 mLs by  mouth as directed.     Marland Kitchen losartan (COZAAR) 50 MG tablet Take 100 mg by mouth daily.    . metFORMIN (GLUCOPHAGE) 850 MG tablet Take 1 tablet (850 mg total) by mouth 2 (two) times daily with a meal.    . metoprolol (LOPRESSOR) 100 MG tablet Take 100 mg by mouth daily. Take one tablet by mouth daily with or following a meal  4  . triazolam (HALCION) 0.25 MG tablet Take 1 tablet by mouth at bedtime.  5  . nitroGLYCERIN (NITROSTAT) 0.4 MG SL tablet Place 1 tablet (0.4 mg total) under the tongue every 5 (five) minutes as needed for chest pain (up to 3 doses). (Patient not taking: Reported on  06/06/2016) 25 tablet 3   No current facility-administered medications for this visit.     REVIEW OF SYSTEMS:  Constitutional: negative Eyes: negative Ears, nose, mouth, throat, and face: negative Respiratory: negative Cardiovascular: negative Gastrointestinal: positive for dysphagia Genitourinary:negative Integument/breast: negative Hematologic/lymphatic: negative Musculoskeletal:negative Neurological: negative Behavioral/Psych: negative Endocrine: negative Allergic/Immunologic: negative   PHYSICAL EXAMINATION: General appearance: alert, cooperative and no distress Head: Normocephalic, without obvious abnormality, atraumatic Neck: no adenopathy Lymph nodes: Cervical, supraclavicular, and axillary nodes normal. Resp: clear to auscultation bilaterally Cardio: regular rate and rhythm, S1, S2 normal, no murmur, click, rub or gallop GI: soft, non-tender; bowel sounds normal; no masses,  no organomegaly Extremities: extremities normal, atraumatic, no cyanosis or edema Neurologic: Alert and oriented X 3, normal strength and tone. Normal symmetric reflexes. Normal coordination and gait  ECOG PERFORMANCE STATUS: 0 - Asymptomatic  Blood pressure 129/75, pulse 75, temperature 97.7 F (36.5 C), temperature source Oral, resp. rate 18, height '6\' 1"'$  (1.854 m), weight 208 lb 11.2 oz (94.7 kg), SpO2 98 %.  LABORATORY DATA: Lab Results  Component Value Date   WBC 10.7 (H) 05/30/2016   HGB 13.7 05/30/2016   HCT 39.6 05/30/2016   MCV 85.7 05/30/2016   PLT 237 05/30/2016      Chemistry      Component Value Date/Time   NA 137 05/30/2016 1047   K 5.1 05/30/2016 1047   CL 101 09/09/2013 0715   CL 106 06/10/2012 0900   CO2 22 05/30/2016 1047   BUN 18.2 05/30/2016 1047   CREATININE 1.7 (H) 05/30/2016 1047      Component Value Date/Time   CALCIUM 9.6 05/30/2016 1047   ALKPHOS 92 05/30/2016 1047   AST 19 05/30/2016 1047   ALT 18 05/30/2016 1047   BILITOT 1.10 05/30/2016 1047        RADIOGRAPHIC STUDIES: Ct Chest W Contrast  Result Date: 05/30/2016 CLINICAL DATA:  Followup lung cancer EXAM: CT CHEST WITH CONTRAST TECHNIQUE: Multidetector CT imaging of the chest was performed during intravenous contrast administration. CONTRAST:  89m ISOVUE-300 IOPAMIDOL (ISOVUE-300) INJECTION 61% COMPARISON:  11/23/2015 FINDINGS: Cardiovascular: The heart size appears normal. No pericardial effusion identified. Aortic atherosclerosis. Calcifications involving the RCA, LAD and left circumflex coronary arteries noted. Mediastinum/Nodes: The trachea appears patent and is midline. Normal appearance of the esophagus. Lungs/Pleura: No pleural fluid identified. Moderate to advanced changes of centrilobular and paraseptal emphysema. Mild diffuse bronchial wall thickening is identified. Status post wedge resection surgery from the left upper lobe. Nodular soft tissue thickening along the suture line measures 10 mm in thickness, image 69 of series 5. Unchanged from previous exam. Perifissural nodule along the right midlung measures 6 mm, not significantly changed from previous exam. 4 mm right lower lobe lung nodule is again identified and appears unchanged, image  95 of series 5. Upper Abdomen: The adrenal glands are normal. Low-attenuation structure within the posterior right lobe of liver measures 11 mm, image 164 of series 2. Unchanged from previous exam. No acute findings identified within the upper abdomen. Musculoskeletal: No aggressive lytic or sclerotic bone lesions. Chronic deformities involving the posterior aspect of the right ninth and eighth ribs appears similar to previous exam. IMPRESSION: 1. Stable postoperative changes compatible with wedge resection surgery in the left upper lobe. Nodular soft tissue thickening along the resection site is stable compared with previous exam. 2. Aortic atherosclerosis and coronary artery calcification. Electronically Signed   By: Kerby Moors M.D.   On:  05/30/2016 16:42    ASSESSMENT/PLAN: This is a very pleasant 65 years old white male with history of metastatic non-small cell lung cancer, adenocarcinoma. He is currently on treatment with Tarceva for the last 11 years and tolerating it fairly well. The patient is doing fine today with no specific complaints.  The recent CT scan of the chest showed no concerning findings to explain his dysphagia or any evidence of disease recurrence. I recommended for the patient to continue his current treatment with Tarceva 150 mg by mouth daily. For the dysphagia, I recommended for him to see his gastroenterologist for consideration of upper endoscopy. The patient would come back for follow-up visit in 6 months with repeat lab and chest x-ray. For the renal insufficiency, the patient will continue his evaluation and management by his primary care physician. He was advised to call immediately if he has any concerning symptoms in the interval.  All questions were answered. The patient knows to call the clinic with any problems, questions or concerns. We can certainly see the patient much sooner if necessary.  Disclaimer: This note was dictated with voice recognition software. Similar sounding words can inadvertently be transcribed and may not be corrected upon review.

## 2016-06-08 ENCOUNTER — Other Ambulatory Visit: Payer: Self-pay | Admitting: Medical Oncology

## 2016-06-08 DIAGNOSIS — Z85118 Personal history of other malignant neoplasm of bronchus and lung: Secondary | ICD-10-CM

## 2016-06-08 MED ORDER — ERLOTINIB HCL 150 MG PO TABS: 150.0000 mg | ORAL_TABLET | Freq: Every day | ORAL | 2 refills | Status: DC

## 2016-06-12 ENCOUNTER — Telehealth: Payer: Self-pay | Admitting: Internal Medicine

## 2016-06-12 DIAGNOSIS — I2510 Atherosclerotic heart disease of native coronary artery without angina pectoris: Secondary | ICD-10-CM | POA: Diagnosis not present

## 2016-06-12 DIAGNOSIS — I739 Peripheral vascular disease, unspecified: Secondary | ICD-10-CM | POA: Diagnosis not present

## 2016-06-12 NOTE — Telephone Encounter (Signed)
Spoke with wife re lab/fu April

## 2016-07-13 DIAGNOSIS — I739 Peripheral vascular disease, unspecified: Secondary | ICD-10-CM | POA: Diagnosis not present

## 2016-08-08 DIAGNOSIS — J209 Acute bronchitis, unspecified: Secondary | ICD-10-CM | POA: Diagnosis not present

## 2016-09-11 ENCOUNTER — Other Ambulatory Visit: Payer: Self-pay | Admitting: Medical Oncology

## 2016-09-11 DIAGNOSIS — Z85118 Personal history of other malignant neoplasm of bronchus and lung: Secondary | ICD-10-CM

## 2016-09-11 MED ORDER — ERLOTINIB HCL 150 MG PO TABS: 150.0000 mg | ORAL_TABLET | Freq: Every day | ORAL | 2 refills | Status: DC

## 2016-10-13 ENCOUNTER — Telehealth: Payer: Self-pay | Admitting: Medical Oncology

## 2016-10-13 NOTE — Telephone Encounter (Signed)
Dr Nyoka Cowden wanted to know if [name] is okay with pt receiving 2-part shingle vaccine? Note to [facility].

## 2016-10-16 ENCOUNTER — Telehealth: Payer: Self-pay | Admitting: Medical Oncology

## 2016-10-16 NOTE — Telephone Encounter (Signed)
Yes Ok with me 

## 2016-10-16 NOTE — Telephone Encounter (Signed)
Per Julien Nordmann 'Yes. [place] with me." to get 2 part shingles vaccine-left message on phone.

## 2016-12-05 ENCOUNTER — Other Ambulatory Visit (HOSPITAL_BASED_OUTPATIENT_CLINIC_OR_DEPARTMENT_OTHER): Payer: Medicare Other

## 2016-12-05 ENCOUNTER — Ambulatory Visit (HOSPITAL_BASED_OUTPATIENT_CLINIC_OR_DEPARTMENT_OTHER): Payer: Medicare Other | Admitting: Internal Medicine

## 2016-12-05 ENCOUNTER — Ambulatory Visit (HOSPITAL_COMMUNITY)
Admission: RE | Admit: 2016-12-05 | Discharge: 2016-12-05 | Disposition: A | Discharge status: Home / Self Care | Payer: Medicare Other | Source: Ambulatory Visit | Attending: Internal Medicine | Admitting: Internal Medicine

## 2016-12-05 ENCOUNTER — Encounter: Payer: Self-pay | Admitting: Internal Medicine

## 2016-12-05 VITALS — BP 109/65 | HR 88 | Temp 98.0°F | Resp 18 | Ht 73.0 in | Wt 213.8 lb

## 2016-12-05 DIAGNOSIS — I700 Atherosclerosis of aorta: Secondary | ICD-10-CM | POA: Diagnosis not present

## 2016-12-05 DIAGNOSIS — C3412 Malignant neoplasm of upper lobe, left bronchus or lung: Secondary | ICD-10-CM

## 2016-12-05 DIAGNOSIS — Z9889 Other specified postprocedural states: Secondary | ICD-10-CM | POA: Diagnosis not present

## 2016-12-05 DIAGNOSIS — Z5181 Encounter for therapeutic drug level monitoring: Secondary | ICD-10-CM

## 2016-12-05 LAB — CBC WITH DIFFERENTIAL/PLATELET
BASO%: 0.7 % (ref 0.0–2.0)
Basophils Absolute: 0.1 10*3/uL (ref 0.0–0.1)
EOS ABS: 0.2 10*3/uL (ref 0.0–0.5)
EOS%: 2.5 % (ref 0.0–7.0)
HCT: 38.9 % (ref 38.4–49.9)
HEMOGLOBIN: 13.4 g/dL (ref 13.0–17.1)
LYMPH%: 25.7 % (ref 14.0–49.0)
MCH: 29.4 pg (ref 27.2–33.4)
MCHC: 34.4 g/dL (ref 32.0–36.0)
MCV: 85.3 fL (ref 79.3–98.0)
MONO#: 0.8 10*3/uL (ref 0.1–0.9)
MONO%: 8.8 % (ref 0.0–14.0)
NEUT%: 62.3 % (ref 39.0–75.0)
NEUTROS ABS: 5.7 10*3/uL (ref 1.5–6.5)
Platelets: 214 10*3/uL (ref 140–400)
RBC: 4.56 10*6/uL (ref 4.20–5.82)
RDW: 15.4 % — AB (ref 11.0–14.6)
WBC: 9.1 10*3/uL (ref 4.0–10.3)
lymph#: 2.3 10*3/uL (ref 0.9–3.3)

## 2016-12-05 LAB — COMPREHENSIVE METABOLIC PANEL
ALBUMIN: 3.8 g/dL (ref 3.5–5.0)
ALK PHOS: 77 U/L (ref 40–150)
ALT: 37 U/L (ref 0–55)
AST: 23 U/L (ref 5–34)
Anion Gap: 8 mEq/L (ref 3–11)
BILIRUBIN TOTAL: 0.66 mg/dL (ref 0.20–1.20)
BUN: 18.7 mg/dL (ref 7.0–26.0)
CO2: 25 meq/L (ref 22–29)
CREATININE: 1.6 mg/dL — AB (ref 0.7–1.3)
Calcium: 9.4 mg/dL (ref 8.4–10.4)
Chloride: 105 mEq/L (ref 98–109)
EGFR: 44 mL/min/{1.73_m2} — ABNORMAL LOW (ref >90)
GLUCOSE: 245 mg/dL — AB (ref 70–140)
Potassium: 5 mEq/L (ref 3.5–5.1)
SODIUM: 138 meq/L (ref 136–145)
TOTAL PROTEIN: 7.1 g/dL (ref 6.4–8.3)

## 2016-12-05 NOTE — Progress Notes (Signed)
[place] Telephone:(336) [phone]   Fax:(336) [phone]  OFFICE PROGRESS NOTE  [place], [name], [address], [address] [place] [address]  DIAGNOSIS: Metastatic non-small cell lung cancer, adenocarcinoma initially diagnosed in December of 2004  PRIOR THERAPY: 1) status post course of concurrent chemoradiation under the care of Dr. Truddie Coco. 2) status post left upper lobe wedge resection on 01/10/2005 for recurrent disease in the left lung.  CURRENT THERAPY: Tarceva 150 mg by mouth daily started in 2006.  INTERVAL HISTORY: [name] 66 y.o. male returns to the clinic today for follow-up visit. The patient is feeling fine with no specific complaints. He has been on treatment with Tarceva 150 mg by mouth daily since 2006 and tolerating this treatment well with no significant adverse effects. He denied having any skin rash or diarrhea. He denied having any chest pain, shortness of breath, cough or hemoptysis. He has no fever or chills. He denied having any nausea or vomiting. He is here today for evaluation with repeat blood work as well as chest x-ray.  MEDICAL HISTORY: Past Medical History:  Diagnosis Date  . AAA (abdominal aortic aneurysm) (HCC)    a. 3.4 x 3.5 on scan 02/28/13.  . Allergic reaction to contrast dye   . Atherosclerosis   . CAD (coronary artery disease)    a. stenting of RCA/LCx 2004. b. [place] 08/2013:  s/p DES to LCx for severe ISR.  . Diabetes mellitus   . Diverticulosis   . Emphysema lung ([place])   . Emphysema of lung ([place])   . Encounter for therapeutic drug monitoring 06/06/2016  . Hemorrhoids   . Hepatic steatosis   . HTN (hypertension)   . Hyperlipidemia   . Kidney stone   . Lung cancer ([place]) dx'd 2004   chemo/xrt comp; tarceva ongoing    ALLERGIES:  is allergic to iohexol; morphine and related; and other.  MEDICATIONS:  Current Outpatient Prescriptions  Medication Sig Dispense Refill  . amLODipine (NORVASC) 2.5 MG  tablet Take 2.5 mg by mouth daily.  5  . aspirin 81 MG chewable tablet Chew 81 mg by mouth daily.    Marland Kitchen atorvastatin (LIPITOR) 40 MG tablet Take 40 mg by mouth daily.    . clopidogrel (PLAVIX) 75 MG tablet TAKE 1 TABLET (75 MG TOTAL) BY MOUTH DAILY. 90 tablet 2  . erlotinib (TARCEVA) 150 MG tablet Take 1 tablet (150 mg total) by mouth daily. Take on an empty stomach 1 hour before meals or 2 hours after. 30 tablet 2  . glimepiride (AMARYL) 4 MG tablet Take 4 mg by mouth 2 (two) times daily.    . isosorbide mononitrate (IMDUR) 30 MG 24 hr tablet TAKE 1 TABLET BY MOUTH ONCE DAILY 90 tablet 1  . losartan (COZAAR) 100 MG tablet TAKE ONE TABLET BY MOUTH EVERYDAY  5  . metFORMIN (GLUCOPHAGE) 850 MG tablet Take 1 tablet (850 mg total) by mouth 2 (two) times daily with a meal.    . metoprolol (LOPRESSOR) 100 MG tablet Take 100 mg by mouth daily. Take one tablet by mouth daily with or following a meal  4  . omeprazole (PRILOSEC) 20 MG capsule Take 20 mg by mouth 2 (two) times daily.  5  . Loperamide HCl (IMODIUM A-D) 1 MG/7.5ML LIQD Take 7.5 mLs by mouth as directed.     . nitroGLYCERIN (NITROSTAT) 0.4 MG SL tablet Place 1 tablet (0.4 mg total) under the tongue every 5 (  five) minutes as needed for chest pain (up to 3 doses). (Patient not taking: Reported on 06/06/2016) 25 tablet 3   No current facility-administered medications for this visit.     REVIEW OF SYSTEMS:  A comprehensive review of systems was negative.   PHYSICAL EXAMINATION: General appearance: alert, cooperative and no distress Head: Normocephalic, without obvious abnormality, atraumatic Neck: no adenopathy Lymph nodes: Cervical, supraclavicular, and axillary nodes normal. Resp: clear to auscultation bilaterally Back: symmetric, no curvature. ROM normal. No CVA tenderness. Cardio: regular rate and rhythm, S1, S2 normal, no murmur, click, rub or gallop GI: soft, non-tender; bowel sounds normal; no masses,  no organomegaly Extremities:  extremities normal, atraumatic, no cyanosis or edema  ECOG PERFORMANCE STATUS: 0 - Asymptomatic  Blood pressure 109/65, pulse 88, temperature 98 F (36.7 C), temperature source Oral, resp. rate 18, height 6' 1"  (1.854 m), weight 213 lb 12.8 oz (97 kg), SpO2 97 %.  LABORATORY DATA: Lab Results  Component Value Date   WBC 9.1 12/05/2016   HGB 13.4 12/05/2016   HCT 38.9 12/05/2016   MCV 85.3 12/05/2016   PLT 214 12/05/2016      Chemistry      Component Value Date/Time   NA 138 12/05/2016 1007   K 5.0 12/05/2016 1007   CL 101 09/09/2013 0715   CL 106 06/10/2012 0900   CO2 25 12/05/2016 1007   BUN 18.7 12/05/2016 1007   CREATININE 1.6 (H) 12/05/2016 1007      Component Value Date/Time   CALCIUM 9.4 12/05/2016 1007   ALKPHOS 77 12/05/2016 1007   AST 23 12/05/2016 1007   ALT 37 12/05/2016 1007   BILITOT 0.66 12/05/2016 1007       RADIOGRAPHIC STUDIES: No results found.  ASSESSMENT/PLAN:  This is a very pleasant 66 years old white male with metastatic non-small cell lung cancer, adenocarcinoma with positive EGFR mutation and he is currently on treatment with Tarceva since 2006. The patient has been tolerating this treatment well with no significant adverse effects. He had chest x-ray performed earlier today. I personally reviewed the x-ray you and I did not see any concerning finding but the final report is still pending. I recommended for the patient to continue his current treatment with Tarceva. I will see him back for follow-up visit in 6 months for reevaluation with repeat CT scan of the chest for restaging of his disease. He was advised to call immediately if he has any concerning symptoms in the interval. All questions were answered. The patient knows to call the clinic with any problems, questions or concerns. We can certainly see the patient much sooner if necessary. I spent 10 minutes counseling the patient face to face. The total time spent in the appointment was  15 minutes.  Disclaimer: This note was dictated with voice recognition software. Similar sounding words can inadvertently be transcribed and may not be corrected upon review.

## 2016-12-07 ENCOUNTER — Telehealth: Payer: Self-pay | Admitting: Medical Oncology

## 2016-12-07 ENCOUNTER — Telehealth: Payer: Self-pay | Admitting: Internal Medicine

## 2016-12-07 NOTE — Telephone Encounter (Signed)
Scheduled appt per sch message and 4/25 los. - Patient is aware of appt date and time - reminder letter sent.

## 2016-12-07 NOTE — Telephone Encounter (Signed)
Wants to get 6 month appts now. Schedule request sent.

## 2016-12-19 ENCOUNTER — Other Ambulatory Visit: Payer: Self-pay | Admitting: Medical Oncology

## 2016-12-19 DIAGNOSIS — Z85118 Personal history of other malignant neoplasm of bronchus and lung: Secondary | ICD-10-CM

## 2016-12-19 MED ORDER — ERLOTINIB HCL 150 MG PO TABS: 150.0000 mg | ORAL_TABLET | Freq: Every day | ORAL | 2 refills | Status: DC

## 2017-02-15 ENCOUNTER — Other Ambulatory Visit: Payer: Self-pay | Admitting: Cardiovascular Disease

## 2017-02-15 DIAGNOSIS — I714 Abdominal aortic aneurysm, without rupture, unspecified: Secondary | ICD-10-CM

## 2017-02-26 ENCOUNTER — Ambulatory Visit (HOSPITAL_COMMUNITY)
Admission: RE | Admit: 2017-02-26 | Discharge: 2017-02-26 | Disposition: A | Discharge status: Home / Self Care | Payer: Medicare Other | Source: Ambulatory Visit | Attending: Cardiology | Admitting: Cardiology

## 2017-02-26 DIAGNOSIS — I714 Abdominal aortic aneurysm, without rupture, unspecified: Secondary | ICD-10-CM

## 2017-03-20 ENCOUNTER — Other Ambulatory Visit: Payer: Self-pay

## 2017-03-20 DIAGNOSIS — I351 Nonrheumatic aortic (valve) insufficiency: Secondary | ICD-10-CM

## 2017-04-03 ENCOUNTER — Telehealth: Payer: Self-pay | Admitting: Internal Medicine

## 2017-04-03 NOTE — Telephone Encounter (Signed)
Call day 10/22-10/24 moved appointment informed patient will dr. Two days later

## 2017-05-24 ENCOUNTER — Other Ambulatory Visit: Payer: Self-pay | Admitting: Medical Oncology

## 2017-05-24 DIAGNOSIS — Z85118 Personal history of other malignant neoplasm of bronchus and lung: Secondary | ICD-10-CM

## 2017-05-24 MED ORDER — ERLOTINIB HCL 150 MG PO TABS: 150.0000 mg | ORAL_TABLET | Freq: Every day | ORAL | 1 refills | Status: DC

## 2017-05-28 ENCOUNTER — Telehealth: Payer: Self-pay | Admitting: Cardiovascular Disease

## 2017-05-28 NOTE — Telephone Encounter (Signed)
New message    Pt is calling with questions about his upcoming appt. He would like to speak to RN. Please call.

## 2017-05-28 NOTE — Telephone Encounter (Signed)
Patient called to ask what test he is having next Wednesday. Informed him he is having an ultrasound of his heart. He was concerned because he does not want to have a stress test at this time. He now understands he is not. He was grateful for call.

## 2017-05-30 ENCOUNTER — Other Ambulatory Visit: Payer: Self-pay

## 2017-05-30 ENCOUNTER — Ambulatory Visit (HOSPITAL_COMMUNITY): Payer: Medicare Other | Attending: Cardiovascular Disease

## 2017-05-30 DIAGNOSIS — E785 Hyperlipidemia, unspecified: Secondary | ICD-10-CM | POA: Insufficient documentation

## 2017-05-30 DIAGNOSIS — E119 Type 2 diabetes mellitus without complications: Secondary | ICD-10-CM | POA: Diagnosis not present

## 2017-05-30 DIAGNOSIS — I351 Nonrheumatic aortic (valve) insufficiency: Secondary | ICD-10-CM

## 2017-05-30 DIAGNOSIS — I2510 Atherosclerotic heart disease of native coronary artery without angina pectoris: Secondary | ICD-10-CM | POA: Insufficient documentation

## 2017-05-30 DIAGNOSIS — I119 Hypertensive heart disease without heart failure: Secondary | ICD-10-CM | POA: Diagnosis not present

## 2017-06-04 ENCOUNTER — Ambulatory Visit (HOSPITAL_COMMUNITY)
Admission: RE | Admit: 2017-06-04 | Discharge: 2017-06-04 | Disposition: A | Discharge status: Home / Self Care | Payer: Medicare Other | Source: Ambulatory Visit | Attending: Internal Medicine | Admitting: Internal Medicine

## 2017-06-04 ENCOUNTER — Other Ambulatory Visit (HOSPITAL_BASED_OUTPATIENT_CLINIC_OR_DEPARTMENT_OTHER): Payer: Medicare Other

## 2017-06-04 ENCOUNTER — Ambulatory Visit: Payer: Medicare Other | Admitting: Internal Medicine

## 2017-06-04 ENCOUNTER — Other Ambulatory Visit: Payer: Medicare Other

## 2017-06-04 DIAGNOSIS — C3412 Malignant neoplasm of upper lobe, left bronchus or lung: Secondary | ICD-10-CM

## 2017-06-04 DIAGNOSIS — Z9889 Other specified postprocedural states: Secondary | ICD-10-CM | POA: Insufficient documentation

## 2017-06-04 LAB — CBC WITH DIFFERENTIAL/PLATELET
BASO%: 0.4 % (ref 0.0–2.0)
Basophils Absolute: 0 10*3/uL (ref 0.0–0.1)
EOS%: 2.3 % (ref 0.0–7.0)
Eosinophils Absolute: 0.2 10*3/uL (ref 0.0–0.5)
HCT: 38.1 % — ABNORMAL LOW (ref 38.4–49.9)
HGB: 12.9 g/dL — ABNORMAL LOW (ref 13.0–17.1)
LYMPH%: 27.6 % (ref 14.0–49.0)
MCH: 29.6 pg (ref 27.2–33.4)
MCHC: 33.9 g/dL (ref 32.0–36.0)
MCV: 87.4 fL (ref 79.3–98.0)
MONO#: 0.9 10*3/uL (ref 0.1–0.9)
MONO%: 11.2 % (ref 0.0–14.0)
NEUT%: 58.5 % (ref 39.0–75.0)
NEUTROS ABS: 4.5 10*3/uL (ref 1.5–6.5)
Platelets: 218 10*3/uL (ref 140–400)
RBC: 4.36 10*6/uL (ref 4.20–5.82)
RDW: 14.1 % (ref 11.0–14.6)
WBC: 7.7 10*3/uL (ref 4.0–10.3)
lymph#: 2.1 10*3/uL (ref 0.9–3.3)

## 2017-06-04 LAB — COMPREHENSIVE METABOLIC PANEL WITH GFR
ALT: 35 U/L (ref 0–55)
AST: 24 U/L (ref 5–34)
Albumin: 3.7 g/dL (ref 3.5–5.0)
Alkaline Phosphatase: 84 U/L (ref 40–150)
Anion Gap: 8 meq/L (ref 3–11)
BUN: 14.2 mg/dL (ref 7.0–26.0)
CO2: 26 meq/L (ref 22–29)
Calcium: 9.6 mg/dL (ref 8.4–10.4)
Chloride: 105 meq/L (ref 98–109)
Creatinine: 1.5 mg/dL — ABNORMAL HIGH (ref 0.7–1.3)
EGFR: 46 ml/min/1.73 m2 — ABNORMAL LOW
Glucose: 152 mg/dL — ABNORMAL HIGH (ref 70–140)
Potassium: 5.1 meq/L (ref 3.5–5.1)
Sodium: 139 meq/L (ref 136–145)
Total Bilirubin: 0.82 mg/dL (ref 0.20–1.20)
Total Protein: 7 g/dL (ref 6.4–8.3)

## 2017-06-06 ENCOUNTER — Encounter: Payer: Self-pay | Admitting: Internal Medicine

## 2017-06-06 ENCOUNTER — Ambulatory Visit (HOSPITAL_BASED_OUTPATIENT_CLINIC_OR_DEPARTMENT_OTHER): Payer: Medicare Other | Admitting: Internal Medicine

## 2017-06-06 VITALS — BP 172/83 | HR 92 | Temp 98.3°F | Resp 20 | Ht 73.0 in | Wt 216.0 lb

## 2017-06-06 DIAGNOSIS — C3412 Malignant neoplasm of upper lobe, left bronchus or lung: Secondary | ICD-10-CM | POA: Diagnosis not present

## 2017-06-06 NOTE — Progress Notes (Signed)
[place] [phone]   [phone]  OFFICE PROGRESS NOTE  [name], MD [address], [address] [place] [address]  DIAGNOSIS: Metastatic non-small cell lung cancer, adenocarcinoma initially diagnosed in December of 2004  PRIOR THERAPY: 1) status post course of concurrent chemoradiation under the care of Dr. Truddie Coco. 2) status post left upper lobe wedge resection on 01/10/2005 for recurrent disease in the left lung.  CURRENT THERAPY: Tarceva 150 mg by mouth daily started in 2006.  INTERVAL HISTORY: [name] 66 y.o. male returns to the clinic today for follow-up visit. The patient is feeling fine today with no specific complaints. He denied having any recent chest pain, shortness of breath, cough or hemoptysis. He denied having any nausea or vomiting. He denied having any skin rash or diarrhea. The patient denied having any significant weight loss or night sweats. He continues to tolerate his treatment with Tarceva fairly well. He had repeat CT scan of the chest performed recently and he is here for evaluation and discussion of his scan results.  MEDICAL HISTORY: Past Medical History:  Diagnosis Date  . AAA (abdominal aortic aneurysm) (HCC)    a. 3.4 x 3.5 on scan 02/28/13.  . Allergic reaction to contrast dye   . Atherosclerosis   . CAD (coronary artery disease)    a. stenting of RCA/LCx 2004. b. [place] 08/2013:  s/p DES to LCx for severe ISR.  . Diabetes mellitus   . Diverticulosis   . Emphysema lung ([place])   . Emphysema of lung ([place])   . Encounter for therapeutic drug monitoring 06/06/2016  . Hemorrhoids   . Hepatic steatosis   . HTN (hypertension)   . Hyperlipidemia   . Kidney stone   . Lung cancer ([place]) dx'd 2004   chemo/xrt comp; tarceva ongoing    ALLERGIES:  is allergic to iohexol; morphine and related; and other.  MEDICATIONS:  Current Outpatient Prescriptions  Medication Sig Dispense Refill  . amLODipine  (NORVASC) 2.5 MG tablet Take 2.5 mg by mouth daily.  5  . aspirin 81 MG chewable tablet Chew 81 mg by mouth daily.    Marland Kitchen atorvastatin (LIPITOR) 40 MG tablet Take 40 mg by mouth daily.    . clopidogrel (PLAVIX) 75 MG tablet TAKE 1 TABLET (75 MG TOTAL) BY MOUTH DAILY. 90 tablet 2  . erlotinib (TARCEVA) 150 MG tablet Take 1 tablet (150 mg total) by mouth daily. Take on an empty stomach 1 hour before meals or 2 hours after. 30 tablet 1  . glimepiride (AMARYL) 4 MG tablet Take 4 mg by mouth 2 (two) times daily.    . isosorbide mononitrate (IMDUR) 30 MG 24 hr tablet TAKE 1 TABLET BY MOUTH ONCE DAILY 90 tablet 1  . Loperamide HCl (IMODIUM A-D) 1 MG/7.5ML LIQD Take 7.5 mLs by mouth as directed.     Marland Kitchen losartan (COZAAR) 100 MG tablet TAKE ONE TABLET BY MOUTH EVERYDAY  5  . metFORMIN (GLUCOPHAGE) 850 MG tablet Take 1 tablet (850 mg total) by mouth 2 (two) times daily with a meal.    . metoprolol (LOPRESSOR) 100 MG tablet Take 100 mg by mouth daily. Take one tablet by mouth daily with or following a meal  4  . nitroGLYCERIN (NITROSTAT) 0.4 MG SL tablet Place 1 tablet (0.4 mg total) under the tongue every 5 (five) minutes as needed for chest pain (up to 3 doses). (Patient not taking: Reported on 06/06/2016) 25 tablet  3  . omeprazole (PRILOSEC) 20 MG capsule Take 20 mg by mouth 2 (two) times daily.  5   No current facility-administered medications for this visit.     REVIEW OF SYSTEMS:  Constitutional: negative Eyes: negative Ears, nose, mouth, throat, and face: negative Respiratory: negative Cardiovascular: negative Gastrointestinal: negative Genitourinary:negative Integument/breast: negative Hematologic/lymphatic: negative Musculoskeletal:negative Neurological: negative Behavioral/Psych: negative Endocrine: negative Allergic/Immunologic: negative   PHYSICAL EXAMINATION: General appearance: alert, cooperative and no distress Head: Normocephalic, without obvious abnormality, atraumatic Neck: no  adenopathy Lymph nodes: Cervical, supraclavicular, and axillary nodes normal. Resp: clear to auscultation bilaterally Back: symmetric, no curvature. ROM normal. No CVA tenderness. Cardio: regular rate and rhythm, S1, S2 normal, no murmur, click, rub or gallop GI: soft, non-tender; bowel sounds normal; no masses,  no organomegaly Extremities: extremities normal, atraumatic, no cyanosis or edema Neurologic: Alert and oriented X 3, normal strength and tone. Normal symmetric reflexes. Normal coordination and gait  ECOG PERFORMANCE STATUS: 0 - Asymptomatic  Blood pressure (!) 172/83, pulse 92, temperature 98.3 F (36.8 C), temperature source Oral, resp. rate 20, height _0  (1.854 m), weight 216 lb (98 kg), SpO2 98 %.  LABORATORY DATA: Lab Results  Component Value Date   WBC 7.7 06/04/2017   HGB 12.9 (L) 06/04/2017   HCT 38.1 (L) 06/04/2017   MCV 87.4 06/04/2017   PLT 218 06/04/2017      Chemistry      Component Value Date/Time   NA 139 06/04/2017 0948   K 5.1 06/04/2017 0948   CL 101 09/09/2013 0715   CL 106 06/10/2012 0900   CO2 26 06/04/2017 0948   BUN 14.2 06/04/2017 0948   CREATININE 1.5 (H) 06/04/2017 0948      Component Value Date/Time   CALCIUM 9.6 06/04/2017 0948   ALKPHOS 84 06/04/2017 0948   AST 24 06/04/2017 0948   ALT 35 06/04/2017 0948   BILITOT 0.82 06/04/2017 0948       RADIOGRAPHIC STUDIES: Ct Chest Wo Contrast  Result Date: 06/04/2017 CLINICAL DATA:  Bilateral lung ca dx'd 2004. H/o heart stents, LUL lobectomy. Chemo and XRT completed. Ongoing immunotherapy. EXAM: CT CHEST WITHOUT CONTRAST TECHNIQUE: Multidetector CT imaging of the chest was performed following the standard protocol without IV contrast. COMPARISON:  Chest CT 05/30/2016 FINDINGS: Cardiovascular: Coronary artery calcification and aortic atherosclerotic calcification. Mediastinum/Nodes: No axillary or supraclavicular adenopathy. No mediastinal hilar adenopathy. No pericardial fluid.  Esophagus normal. Lungs/Pleura: Postsurgical change in the LEFT upper lobe consistent with lobectomy. Nodularity along the resection scar measures 10 mm not changed from 10 mm. No new pulmonary nodularity. RIGHT lung is clear. Upper Abdomen: Limited view of the liver, kidneys, pancreas are unremarkable. Normal adrenal glands. Musculoskeletal: No evidence of skeletal metastasis. Expansile lesion in the RIGHT seventh and eighth rib stable over multiple comparison exams. IMPRESSION: 1. Stable postsurgical change in the LEFT upper lobe. No evidence of local recurrence. 2. No evidence of metastatic disease. Electronically Signed   By: Suzy Bouchard M.D.   On: 06/04/2017 10:58    ASSESSMENT/PLAN:  This is a very pleasant 66 years old white male with metastatic non-small cell lung cancer, adenocarcinoma with positive EGFR mutation and he is currently on treatment with Tarceva since 2006. The patient continues to tolerate his treatment well with no significant adverse effects. He had repeat CT scan of the chest performed recently. I personally and independently reviewed the scans and discuss the results with the patient today. His CT scan showed no evidence for disease progression. I  recommended for the patient to continue his treatment with Tarceva with the same dose. For hypertension, the patient did not sleep well last night and he was drinking a lot of coffee to be able to come to the clinic today. He also did not take his blood pressure medication early this morning as prescribed. I recommended for him to go back home and take his medications as prescribed. He was also advised to monitor it closely and report his primary care physician for adjustment is needed. Regarding acid reflux, I recommended for the patient to stop taking omeprazole and to consider treatment with Pepcid or Zantac 2 hours after Tarceva and 10 hours before the next dose. I will see him back for follow-up visit in 6 months for  reevaluation with repeat blood work and chest x-ray. The patient was advised to call immediately if he has any concerning symptoms in the interval. All questions were answered. The patient knows to call the clinic with any problems, questions or concerns. We can certainly see the patient much sooner if necessary.  Disclaimer: This note was dictated with voice recognition software. Similar sounding words can inadvertently be transcribed and may not be corrected upon review.

## 2017-06-07 ENCOUNTER — Ambulatory Visit (INDEPENDENT_AMBULATORY_CARE_PROVIDER_SITE_OTHER): Payer: Medicare Other | Admitting: Cardiovascular Disease

## 2017-06-07 ENCOUNTER — Encounter: Payer: Self-pay | Admitting: Cardiovascular Disease

## 2017-06-07 VITALS — BP 130/78 | HR 87 | Ht 73.0 in | Wt 212.6 lb

## 2017-06-07 DIAGNOSIS — I2510 Atherosclerotic heart disease of native coronary artery without angina pectoris: Secondary | ICD-10-CM

## 2017-06-07 DIAGNOSIS — I771 Stricture of artery: Secondary | ICD-10-CM

## 2017-06-07 DIAGNOSIS — I714 Abdominal aortic aneurysm, without rupture, unspecified: Secondary | ICD-10-CM

## 2017-06-07 DIAGNOSIS — I10 Essential (primary) hypertension: Secondary | ICD-10-CM | POA: Diagnosis not present

## 2017-06-07 DIAGNOSIS — E785 Hyperlipidemia, unspecified: Secondary | ICD-10-CM | POA: Diagnosis not present

## 2017-06-07 NOTE — Patient Instructions (Addendum)
Medication Instructions:  Your provider recommends that you continue on your current medications as directed. Please refer to the Current Medication list given to you today.    Labwork: None  Testing/Procedures: Dr. Burt Knack recommends you have an abdominal duplex to look at your aorta next July.  Dr. Burt Knack recommends you have an ultrasound of your legs next July as well.  Follow-Up: Your provider wants you to follow-up in: 1 year with Dr. Burt Knack You will receive a reminder letter in the mail two months in advance. If you don't receive a letter, please call our office to schedule the follow-up appointment.    Any Other Special Instructions Will Be Listed Below (If Applicable).     If you need a refill on your cardiac medications before your next appointment, please call your pharmacy.

## 2017-06-07 NOTE — Progress Notes (Signed)
Cardiology Office Note Date:  06/07/2017   ID:  LOVEL SUAZO [name], DOB [birth year], MRN [id number]  PCP:  [name], MD  Cardiologist:  Sherren Mocha, MD    Chief Complaint  Patient presents with  . Follow-up     History of Present Illness: [name] is a 66 y.o. male who presents for follow-up evaluation.   The patient has coronary artery disease and he has undergone coronary stenting, initially in 2004 when he underwent a PCI of the right coronary artery and left circumflex.  In 2015 he presented with progressive anginal symptoms and was found to have severe in-stent restenosis in the left circumflex.  He underwent repeat stenting with a drug-eluting stent platform at that time.  The patient is here alone today.  He is doing well from a cardiac perspective.  He denies any recent problems with chest pain, chest pressure, or heart palpitations.  He is compliant with his medications.  He has had no problems with leg swelling, orthopnea, or PND.  He denies any symptoms of leg claudication.   Past Medical History:  Diagnosis Date  . AAA (abdominal aortic aneurysm) (HCC)    a. 3.4 x 3.5 on scan 02/28/13.  . Allergic reaction to contrast dye   . Atherosclerosis   . CAD (coronary artery disease)    a. stenting of RCA/LCx 2004. b. [place] 08/2013:  s/p DES to LCx for severe ISR.  . Diabetes mellitus   . Diverticulosis   . Emphysema lung ([place])   . Emphysema of lung ([place])   . Encounter for therapeutic drug monitoring 06/06/2016  . Hemorrhoids   . Hepatic steatosis   . HTN (hypertension)   . Hyperlipidemia   . Kidney stone   . Lung cancer ([place]) dx'd 2004   chemo/xrt comp; tarceva ongoing    Past Surgical History:  Procedure Laterality Date  . ANGIOPLASTY / STENTING FEMORAL  2015   2004-groin  . LEFT HEART CATHETERIZATION WITH CORONARY ANGIOGRAM N/A 09/08/2013   Procedure: LEFT HEART CATHETERIZATION WITH CORONARY ANGIOGRAM;  Surgeon: Blane Ohara, MD;  Location[facility] CATH  LAB;  Service: Cardiovascular;  Laterality: N/A;  . left partial lobectomy    . WISDOM TOOTH EXTRACTION      Current Outpatient Prescriptions  Medication Sig Dispense Refill  . amLODipine (NORVASC) 2.5 MG tablet Take 2.5 mg by mouth daily.  5  . aspirin 81 MG chewable tablet Chew 81 mg by mouth daily.    Marland Kitchen atorvastatin (LIPITOR) 40 MG tablet Take 40 mg by mouth daily.    . clopidogrel (PLAVIX) 75 MG tablet TAKE 1 TABLET (75 MG TOTAL) BY MOUTH DAILY. 90 tablet 2  . erlotinib (TARCEVA) 150 MG tablet Take 1 tablet (150 mg total) by mouth daily. Take on an empty stomach 1 hour before meals or 2 hours after. 30 tablet 1  . glimepiride (AMARYL) 4 MG tablet Take 4 mg by mouth 2 (two) times daily.    . isosorbide mononitrate (IMDUR) 30 MG 24 hr tablet TAKE 1 TABLET BY MOUTH ONCE DAILY 90 tablet 1  . Loperamide HCl (IMODIUM A-D) 1 MG/7.5ML LIQD Take 7.5 mLs by mouth as directed.     Marland Kitchen losartan (COZAAR) 100 MG tablet TAKE ONE TABLET BY MOUTH EVERYDAY  5  . metFORMIN (GLUCOPHAGE) 850 MG tablet Take 1 tablet (850 mg total) by mouth 2 (two) times daily with a meal.    . metoprolol (LOPRESSOR) 100 MG tablet Take 100 mg by  mouth daily. Take one tablet by mouth daily with or following a meal  4  . nitroGLYCERIN (NITROSTAT) 0.4 MG SL tablet Place 1 tablet (0.4 mg total) under the tongue every 5 (five) minutes as needed for chest pain (up to 3 doses). 25 tablet 3  . omeprazole (PRILOSEC) 20 MG capsule Take 20 mg by mouth 2 (two) times daily.  5   No current facility-administered medications for this visit.     Allergies:   Iohexol; Morphine and related; and Other   Social History:  The patient  reports that he quit smoking about 14 years ago. He has never used smokeless tobacco. He reports that he does not drink alcohol or use drugs.   Family History:  The patient's family history includes Diabetes in his sister; Heart disease in his sister; Ovarian cancer in his sister.    ROS:  Please see the history  of present illness.  All other systems are reviewed and negative.    PHYSICAL EXAM: VS:  BP 130/78   Pulse 87   Ht 6\' 1"  (1.854 m)   Wt 212 lb 9.6 oz (96.4 kg)   BMI 28.05 kg/m  , BMI Body mass index is 28.05 kg/m. GEN: Well nourished, well developed, in no acute distress  HEENT: normal  Neck: no JVD, no masses. No carotid bruits Cardiac: RRR without murmur or gallop                Respiratory:  clear to auscultation bilaterally, normal work of breathing GI: soft, nontender, nondistended, + BS MS: no deformity or atrophy  Ext: no pretibial edema, femoral pulses 2+= bilaterallypedal pulses 2+= bilaterally Skin: warm and dry, no rash Neuro:  Strength and sensation are intact Psych: euthymic mood, full affect  EKG:  EKG is ordered today. The ekg ordered today shows NSR 87 bpm, within normal limits  Recent Labs: 06/04/2017: ALT 35; BUN 14.2; Creatinine 1.5; HGB 12.9; Platelets 218; Potassium 5.1; Sodium 139   Lipid Panel  No results found for: CHOL, TRIG, HDL, CHOLHDL, VLDL, LDLCALC, LDLDIRECT    Wt Readings from Last 3 Encounters:  06/07/17 212 lb 9.6 oz (96.4 kg)  06/06/17 216 lb (98 kg)  12/05/16 213 lb 12.8 oz (97 kg)     Cardiac Studies Reviewed: Echo 05-30-2017: Study Conclusions  - Left ventricle: The cavity size was normal. Wall thickness was   increased in a pattern of mild LVH. Systolic function was mildly   reduced. The estimated ejection fraction was in the range of 45%   to 50%. Diffuse hypokinesis. Doppler parameters are consistent   with abnormal left ventricular relaxation (grade 1 diastolic   dysfunction). Doppler parameters are consistent with   indeterminate ventricular filling pressure. - Aortic valve: Transvalvular velocity was within the normal range.   There was no stenosis. There was moderate regurgitation.   Regurgitation pressure half-time: 472 ms. - Mitral valve: Transvalvular velocity was within the normal range.   There was no evidence  for stenosis. There was no regurgitation. - Right ventricle: The cavity size was normal. Wall thickness was   normal. Systolic function was normal. - Atrial septum: No defect or patent foramen ovale was identified. - Tricuspid valve: There was no regurgitation.  AAA Duplex: Impressions Study is technically challenging secondary to abdominal girth. Duplex imaging of the abdominal aorta, with color Doppler, reveals stable dimensions of the infrarenal fusiform dilatation in the distal aorta, measuring 3.6 cm x 3.8 cm. There is atherosclerosis throughout the aorta  and bilateral iliac arteries. Bilateral common and external iliac artery velocities are elevated. Right common iliac artery velocities have increased and left common iliac artery velocities have decreased compared to prior exam. Right external iliac artery velocities are stable and left external iliac artery velocities have increased. Patent IVC. Technologist Notes Technically challenging study. Stable infrarenal fusiform AAA in the distal aorta, measuring 3.6 cm x 3.8 cm.  Aorto-iliac atherosclerosis. Progression of right common iliac artery disease with higher velocities compared to prior exam, remains in >50% range. Stable right external iliac artery stenosis. Decrease in left common iliac artery velocities, remains in >50% range of stenosis. Progression of left external iliac artery disease with higher velocities compared to prior exam, now in >50% range. Patent IVC. F/U 1 year.  ASSESSMENT AND PLAN: 1.  Coronary artery disease, native vessel, with angina: Medications reviewed and will be continued without change. Continue amlodipine, metoprolol, and isosorbide for antianginal Rx. Currently no cardiac limitations or active chest pain.   2. AAA: Most recent duplex is reviewed with maximal dimensions of his aorta 3.6 x 3.8 cm.  Continue yearly surveillance.  3.  Bilateral iliac artery stenosis: Identified on his duplex scan  with severe common iliac disease bilaterally.  His femoral and pedal pulses are easily palpable bilaterally.  We will check ABIs with his next vascular study next year.  He does not appear to have symptoms of claudication.  4.  Hyperlipidemia: Treated with atorvastatin 40 mg daily.  5.  Hypertension: Blood pressure is controlled on losartan, isosorbide, and amlodipine and metoprolol  6.  Type 2 diabetes: Lifestyle modification is discussed.  He takes oral hypoglycemic agents.  Current medicines are reviewed with the patient today.  The patient does not have concerns regarding medicines.  Labs/ tests ordered today include:  No orders of the defined types were placed in this encounter.   Disposition:   FU one year  Signed, Sherren Mocha, MD  06/07/2017 3:30 PM    [address], [place], [place]  [address] Phone: [phone]; Fax: [phone]

## 2017-08-02 ENCOUNTER — Other Ambulatory Visit: Payer: Self-pay | Admitting: Medical Oncology

## 2017-08-02 DIAGNOSIS — Z85118 Personal history of other malignant neoplasm of bronchus and lung: Secondary | ICD-10-CM

## 2017-08-02 MED ORDER — ERLOTINIB HCL 150 MG PO TABS: 150.0000 mg | ORAL_TABLET | Freq: Every day | ORAL | 2 refills | Status: DC

## 2017-08-10 ENCOUNTER — Other Ambulatory Visit: Payer: Self-pay | Admitting: *Deleted

## 2017-08-10 NOTE — Telephone Encounter (Signed)
Rx for Tarceva refaxed to Pharmacy 12/26

## 2017-10-30 ENCOUNTER — Other Ambulatory Visit: Payer: Self-pay | Admitting: Medical Oncology

## 2017-10-30 DIAGNOSIS — Z85118 Personal history of other malignant neoplasm of bronchus and lung: Secondary | ICD-10-CM

## 2017-10-30 MED ORDER — ERLOTINIB HCL 150 MG PO TABS: 150.0000 mg | ORAL_TABLET | Freq: Every day | ORAL | 2 refills | Status: DC

## 2017-11-22 ENCOUNTER — Telehealth: Payer: Self-pay | Admitting: Internal Medicine

## 2017-11-22 NOTE — Telephone Encounter (Signed)
Tried to call regarding voicemail  °

## 2017-12-05 ENCOUNTER — Inpatient Hospital Stay: Payer: Medicare Other | Attending: Internal Medicine

## 2017-12-05 ENCOUNTER — Ambulatory Visit (HOSPITAL_COMMUNITY)
Admission: RE | Admit: 2017-12-05 | Discharge: 2017-12-05 | Disposition: A | Discharge status: Home / Self Care | Payer: Medicare Other | Source: Ambulatory Visit | Attending: Internal Medicine | Admitting: Internal Medicine

## 2017-12-05 DIAGNOSIS — Z01810 Encounter for preprocedural cardiovascular examination: Secondary | ICD-10-CM | POA: Insufficient documentation

## 2017-12-05 DIAGNOSIS — C3412 Malignant neoplasm of upper lobe, left bronchus or lung: Secondary | ICD-10-CM | POA: Insufficient documentation

## 2017-12-05 LAB — CBC WITH DIFFERENTIAL/PLATELET
Basophils Absolute: 0.1 10*3/uL (ref 0.0–0.1)
Basophils Relative: 1 %
EOS ABS: 0.2 10*3/uL (ref 0.0–0.5)
Eosinophils Relative: 3 %
HCT: 36.3 % — ABNORMAL LOW (ref 38.4–49.9)
HEMOGLOBIN: 12.6 g/dL — AB (ref 13.0–17.1)
LYMPHS ABS: 2.5 10*3/uL (ref 0.9–3.3)
Lymphocytes Relative: 34 %
MCH: 31.2 pg (ref 27.2–33.4)
MCHC: 34.7 g/dL (ref 32.0–36.0)
MCV: 89.8 fL (ref 79.3–98.0)
Monocytes Absolute: 0.8 10*3/uL (ref 0.1–0.9)
Monocytes Relative: 10 %
NEUTROS PCT: 52 %
Neutro Abs: 3.9 10*3/uL (ref 1.5–6.5)
Platelets: 241 10*3/uL (ref 140–400)
RBC: 4.04 MIL/uL — AB (ref 4.20–5.82)
RDW: 15.9 % — ABNORMAL HIGH (ref 11.0–14.6)
WBC: 7.4 10*3/uL (ref 4.0–10.3)

## 2017-12-05 LAB — COMPREHENSIVE METABOLIC PANEL
ALT: 19 U/L (ref 0–55)
AST: 16 U/L (ref 5–34)
Albumin: 3.7 g/dL (ref 3.5–5.0)
Alkaline Phosphatase: 77 U/L (ref 40–150)
Anion gap: 11 (ref 3–11)
BUN: 18 mg/dL (ref 7–26)
CALCIUM: 9.4 mg/dL (ref 8.4–10.4)
CO2: 21 mmol/L — AB (ref 22–29)
CREATININE: 1.94 mg/dL — AB (ref 0.70–1.30)
Chloride: 107 mmol/L (ref 98–109)
GFR calc non Af Amer: 34 mL/min — ABNORMAL LOW (ref >60)
GFR, EST AFRICAN AMERICAN: 39 mL/min — AB (ref >60)
GLUCOSE: 160 mg/dL — AB (ref 70–140)
Potassium: 4.3 mmol/L (ref 3.5–5.1)
SODIUM: 139 mmol/L (ref 136–145)
Total Bilirubin: 0.6 mg/dL (ref 0.2–1.2)
Total Protein: 6.8 g/dL (ref 6.4–8.3)

## 2017-12-12 ENCOUNTER — Encounter: Payer: Self-pay | Admitting: Internal Medicine

## 2017-12-12 ENCOUNTER — Inpatient Hospital Stay: Payer: Medicare Other | Attending: Internal Medicine | Admitting: Internal Medicine

## 2017-12-12 ENCOUNTER — Telehealth: Payer: Self-pay | Admitting: Internal Medicine

## 2017-12-12 DIAGNOSIS — I10 Essential (primary) hypertension: Secondary | ICD-10-CM | POA: Insufficient documentation

## 2017-12-12 DIAGNOSIS — Z9221 Personal history of antineoplastic chemotherapy: Secondary | ICD-10-CM | POA: Diagnosis not present

## 2017-12-12 DIAGNOSIS — E119 Type 2 diabetes mellitus without complications: Secondary | ICD-10-CM | POA: Diagnosis not present

## 2017-12-12 DIAGNOSIS — C3412 Malignant neoplasm of upper lobe, left bronchus or lung: Secondary | ICD-10-CM | POA: Diagnosis not present

## 2017-12-12 DIAGNOSIS — C3490 Malignant neoplasm of unspecified part of unspecified bronchus or lung: Secondary | ICD-10-CM

## 2017-12-12 DIAGNOSIS — Z923 Personal history of irradiation: Secondary | ICD-10-CM | POA: Insufficient documentation

## 2017-12-12 NOTE — Progress Notes (Signed)
[place] [phone]   [phone]  OFFICE PROGRESS NOTE  [name], MD [address], [address] [place] [address]  DIAGNOSIS: Metastatic non-small cell lung cancer, adenocarcinoma initially diagnosed in December of 2004  PRIOR THERAPY: 1) status post course of concurrent chemoradiation under the care of Dr. Truddie Coco. 2) status post left upper lobe wedge resection on 01/10/2005 for recurrent disease in the left lung.  CURRENT THERAPY: Tarceva 150 mg by mouth daily started in 2006.  INTERVAL HISTORY: [name] 67 y.o. male returns to the clinic today for 6 months follow-up visit.  The patient is feeling fine today with no concerning complaints.  He continues to tolerate his treatment with Tarceva fairly well.  He has no chest pain, shortness breath, cough or hemoptysis.  He denied having any fever or chills.  He has no nausea, vomiting, diarrhea or constipation.  He is here today for evaluation with repeat blood work and chest x-ray.  MEDICAL HISTORY: Past Medical History:  Diagnosis Date  . AAA (abdominal aortic aneurysm) (HCC)    a. 3.4 x 3.5 on scan 02/28/13.  . Allergic reaction to contrast dye   . Atherosclerosis   . CAD (coronary artery disease)    a. stenting of RCA/LCx 2004. b. [place] 08/2013:  s/p DES to LCx for severe ISR.  . Diabetes mellitus   . Diverticulosis   . Emphysema lung ([place])   . Emphysema of lung ([place])   . Encounter for therapeutic drug monitoring 06/06/2016  . Hemorrhoids   . Hepatic steatosis   . HTN (hypertension)   . Hyperlipidemia   . Kidney stone   . Lung cancer ([place]) dx'd 2004   chemo/xrt comp; tarceva ongoing    ALLERGIES:  is allergic to iohexol; morphine and related; and other.  MEDICATIONS:  Current Outpatient Medications  Medication Sig Dispense Refill  . amLODipine (NORVASC) 2.5 MG tablet Take 2.5 mg by mouth daily.  5  . aspirin 81 MG chewable tablet Chew 81 mg by mouth daily.      Marland Kitchen atorvastatin (LIPITOR) 40 MG tablet Take 40 mg by mouth daily.    . clopidogrel (PLAVIX) 75 MG tablet TAKE 1 TABLET (75 MG TOTAL) BY MOUTH DAILY. 90 tablet 2  . erlotinib (TARCEVA) 150 MG tablet Take 1 tablet (150 mg total) by mouth daily. Take on an empty stomach 1 hour before meals or 2 hours after. 30 tablet 2  . glimepiride (AMARYL) 4 MG tablet Take 4 mg by mouth 2 (two) times daily.    . isosorbide mononitrate (IMDUR) 30 MG 24 hr tablet TAKE 1 TABLET BY MOUTH ONCE DAILY 90 tablet 1  . Loperamide HCl (IMODIUM A-D) 1 MG/7.5ML LIQD Take 7.5 mLs by mouth as directed.     Marland Kitchen losartan (COZAAR) 100 MG tablet TAKE ONE TABLET BY MOUTH EVERYDAY  5  . metFORMIN (GLUCOPHAGE) 850 MG tablet Take 1 tablet (850 mg total) by mouth 2 (two) times daily with a meal.    . metoprolol (LOPRESSOR) 100 MG tablet Take 100 mg by mouth daily. Take one tablet by mouth daily with or following a meal  4  . nitroGLYCERIN (NITROSTAT) 0.4 MG SL tablet Place 1 tablet (0.4 mg total) under the tongue every 5 (five) minutes as needed for chest pain (up to 3 doses). 25 tablet 3  . omeprazole (PRILOSEC) 20 MG capsule Take 20 mg by mouth 2 (two) times daily.  5  No current facility-administered medications for this visit.     REVIEW OF SYSTEMS:  A comprehensive review of systems was negative.   PHYSICAL EXAMINATION: General appearance: alert, cooperative and no distress Head: Normocephalic, without obvious abnormality, atraumatic Neck: no adenopathy Lymph nodes: Cervical, supraclavicular, and axillary nodes normal. Resp: clear to auscultation bilaterally Back: symmetric, no curvature. ROM normal. No CVA tenderness. Cardio: regular rate and rhythm, S1, S2 normal, no murmur, click, rub or gallop GI: soft, non-tender; bowel sounds normal; no masses,  no organomegaly Extremities: extremities normal, atraumatic, no cyanosis or edema  ECOG PERFORMANCE STATUS: 0 - Asymptomatic  Blood pressure 118/61, pulse 100, temperature  97.8 F (36.6 C), temperature source Oral, resp. rate 17, height 6' 1"  (1.854 m), weight 217 lb 1.6 oz (98.5 kg), SpO2 100 %.  LABORATORY DATA: Lab Results  Component Value Date   WBC 7.4 12/05/2017   HGB 12.6 (L) 12/05/2017   HCT 36.3 (L) 12/05/2017   MCV 89.8 12/05/2017   PLT 241 12/05/2017      Chemistry      Component Value Date/Time   NA 139 12/05/2017 0752   NA 139 06/04/2017 0948   K 4.3 12/05/2017 0752   K 5.1 06/04/2017 0948   CL 107 12/05/2017 0752   CL 106 06/10/2012 0900   CO2 21 (L) 12/05/2017 0752   CO2 26 06/04/2017 0948   BUN 18 12/05/2017 0752   BUN 14.2 06/04/2017 0948   CREATININE 1.94 (H) 12/05/2017 0752   CREATININE 1.5 (H) 06/04/2017 0948      Component Value Date/Time   CALCIUM 9.4 12/05/2017 0752   CALCIUM 9.6 06/04/2017 0948   ALKPHOS 77 12/05/2017 0752   ALKPHOS 84 06/04/2017 0948   AST 16 12/05/2017 0752   AST 24 06/04/2017 0948   ALT 19 12/05/2017 0752   ALT 35 06/04/2017 0948   BILITOT 0.6 12/05/2017 0752   BILITOT 0.82 06/04/2017 0948       RADIOGRAPHIC STUDIES: Dg Chest 1 View  Result Date: 12/05/2017 CLINICAL DATA:  Malignant neoplasm of the left upper lobe. EXAM: CHEST  1 VIEW COMPARISON:  12/05/2016 FINDINGS: Postoperative left upper lobe with nodular density along the lower chain sutures, stable from prior. Normal heart size and stable mediastinal contours. Coronary stenting. Atherosclerotic calcification. Two broad and sclerotic right ribs are stable from prior. IMPRESSION: Stable postoperative chest. Electronically Signed   By: Monte Fantasia M.D.   On: 12/05/2017 13:15    ASSESSMENT/PLAN:  This is a very pleasant 67 years old white male with metastatic non-small cell lung cancer, adenocarcinoma with positive EGFR mutation and he is currently on treatment with Tarceva since 2006. He has been tolerating this treatment well with no concerning complaints.  The recent chest x-ray showed no concerning findings. I recommended for  him to continue his current treatment with Tarceva with the same dose.  I will see him back for follow-up visit in 6 months for evaluation with repeat blood work as well as CT scan of the chest. He was advised to call immediately if he has any concerning symptoms in the interval. All questions were answered. The patient knows to call the clinic with any problems, questions or concerns. We can certainly see the patient much sooner if necessary.  Disclaimer: This note was dictated with voice recognition software. Similar sounding words can inadvertently be transcribed and may not be corrected upon review.

## 2017-12-12 NOTE — Telephone Encounter (Signed)
Scheduled appt per 5/1 los - sent reminder letter in the mail. Lab / ct and follow up in 6 months.

## 2017-12-25 ENCOUNTER — Other Ambulatory Visit: Payer: Self-pay | Admitting: Medical Oncology

## 2017-12-25 DIAGNOSIS — Z85118 Personal history of other malignant neoplasm of bronchus and lung: Secondary | ICD-10-CM

## 2017-12-25 MED ORDER — ERLOTINIB HCL 150 MG PO TABS
150.0000 mg | ORAL_TABLET | Freq: Every day | ORAL | 2 refills | Status: DC
Start: 2017-12-25 — End: 2018-01-28

## 2018-01-28 ENCOUNTER — Other Ambulatory Visit: Payer: Self-pay | Admitting: Medical Oncology

## 2018-01-28 DIAGNOSIS — Z85118 Personal history of other malignant neoplasm of bronchus and lung: Secondary | ICD-10-CM

## 2018-01-28 MED ORDER — ERLOTINIB HCL 150 MG PO TABS: 150.0000 mg | ORAL_TABLET | Freq: Every day | ORAL | 3 refills | Status: DC

## 2018-01-30 ENCOUNTER — Other Ambulatory Visit: Payer: Self-pay | Admitting: *Deleted

## 2018-01-30 DIAGNOSIS — Z85118 Personal history of other malignant neoplasm of bronchus and lung: Secondary | ICD-10-CM

## 2018-01-30 MED ORDER — ERLOTINIB HCL 150 MG PO TABS: 150.0000 mg | ORAL_TABLET | Freq: Every day | ORAL | 3 refills | Status: DC

## 2018-01-30 NOTE — Telephone Encounter (Signed)
Tarceva refill faxed to [facility].

## 2018-02-27 ENCOUNTER — Other Ambulatory Visit: Payer: Self-pay | Admitting: *Deleted

## 2018-02-27 MED ORDER — ERLOTINIB HCL 150 MG PO TABS: 150.0000 mg | ORAL_TABLET | Freq: Every day | ORAL | 3 refills | Status: DC

## 2018-02-27 NOTE — Telephone Encounter (Signed)
Pt called w/ request to go back to [facility] Name Brand not Generic as he is having red rash/diarrhea. Reviewed with MD, ok to continue per pt request. Refill called into [facility].

## 2018-03-25 ENCOUNTER — Other Ambulatory Visit: Payer: Self-pay | Admitting: Cardiovascular Disease

## 2018-03-25 DIAGNOSIS — I714 Abdominal aortic aneurysm, without rupture, unspecified: Secondary | ICD-10-CM

## 2018-03-25 DIAGNOSIS — I771 Stricture of artery: Secondary | ICD-10-CM

## 2018-04-05 ENCOUNTER — Other Ambulatory Visit: Payer: Self-pay | Admitting: Cardiovascular Disease

## 2018-04-05 ENCOUNTER — Ambulatory Visit (HOSPITAL_COMMUNITY)
Admission: RE | Admit: 2018-04-05 | Discharge: 2018-04-05 | Disposition: A | Discharge status: Home / Self Care | Payer: Medicare Other | Source: Ambulatory Visit | Attending: Internal Medicine | Admitting: Internal Medicine

## 2018-04-05 DIAGNOSIS — I771 Stricture of artery: Secondary | ICD-10-CM | POA: Diagnosis present

## 2018-04-05 DIAGNOSIS — I714 Abdominal aortic aneurysm, without rupture, unspecified: Secondary | ICD-10-CM

## 2018-04-08 ENCOUNTER — Encounter (HOSPITAL_COMMUNITY): Payer: Medicare Other

## 2018-04-16 ENCOUNTER — Telehealth: Payer: Self-pay | Admitting: Cardiovascular Disease

## 2018-04-16 DIAGNOSIS — I771 Stricture of artery: Secondary | ICD-10-CM

## 2018-04-16 NOTE — Telephone Encounter (Signed)
New Message:    Pt had Ultrasound test on 04-05-18. Pt have not received his results. He wants to know if he needs an appt to get results or will somebody call to give him the results?o

## 2018-04-16 NOTE — Telephone Encounter (Signed)
Informed patient of results and verbal understanding expressed.  Repeat study ordered to be scheduled in 1 year. Patient agrees with treatment plan. 

## 2018-04-16 NOTE — Telephone Encounter (Signed)
-----   Message from Sherren Mocha, MD sent at 04/07/2018  1:45 PM EDT ----- Stable findings with 3.8 cm AAA. 12 months FU recommended

## 2018-06-14 ENCOUNTER — Ambulatory Visit (HOSPITAL_COMMUNITY)
Admission: RE | Admit: 2018-06-14 | Discharge: 2018-06-14 | Disposition: A | Discharge status: Home / Self Care | Payer: Medicare Other | Source: Ambulatory Visit | Attending: Internal Medicine | Admitting: Internal Medicine

## 2018-06-14 ENCOUNTER — Encounter (INDEPENDENT_AMBULATORY_CARE_PROVIDER_SITE_OTHER): Payer: Self-pay

## 2018-06-14 ENCOUNTER — Ambulatory Visit: Payer: Medicare Other | Admitting: Cardiovascular Disease

## 2018-06-14 ENCOUNTER — Encounter: Payer: Self-pay | Admitting: Cardiovascular Disease

## 2018-06-14 ENCOUNTER — Ambulatory Visit (HOSPITAL_COMMUNITY): Payer: Medicare Other

## 2018-06-14 ENCOUNTER — Inpatient Hospital Stay: Payer: Medicare Other | Attending: Internal Medicine

## 2018-06-14 ENCOUNTER — Encounter (HOSPITAL_COMMUNITY): Payer: Self-pay

## 2018-06-14 VITALS — BP 142/76 | HR 82 | Ht 73.0 in | Wt 213.8 lb

## 2018-06-14 DIAGNOSIS — C3490 Malignant neoplasm of unspecified part of unspecified bronchus or lung: Secondary | ICD-10-CM

## 2018-06-14 DIAGNOSIS — R918 Other nonspecific abnormal finding of lung field: Secondary | ICD-10-CM | POA: Insufficient documentation

## 2018-06-14 DIAGNOSIS — I25119 Atherosclerotic heart disease of native coronary artery with unspecified angina pectoris: Secondary | ICD-10-CM | POA: Diagnosis not present

## 2018-06-14 DIAGNOSIS — I351 Nonrheumatic aortic (valve) insufficiency: Secondary | ICD-10-CM

## 2018-06-14 DIAGNOSIS — I2510 Atherosclerotic heart disease of native coronary artery without angina pectoris: Secondary | ICD-10-CM | POA: Diagnosis not present

## 2018-06-14 DIAGNOSIS — Z923 Personal history of irradiation: Secondary | ICD-10-CM | POA: Diagnosis not present

## 2018-06-14 DIAGNOSIS — E785 Hyperlipidemia, unspecified: Secondary | ICD-10-CM

## 2018-06-14 DIAGNOSIS — Z7982 Long term (current) use of aspirin: Secondary | ICD-10-CM | POA: Diagnosis not present

## 2018-06-14 DIAGNOSIS — Z9221 Personal history of antineoplastic chemotherapy: Secondary | ICD-10-CM | POA: Diagnosis not present

## 2018-06-14 DIAGNOSIS — I714 Abdominal aortic aneurysm, without rupture, unspecified: Secondary | ICD-10-CM

## 2018-06-14 DIAGNOSIS — I700 Atherosclerosis of aorta: Secondary | ICD-10-CM | POA: Insufficient documentation

## 2018-06-14 DIAGNOSIS — I10 Essential (primary) hypertension: Secondary | ICD-10-CM

## 2018-06-14 DIAGNOSIS — E119 Type 2 diabetes mellitus without complications: Secondary | ICD-10-CM | POA: Insufficient documentation

## 2018-06-14 DIAGNOSIS — Z79899 Other long term (current) drug therapy: Secondary | ICD-10-CM | POA: Diagnosis not present

## 2018-06-14 DIAGNOSIS — I771 Stricture of artery: Secondary | ICD-10-CM | POA: Diagnosis not present

## 2018-06-14 DIAGNOSIS — C3412 Malignant neoplasm of upper lobe, left bronchus or lung: Secondary | ICD-10-CM | POA: Diagnosis not present

## 2018-06-14 DIAGNOSIS — N289 Disorder of kidney and ureter, unspecified: Secondary | ICD-10-CM | POA: Diagnosis not present

## 2018-06-14 DIAGNOSIS — J439 Emphysema, unspecified: Secondary | ICD-10-CM | POA: Insufficient documentation

## 2018-06-14 DIAGNOSIS — Z7984 Long term (current) use of oral hypoglycemic drugs: Secondary | ICD-10-CM | POA: Insufficient documentation

## 2018-06-14 DIAGNOSIS — I358 Other nonrheumatic aortic valve disorders: Secondary | ICD-10-CM | POA: Diagnosis not present

## 2018-06-14 LAB — CBC WITH DIFFERENTIAL (CANCER CENTER ONLY)
Abs Immature Granulocytes: 0.02 10*3/uL (ref 0.00–0.07)
Basophils Absolute: 0 10*3/uL (ref 0.0–0.1)
Basophils Relative: 1 %
EOS ABS: 0.2 10*3/uL (ref 0.0–0.5)
EOS PCT: 2 %
HCT: 37.5 % — ABNORMAL LOW (ref 39.0–52.0)
HEMOGLOBIN: 12.9 g/dL — AB (ref 13.0–17.0)
Immature Granulocytes: 0 %
LYMPHS PCT: 26 %
Lymphs Abs: 2 10*3/uL (ref 0.7–4.0)
MCH: 30.2 pg (ref 26.0–34.0)
MCHC: 34.4 g/dL (ref 30.0–36.0)
MCV: 87.8 fL (ref 80.0–100.0)
MONO ABS: 0.8 10*3/uL (ref 0.1–1.0)
Monocytes Relative: 10 %
NRBC: 0 % (ref 0.0–0.2)
Neutro Abs: 4.9 10*3/uL (ref 1.7–7.7)
Neutrophils Relative %: 61 %
Platelet Count: 235 10*3/uL (ref 150–400)
RBC: 4.27 MIL/uL (ref 4.22–5.81)
RDW: 14 % (ref 11.5–15.5)
WBC: 7.9 10*3/uL (ref 4.0–10.5)

## 2018-06-14 LAB — CMP (CANCER CENTER ONLY)
ALK PHOS: 75 U/L (ref 38–126)
ALT: 19 U/L (ref 0–44)
AST: 18 U/L (ref 15–41)
Albumin: 3.7 g/dL (ref 3.5–5.0)
Anion gap: 11 (ref 5–15)
BILIRUBIN TOTAL: 1 mg/dL (ref 0.3–1.2)
BUN: 15 mg/dL (ref 8–23)
CALCIUM: 9.5 mg/dL (ref 8.9–10.3)
CO2: 23 mmol/L (ref 22–32)
CREATININE: 2.11 mg/dL — AB (ref 0.61–1.24)
Chloride: 105 mmol/L (ref 98–111)
GFR, EST AFRICAN AMERICAN: 35 mL/min — AB (ref >60)
GFR, EST NON AFRICAN AMERICAN: 31 mL/min — AB (ref >60)
Glucose, Bld: 169 mg/dL — ABNORMAL HIGH (ref 70–99)
Potassium: 4.7 mmol/L (ref 3.5–5.1)
Sodium: 139 mmol/L (ref 135–145)
TOTAL PROTEIN: 7.2 g/dL (ref 6.5–8.1)

## 2018-06-14 NOTE — Patient Instructions (Signed)
Medication Instructions:  Your provider recommends that you continue on your current medications as directed. Please refer to the Current Medication list given to you today.    Labwork: None  Testing/Procedures: You will have your abdominal ultrasound in August.  Follow-Up: Your provider wants you to follow-up in: 1 year with Dr. Burt Knack. You will receive a reminder letter in the mail two months in advance. If you don't receive a letter, please call our office to schedule the follow-up appointment.    Any Other Special Instructions Will Be Listed Below (If Applicable).     If you need a refill on your cardiac medications before your next appointment, please call your pharmacy.

## 2018-06-14 NOTE — Progress Notes (Signed)
Cardiology Office Note:    Date:  06/14/2018   ID:  [name], DOB 22-Mar-1951, MRN [id number]  PCP:  [name], MD  Cardiologist:  Sherren Mocha, MD  Electrophysiologist:  None   Referring MD: [name], MD   Chief Complaint  Patient presents with  . Coronary Artery Disease   History of Present Illness:    [name] is a 67 y.o. male with a hx of coronary artery disease, presenting for follow-up evaluation.  The patient initially presented in 2004 and he underwent PCI of the right coronary artery and left circumflex at that time.  He had crescendo angina in 2015 and was found to have severe in-stent restenosis of the circumflex, again treated with stenting using a DES.  He has done well from a cardiac perspective since that time.  The patient is here alone today.  He is doing well from a cardiac perspective.  He denies chest pain, chest pressure, shortness of breath, or heart palpitations.  He gets out and walks his dog, but is not really engaged in any regular exercise.  States that his wife uses a recumbent bike and a treadmill at home, but he has not exercised with his home equipment.  He is compliant with his medications.  No cardiac-related complaints today.  Past Medical History:  Diagnosis Date  . AAA (abdominal aortic aneurysm) (HCC)    a. 3.4 x 3.5 on scan 02/28/13.  . Allergic reaction to contrast dye   . Atherosclerosis   . CAD (coronary artery disease)    a. stenting of RCA/LCx 2004. b. [place] 08/2013:  s/p DES to LCx for severe ISR.  . Diabetes mellitus   . Diverticulosis   . Emphysema lung ([place])   . Emphysema of lung ([place])   . Encounter for therapeutic drug monitoring 06/06/2016  . Hemorrhoids   . Hepatic steatosis   . HTN (hypertension)   . Hyperlipidemia   . Kidney stone   . Lung cancer ([place]) dx'd 2004   chemo/xrt comp; tarceva ongoing    Past Surgical History:  Procedure Laterality Date  . ANGIOPLASTY / STENTING FEMORAL  2015   2004-groin   . LEFT HEART CATHETERIZATION WITH CORONARY ANGIOGRAM N/A 09/08/2013   Procedure: LEFT HEART CATHETERIZATION WITH CORONARY ANGIOGRAM;  Surgeon: Blane Ohara, MD;  Location[facility] C[facility] LAB;  Service: Cardiovascular;  Laterality: N/A;  . left partial lobectomy    . WISDOM TOOTH EXTRACTION      Current Medications: Current Meds  Medication Sig  . amLODipine (NORVASC) 2.5 MG tablet Take 2.5 mg by mouth daily.  Marland Kitchen aspirin 81 MG chewable tablet Chew 81 mg by mouth daily.  Marland Kitchen atorvastatin (LIPITOR) 40 MG tablet Take 40 mg by mouth daily.  . clopidogrel (PLAVIX) 75 MG tablet TAKE 1 TABLET (75 MG TOTAL) BY MOUTH DAILY.  Marland Kitchen erlotinib (TARCEVA) 150 MG tablet Take 1 tablet (150 mg total) by mouth daily. Take on an empty stomach 1 hour before meals or 2 hours after. Pt has requested Brand name not generic. Pt understands copay will be more and is in agreement.  Marland Kitchen glimepiride (AMARYL) 4 MG tablet Take 4 mg by mouth 2 (two) times daily.  . isosorbide mononitrate (IMDUR) 30 MG 24 hr tablet TAKE 1 TABLET BY MOUTH ONCE DAILY  . Loperamide HCl (IMODIUM A-D) 1 MG/7.5ML LIQD Take 7.5 mLs by mouth as directed.   Marland Kitchen losartan (COZAAR) 100 MG tablet TAKE ONE TABLET BY MOUTH EVERYDAY  .  metFORMIN (GLUCOPHAGE) 850 MG tablet Take 1 tablet (850 mg total) by mouth 2 (two) times daily with a meal.  . metoprolol (LOPRESSOR) 100 MG tablet Take 100 mg by mouth daily. Take one tablet by mouth daily with or following a meal  . nitroGLYCERIN (NITROSTAT) 0.4 MG SL tablet Place 1 tablet (0.4 mg total) under the tongue every 5 (five) minutes as needed for chest pain (up to 3 doses).  Marland Kitchen omeprazole (PRILOSEC) 20 MG capsule Take 20 mg by mouth 2 (two) times daily.  . zaleplon (SONATA) 10 MG capsule Take 10 mg by mouth at bedtime.     Allergies:   Iohexol; Morphine and related; and Other   Social History   Socioeconomic History  . Marital status: Married    Spouse name: Not on file  . Number of children: 1  . Years of education:  Not on file  . Highest education level: Not on file  Occupational History  . Occupation: Retired-disability  Social Needs  . Financial resource strain: Not on file  . Food insecurity:    Worry: Not on file    Inability: Not on file  . Transportation needs:    Medical: Not on file    Non-medical: Not on file  Tobacco Use  . Smoking status: Former Smoker    Last attempt to quit: 08/14/2002    Years since quitting: 15.8  . Smokeless tobacco: Never Used  Substance and Sexual Activity  . Alcohol use: No  . Drug use: No  . Sexual activity: Not on file  Lifestyle  . Physical activity:    Days per week: Not on file    Minutes per session: Not on file  . Stress: Not on file  Relationships  . Social connections:    Talks on phone: Not on file    Gets together: Not on file    Attends religious service: Not on file    Active member of club or organization: Not on file    Attends meetings of clubs or organizations: Not on file    Relationship status: Not on file  Other Topics Concern  . Not on file  Social History Narrative  . Not on file     Family History: The patient's family history includes Diabetes in his sister; Heart disease in his sister; Ovarian cancer in his sister. There is no history of Colon cancer, Esophageal cancer, Rectal cancer, or Stomach cancer.  ROS:   Please see the history of present illness.    All other systems reviewed and are negative.  EKGs/Labs/Other Studies Reviewed:    The following studies were reviewed today: Abdominal aortic duplex scan 04/05/2018: Final Interpretation: Abdominal Aorta: There is evidence of abnormal dilitation of the Mid Abdominal aorta. The largest aortic measurement is 3.8 cm. The largest aortic diameter remains essentially unchanged compared to prior exam. Previous diameter measurement was 3.8 cm  obtained on 7/18. Stenosis: +--------------------+-------------+ Location      Stenosis     +--------------------+-------------+ Prox Aorta     <50% stenosis +--------------------+-------------+ Right Common Iliac <50% stenosis +--------------------+-------------+ Left Common Iliac  >50% stenosis +--------------------+-------------+ Right External Iliac<50% stenosis +--------------------+-------------+ Left External Iliac >50% stenosis +--------------------+-------------+  2D echocardiogram 05/30/2017: Study Conclusions  - Left ventricle: The cavity size was normal. Wall thickness was   increased in a pattern of mild LVH. Systolic function was mildly   reduced. The estimated ejection fraction was in the range of 45%   to 50%. Diffuse hypokinesis. Doppler parameters are  consistent   with abnormal left ventricular relaxation (grade 1 diastolic   dysfunction). Doppler parameters are consistent with   indeterminate ventricular filling pressure. - Aortic valve: Transvalvular velocity was within the normal range.   There was no stenosis. There was moderate regurgitation.   Regurgitation pressure half-time: 472 ms. - Mitral valve: Transvalvular velocity was within the normal range.   There was no evidence for stenosis. There was no regurgitation. - Right ventricle: The cavity size was normal. Wall thickness was   normal. Systolic function was normal. - Atrial septum: No defect or patent foramen ovale was identified. - Tricuspid valve: There was no regurgitation.  EKG:  EKG is ordered today.  The ekg ordered today demonstrates normal sinus rhythm 82 bpm, within normal limits.  Recent Labs: 12/05/2017: ALT 19; BUN 18; Creatinine, Ser 1.94; Hemoglobin 12.6; Platelets 241; Potassium 4.3; Sodium 139  Recent Lipid Panel No results found for: CHOL, TRIG, HDL, CHOLHDL, VLDL, LDLCALC, LDLDIRECT  Physical Exam:    VS:  BP (!) 142/76   Pulse 82   Ht 6\' 1"  (1.854 m)   Wt 213 lb 12.8 oz (97 kg)   SpO2 97%   BMI 28.21 kg/m     Wt Readings from Last 3  Encounters:  06/14/18 213 lb 12.8 oz (97 kg)  12/12/17 217 lb 1.6 oz (98.5 kg)  06/07/17 212 lb 9.6 oz (96.4 kg)     GEN:  Well nourished, well developed in no acute distress HEENT: Normal NECK: No JVD; No carotid bruits LYMPHATICS: No lymphadenopathy CARDIAC: RRR, no murmurs, rubs, gallops RESPIRATORY:  Clear to auscultation without rales, wheezing or rhonchi  ABDOMEN: Soft, non-tender, non-distended MUSCULOSKELETAL:  No edema; No deformity  SKIN: Warm and dry NEUROLOGIC:  Alert and oriented x 3 PSYCHIATRIC:  Normal affect   ASSESSMENT:    1. Coronary artery disease involving native coronary artery of native heart with angina pectoris ([place])   2. AAA (abdominal aortic aneurysm) without rupture ([place])   3. Iliac artery stenosis, bilateral ([place])   4. Essential hypertension   5. Dyslipidemia   6. Aortic valve insufficiency, etiology of cardiac valve disease unspecified    PLAN:    In order of problems listed above:  1. The patient is stable, symptom-free on dual antiplatelet therapy with aspirin and clopidogrel, and antianginal treatment with amlodipine, isosorbide, and metoprolol. 2. Study reviewed as above with stable 3.8 cm infrarenal abdominal aortic aneurysm.  We will continue to follow with annual ultrasound imaging.  Discussed risk modification with the patient today. 3. Patient is specifically questioned about claudication symptoms and he has no limitation or symptoms of leg pain. 4. I reviewed his blood pressure readings, 80% of which are at goal.  We will continue current management with amlodipine, isosorbide, losartan, and metoprolol. 5. Treated with a high intensity statin drug.  Lipids are followed by his primary physician. 6. The patient has had moderate aortic valve insufficiency.  There is no audible diastolic heart murmur.  He is asymptomatic.  Continue clinical follow-up.   Medication Adjustments/Labs and Tests Ordered: Current medicines are reviewed at length  with the patient today.  Concerns regarding medicines are outlined above.  No orders of the defined types were placed in this encounter.  No orders of the defined types were placed in this encounter.   There are no Patient Instructions on file for this visit.   Signed, Sherren Mocha, MD  06/14/2018 8:13 AM    [place]

## 2018-06-17 ENCOUNTER — Encounter: Payer: Self-pay | Admitting: Internal Medicine

## 2018-06-17 ENCOUNTER — Inpatient Hospital Stay: Payer: Medicare Other | Admitting: Internal Medicine

## 2018-06-17 VITALS — BP 119/62 | HR 81 | Temp 97.9°F | Resp 18 | Ht 73.0 in | Wt 211.9 lb

## 2018-06-17 DIAGNOSIS — C3412 Malignant neoplasm of upper lobe, left bronchus or lung: Secondary | ICD-10-CM | POA: Diagnosis not present

## 2018-06-17 DIAGNOSIS — E119 Type 2 diabetes mellitus without complications: Secondary | ICD-10-CM

## 2018-06-17 DIAGNOSIS — Z79899 Other long term (current) drug therapy: Secondary | ICD-10-CM

## 2018-06-17 DIAGNOSIS — I10 Essential (primary) hypertension: Secondary | ICD-10-CM

## 2018-06-17 DIAGNOSIS — Z9221 Personal history of antineoplastic chemotherapy: Secondary | ICD-10-CM | POA: Diagnosis not present

## 2018-06-17 DIAGNOSIS — Z7982 Long term (current) use of aspirin: Secondary | ICD-10-CM

## 2018-06-17 DIAGNOSIS — Z923 Personal history of irradiation: Secondary | ICD-10-CM | POA: Diagnosis not present

## 2018-06-17 DIAGNOSIS — Z7984 Long term (current) use of oral hypoglycemic drugs: Secondary | ICD-10-CM

## 2018-06-17 DIAGNOSIS — N289 Disorder of kidney and ureter, unspecified: Secondary | ICD-10-CM | POA: Diagnosis not present

## 2018-06-17 DIAGNOSIS — C3490 Malignant neoplasm of unspecified part of unspecified bronchus or lung: Secondary | ICD-10-CM

## 2018-06-17 DIAGNOSIS — Z5181 Encounter for therapeutic drug level monitoring: Secondary | ICD-10-CM

## 2018-06-17 NOTE — Progress Notes (Signed)
[place] [phone]   [phone]  OFFICE PROGRESS NOTE  [name], MD [address], [address] [place] [address]  DIAGNOSIS: Metastatic non-small cell lung cancer, adenocarcinoma initially diagnosed in December of 2004  PRIOR THERAPY: 1) status post course of concurrent chemoradiation under the care of Dr. Truddie Coco. 2) status post left upper lobe wedge resection on 01/10/2005 for recurrent disease in the left lung.  CURRENT THERAPY: Tarceva 150 mg by mouth daily started in 2006.  INTERVAL HISTORY: [name] 67 y.o. male returns to the clinic today for annual follow-up visit.  The patient is feeling fine today with no concerning complaints.  He denied having any chest pain, shortness of breath, cough or hemoptysis.  He denied having any fever or chills.  He has no nausea, vomiting, diarrhea or constipation.  He has no headache or visual changes.  He continues to tolerate his treatment with Tarceva fairly well.  The patient had a repeat CT scan of the chest performed recently and he is here for evaluation and discussion of his discuss results.  MEDICAL HISTORY: Past Medical History:  Diagnosis Date  . AAA (abdominal aortic aneurysm) (HCC)    a. 3.4 x 3.5 on scan 02/28/13.  . Allergic reaction to contrast dye   . Atherosclerosis   . CAD (coronary artery disease)    a. stenting of RCA/LCx 2004. b. [place] 08/2013:  s/p DES to LCx for severe ISR.  . Diabetes mellitus   . Diverticulosis   . Emphysema lung ([facility])   . Emphysema of lung ([place])   . Encounter for therapeutic drug monitoring 06/06/2016  . Hemorrhoids   . Hepatic steatosis   . HTN (hypertension)   . Hyperlipidemia   . Kidney stone   . Lung cancer ([place] [facility] [place]) dx'd 2004   chemo/xrt comp; tarceva ongoing    ALLERGIES:  is allergic to iohexol; morphine and related; and other.  MEDICATIONS:  Current Outpatient Medications  Medication Sig Dispense Refill  . amLODipine  (NORVASC) 2.5 MG tablet Take 2.5 mg by mouth daily.  5  . aspirin 81 MG chewable tablet Chew 81 mg by mouth daily.    Marland Kitchen atorvastatin (LIPITOR) 40 MG tablet Take 40 mg by mouth daily.    . clopidogrel (PLAVIX) 75 MG tablet TAKE 1 TABLET (75 MG TOTAL) BY MOUTH DAILY. 90 tablet 2  . erlotinib (TARCEVA) 150 MG tablet Take 1 tablet (150 mg total) by mouth daily. Take on an empty stomach 1 hour before meals or 2 hours after. Pt has requested Brand name not generic. Pt understands copay will be more and is in agreement. 30 tablet 3  . glimepiride (AMARYL) 4 MG tablet Take 4 mg by mouth 2 (two) times daily.    . isosorbide mononitrate (IMDUR) 30 MG 24 hr tablet TAKE 1 TABLET BY MOUTH ONCE DAILY 90 tablet 1  . Loperamide HCl (IMODIUM A-D) 1 MG/7.5ML LIQD Take 7.5 mLs by mouth as directed.     Marland Kitchen losartan (COZAAR) 100 MG tablet TAKE ONE TABLET BY MOUTH EVERYDAY  5  . metFORMIN (GLUCOPHAGE) 850 MG tablet Take 1 tablet (850 mg total) by mouth 2 (two) times daily with a meal.    . metoprolol (LOPRESSOR) 100 MG tablet Take 100 mg by mouth daily. Take one tablet by mouth daily with or following a meal  4  . nitroGLYCERIN (NITROSTAT) 0.4 MG SL tablet Place 1 tablet (0.4 mg total) under the  tongue every 5 (five) minutes as needed for chest pain (up to 3 doses). 25 tablet 3  . omeprazole (PRILOSEC) 20 MG capsule Take 20 mg by mouth 2 (two) times daily.  5  . zaleplon (SONATA) 10 MG capsule Take 10 mg by mouth at bedtime.  5   No current facility-administered medications for this visit.     REVIEW OF SYSTEMS:  Constitutional: negative Eyes: negative Ears, nose, mouth, throat, and face: negative Respiratory: negative Cardiovascular: negative Gastrointestinal: negative Genitourinary:negative Integument/breast: negative Hematologic/lymphatic: negative Musculoskeletal:negative Neurological: negative Behavioral/Psych: negative Endocrine: negative Allergic/Immunologic: negative   PHYSICAL EXAMINATION: General  appearance: alert, cooperative and no distress Head: Normocephalic, without obvious abnormality, atraumatic Neck: no adenopathy Lymph nodes: Cervical, supraclavicular, and axillary nodes normal. Resp: clear to auscultation bilaterally Back: symmetric, no curvature. ROM normal. No CVA tenderness. Cardio: regular rate and rhythm, S1, S2 normal, no murmur, click, rub or gallop GI: soft, non-tender; bowel sounds normal; no masses,  no organomegaly Extremities: extremities normal, atraumatic, no cyanosis or edema Neurologic: Alert and oriented X 3, normal strength and tone. Normal symmetric reflexes. Normal coordination and gait  ECOG PERFORMANCE STATUS: 0 - Asymptomatic  Blood pressure 119/62, pulse 81, temperature 97.9 F (36.6 C), temperature source Oral, resp. rate 18, height 6' 1" (1.854 m), weight 211 lb 14.4 oz (96.1 kg), SpO2 98 %.  LABORATORY DATA: Lab Results  Component Value Date   WBC 7.9 06/14/2018   HGB 12.9 (L) 06/14/2018   HCT 37.5 (L) 06/14/2018   MCV 87.8 06/14/2018   PLT 235 06/14/2018      Chemistry      Component Value Date/Time   NA 139 06/14/2018 0902   NA 139 06/04/2017 0948   K 4.7 06/14/2018 0902   K 5.1 06/04/2017 0948   CL 105 06/14/2018 0902   CL 106 06/10/2012 0900   CO2 23 06/14/2018 0902   CO2 26 06/04/2017 0948   BUN 15 06/14/2018 0902   BUN 14.2 06/04/2017 0948   CREATININE 2.11 (H) 06/14/2018 0902   CREATININE 1.5 (H) 06/04/2017 0948      Component Value Date/Time   CALCIUM 9.5 06/14/2018 0902   CALCIUM 9.6 06/04/2017 0948   ALKPHOS 75 06/14/2018 0902   ALKPHOS 84 06/04/2017 0948   AST 18 06/14/2018 0902   AST 24 06/04/2017 0948   ALT 19 06/14/2018 0902   ALT 35 06/04/2017 0948   BILITOT 1.0 06/14/2018 0902   BILITOT 0.82 06/04/2017 0948       RADIOGRAPHIC STUDIES: Ct Chest Wo Contrast  Result Date: 06/14/2018 CLINICAL DATA:  Lung cancer. Non-small-cell. Left partial lobectomy. Chemotherapy and radiation therapy complete.  Immunotherapy on going. EXAM: CT CHEST WITHOUT CONTRAST TECHNIQUE: Multidetector CT imaging of the chest was performed following the standard protocol without IV contrast. COMPARISON:  Plain films 12/05/2017.  Most recent CT 06/04/2017. FINDINGS: Cardiovascular: Aortic and branch vessel atherosclerosis. Aortic valve calcification. Normal heart size, without pericardial effusion. Multivessel coronary artery atherosclerosis. Mediastinum/Nodes: No supraclavicular adenopathy. No mediastinal or definite hilar adenopathy, given limitations of unenhanced CT. Lungs/Pleura: Right-sided pleural thickening superiorly and posteriorly is similar. Moderate bullous type emphysema. Anteromedial right upper lobe 6 mm ground-glass nodule is unchanged on image 58/7. Thickening of the right minor fissure is similar on image 99/7. A right lower lobe pulmonary nodule measures 4 mm on image 92/7 and is new or significantly enlarged compared to the prior. Posterior right upper lobe scarring could be postsurgical and is unchanged. Calcified granulomas involving the right apex and  right upper lobe. Postsurgical scarring in the posterior left upper lobe is similar in configuration to on the prior. This measures maximally 10 mm, including on image 64/7. Upper Abdomen: Right hepatic lobe 11 mm hypoattenuating lesion is present back to 2016 and can be presumed benign. Normal imaged portions of the spleen, stomach, pancreas, gallbladder, adrenal glands, kidneys. Musculoskeletal: Posterior right seventh and eighth rib deformities are chronic and likely postoperative or posttraumatic. IMPRESSION: 1. Posterior left upper lobe wedge resection, without findings of recurrent disease. 2. No thoracic adenopathy. 3. 4 mm right lower lobe pulmonary nodule, either new or significantly enlarged compared to 06/04/2017. Indeterminate. Although this could be benign, isolated pulmonary metastasis or metachronous primary cannot be excluded. Recommend attention on  follow-up. 4. Aortic atherosclerosis (ICD10-I70.0), coronary artery atherosclerosis and emphysema (ICD10-J43.9). 5. Aortic valvular calcifications. Consider echocardiography to evaluate for valvular dysfunction. 6. Similar right upper lobe ground-glass nodule. Electronically Signed   By: Abigail Miyamoto M.D.   On: 06/14/2018 14:45    ASSESSMENT/PLAN:  This is a very pleasant 67 years old white male with metastatic non-small cell lung cancer, adenocarcinoma with positive EGFR mutation and he is currently on treatment with Tarceva since 2006. The patient has been tolerating this treatment well with no concerning adverse effects. Repeat CT scan of the chest showed no concerning findings except for suspicious for millimeter right lower lobe pulmonary nodule that need close observation. I personally and independently reviewed the scan images and discussed the results and showed the images to the patient today.  I recommended for him to have repeat CT scan of the chest in 6 months for further evaluation of this new pulmonary nodule. He was also advised to call immediately if he has any concerning symptoms in the interval. For the renal insufficiency I strongly recommend for the patient to discuss the lab results with his primary care physician and to see if he needs referral to nephrology. The patient was advised to call immediately if he has any concerning symptoms in the interval. All questions were answered. The patient knows to call the clinic with any problems, questions or concerns. We can certainly see the patient much sooner if necessary.  Disclaimer: This note was dictated with voice recognition software. Similar sounding words can inadvertently be transcribed and may not be corrected upon review.

## 2018-06-18 ENCOUNTER — Telehealth: Payer: Self-pay | Admitting: Internal Medicine

## 2018-06-18 NOTE — Telephone Encounter (Signed)
Called patient and left a voice message.  Printed and mailed calendar.

## 2018-06-19 ENCOUNTER — Other Ambulatory Visit: Payer: Self-pay | Admitting: Internal Medicine

## 2018-06-19 DIAGNOSIS — N200 Calculus of kidney: Secondary | ICD-10-CM

## 2018-06-24 ENCOUNTER — Ambulatory Visit
Admission: RE | Admit: 2018-06-24 | Discharge: 2018-06-24 | Disposition: A | Discharge status: Home / Self Care | Payer: Medicare Other | Source: Ambulatory Visit | Attending: Internal Medicine | Admitting: Internal Medicine

## 2018-06-24 DIAGNOSIS — N200 Calculus of kidney: Secondary | ICD-10-CM

## 2018-07-05 ENCOUNTER — Telehealth: Payer: Self-pay | Admitting: *Deleted

## 2018-07-05 NOTE — Telephone Encounter (Signed)
Medical records faxed to [facility]; release [id number]

## 2018-07-08 ENCOUNTER — Telehealth: Payer: Self-pay | Admitting: Medical Oncology

## 2018-07-08 NOTE — Telephone Encounter (Signed)
Per wife , [place] insurance did not receive records. New fax [phone]. CC [facility] med records

## 2018-07-23 ENCOUNTER — Telehealth: Payer: Self-pay | Admitting: Medical Oncology

## 2018-07-23 ENCOUNTER — Telehealth: Payer: Self-pay | Admitting: Pharmacist

## 2018-07-23 DIAGNOSIS — C3490 Malignant neoplasm of unspecified part of unspecified bronchus or lung: Secondary | ICD-10-CM

## 2018-07-23 MED ORDER — TARCEVA 150 MG PO TABS: 150.0000 mg | ORAL_TABLET | Freq: Every day | ORAL | 3 refills | Status: DC

## 2018-07-23 NOTE — Telephone Encounter (Signed)
his insurance is wanting him to get generic Tarceva . He wants to stay on brand Tarceva because he had hives and upset stomach with generic.

## 2018-07-23 NOTE — Telephone Encounter (Signed)
Oral Oncology Pharmacist Encounter  Received notification from collaborative practice RN that patient's insurance is requiring generic erlotinib and patient wishes to continue on brand-name Tarceva as the generic product causes hives and upset stomach.  Prior authorization for Tarceva 150 mg tablets submitted on cover my meds Key: [id number] Status is approved Effective dates: 07/23/2018-08/14/2019  New prescription for Tarceva 150 mg tablets, DAW, has been E scribed to [facility] as this is where patient has previously been receiving his Tarceva.  I called patient to update him on insurance authorization and brand-name only prescription.  Spoke with patient today to follow up regarding patient's oral chemotherapy medication: Tarceva (erlotinib) for the treatment of metastatic non small cell lung cancer, positive for EGFR mutation, planned duration until disease progression or unacceptable toxicity  Original Start date of oral chemotherapy: 2006  Pt is doing well today  Pt reports 0 tablets/doses of Tarceva 159m tablets, 1 tablet (1516m by mouth once daily, on an empty stomach, 1 hour before or 2 hours after meals, missed in the last month.   Pt reports the following side effects: very intermittent diarrhea, much improved since Tarceva initiaion  Pertinent labs reviewed: OK for continued treatment.  Other Issues: medication acquisition issues as above  Patient knows to call the office with questions or concerns. Oral Oncology Clinic will continue to follow.  JeJohny DrillingPharmD, BCPS, BCOP  07/23/2018 3:27 PM Oral Oncology Clinic [phone]

## 2018-09-16 ENCOUNTER — Telehealth: Payer: Self-pay | Admitting: Pharmacist

## 2018-09-16 NOTE — Telephone Encounter (Signed)
Oral Oncology Pharmacist Encounter  Received call from [employer] prior authorization department with request for additional information for Tarceva brand-name request submitted earlier today.  Provided information that patient has not failed generic erlotinib, but did experience intolerable skin reaction when changing from brand name product to generic product.  Received notification that insurance authorization for brand-name Tarceva 150 mg tablets has been approved. Non-formulary exception is approved through 08/14/2019 Reference #[id number]  I called and updated patient with above information. Patient expressed understanding and appreciation. Patient informed that [facility] will be able to continue to process his prescription for brand-name Tarceva without issues. Patient knows to call the office with any additional questions or concerns.  Johny Drilling, PharmD, BCPS, BCOP  09/16/2018 2:26 PM Oral Oncology Clinic [phone]

## 2018-09-16 NOTE — Telephone Encounter (Signed)
Oral Oncology Pharmacist Encounter  Received forwarded voicemail from patient with questions about Tarceva insurance authorization. I returned call to patient and was provided information that patient had received a letter from [place] [employer] prescription insurance coverage stating that Tarceva tablets were not on his plans drug list formulary. Drug list does include generic product erlotinib. Letter states that insurance will authorize a temporary supply to allow time for patient's medical provider to request that his prescription drug plan and make an exception to cover requested Tarceva. Patient has been on brand-name Tarceva since 2006. He briefly did try the generic version and experienced hives, which were relieved when changing back to the brand name product.  Insurance authorization for brand-name Tarceva 150 mg tablets has been submitted on cover [email or web address] Key: [id number] Status is pending  I will update patient about insurance authorization as soon as I have additional information.  I provided fax number to oral oncology clinic to patient, he faxed a copy of the letter he received from [place] [employer]. I provided patient direct dial to oral oncology clinic in case he has additional questions or concerns.  This encounter will continue to be updated until final determination.  Johny Drilling, PharmD, BCPS, BCOP  09/16/2018 1:48 PM Oral Oncology Clinic [phone]

## 2018-11-25 ENCOUNTER — Telehealth: Payer: Self-pay | Admitting: Medical Oncology

## 2018-11-25 NOTE — Telephone Encounter (Signed)
Confirmed appts for may

## 2018-11-26 ENCOUNTER — Other Ambulatory Visit: Payer: Self-pay | Admitting: Medical Oncology

## 2018-11-26 DIAGNOSIS — C3490 Malignant neoplasm of unspecified part of unspecified bronchus or lung: Secondary | ICD-10-CM

## 2018-11-26 MED ORDER — TARCEVA 150 MG PO TABS: 150.0000 mg | ORAL_TABLET | Freq: Every day | ORAL | 3 refills | Status: DC

## 2018-12-13 ENCOUNTER — Telehealth: Payer: Self-pay | Admitting: *Deleted

## 2018-12-13 ENCOUNTER — Ambulatory Visit (HOSPITAL_COMMUNITY)
Admission: RE | Admit: 2018-12-13 | Discharge: 2018-12-13 | Disposition: A | Discharge status: Home / Self Care | Payer: Medicare Other | Source: Ambulatory Visit | Attending: Internal Medicine | Admitting: Internal Medicine

## 2018-12-13 ENCOUNTER — Ambulatory Visit (HOSPITAL_COMMUNITY): Payer: Medicare Other

## 2018-12-13 ENCOUNTER — Inpatient Hospital Stay: Payer: Medicare Other | Attending: Internal Medicine

## 2018-12-13 ENCOUNTER — Other Ambulatory Visit: Payer: Self-pay

## 2018-12-13 ENCOUNTER — Ambulatory Visit (HOSPITAL_COMMUNITY): Admission: RE | Admit: 2018-12-13 | Payer: Medicare Other | Source: Ambulatory Visit

## 2018-12-13 DIAGNOSIS — C3490 Malignant neoplasm of unspecified part of unspecified bronchus or lung: Secondary | ICD-10-CM | POA: Insufficient documentation

## 2018-12-13 DIAGNOSIS — C3412 Malignant neoplasm of upper lobe, left bronchus or lung: Secondary | ICD-10-CM | POA: Diagnosis not present

## 2018-12-13 LAB — CMP (CANCER CENTER ONLY)
ALT: 17 U/L (ref 0–44)
AST: 15 U/L (ref 15–41)
Albumin: 3.5 g/dL (ref 3.5–5.0)
Alkaline Phosphatase: 82 U/L (ref 38–126)
Anion gap: 7 (ref 5–15)
BUN: 11 mg/dL (ref 8–23)
CO2: 27 mmol/L (ref 22–32)
Calcium: 8.7 mg/dL — ABNORMAL LOW (ref 8.9–10.3)
Chloride: 105 mmol/L (ref 98–111)
Creatinine: 1.8 mg/dL — ABNORMAL HIGH (ref 0.61–1.24)
GFR, Est AFR Am: 44 mL/min — ABNORMAL LOW (ref >60)
GFR, Estimated: 38 mL/min — ABNORMAL LOW (ref >60)
Glucose, Bld: 192 mg/dL — ABNORMAL HIGH (ref 70–99)
Potassium: 4.5 mmol/L (ref 3.5–5.1)
Sodium: 139 mmol/L (ref 135–145)
Total Bilirubin: 0.8 mg/dL (ref 0.3–1.2)
Total Protein: 7 g/dL (ref 6.5–8.1)

## 2018-12-13 LAB — CBC WITH DIFFERENTIAL (CANCER CENTER ONLY)
Abs Immature Granulocytes: 0.02 10*3/uL (ref 0.00–0.07)
Basophils Absolute: 0 10*3/uL (ref 0.0–0.1)
Basophils Relative: 1 %
Eosinophils Absolute: 0.2 10*3/uL (ref 0.0–0.5)
Eosinophils Relative: 3 %
HCT: 39.7 % (ref 39.0–52.0)
Hemoglobin: 13.3 g/dL (ref 13.0–17.0)
Immature Granulocytes: 0 %
Lymphocytes Relative: 32 %
Lymphs Abs: 2.5 10*3/uL (ref 0.7–4.0)
MCH: 28.4 pg (ref 26.0–34.0)
MCHC: 33.5 g/dL (ref 30.0–36.0)
MCV: 84.8 fL (ref 80.0–100.0)
Monocytes Absolute: 0.7 10*3/uL (ref 0.1–1.0)
Monocytes Relative: 9 %
Neutro Abs: 4.4 10*3/uL (ref 1.7–7.7)
Neutrophils Relative %: 55 %
Platelet Count: 233 10*3/uL (ref 150–400)
RBC: 4.68 MIL/uL (ref 4.22–5.81)
RDW: 13.9 % (ref 11.5–15.5)
WBC Count: 7.9 10*3/uL (ref 4.0–10.5)
nRBC: 0 % (ref 0.0–0.2)

## 2018-12-13 NOTE — Telephone Encounter (Signed)
Pt made aware of phone visit on Monday, 12/16/18

## 2018-12-13 NOTE — Telephone Encounter (Signed)
Received vm message from patient regarding upcoming appt on 12/16/18 with Dr. Julien Nordmann.  Pt prefers phone visit, rather than coming here for visit d/t COVID 19 situation. Pt had CT scans done today and Monday's visit is to review results.  OK for phone visit?

## 2018-12-13 NOTE — Telephone Encounter (Signed)
[facility] for a phone visit or WebEx. Thank you

## 2018-12-16 ENCOUNTER — Telehealth: Payer: Self-pay | Admitting: Internal Medicine

## 2018-12-16 ENCOUNTER — Inpatient Hospital Stay (HOSPITAL_BASED_OUTPATIENT_CLINIC_OR_DEPARTMENT_OTHER): Payer: Medicare Other | Admitting: Internal Medicine

## 2018-12-16 ENCOUNTER — Encounter: Payer: Self-pay | Admitting: Internal Medicine

## 2018-12-16 DIAGNOSIS — Z7984 Long term (current) use of oral hypoglycemic drugs: Secondary | ICD-10-CM | POA: Diagnosis not present

## 2018-12-16 DIAGNOSIS — Z7982 Long term (current) use of aspirin: Secondary | ICD-10-CM

## 2018-12-16 DIAGNOSIS — Z5181 Encounter for therapeutic drug level monitoring: Secondary | ICD-10-CM | POA: Diagnosis not present

## 2018-12-16 DIAGNOSIS — C3490 Malignant neoplasm of unspecified part of unspecified bronchus or lung: Secondary | ICD-10-CM

## 2018-12-16 DIAGNOSIS — Z7902 Long term (current) use of antithrombotics/antiplatelets: Secondary | ICD-10-CM

## 2018-12-16 DIAGNOSIS — Z79899 Other long term (current) drug therapy: Secondary | ICD-10-CM

## 2018-12-16 DIAGNOSIS — C3412 Malignant neoplasm of upper lobe, left bronchus or lung: Secondary | ICD-10-CM | POA: Diagnosis not present

## 2018-12-16 NOTE — Telephone Encounter (Signed)
Scheduled appt per 5/4 los - sent reminder letter in the mail with apt date and time

## 2018-12-16 NOTE — Progress Notes (Signed)
[facility] Telephone:(336) [phone]   Fax:(336) [phone]  PROGRESS NOTE FOR TELEMEDICINE VISITS  [name], MD [address], [address] [place] [address]  I connected with@ on 12/16/18 at  9:00 AM EDT by telephone visit and verified that I am speaking with the correct person using two identifiers.   I discussed the limitations, risks, security and privacy concerns of performing an evaluation and management service by telemedicine and the availability of in-person appointments. I also discussed with the patient that there may be a patient responsible charge related to this service. The patient expressed understanding and agreed to proceed.  Other persons participating in the visit and their role in the encounter:  None  Patient's location:  Home  Provider's location: [place]  DIAGNOSIS: Metastatic non-small cell lung cancer, adenocarcinoma initially diagnosed in December of 2004  PRIOR THERAPY: 1) status post course of concurrent chemoradiation under the care of Dr. Truddie Coco. 2) status post left upper lobe wedge resection on 01/10/2005 for recurrent disease in the left lung.  CURRENT THERAPY: Tarceva 150 mg by mouth daily started in 2006.  INTERVAL HISTORY: [name] 68 y.o. male has a virtual telephone visit with me today for evaluation and discussion of his scan results.  The patient is feeling fine today with no concerning complaints.  He has no change in his medication except for Januvia and placement of metformin.  The patient denied having any chest pain, shortness of breath, cough or hemoptysis.  He denied having any fever or chills.  He has no nausea, vomiting, diarrhea or constipation.  He denied having any headache or visual changes.  He had repeat CT scan of the chest performed recently and we are having the visit for evaluation and discussion of his scan results.  MEDICAL HISTORY: Past Medical History:  Diagnosis Date  .  AAA (abdominal aortic aneurysm) (HCC)    a. 3.4 x 3.5 on scan 02/28/13.  . Allergic reaction to contrast dye   . Atherosclerosis   . CAD (coronary artery disease)    a. stenting of RCA/LCx 2004. b. [place] 08/2013:  s/p DES to LCx for severe ISR.  . Diabetes mellitus   . Diverticulosis   . Emphysema lung ([facility])   . Emphysema of lung ([place] [facility])   . Encounter for therapeutic drug monitoring 06/06/2016  . Hemorrhoids   . Hepatic steatosis   . HTN (hypertension)   . Hyperlipidemia   . Kidney stone   . Lung cancer ([place]) dx'd 2004   chemo/xrt comp; tarceva ongoing    ALLERGIES:  is allergic to iohexol; morphine and related; and other.  MEDICATIONS:  Current Outpatient Medications  Medication Sig Dispense Refill  . amLODipine (NORVASC) 2.5 MG tablet Take 2.5 mg by mouth daily.  5  . aspirin 81 MG chewable tablet Chew 81 mg by mouth daily.    Marland Kitchen atorvastatin (LIPITOR) 40 MG tablet Take 40 mg by mouth daily.    . clopidogrel (PLAVIX) 75 MG tablet TAKE 1 TABLET (75 MG TOTAL) BY MOUTH DAILY. 90 tablet 2  . glimepiride (AMARYL) 4 MG tablet Take 4 mg by mouth 2 (two) times daily.    . isosorbide mononitrate (IMDUR) 30 MG 24 hr tablet TAKE 1 TABLET BY MOUTH ONCE DAILY 90 tablet 1  . Loperamide HCl (IMODIUM A-D) 1 MG/7.5ML LIQD Take 7.5 mLs by mouth as directed.     Marland Kitchen losartan (COZAAR) 100 MG tablet TAKE ONE TABLET BY MOUTH EVERYDAY  5  . metFORMIN (GLUCOPHAGE) 850 MG tablet Take 1 tablet (850 mg total) by mouth 2 (two) times daily with a meal.    . metoprolol (LOPRESSOR) 100 MG tablet Take 100 mg by mouth daily. Take one tablet by mouth daily with or following a meal  4  . nitroGLYCERIN (NITROSTAT) 0.4 MG SL tablet Place 1 tablet (0.4 mg total) under the tongue every 5 (five) minutes as needed for chest pain (up to 3 doses). 25 tablet 3  . omeprazole (PRILOSEC) 20 MG capsule Take 20 mg by mouth 2 (two) times daily.  5  . TARCEVA 150 MG tablet Take 1 tablet (150 mg total) by mouth daily. Take on an empty  stomach 1 hour before meals or 2 hours after. 30 tablet 3  . zaleplon (SONATA) 10 MG capsule Take 10 mg by mouth at bedtime.  5   No current facility-administered medications for this visit.     SURGICAL HISTORY:  Past Surgical History:  Procedure Laterality Date  . ANGIOPLASTY / STENTING FEMORAL  2015   2004-groin  . LEFT HEART CATHETERIZATION WITH CORONARY ANGIOGRAM N/A 09/08/2013   Procedure: LEFT HEART CATHETERIZATION WITH CORONARY ANGIOGRAM;  Surgeon: Blane Ohara, MD;  Location: [facility] CATH LAB;  Service: Cardiovascular;  Laterality: N/A;  . left partial lobectomy    . WISDOM TOOTH EXTRACTION      REVIEW OF SYSTEMS:  A comprehensive review of systems was negative.   LABORATORY DATA: Lab Results  Component Value Date   WBC 7.9 12/13/2018   HGB 13.3 12/13/2018   HCT 39.7 12/13/2018   MCV 84.8 12/13/2018   PLT 233 12/13/2018      Chemistry      Component Value Date/Time   NA 139 12/13/2018 0746   NA 139 06/04/2017 0948   K 4.5 12/13/2018 0746   K 5.1 06/04/2017 0948   CL 105 12/13/2018 0746   CL 106 06/10/2012 0900   CO2 27 12/13/2018 0746   CO2 26 06/04/2017 0948   BUN 11 12/13/2018 0746   BUN 14.2 06/04/2017 0948   CREATININE 1.80 (H) 12/13/2018 0746   CREATININE 1.5 (H) 06/04/2017 0948      Component Value Date/Time   CALCIUM 8.7 (L) 12/13/2018 0746   CALCIUM 9.6 06/04/2017 0948   ALKPHOS 82 12/13/2018 0746   ALKPHOS 84 06/04/2017 0948   AST 15 12/13/2018 0746   AST 24 06/04/2017 0948   ALT 17 12/13/2018 0746   ALT 35 06/04/2017 0948   BILITOT 0.8 12/13/2018 0746   BILITOT 0.82 06/04/2017 0948       RADIOGRAPHIC STUDIES: Ct Chest Wo Contrast  Result Date: 12/13/2018 CLINICAL DATA:  Followup left lung non-small-cell carcinoma. Ongoing immunotherapy. Restaging. Previous surgery, chemotherapy, and radiation therapy. EXAM: CT CHEST WITHOUT CONTRAST TECHNIQUE: Multidetector CT imaging of the chest was performed following the standard protocol without IV  contrast. COMPARISON:  06/14/2018 FINDINGS: Cardiovascular: No acute findings. Aortic and coronary artery atherosclerosis. Mediastinum/Nodes: No masses or pathologically enlarged lymph nodes identified on this unenhanced exam. Lungs/Pleura: Stable postop changes in left hemithorax. Postop scarring in posterior left upper lobe remains stable with associated nodular opacity measuring 9 mm on image 65/5, compared to 10 mm previously. A 6 mm pulmonary nodule is seen in the right lower lobe on image 97/5, increased from 4 mm on previous study. No other new or enlarging pulmonary nodules identified. Thickening of right minor fissure remains stable. Sub-cm ground-glass nodules in the right upper and middle lobes remains  stable. Mild-to-moderate centrilobular emphysema with bullous changes again noted. Upper Abdomen:  Unremarkable. Musculoskeletal:  No suspicious bone lesions. IMPRESSION: Increased size of 6 mm right lower lobe pulmonary nodule, suspicious for malignancy. Stable postop scarring and associated nodular opacity in posterior left upper lobe. Stable sub-cm right upper and middle lobe ground-glass nodules. No evidence of lymphadenopathy or pleural effusion. Aortic Atherosclerosis (ICD10-I70.0) and Emphysema (ICD10-J43.9). Coronary artery atherosclerosis. Electronically Signed   By: Earle Gell M.D.   On: 12/13/2018 08:37    ASSESSMENT AND PLAN:  This is a very pleasant 67 years old white male with metastatic non-small cell lung cancer, adenocarcinoma with positive EGFR mutation and he is currently on treatment with Tarceva since 2006. The patient has been tolerating this treatment well with no concerning adverse effects. He had repeat CT scan of the chest performed recently.  I personally and independently reviewed the scan images and discussed the results with the patient today.  His scan showed a stable disease except for a slight increase of right lower lobe pulmonary nodule that still suspicious for  malignancy but it sits below the detection level for a PET scan at this point. I recommended for the patient to continue with his current treatment with Tarceva with the same dose. I will see him back for follow-up visit in 6 months for evaluation with repeat CT scan of the chest for restaging of his disease. He was advised to call immediately if he has any concerning symptoms in the interval. I discussed the assessment and treatment plan with the patient. The patient was provided an opportunity to ask questions and all were answered. The patient agreed with the plan and demonstrated an understanding of the instructions.   The patient was advised to call back or seek an in-person evaluation if the symptoms worsen or if the condition fails to improve as anticipated.  I provided 11 minutes of non face-to-face telephone visit time during this encounter, and > 50% was spent counseling as documented under my assessment & plan.  Eilleen Kempf, MD 12/16/2018 8:28 AM  Disclaimer: This note was dictated with voice recognition software. Similar sounding words can inadvertently be transcribed and may not be corrected upon review.

## 2018-12-23 ENCOUNTER — Ambulatory Visit: Payer: Medicare Other | Admitting: Internal Medicine

## 2019-03-26 ENCOUNTER — Other Ambulatory Visit: Payer: Self-pay | Admitting: *Deleted

## 2019-03-26 DIAGNOSIS — C3490 Malignant neoplasm of unspecified part of unspecified bronchus or lung: Secondary | ICD-10-CM

## 2019-03-26 MED ORDER — TARCEVA 150 MG PO TABS: 150.0000 mg | ORAL_TABLET | Freq: Every day | ORAL | 3 refills | Status: DC

## 2019-04-07 ENCOUNTER — Ambulatory Visit (HOSPITAL_COMMUNITY)
Admission: RE | Admit: 2019-04-07 | Discharge: 2019-04-07 | Disposition: A | Discharge status: Home / Self Care | Payer: Medicare Other | Source: Ambulatory Visit | Attending: Cardiology | Admitting: Cardiology

## 2019-04-07 ENCOUNTER — Other Ambulatory Visit: Payer: Self-pay

## 2019-04-07 DIAGNOSIS — I714 Abdominal aortic aneurysm, without rupture, unspecified: Secondary | ICD-10-CM

## 2019-04-08 ENCOUNTER — Other Ambulatory Visit (HOSPITAL_COMMUNITY): Payer: Self-pay | Admitting: Cardiovascular Disease

## 2019-04-08 DIAGNOSIS — I714 Abdominal aortic aneurysm, without rupture, unspecified: Secondary | ICD-10-CM

## 2019-04-25 ENCOUNTER — Other Ambulatory Visit: Payer: Self-pay | Admitting: Medical Oncology

## 2019-04-25 DIAGNOSIS — C3490 Malignant neoplasm of unspecified part of unspecified bronchus or lung: Secondary | ICD-10-CM

## 2019-04-25 MED ORDER — TARCEVA 150 MG PO TABS: 150.0000 mg | ORAL_TABLET | Freq: Every day | ORAL | 1 refills | Status: DC

## 2019-05-29 ENCOUNTER — Telehealth: Payer: Self-pay | Admitting: Internal Medicine

## 2019-05-29 NOTE — Telephone Encounter (Signed)
Returned patient's phone call regarding rescheduling an appointment, informed patient why the appointments were scheduled how they were. Patient will keep appointment as is.

## 2019-06-13 ENCOUNTER — Telehealth: Payer: Self-pay | Admitting: *Deleted

## 2019-06-13 NOTE — Telephone Encounter (Signed)
Received call from pt stating that he has lab & Ct on Monday & MD appt on Wed.  He is wondering if he has to come in on Wed or can Dr Julien Nordmann call him with results.  He states he will come in if necessary.  Message routed to Dr Mohamed/pod RN

## 2019-06-16 ENCOUNTER — Ambulatory Visit (HOSPITAL_COMMUNITY)
Admission: RE | Admit: 2019-06-16 | Discharge: 2019-06-16 | Disposition: A | Discharge status: Home / Self Care | Payer: Medicare Other | Source: Ambulatory Visit | Attending: Internal Medicine | Admitting: Internal Medicine

## 2019-06-16 ENCOUNTER — Inpatient Hospital Stay: Payer: Medicare Other | Attending: Internal Medicine

## 2019-06-16 ENCOUNTER — Other Ambulatory Visit: Payer: Self-pay

## 2019-06-16 DIAGNOSIS — I10 Essential (primary) hypertension: Secondary | ICD-10-CM | POA: Insufficient documentation

## 2019-06-16 DIAGNOSIS — C3490 Malignant neoplasm of unspecified part of unspecified bronchus or lung: Secondary | ICD-10-CM | POA: Insufficient documentation

## 2019-06-16 DIAGNOSIS — Z79899 Other long term (current) drug therapy: Secondary | ICD-10-CM | POA: Diagnosis not present

## 2019-06-16 DIAGNOSIS — E119 Type 2 diabetes mellitus without complications: Secondary | ICD-10-CM | POA: Insufficient documentation

## 2019-06-16 LAB — CMP (CANCER CENTER ONLY)
ALT: 20 U/L (ref 0–44)
AST: 18 U/L (ref 15–41)
Albumin: 3.7 g/dL (ref 3.5–5.0)
Alkaline Phosphatase: 84 U/L (ref 38–126)
Anion gap: 10 (ref 5–15)
BUN: 15 mg/dL (ref 8–23)
CO2: 24 mmol/L (ref 22–32)
Calcium: 8.9 mg/dL (ref 8.9–10.3)
Chloride: 104 mmol/L (ref 98–111)
Creatinine: 1.7 mg/dL — ABNORMAL HIGH (ref 0.61–1.24)
GFR, Est AFR Am: 47 mL/min — ABNORMAL LOW (ref >60)
GFR, Estimated: 40 mL/min — ABNORMAL LOW (ref >60)
Glucose, Bld: 170 mg/dL — ABNORMAL HIGH (ref 70–99)
Potassium: 4.7 mmol/L (ref 3.5–5.1)
Sodium: 138 mmol/L (ref 135–145)
Total Bilirubin: 0.8 mg/dL (ref 0.3–1.2)
Total Protein: 7.2 g/dL (ref 6.5–8.1)

## 2019-06-16 LAB — CBC WITH DIFFERENTIAL (CANCER CENTER ONLY)
Abs Immature Granulocytes: 0.02 10*3/uL (ref 0.00–0.07)
Basophils Absolute: 0 10*3/uL (ref 0.0–0.1)
Basophils Relative: 1 %
Eosinophils Absolute: 0.2 10*3/uL (ref 0.0–0.5)
Eosinophils Relative: 2 %
HCT: 39.6 % (ref 39.0–52.0)
Hemoglobin: 13.5 g/dL (ref 13.0–17.0)
Immature Granulocytes: 0 %
Lymphocytes Relative: 27 %
Lymphs Abs: 2.1 10*3/uL (ref 0.7–4.0)
MCH: 29.4 pg (ref 26.0–34.0)
MCHC: 34.1 g/dL (ref 30.0–36.0)
MCV: 86.3 fL (ref 80.0–100.0)
Monocytes Absolute: 0.8 10*3/uL (ref 0.1–1.0)
Monocytes Relative: 10 %
Neutro Abs: 4.6 10*3/uL (ref 1.7–7.7)
Neutrophils Relative %: 60 %
Platelet Count: 234 10*3/uL (ref 150–400)
RBC: 4.59 MIL/uL (ref 4.22–5.81)
RDW: 14.7 % (ref 11.5–15.5)
WBC Count: 7.7 10*3/uL (ref 4.0–10.5)
nRBC: 0 % (ref 0.0–0.2)

## 2019-06-18 ENCOUNTER — Other Ambulatory Visit: Payer: Self-pay

## 2019-06-18 ENCOUNTER — Inpatient Hospital Stay: Payer: Medicare Other | Admitting: Physician Assistant

## 2019-06-18 ENCOUNTER — Encounter: Payer: Self-pay | Admitting: Physician Assistant

## 2019-06-18 VITALS — BP 150/81 | HR 87 | Temp 98.3°F | Resp 17 | Ht 73.0 in | Wt 212.9 lb

## 2019-06-18 DIAGNOSIS — C3412 Malignant neoplasm of upper lobe, left bronchus or lung: Secondary | ICD-10-CM | POA: Diagnosis not present

## 2019-06-18 DIAGNOSIS — C3490 Malignant neoplasm of unspecified part of unspecified bronchus or lung: Secondary | ICD-10-CM | POA: Diagnosis not present

## 2019-06-18 NOTE — Progress Notes (Signed)
[name]  [name], MD [address] [place] [address]  DIAGNOSIS: Metastatic non-small cell lung cancer, adenocarcinoma initially diagnosed in December of 2004  PRIOR THERAPY:  1) status post course of concurrent chemoradiation under the care of Dr. Truddie Coco. 2) status post left upper lobe wedge resection on 01/10/2005 for recurrent disease in the left lung.  CURRENT THERAPY: Tarceva 150 mg by mouth daily started in 2006.  INTERVAL HISTORY: [name] 68 y.o. male returns to the clinic for a follow up visit. The patient is feeling well today without any concerning complaints. He recently had bilateral cataract surgery. He states he get a rare mild headache secondry to getting adjusted to his new vision. The patient continues to tolerate treatment with oral chemotherapy with Tarceva well without any adverse side effects. He has been on Tarceva since 2006. Denies any fever, chills, night sweats, or weight loss. Denies any chest pain, shortness of breath, cough, or hemoptysis. Denies any nausea, vomiting, diarrhea, or constipation. Denies any rashes or skin changes. The patient recently had a restaging CT scan performed. The patient is here today for evaluation and to review his scan results.    MEDICAL HISTORY: Past Medical History:  Diagnosis Date  . AAA (abdominal aortic aneurysm) (HCC)    a. 3.4 x 3.5 on scan 02/28/13.  . Allergic reaction to contrast dye   . Atherosclerosis   . CAD (coronary artery disease)    a. stenting of RCA/LCx 2004. b. [place] 08/2013:  s/p DES to LCx for severe ISR.  . Diabetes mellitus   . Diverticulosis   . Emphysema lung ([place])   . Emphysema of lung ([place])   . Encounter for therapeutic drug monitoring 06/06/2016  . Hemorrhoids   . Hepatic steatosis   . HTN (hypertension)   . Hyperlipidemia   . Kidney stone   . Lung cancer ([place]) dx'd 2004   chemo/xrt comp; tarceva ongoing    ALLERGIES:  is  allergic to iohexol; morphine and related; and other.  MEDICATIONS:  Current Outpatient Medications  Medication Sig Dispense Refill  . sitaGLIPtin (JANUVIA) 100 MG tablet Take 100 mg by mouth daily.    Marland Kitchen amLODipine (NORVASC) 2.5 MG tablet Take 2.5 mg by mouth daily.  5  . aspirin 81 MG chewable tablet Chew 81 mg by mouth daily.    Marland Kitchen atorvastatin (LIPITOR) 40 MG tablet Take 40 mg by mouth daily.    . clopidogrel (PLAVIX) 75 MG tablet TAKE 1 TABLET (75 MG TOTAL) BY MOUTH DAILY. 90 tablet 2  . glimepiride (AMARYL) 4 MG tablet Take 4 mg by mouth 2 (two) times daily.    . isosorbide mononitrate (IMDUR) 30 MG 24 hr tablet TAKE 1 TABLET BY MOUTH ONCE DAILY 90 tablet 1  . Loperamide HCl (IMODIUM A-D) 1 MG/7.5ML LIQD Take 7.5 mLs by mouth as directed.     Marland Kitchen losartan (COZAAR) 100 MG tablet TAKE ONE TABLET BY MOUTH EVERYDAY  5  . metoprolol (LOPRESSOR) 100 MG tablet Take 100 mg by mouth daily. Take one tablet by mouth daily with or following a meal  4  . nitroGLYCERIN (NITROSTAT) 0.4 MG SL tablet Place 1 tablet (0.4 mg total) under the tongue every 5 (five) minutes as needed for chest pain (up to 3 doses). 25 tablet 3  . omeprazole (PRILOSEC) 20 MG capsule Take 20 mg by mouth 2 (two) times daily.  5  . TARCEVA 150 MG tablet  Take 1 tablet (150 mg total) by mouth daily. Take on an empty stomach 1 hour before meals or 2 hours after. 30 tablet 1  . zaleplon (SONATA) 10 MG capsule Take 10 mg by mouth at bedtime.  5   No current facility-administered medications for this visit.     SURGICAL HISTORY:  Past Surgical History:  Procedure Laterality Date  . ANGIOPLASTY / STENTING FEMORAL  2015   2004-groin  . LEFT HEART CATHETERIZATION WITH CORONARY ANGIOGRAM N/A 09/08/2013   Procedure: LEFT HEART CATHETERIZATION WITH CORONARY ANGIOGRAM;  Surgeon: Blane Ohara, MD;  Location[facility];  Service: Cardiovascular;  Laterality: N/A;  . left partial lobectomy    . WISDOM TOOTH EXTRACTION      REVIEW OF  SYSTEMS:   Review of Systems  Constitutional: Negative for appetite change, chills, fatigue, fever and unexpected weight change.  HENT: Negative for mouth sores, nosebleeds, sore throat and trouble swallowing.   Eyes: Negative for eye problems and icterus.  Respiratory: Negative for cough, hemoptysis, shortness of breath and wheezing.   Cardiovascular: Negative for chest pain and leg swelling.  Gastrointestinal: Negative for abdominal pain, constipation, diarrhea, nausea and vomiting.  Genitourinary: Negative for bladder incontinence, difficulty urinating, dysuria, frequency and hematuria.   Musculoskeletal: Negative for back pain, gait problem, neck pain and neck stiffness.  Skin: Negative for itching and rash.  Neurological: Positive for rare headache. Negative for dizziness, extremity weakness, gait problem, light-headedness and seizures.  Hematological: Negative for adenopathy. Does not bruise/bleed easily.  Psychiatric/Behavioral: Negative for confusion, depression and sleep disturbance. The patient is not nervous/anxious.     PHYSICAL EXAMINATION:  Blood pressure (!) 150/81, pulse 87, temperature 98.3 F (36.8 C), temperature source Temporal, resp. rate 17, height 6' 1"  (1.854 m), weight 212 lb 14.4 oz (96.6 kg), SpO2 100 %.  ECOG PERFORMANCE STATUS: 1 - Symptomatic but completely ambulatory  Physical Exam  Constitutional: Oriented to person, place, and time and well-developed, well-nourished, and in no distress.  HENT:  Head: Normocephalic and atraumatic.  Mouth/Throat: Oropharynx is clear and moist. No oropharyngeal exudate.  Eyes: Conjunctivae are normal. Right eye exhibits no discharge. Left eye exhibits no discharge. No scleral icterus.  Neck: Normal range of motion. Neck supple.  Cardiovascular: Normal rate, regular rhythm, normal heart sounds and intact distal pulses.   Pulmonary/Chest: Effort normal. Quiet breath sounds in all lung fields. No respiratory distress. No  wheezes. No rales.  Abdominal: Soft. Bowel sounds are normal. Exhibits no distension and no mass. There is no tenderness.  Musculoskeletal: Normal range of motion. Exhibits no edema.  Lymphadenopathy:    No cervical adenopathy.  Neurological: Alert and oriented to person, place, and time. Exhibits normal muscle tone. Gait normal. Coordination normal.  Skin: Skin is warm and dry. No rash noted. Not diaphoretic. No erythema. No pallor.  Psychiatric: Mood, memory and judgment normal.  Vitals reviewed.  LABORATORY DATA: Lab Results  Component Value Date   WBC 7.7 06/16/2019   HGB 13.5 06/16/2019   HCT 39.6 06/16/2019   MCV 86.3 06/16/2019   PLT 234 06/16/2019      Chemistry      Component Value Date/Time   NA 138 06/16/2019 0813   NA 139 06/04/2017 0948   K 4.7 06/16/2019 0813   K 5.1 06/04/2017 0948   CL 104 06/16/2019 0813   CL 106 06/10/2012 0900   CO2 24 06/16/2019 0813   CO2 26 06/04/2017 0948   BUN 15 06/16/2019 0813  BUN 14.2 06/04/2017 0948   CREATININE 1.70 (H) 06/16/2019 0813   CREATININE 1.5 (H) 06/04/2017 0948      Component Value Date/Time   CALCIUM 8.9 06/16/2019 0813   CALCIUM 9.6 06/04/2017 0948   ALKPHOS 84 06/16/2019 0813   ALKPHOS 84 06/04/2017 0948   AST 18 06/16/2019 0813   AST 24 06/04/2017 0948   ALT 20 06/16/2019 0813   ALT 35 06/04/2017 0948   BILITOT 0.8 06/16/2019 0813   BILITOT 0.82 06/04/2017 0948       RADIOGRAPHIC STUDIES:  Ct Chest Wo Contrast  Result Date: 06/16/2019 CLINICAL DATA:  Non-small cell lung cancer staging EXAM: CT CHEST WITHOUT CONTRAST TECHNIQUE: Multidetector CT imaging of the chest was performed following the standard protocol without IV contrast. COMPARISON:  12/13/2018, 06/14/2018 FINDINGS: Cardiovascular: Aortic atherosclerosis. Dense aortic valve calcifications. Normal heart size. Extensive 3 vessel coronary artery calcifications. No pericardial effusion. Mediastinum/Nodes: No enlarged mediastinal, hilar, or  axillary lymph nodes. Thyroid gland, trachea, and esophagus demonstrate no significant findings. Lungs/Pleura: Moderate centrilobular emphysema. Stable postoperative findings of left upper lobe wedge resection. Stable right lower lobe pulmonary nodule measuring 6 mm (series 7, image 83). Stable, likely benign infectious or inflammatory ground-glass opacity of the medial right upper lobe adjacent to the aorta (series 7 image 50). No pleural effusion or pneumothorax. Upper Abdomen: No acute abnormality. Tiny nonobstructive renal calculi and/or vascular calcifications in the upper poles of the included kidneys. Musculoskeletal: No chest wall mass or suspicious bone lesions identified. IMPRESSION: 1. Stable postoperative findings of left upper lobe wedge resection. No evidence of recurrent mass or lymphadenopathy. 2. Stable right lower lobe pulmonary nodule measuring 6 mm (series 7, image 83), which remains suspicious for malignancy given enlargement from examination dated 06/14/2018. 3.  Emphysema (ICD10-J43.9). 4.  Coronary artery disease. 5. Aortic Atherosclerosis (ICD10-I70.0). Aortic valve calcifications. Electronically Signed   By: Eddie Candle M.D.   On: 06/16/2019 09:36     ASSESSMENT/PLAN:  This is a very pleasant 68 years old Caucasian male with metastatic non-small cell lung cancer, adenocarcinoma with positive EGFR mutation and he is currently on treatment with Tarceva since 2006.  The patient has been tolerating treatment well without any adverse side effects.   He recently had a restaging CT scan performed. Dr. Julien Nordmann personally and independently reviewed the scan and discussed the results with the patient. The scan showed stable disease. The 6 mm nodule which is slightly enlarged when compared to 06/14/2018 is stable compared to 6 months ago. This nodule is still suspicious for malignancy but it sits below the detection level for a PET scan at this point. We will continue to monitor this nodule  closely and would consider a referral to radiation oncology if the nodule enlarges.   Dr. Julien Nordmann recommends that the patient continue on his current treatment with Tarceva with the same dose.    We will see the patient back for a follow up visit in 6 months for evaluation with a repeat CT scan of the chest for restaging of his disease.    The patient was advised to call immediately if he has any concerning symptoms in the interval. The patient voices understanding of current disease status and treatment options and is in agreement with the current care plan. All questions were answered. The patient knows to call the clinic with any problems, questions or concerns. We can certainly see the patient much sooner if necessary   Orders Placed This Encounter  Procedures  . CT  Chest Wo Contrast    Standing Status:   Future    Standing Expiration Date:   06/17/2020    Order Specific Question:   ** REASON FOR EXAM (FREE TEXT)    Answer:   Restaging Lung Cancer    Order Specific Question:   Preferred imaging location?    Answer:   [facility]    Order Specific Question:   Radiology Contrast Protocol - do NOT remove file path    Answer:   [email or web address]  . [place] ([place] only)    Standing Status:   Future    Standing Expiration Date:   06/17/2020  . CBC with Differential (Cancer Center Only)    Standing Status:   Future    Standing Expiration Date:   06/17/2020     Tobe Sos Gerry Blanchfield, PA-C 06/18/19  ADDENDUM: Hematology/Oncology Attending: I had a face-to-face encounter with the patient today.  I recommended his care plan.  This is a very pleasant 68 years old white male with stage IV non-small cell lung cancer, adenocarcinoma and has been on treatment with Tarceva since 2006.  The patient has been tolerating this treatment well with no concerning adverse effects. He had repeat CT scan of the chest performed recently.  I personally and  independently reviewed the scan and discussed the results with the patient today. Has a scan showed no concerning findings for disease progression and there was a stable right lower lobe pulmonary nodule measuring 6 mm in size. I recommended for the patient to continue his current treatment with Tarceva with the same dose. I will see him back for follow-up visit in 6 months with repeat CT scan of the chest for restaging of his disease and close monitoring of the right lower lobe pulmonary nodule. The patient was advised to call immediately if he has any concerning symptoms in the interval.  Disclaimer: This [name] was dictated with voice recognition software. Similar sounding words can inadvertently be transcribed and may be missed upon review. Eilleen Kempf, MD 06/18/19

## 2019-06-19 ENCOUNTER — Telehealth: Payer: Self-pay | Admitting: Physician Assistant

## 2019-06-19 NOTE — Telephone Encounter (Signed)
Scheduled per los. Called and spoke with patient. Confirmed appt 

## 2019-06-25 ENCOUNTER — Other Ambulatory Visit: Payer: Self-pay

## 2019-06-25 DIAGNOSIS — C3490 Malignant neoplasm of unspecified part of unspecified bronchus or lung: Secondary | ICD-10-CM

## 2019-06-25 MED ORDER — TARCEVA 150 MG PO TABS: 150.0000 mg | ORAL_TABLET | Freq: Every day | ORAL | 3 refills | Status: DC

## 2019-07-07 ENCOUNTER — Other Ambulatory Visit: Payer: Self-pay

## 2019-07-07 ENCOUNTER — Encounter: Payer: Self-pay | Admitting: Cardiovascular Disease

## 2019-07-07 ENCOUNTER — Ambulatory Visit: Payer: Medicare Other | Admitting: Cardiovascular Disease

## 2019-07-07 VITALS — BP 150/86 | HR 68 | Ht 73.0 in | Wt 215.1 lb

## 2019-07-07 DIAGNOSIS — I714 Abdominal aortic aneurysm, without rupture, unspecified: Secondary | ICD-10-CM

## 2019-07-07 DIAGNOSIS — I10 Essential (primary) hypertension: Secondary | ICD-10-CM | POA: Diagnosis not present

## 2019-07-07 DIAGNOSIS — E1122 Type 2 diabetes mellitus with diabetic chronic kidney disease: Secondary | ICD-10-CM

## 2019-07-07 DIAGNOSIS — E782 Mixed hyperlipidemia: Secondary | ICD-10-CM

## 2019-07-07 DIAGNOSIS — I209 Angina pectoris, unspecified: Secondary | ICD-10-CM

## 2019-07-07 DIAGNOSIS — N183 Chronic kidney disease, stage 3 unspecified: Secondary | ICD-10-CM

## 2019-07-07 MED ORDER — AMLODIPINE BESYLATE 5 MG PO TABS: 5.0000 mg | ORAL_TABLET | Freq: Every day | ORAL | 3 refills | Status: DC

## 2019-07-07 NOTE — Patient Instructions (Signed)
Medication Instructions:  1) INCREASE AMLODIPINE to 5 mg daily *If you need a refill on your cardiac medications before your next appointment, please call your pharmacy*   Follow-Up: At [facility], you and your health needs are our priority.  As part of our continuing mission to provide you with exceptional heart care, we have created designated Provider Care Teams.  These Care Teams include your primary Cardiologist (physician) and Advanced Practice Providers (APPs -  Physician Assistants and Nurse Practitioners) who all work together to provide you with the care you need, when you need it. Your next appointment:   12 month(s) The format for your next appointment:   In Person Provider:   You may see Sherren Mocha, MD or one of the following Advanced Practice Providers on your designated Care Team:    Richardson Dopp, PA-C  Vin Stoughton, [place]  Daune Perch, [place]

## 2019-07-07 NOTE — Progress Notes (Signed)
Cardiology Office Note:    Date:  07/07/2019   ID:  [name], DOB [birth year], MRN [id number]  PCP:  [name], MD  Cardiologist:  Sherren Mocha, MD  Electrophysiologist:  None   Referring MD: [name], MD   Chief Complaint  Patient presents with  . Coronary Artery Disease    History of Present Illness:    [name] is a 68 y.o. male with a hx of coronary artery disease, presenting for follow-up evaluation.  The patient initially presented in 2004 and he underwent PCI of the right coronary artery and left circumflex at that time.  He had crescendo angina in 2015 and was found to have severe in-stent restenosis of the circumflex, again treated with stenting using a DES.  The patient is here alone today. He has occasional chest discomfort if he is doing more vigorous exercise, otherwise he is asymptomatic from a cardiac perspective. He remembers a time when his dog was pulling him and he had chest discomfort and shortness of breath. He otherwise has had no recent symptoms. No orthopnea or PND. He has a small lung lesion concerning for recurrent malignancy but it is noted to be too small for PET scanning and there are plans for 6 months follow-up and consideration of radiation if it is enlarging.   Past Medical History:  Diagnosis Date  . AAA (abdominal aortic aneurysm) (HCC)    a. 3.4 x 3.5 on scan 02/28/13.  . Allergic reaction to contrast dye   . Atherosclerosis   . CAD (coronary artery disease)    a. stenting of RCA/LCx 2004. b. [place] 08/2013:  s/p DES to LCx for severe ISR.  . Diabetes mellitus   . Diverticulosis   . Emphysema lung ([place])   . Emphysema of lung ([place])   . Encounter for therapeutic drug monitoring 06/06/2016  . Hemorrhoids   . Hepatic steatosis   . HTN (hypertension)   . Hyperlipidemia   . Kidney stone   . Lung cancer ([place]) dx'd 2004   chemo/xrt comp; tarceva ongoing    Past Surgical History:  Procedure Laterality Date  . ANGIOPLASTY /  STENTING FEMORAL  2015   2004-groin  . LEFT HEART CATHETERIZATION WITH CORONARY ANGIOGRAM N/A 09/08/2013   Procedure: LEFT HEART CATHETERIZATION WITH CORONARY ANGIOGRAM;  Surgeon: Blane Ohara, MD;  Location: [facility] CATH LAB;  Service: Cardiovascular;  Laterality: N/A;  . left partial lobectomy    . WISDOM TOOTH EXTRACTION      Current Medications: Current Meds  Medication Sig  . amLODipine (NORVASC) 5 MG tablet Take 1 tablet (5 mg total) by mouth daily.  Marland Kitchen aspirin 81 MG chewable tablet Chew 81 mg by mouth daily.  Marland Kitchen atorvastatin (LIPITOR) 40 MG tablet Take 40 mg by mouth daily.  . clopidogrel (PLAVIX) 75 MG tablet TAKE 1 TABLET (75 MG TOTAL) BY MOUTH DAILY.  Marland Kitchen glimepiride (AMARYL) 4 MG tablet Take 4 mg by mouth 2 (two) times daily.  . isosorbide mononitrate (IMDUR) 30 MG 24 hr tablet TAKE 1 TABLET BY MOUTH ONCE DAILY  . Loperamide HCl (IMODIUM A-D) 1 MG/7.5ML LIQD Take 7.5 mLs by mouth as directed.   Marland Kitchen losartan (COZAAR) 100 MG tablet TAKE ONE TABLET BY MOUTH EVERYDAY  . metoprolol (LOPRESSOR) 100 MG tablet Take 100 mg by mouth daily. Take one tablet by mouth daily with or following a meal  . nitroGLYCERIN (NITROSTAT) 0.4 MG SL tablet Place 1 tablet (0.4 mg total) under the tongue  every 5 (five) minutes as needed for chest pain (up to 3 doses).  Marland Kitchen omeprazole (PRILOSEC) 20 MG capsule Take 20 mg by mouth 2 (two) times daily.  . sitaGLIPtin (JANUVIA) 100 MG tablet Take 100 mg by mouth daily.  Marland Kitchen TARCEVA 150 MG tablet Take 1 tablet (150 mg total) by mouth daily. Take on an empty stomach 1 hour before meals or 2 hours after.  . zaleplon (SONATA) 10 MG capsule Take 10 mg by mouth at bedtime.  . [DISCONTINUED] amLODipine (NORVASC) 2.5 MG tablet Take 2.5 mg by mouth daily.     Allergies:   Iohexol, Morphine and related, and Other   Social History   Socioeconomic History  . Marital status: Married    Spouse name: Not on file  . Number of children: 1  . Years of education: Not on file  .  Highest education level: Not on file  Occupational History  . Occupation: Retired-disability  Social Needs  . Financial resource strain: Not on file  . Food insecurity    Worry: Not on file    Inability: Not on file  . Transportation needs    Medical: Not on file    Non-medical: Not on file  Tobacco Use  . Smoking status: Former Smoker    Quit date: 08/14/2002    Years since quitting: 16.9  . Smokeless tobacco: Never Used  Substance and Sexual Activity  . Alcohol use: No  . Drug use: No  . Sexual activity: Not on file  Lifestyle  . Physical activity    Days per week: Not on file    Minutes per session: Not on file  . Stress: Not on file  Relationships  . Social Herbalist on phone: Not on file    Gets together: Not on file    Attends religious service: Not on file    Active member of club or organization: Not on file    Attends meetings of clubs or organizations: Not on file    Relationship status: Not on file  Other Topics Concern  . Not on file  Social History Narrative  . Not on file     Family History: The patient's family history includes Diabetes in his sister; Heart disease in his sister; Ovarian cancer in his sister. There is no history of Colon cancer, Esophageal cancer, Rectal cancer, or Stomach cancer.  ROS:   Please see the history of present illness.    All other systems reviewed and are negative.  EKGs/Labs/Other Studies Reviewed:    The following studies were reviewed today: AAA Duplex 04-07-2019: Summary: Abdominal Aorta: There is evidence of abnormal dilatation of the distal Abdominal aorta. The largest aortic measurement is 3.8 cm. The largest aortic diameter remains essentially unchanged compared to prior exam. Previous diameter measurement was 3.8 cm  obtained on 04/05/2018.  Stenosis: +-------------------+-------------+-------------+ Location           Stenosis     Comments      +-------------------+-------------+-------------+  Right Common Iliac >50% stenosislow end range +-------------------+-------------+-------------+ Left Common Iliac  >50% stenosislow end range +-------------------+-------------+-------------+ Left External Iliac>50% stenosislow end range +-------------------+-------------+-------------+  Patent IVC.   EKG:  EKG is ordered today.  The ekg ordered today demonstrates NSr, age-indeterminate inferior infarct, HR 68 bpm, no significant change from previous tracings  Recent Labs: 06/16/2019: ALT 20; BUN 15; Creatinine 1.70; Hemoglobin 13.5; Platelet Count 234; Potassium 4.7; Sodium 138  Recent Lipid Panel No results found for: CHOL, TRIG, HDL,  CHOLHDL, VLDL, LDLCALC, LDLDIRECT  Physical Exam:    VS:  BP (!) 150/86   Pulse 68   Ht 6\' 1"  (1.854 m)   Wt 215 lb 1.9 oz (97.6 kg)   SpO2 98%   BMI 28.38 kg/m     Wt Readings from Last 3 Encounters:  07/07/19 215 lb 1.9 oz (97.6 kg)  06/18/19 212 lb 14.4 oz (96.6 kg)  06/17/18 211 lb 14.4 oz (96.1 kg)     GEN:  Well nourished, well developed in no acute distress HEENT: Normal NECK: No JVD; No carotid bruits LYMPHATICS: No lymphadenopathy CARDIAC: RRR, no murmurs, rubs, gallops RESPIRATORY:  Clear to auscultation without rales, wheezing or rhonchi  ABDOMEN: Soft, non-tender, non-distended MUSCULOSKELETAL:  No edema; No deformity  SKIN: Warm and dry NEUROLOGIC:  Alert and oriented x 3 PSYCHIATRIC:  Normal affect   ASSESSMENT:    1. Essential hypertension   2. Angina, class [name] ([place])   3. Mixed hyperlipidemia   4. AAA (abdominal aortic aneurysm) without rupture ([place])   5. CKD stage 3 secondary to diabetes ([place])    PLAN:    In order of problems listed above:  1. BP control suboptimal. I recommended increasing his amlodipine to 5 mg daily. Continue losartan. 2. Stable symptoms. Continue DAPT with ASA and clopidogrel (recurrent ACS, 1st generation DES). 3. Treated with atorvastatin, lipids followed by PCP.  4. Most  recent abdominal duplex reviewed demonstrating stable 3.8 cm AAA. Continue serial imaging as recommended.  5. We discussed importance of adequate fluid, avoidance of NSAID's, optimization of BP, and use of an ARB.   Medication Adjustments/Labs and Tests Ordered: Current medicines are reviewed at length with the patient today.  Concerns regarding medicines are outlined above.  Orders Placed This Encounter  Procedures  . EKG 12-Lead   Meds ordered this encounter  Medications  . amLODipine (NORVASC) 5 MG tablet    Sig: Take 1 tablet (5 mg total) by mouth daily.    Dispense:  90 tablet    Refill:  3    Patient Instructions  Medication Instructions:  1) INCREASE AMLODIPINE to 5 mg daily *If you need a refill on your cardiac medications before your next appointment, please call your pharmacy*   Follow-Up: At [facility], you and your health needs are our priority.  As part of our continuing mission to provide you with exceptional heart care, we have created designated Provider Care Teams.  These Care Teams include your primary Cardiologist (physician) and Advanced Practice Providers (APPs -  Physician Assistants and Nurse Practitioners) who all work together to provide you with the care you need, when you need it. Your next appointment:   12 month(s) The format for your next appointment:   In Person Provider:   You may see Sherren Mocha, MD or one of the following Advanced Practice Providers on your designated Care Team:    Richardson Dopp, PA-C  Vin Chapman, PA-C  Daune Perch, [place]     Signed, Sherren Mocha, MD  07/07/2019 4:19 PM    [place]

## 2019-07-25 ENCOUNTER — Encounter

## 2019-07-25 ENCOUNTER — Ambulatory Visit: Payer: Medicare Other | Admitting: Cardiovascular Disease

## 2019-08-28 ENCOUNTER — Other Ambulatory Visit: Payer: Self-pay | Admitting: Medical Oncology

## 2019-08-28 DIAGNOSIS — C3490 Malignant neoplasm of unspecified part of unspecified bronchus or lung: Secondary | ICD-10-CM

## 2019-08-28 MED ORDER — ERLOTINIB HCL 150 MG PO TABS: 150.0000 mg | ORAL_TABLET | Freq: Every day | ORAL | 2 refills | Status: DC

## 2019-08-29 NOTE — Progress Notes (Signed)
Pt notified that Dr Julien Nordmann said he can take the generic Tarceva. Refill sent yesterday.

## 2019-09-23 DIAGNOSIS — I10 Essential (primary) hypertension: Secondary | ICD-10-CM | POA: Diagnosis present

## 2019-09-23 DIAGNOSIS — C7951 Secondary malignant neoplasm of bone: Secondary | ICD-10-CM | POA: Insufficient documentation

## 2019-09-23 DIAGNOSIS — G4700 Insomnia, unspecified: Secondary | ICD-10-CM | POA: Insufficient documentation

## 2019-09-23 DIAGNOSIS — E1169 Type 2 diabetes mellitus with other specified complication: Secondary | ICD-10-CM | POA: Insufficient documentation

## 2019-09-23 DIAGNOSIS — E785 Hyperlipidemia, unspecified: Secondary | ICD-10-CM | POA: Insufficient documentation

## 2019-09-23 DIAGNOSIS — N1832 Chronic kidney disease, stage 3b: Secondary | ICD-10-CM | POA: Diagnosis present

## 2019-09-23 DIAGNOSIS — I714 Abdominal aortic aneurysm, without rupture, unspecified: Secondary | ICD-10-CM | POA: Insufficient documentation

## 2019-09-23 DIAGNOSIS — K219 Gastro-esophageal reflux disease without esophagitis: Secondary | ICD-10-CM | POA: Insufficient documentation

## 2019-09-23 DIAGNOSIS — C3492 Malignant neoplasm of unspecified part of left bronchus or lung: Secondary | ICD-10-CM | POA: Insufficient documentation

## 2019-09-23 DIAGNOSIS — I2510 Atherosclerotic heart disease of native coronary artery without angina pectoris: Secondary | ICD-10-CM | POA: Insufficient documentation

## 2019-09-23 DIAGNOSIS — K7581 Nonalcoholic steatohepatitis (NASH): Secondary | ICD-10-CM | POA: Insufficient documentation

## 2019-09-23 DIAGNOSIS — Z87442 Personal history of urinary calculi: Secondary | ICD-10-CM | POA: Insufficient documentation

## 2019-10-05 ENCOUNTER — Ambulatory Visit: Payer: Medicare Other | Attending: Internal Medicine

## 2019-10-05 DIAGNOSIS — Z23 Encounter for immunization: Secondary | ICD-10-CM | POA: Insufficient documentation

## 2019-10-05 NOTE — Progress Notes (Signed)
   Covid-19 Vaccination Clinic  Name:  [name]    MRN: [id number] DOB: 1951-02-03  10/05/2019  [name] was observed post Covid-19 immunization for 15 minutes without incidence. He was provided with Vaccine Information Sheet and instruction to access the V-Safe system.   [name] was instructed to call 911 with any severe reactions post vaccine: Marland Kitchen Difficulty breathing  . Swelling of your face and throat  . A fast heartbeat  . A bad rash all over your body  . Dizziness and weakness    Immunizations Administered    Name Date Dose VIS Date Route   Pfizer COVID-19 Vaccine 10/05/2019  8:31 AM 0.3 mL 07/25/2019 Intramuscular   Manufacturer: [place]   Lot: [id number]   [place]: [address]-5

## 2019-10-06 ENCOUNTER — Other Ambulatory Visit: Payer: Self-pay | Admitting: Medical Oncology

## 2019-10-06 DIAGNOSIS — C3490 Malignant neoplasm of unspecified part of unspecified bronchus or lung: Secondary | ICD-10-CM

## 2019-10-15 ENCOUNTER — Encounter: Payer: Self-pay | Admitting: Cardiovascular Disease

## 2019-10-15 NOTE — Telephone Encounter (Signed)
error 

## 2019-10-28 ENCOUNTER — Ambulatory Visit: Payer: Medicare Other | Attending: Internal Medicine

## 2019-10-28 DIAGNOSIS — Z23 Encounter for immunization: Secondary | ICD-10-CM

## 2019-10-28 NOTE — Progress Notes (Signed)
   Covid-19 Vaccination Clinic  Name:  [name]    MRN: [id number] DOB: December 04, 1950  10/28/2019  [name] was observed post Covid-19 immunization for 15 minutes without incident. He was provided with Vaccine Information Sheet and instruction to access the V-Safe system.   [name] was instructed to call 911 with any severe reactions post vaccine: Marland Kitchen Difficulty breathing  . Swelling of face and throat  . A fast heartbeat  . A bad rash all over body  . Dizziness and weakness   Immunizations Administered    Name Date Dose VIS Date Route   Pfizer COVID-19 Vaccine 10/28/2019 12:29 PM 0.3 mL 07/25/2019 Intramuscular   Manufacturer: [place]   Lot: [id number]   [place]: [id number]

## 2019-11-02 IMAGING — CT CT CHEST WITHOUT CONTRAST
2 of 3 series · 15 of 36 positions shown, 18 images · non-contrast
Comparison: 06/14/2018

CLINICAL DATA: Followup left lung non-small-cell carcinoma. Ongoing
immunotherapy. Restaging. Previous surgery, chemotherapy, and
radiation therapy.

EXAM:
CT CHEST WITHOUT CONTRAST
TECHNIQUE: Multidetector CT imaging of the chest was performed following the
standard protocol without IV contrast.

[Series 2: thorax · axial · 0.82mm/px · z∈[-375,-77]mm · 12 of 177 slices shown, 15 images]
[im 14/177  mediastinal]
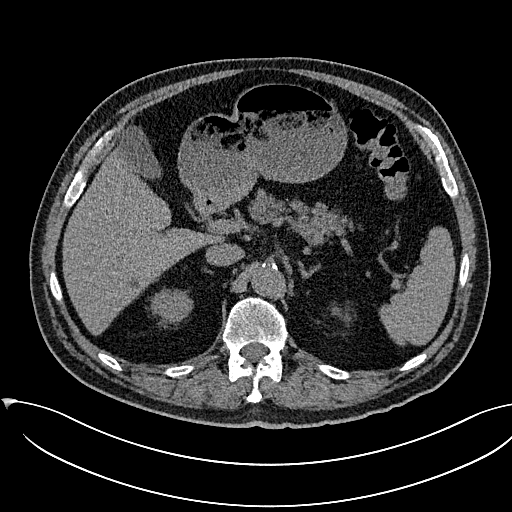
[im 14/177  lung]
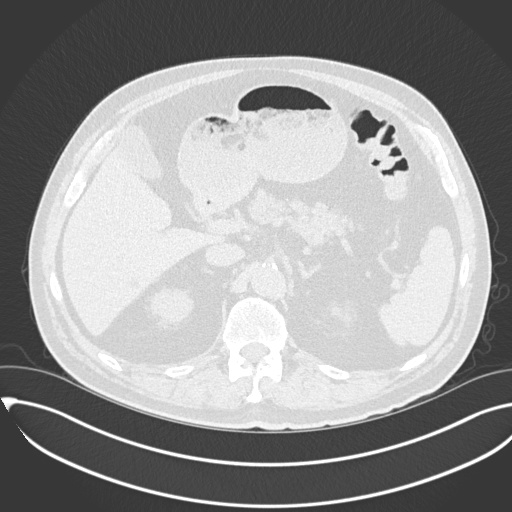
[im 27/177  lung]
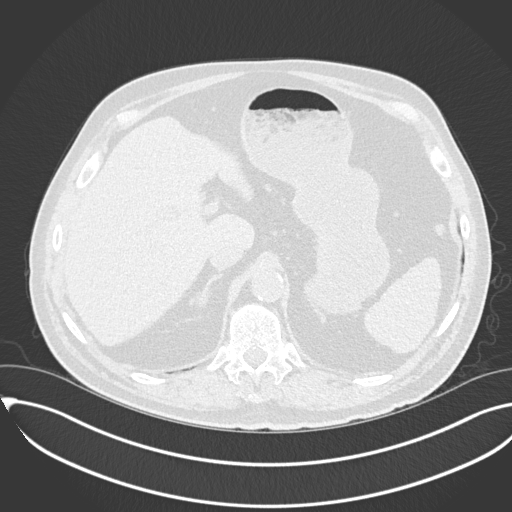
[im 40/177  lung]
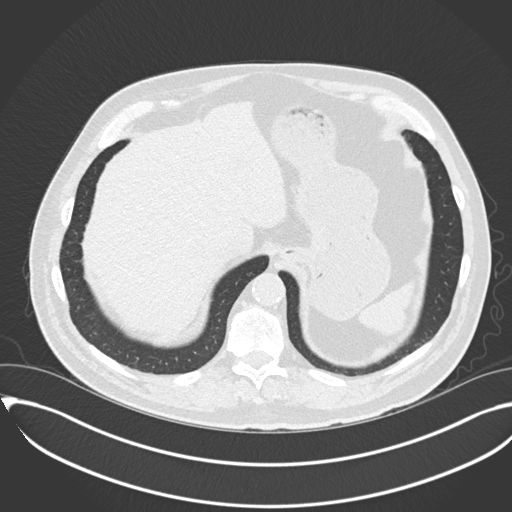
[im 53/177  lung]
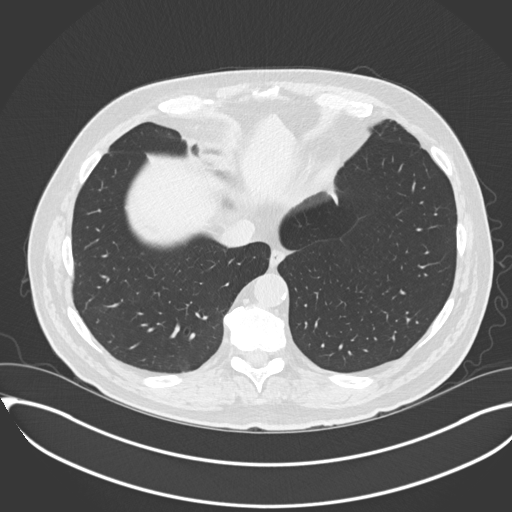
[im 66/177  mediastinal]
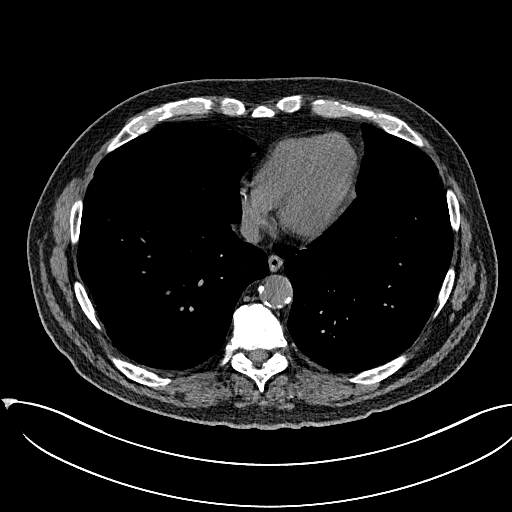
[im 66/177  lung]
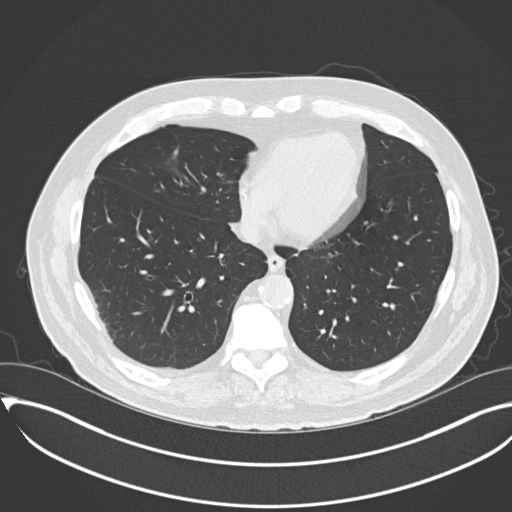
[im 79/177  lung]
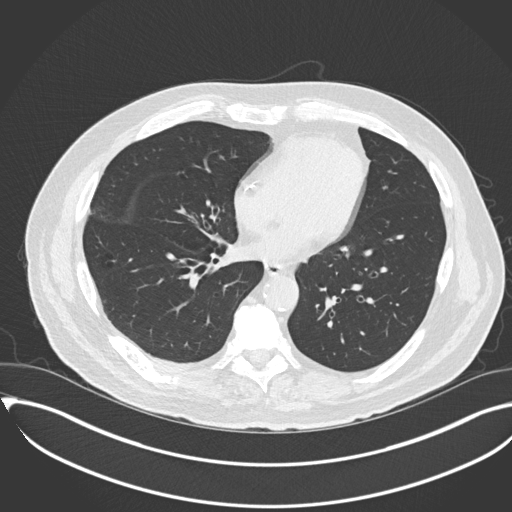
[im 98/177  lung]
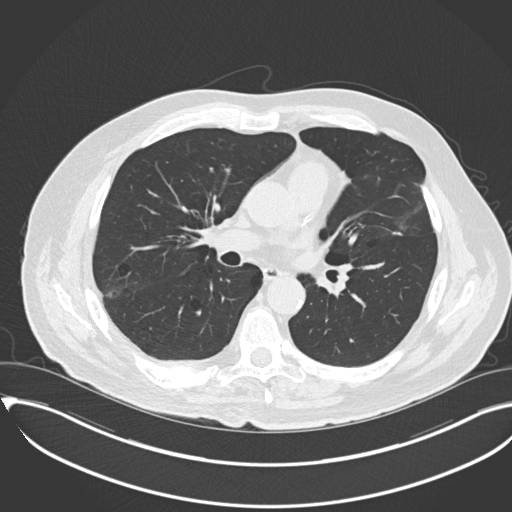
[im 111/177  lung]
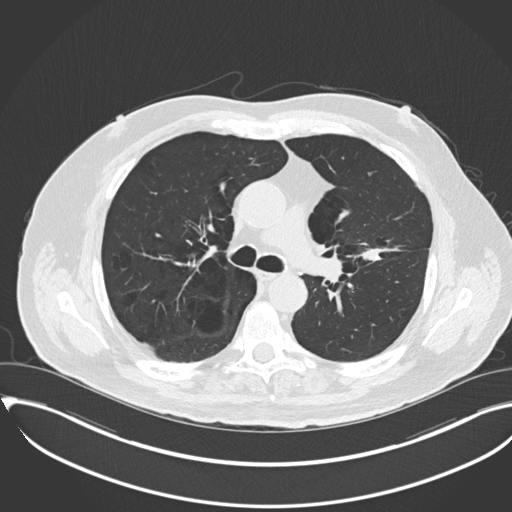
[im 124/177  mediastinal]
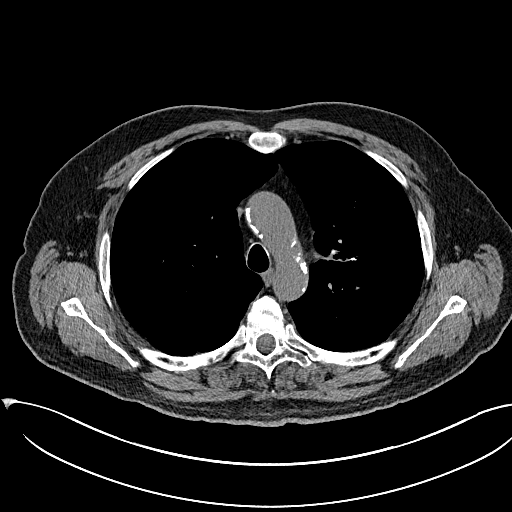
[im 124/177  lung]
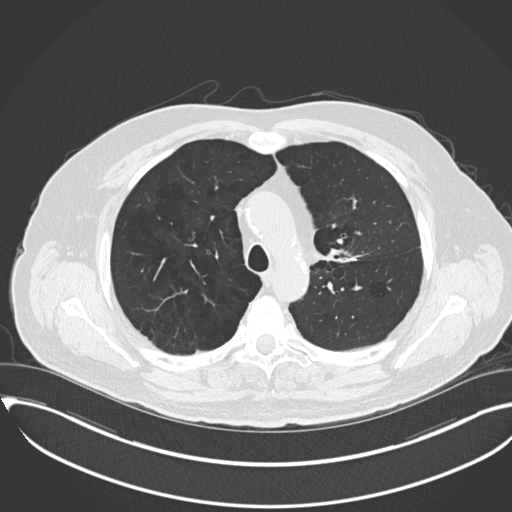
[im 137/177  lung]
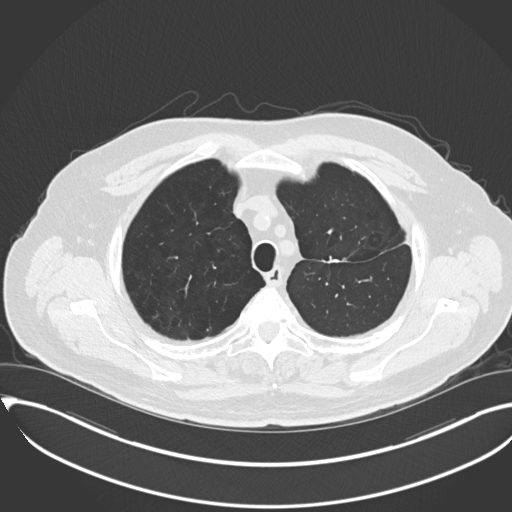
[im 150/177  lung]
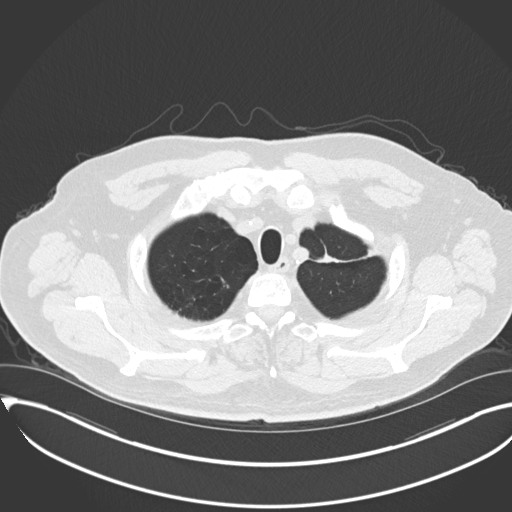
[im 163/177  lung]
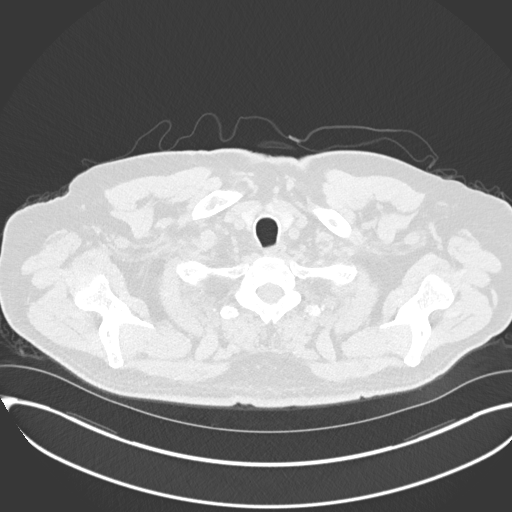

[Series 6: coronal · coronal · 0.69mm/px · 3 of 163 slices shown]
[im 33/163  lung]
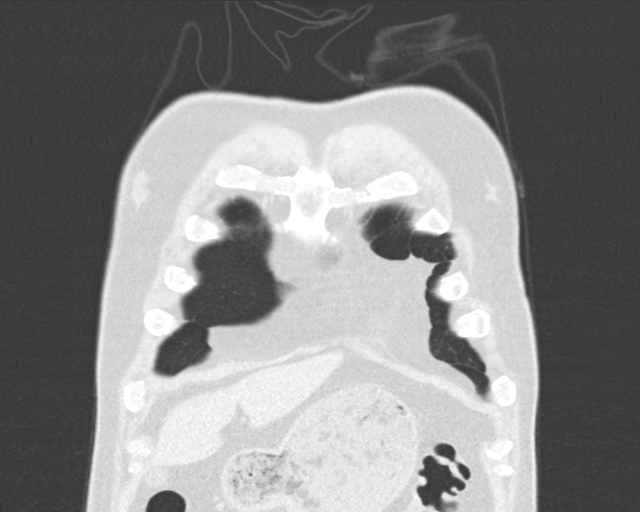
[im 65/163  lung]
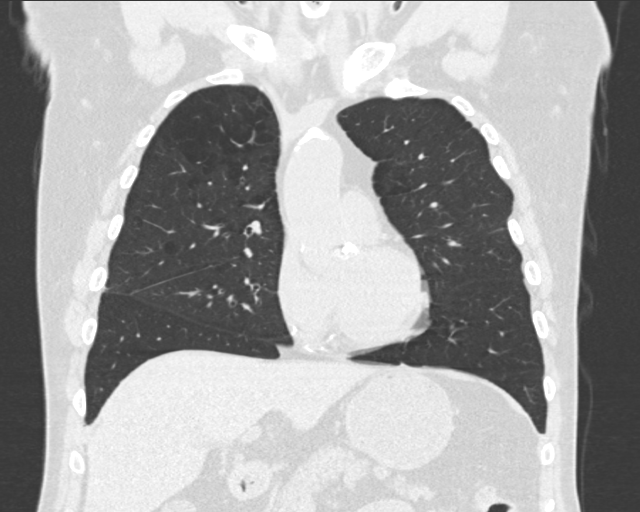
[im 98/163  lung]
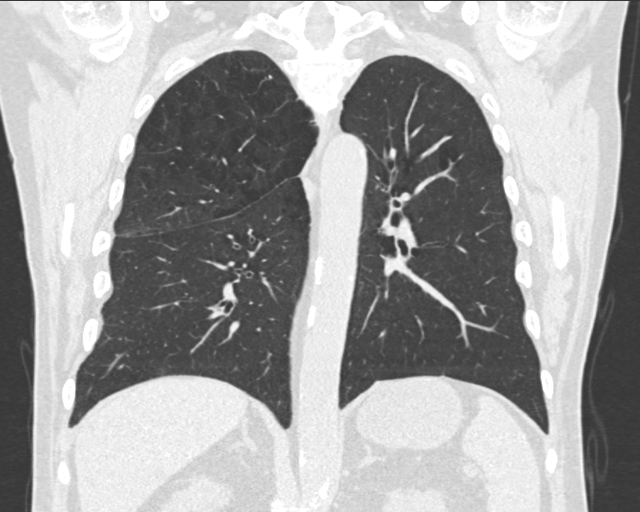

[15 of 36 positions shown; findings below may reference images not displayed]

FINDINGS: Cardiovascular: No acute findings. Aortic and coronary artery
atherosclerosis.

Mediastinum/Nodes: No masses or pathologically enlarged lymph nodes
identified on this unenhanced exam.

Lungs/Pleura: Stable postop changes in left hemithorax. Postop
scarring in posterior left upper lobe remains stable with associated
nodular opacity measuring 9 mm on image 65/5, compared to 10 mm
previously.

A 6 mm pulmonary nodule is seen in the right lower lobe on image
97/5, increased from 4 mm on previous study. No other new or
enlarging pulmonary nodules identified. Thickening of right minor
fissure remains stable. Sub-cm ground-glass nodules in the right
upper and middle lobes remains stable. Mild-to-moderate
centrilobular emphysema with bullous changes again noted.

Upper Abdomen:  Unremarkable.

Musculoskeletal:  No suspicious bone lesions.
IMPRESSION: Increased size of 6 mm right lower lobe pulmonary nodule, suspicious
for malignancy.

Stable postop scarring and associated nodular opacity in posterior
left upper lobe.

Stable sub-cm right upper and middle lobe ground-glass nodules.

No evidence of lymphadenopathy or pleural effusion.

Aortic Atherosclerosis (6NH1H-BCL.L) and Emphysema (6NH1H-SDZ.W).
Coronary artery atherosclerosis.

## 2019-11-17 DIAGNOSIS — Z1389 Encounter for screening for other disorder: Secondary | ICD-10-CM | POA: Insufficient documentation

## 2019-11-17 DIAGNOSIS — K222 Esophageal obstruction: Secondary | ICD-10-CM | POA: Insufficient documentation

## 2019-11-17 DIAGNOSIS — R1310 Dysphagia, unspecified: Secondary | ICD-10-CM | POA: Insufficient documentation

## 2019-12-16 ENCOUNTER — Other Ambulatory Visit: Payer: Self-pay

## 2019-12-16 ENCOUNTER — Inpatient Hospital Stay: Payer: Medicare Other | Attending: Internal Medicine

## 2019-12-16 ENCOUNTER — Ambulatory Visit (HOSPITAL_COMMUNITY)
Admission: RE | Admit: 2019-12-16 | Discharge: 2019-12-16 | Disposition: A | Discharge status: Home / Self Care | Payer: Medicare Other | Source: Ambulatory Visit | Attending: Physician Assistant | Admitting: Physician Assistant

## 2019-12-16 DIAGNOSIS — R911 Solitary pulmonary nodule: Secondary | ICD-10-CM | POA: Insufficient documentation

## 2019-12-16 DIAGNOSIS — H9190 Unspecified hearing loss, unspecified ear: Secondary | ICD-10-CM | POA: Insufficient documentation

## 2019-12-16 DIAGNOSIS — Z79899 Other long term (current) drug therapy: Secondary | ICD-10-CM | POA: Diagnosis not present

## 2019-12-16 DIAGNOSIS — I10 Essential (primary) hypertension: Secondary | ICD-10-CM | POA: Diagnosis not present

## 2019-12-16 DIAGNOSIS — S0091XA Abrasion of unspecified part of head, initial encounter: Secondary | ICD-10-CM | POA: Insufficient documentation

## 2019-12-16 DIAGNOSIS — C3412 Malignant neoplasm of upper lobe, left bronchus or lung: Secondary | ICD-10-CM

## 2019-12-16 DIAGNOSIS — Z902 Acquired absence of lung [part of]: Secondary | ICD-10-CM | POA: Insufficient documentation

## 2019-12-16 DIAGNOSIS — H748X3 Other specified disorders of middle ear and mastoid, bilateral: Secondary | ICD-10-CM | POA: Insufficient documentation

## 2019-12-16 DIAGNOSIS — N289 Disorder of kidney and ureter, unspecified: Secondary | ICD-10-CM | POA: Insufficient documentation

## 2019-12-16 LAB — CBC WITH DIFFERENTIAL (CANCER CENTER ONLY)
Abs Immature Granulocytes: 0.04 10*3/uL (ref 0.00–0.07)
Basophils Absolute: 0.1 10*3/uL (ref 0.0–0.1)
Basophils Relative: 1 %
Eosinophils Absolute: 0.2 10*3/uL (ref 0.0–0.5)
Eosinophils Relative: 2 %
HCT: 41.8 % (ref 39.0–52.0)
Hemoglobin: 14.2 g/dL (ref 13.0–17.0)
Immature Granulocytes: 0 %
Lymphocytes Relative: 22 %
Lymphs Abs: 2.3 10*3/uL (ref 0.7–4.0)
MCH: 30.2 pg (ref 26.0–34.0)
MCHC: 34 g/dL (ref 30.0–36.0)
MCV: 88.9 fL (ref 80.0–100.0)
Monocytes Absolute: 0.9 10*3/uL (ref 0.1–1.0)
Monocytes Relative: 8 %
Neutro Abs: 7 10*3/uL (ref 1.7–7.7)
Neutrophils Relative %: 67 %
Platelet Count: 238 10*3/uL (ref 150–400)
RBC: 4.7 MIL/uL (ref 4.22–5.81)
RDW: 14.3 % (ref 11.5–15.5)
WBC Count: 10.6 10*3/uL — ABNORMAL HIGH (ref 4.0–10.5)
nRBC: 0 % (ref 0.0–0.2)

## 2019-12-16 LAB — CMP (CANCER CENTER ONLY)
ALT: 29 U/L (ref 0–44)
AST: 23 U/L (ref 15–41)
Albumin: 3.9 g/dL (ref 3.5–5.0)
Alkaline Phosphatase: 83 U/L (ref 38–126)
Anion gap: 10 (ref 5–15)
BUN: 14 mg/dL (ref 8–23)
CO2: 24 mmol/L (ref 22–32)
Calcium: 9.2 mg/dL (ref 8.9–10.3)
Chloride: 106 mmol/L (ref 98–111)
Creatinine: 1.96 mg/dL — ABNORMAL HIGH (ref 0.61–1.24)
GFR, Est AFR Am: 39 mL/min — ABNORMAL LOW (ref >60)
GFR, Estimated: 34 mL/min — ABNORMAL LOW (ref >60)
Glucose, Bld: 187 mg/dL — ABNORMAL HIGH (ref 70–99)
Potassium: 5 mmol/L (ref 3.5–5.1)
Sodium: 140 mmol/L (ref 135–145)
Total Bilirubin: 1.1 mg/dL (ref 0.3–1.2)
Total Protein: 7.4 g/dL (ref 6.5–8.1)

## 2019-12-18 ENCOUNTER — Other Ambulatory Visit: Payer: Self-pay

## 2019-12-18 ENCOUNTER — Inpatient Hospital Stay: Payer: Medicare Other | Admitting: Internal Medicine

## 2019-12-18 ENCOUNTER — Encounter: Payer: Self-pay | Admitting: Internal Medicine

## 2019-12-18 DIAGNOSIS — C3490 Malignant neoplasm of unspecified part of unspecified bronchus or lung: Secondary | ICD-10-CM

## 2019-12-18 DIAGNOSIS — C3412 Malignant neoplasm of upper lobe, left bronchus or lung: Secondary | ICD-10-CM | POA: Diagnosis not present

## 2019-12-18 NOTE — Progress Notes (Signed)
[facility] Telephone:([phone]) [phone]   Fax:(336) [phone]  OFFICE PROGRESS NOTE  [name], DO [address] [place] [address]  DIAGNOSIS: Metastatic non-small cell lung cancer, adenocarcinoma initially diagnosed in December of 2004  PRIOR THERAPY: 1) status post course of concurrent chemoradiation under the care of Dr. Truddie Coco. 2) status post left upper lobe wedge resection on 01/10/2005 for recurrent disease in the left lung.  CURRENT THERAPY: Erlotinib 150 mg by mouth daily started in 2006.  INTERVAL HISTORY:  [name] 69 y.o. male returns to the clinic today for follow-up visit.  The patient is feeling fine today with no concerning complaints.  He was recently switched his treatment from Tarceva to the generic erlotinib.  He has few more episodes of diarrhea and mild skin rash with the new treatment.  He denied having any current chest pain, shortness of breath, cough or hemoptysis.  He denied having any fever or chills.  He has no nausea, vomiting, diarrhea or constipation.  He has no headache or visual changes.  He is here today for evaluation and repeat CT scan of the chest for restaging of his disease.  MEDICAL HISTORY: Past Medical History:  Diagnosis Date  . AAA (abdominal aortic aneurysm) (HCC)    a. 3.4 x 3.5 on scan 02/28/13.  . Allergic reaction to contrast dye   . Atherosclerosis   . CAD (coronary artery disease)    a. stenting of RCA/LCx 2004. b. [place] 08/2013:  s/p DES to LCx for severe ISR.  . Diabetes mellitus   . Diverticulosis   . Emphysema lung ([place])   . Emphysema of lung ([place])   . Encounter for therapeutic drug monitoring 06/06/2016  . Hemorrhoids   . Hepatic steatosis   . HTN (hypertension)   . Hyperlipidemia   . Kidney stone   . Lung cancer ([facility]) dx'd 2004   chemo/xrt comp; tarceva ongoing    ALLERGIES:  is allergic to iohexol; morphine and related; and other.  MEDICATIONS:  Current Outpatient Medications    Medication Sig Dispense Refill  . amLODipine (NORVASC) 5 MG tablet Take 1 tablet (5 mg total) by mouth daily. 90 tablet 3  . aspirin 81 MG chewable tablet Chew 81 mg by mouth daily.    Marland Kitchen atorvastatin (LIPITOR) 40 MG tablet Take 40 mg by mouth daily.    . clopidogrel (PLAVIX) 75 MG tablet TAKE 1 TABLET (75 MG TOTAL) BY MOUTH DAILY. 90 tablet 2  . erlotinib (TARCEVA) 150 MG tablet Take 1 tablet (150 mg total) by mouth daily. Take on an empty stomach 1 hour before meals or 2 hours after. 30 tablet 2  . glimepiride (AMARYL) 4 MG tablet Take 4 mg by mouth 2 (two) times daily.    . isosorbide mononitrate (IMDUR) 30 MG 24 hr tablet TAKE 1 TABLET BY MOUTH ONCE DAILY 90 tablet 1  . Loperamide HCl (IMODIUM A-D) 1 MG/7.5ML LIQD Take 7.5 mLs by mouth as directed.     Marland Kitchen losartan (COZAAR) 100 MG tablet TAKE ONE TABLET BY MOUTH EVERYDAY  5  . metoprolol (LOPRESSOR) 100 MG tablet Take 100 mg by mouth daily. Take one tablet by mouth daily with or following a meal  4  . nitroGLYCERIN (NITROSTAT) 0.4 MG SL tablet Place 1 tablet (0.4 mg total) under the tongue every 5 (five) minutes as needed for chest pain (up to 3 doses). 25 tablet 3  . omeprazole (PRILOSEC) 20 MG capsule Take 20  mg by mouth 2 (two) times daily.  5  . sitaGLIPtin (JANUVIA) 100 MG tablet Take 100 mg by mouth daily.    . zaleplon (SONATA) 10 MG capsule Take 10 mg by mouth at bedtime.  5   No current facility-administered medications for this visit.    REVIEW OF SYSTEMS:  Constitutional: positive for fatigue Eyes: negative Ears, nose, mouth, throat, and face: negative Respiratory: negative Cardiovascular: negative Gastrointestinal: positive for diarrhea Genitourinary:negative Integument/breast: positive for rash Hematologic/lymphatic: negative Musculoskeletal:negative Neurological: negative Behavioral/Psych: negative Endocrine: negative Allergic/Immunologic: negative   PHYSICAL EXAMINATION: General appearance: alert, cooperative,  fatigued and no distress Head: Normocephalic, without obvious abnormality, atraumatic Neck: no adenopathy Lymph nodes: Cervical, supraclavicular, and axillary nodes normal. Resp: clear to auscultation bilaterally Back: symmetric, no curvature. ROM normal. No CVA tenderness. Cardio: regular rate and rhythm, S1, S2 normal, no murmur, click, rub or gallop GI: soft, non-tender; bowel sounds normal; no masses,  no organomegaly Extremities: extremities normal, atraumatic, no cyanosis or edema Neurologic: Alert and oriented X 3, normal strength and tone. Normal symmetric reflexes. Normal coordination and gait  ECOG PERFORMANCE STATUS: 1 - Symptomatic but completely ambulatory  Pulse 88, temperature 98.2 F (36.8 C), temperature source Temporal, resp. rate 18, height 6' 1"  (1.854 m), weight 209 lb 12.8 oz (95.2 kg), SpO2 100 %.  LABORATORY DATA: Lab Results  Component Value Date   WBC 10.6 (H) 12/16/2019   HGB 14.2 12/16/2019   HCT 41.8 12/16/2019   MCV 88.9 12/16/2019   PLT 238 12/16/2019      Chemistry      Component Value Date/Time   NA 140 12/16/2019 1103   NA 139 06/04/2017 0948   K 5.0 12/16/2019 1103   K 5.1 06/04/2017 0948   CL 106 12/16/2019 1103   CL 106 06/10/2012 0900   CO2 24 12/16/2019 1103   CO2 26 06/04/2017 0948   BUN 14 12/16/2019 1103   BUN 14.2 06/04/2017 0948   CREATININE 1.96 (H) 12/16/2019 1103   CREATININE 1.5 (H) 06/04/2017 0948      Component Value Date/Time   CALCIUM 9.2 12/16/2019 1103   CALCIUM 9.6 06/04/2017 0948   ALKPHOS 83 12/16/2019 1103   ALKPHOS 84 06/04/2017 0948   AST 23 12/16/2019 1103   AST 24 06/04/2017 0948   ALT 29 12/16/2019 1103   ALT 35 06/04/2017 0948   BILITOT 1.1 12/16/2019 1103   BILITOT 0.82 06/04/2017 0948       RADIOGRAPHIC STUDIES: CT Chest Wo Contrast  Result Date: 12/16/2019 CLINICAL DATA:  Restaging lung cancer. History of partial left lobectomy. EXAM: CT CHEST WITHOUT CONTRAST TECHNIQUE: Multidetector CT  imaging of the chest was performed following the standard protocol without IV contrast. COMPARISON:  Chest CT 06/16/2019, 12/13/2018, 06/14/2018 and 06/04/2017. FINDINGS: Cardiovascular: Diffuse atherosclerosis of the aorta, great vessels and coronary arteries. Aortic valvular calcifications are again noted. The heart size is normal. There is no pericardial effusion. Mediastinum/Nodes: There are no enlarged mediastinal, hilar or axillary lymph nodes.Hilar assessment is limited by the lack of intravenous contrast, although the hilar contours appear unchanged. The thyroid gland, trachea and esophagus demonstrate no significant findings. Lungs/Pleura: No pleural effusion or pneumothorax. Stable postsurgical changes status post left upper lobe wedge resection. There is stable scarring along the suture line. There is progressive enlargement of a right lower lobe nodule, currently measuring 9 x 8 mm on image 89/5. Mild nodular thickening along the superior aspect of the right major fissure has not significantly changed from 06/14/2018.  No other new or enlarging pulmonary nodules. Stable moderate centrilobular emphysema and scattered central airway thickening. Upper abdomen: The visualized upper abdomen appears stable without acute findings. There are probable nonobstructing calculi in the upper pole calyces of the right kidney. A small low-density lesion posteriorly in the liver is unchanged. Musculoskeletal/Chest wall: There is no chest wall mass or suspicious osseous finding. Stable chronic right posterior rib deformities. IMPRESSION: 1. Further enlargement of right lower lobe pulmonary nodule, suspicious for bronchogenic carcinoma. Consider PET-CT or tissue sampling. 2. No adenopathy or other evidence of metastatic disease. 3. Aortic Atherosclerosis (ICD10-I70.0) and Emphysema (ICD10-J43.9). Electronically Signed   By: Richardean Sale M.D.   On: 12/16/2019 15:02    ASSESSMENT/PLAN:  This is a very pleasant 69 years  old white male with metastatic non-small cell lung cancer, adenocarcinoma with positive EGFR mutation and he is currently on treatment with Tarceva since 2006.  This was a switch of 2 months ago to the generic Erlotinib and he is tolerating it well except for few episodes of diarrhea and mild skin rash. The patient had repeat CT scan of the chest performed recently.  I personally and independently reviewed the scan images and discussed the results and showed the images to the patient today. His scan showed further mild increase in the right lower lobe pulmonary nodule again suspicious for bronchogenic carcinoma. I recommended for him to continue his current treatment with erlotinib for now. I will have the patient repeat CT scan of the chest in around 6 months for reevaluation of this nodule.  We may consider it for SBRT if it continues to enlarge. For the hypertension, the patient will continue with his current blood pressure medication. For the renal insufficiency he will discuss with his primary care physician the lab results and need for evaluation by nephrology. He was advised to call immediately if he has any other concerning symptoms in the interval. All questions were answered. The patient knows to call the clinic with any problems, questions or concerns. We can certainly see the patient much sooner if necessary.  Disclaimer: This note was dictated with voice recognition software. Similar sounding words can inadvertently be transcribed and may not be corrected upon review.

## 2019-12-23 ENCOUNTER — Telehealth: Payer: Self-pay | Admitting: Internal Medicine

## 2019-12-23 NOTE — Telephone Encounter (Signed)
Scheduled per los. Called and spoke with patient. Confirmed appt 

## 2020-01-15 ENCOUNTER — Other Ambulatory Visit: Payer: Self-pay | Admitting: Medical Oncology

## 2020-01-15 DIAGNOSIS — C3490 Malignant neoplasm of unspecified part of unspecified bronchus or lung: Secondary | ICD-10-CM

## 2020-01-15 MED ORDER — ERLOTINIB HCL 150 MG PO TABS: 150.0000 mg | ORAL_TABLET | Freq: Every day | ORAL | 2 refills | Status: DC

## 2020-04-09 ENCOUNTER — Other Ambulatory Visit: Payer: Self-pay | Admitting: *Deleted

## 2020-04-09 DIAGNOSIS — C3490 Malignant neoplasm of unspecified part of unspecified bronchus or lung: Secondary | ICD-10-CM

## 2020-04-09 MED ORDER — ERLOTINIB HCL 150 MG PO TABS: 150.0000 mg | ORAL_TABLET | Freq: Every day | ORAL | 2 refills | Status: DC

## 2020-04-22 ENCOUNTER — Other Ambulatory Visit (HOSPITAL_COMMUNITY): Payer: Self-pay | Admitting: Cardiovascular Disease

## 2020-04-22 ENCOUNTER — Other Ambulatory Visit: Payer: Self-pay

## 2020-04-22 ENCOUNTER — Ambulatory Visit (HOSPITAL_COMMUNITY)
Admission: RE | Admit: 2020-04-22 | Discharge: 2020-04-22 | Disposition: A | Discharge status: Home / Self Care | Payer: Medicare Other | Source: Ambulatory Visit | Attending: Cardiovascular Disease | Admitting: Cardiovascular Disease

## 2020-04-22 DIAGNOSIS — I714 Abdominal aortic aneurysm, without rupture, unspecified: Secondary | ICD-10-CM

## 2020-06-14 NOTE — Progress Notes (Signed)
CARDIOLOGY OFFICE NOTE  Date:  06/28/2020    [name] Date of Birth: 21-Feb-1951 Medical Record #[id number]  PCP:  [name], DO  Cardiologist:  Burt Knack   Chief Complaint  Patient presents with  . Follow-up    History of Present Illness: [name] is a 69 y.o. male who presents today for a one year check. Seen for Dr. Burt Knack.   He has a history of known CAD with remote PCI of the RCA and LCX in 2004, s/p cath with stenting of the LCX due to ISR in 2015. He has had a history of metastatic lung cancer - found in 2004.   Other issues include lung lesion, AAA (last imaged in 04/2020), HTN, HLD, DM, COPD and HLD.   Last seen by Dr. Burt Knack a year ago - stable cardiac status. Had been found to have a small lung lesion that was concerning for malignancy but too small for PET scanning at that time. He was to have repeat scanning.   Comes in today. Here alone. He has had recurrent CT scan - for PET later this month - most likely going to have chemotherapy. He does not feel good - no energy. Losing weight. Not trying. He notes more chest discomfort with worrying/anxiety and some even with walking his dog. Described as a heaviness - notes somewhat similar to his prior chest pain syndrome. Not really short of breath. He can have a fleeting spell of dizziness if he turns to fast.   Past Medical History:  Diagnosis Date  . AAA (abdominal aortic aneurysm) (HCC)    a. 3.4 x 3.5 on scan 02/28/13.  . Allergic reaction to contrast dye   . Atherosclerosis   . CAD (coronary artery disease)    a. stenting of RCA/LCx 2004. b. [place] 08/2013:  s/p DES to LCx for severe ISR.  . Diabetes mellitus   . Diverticulosis   . Emphysema lung ([facility])   . Emphysema of lung ([place])   . Encounter for therapeutic drug monitoring 06/06/2016  . Hemorrhoids   . Hepatic steatosis   . HTN (hypertension)   . Hyperlipidemia   . Kidney stone   . Lung cancer ([facility]) dx'd 2004   chemo/xrt comp; tarceva  ongoing    Past Surgical History:  Procedure Laterality Date  . ANGIOPLASTY / STENTING FEMORAL  2015   2004-groin  . LEFT HEART CATHETERIZATION WITH CORONARY ANGIOGRAM N/A 09/08/2013   Procedure: LEFT HEART CATHETERIZATION WITH CORONARY ANGIOGRAM;  Surgeon: Blane Ohara, MD;  Location: [facility] CATH LAB;  Service: Cardiovascular;  Laterality: N/A;  . left partial lobectomy    . WISDOM TOOTH EXTRACTION       Medications: Current Meds  Medication Sig  . ACCU-CHEK AVIVA PLUS test strip 1 each daily.  Marland Kitchen amLODipine (NORVASC) 5 MG tablet Take 1 tablet (5 mg total) by mouth daily.  Marland Kitchen aspirin 81 MG chewable tablet Chew 81 mg by mouth daily.  Marland Kitchen atorvastatin (LIPITOR) 40 MG tablet Take 40 mg by mouth daily.  . cetirizine (ZYRTEC) 10 MG chewable tablet Chew 10 mg by mouth daily as needed for allergies.  Marland Kitchen clopidogrel (PLAVIX) 75 MG tablet TAKE 1 TABLET (75 MG TOTAL) BY MOUTH DAILY.  Marland Kitchen erlotinib (TARCEVA) 150 MG tablet Take 1 tablet (150 mg total) by mouth daily. Take on an empty stomach 1 hour before meals or 2 hours after.  Marland Kitchen glimepiride (AMARYL) 4 MG tablet Take 4 mg by mouth 2 (  two) times daily.  Marland Kitchen JARDIANCE 25 MG TABS tablet Take 25 mg by mouth daily.  . Lancets (FREESTYLE) lancets 1 each daily.  Marland Kitchen losartan (COZAAR) 50 MG tablet Take 25 mg by mouth daily.  . metoprolol (LOPRESSOR) 100 MG tablet Take 100 mg by mouth daily. Take one tablet by mouth daily with or following a meal  . nitroGLYCERIN (NITROSTAT) 0.4 MG SL tablet Place 1 tablet (0.4 mg total) under the tongue every 5 (five) minutes as needed for chest pain (up to 3 doses).  Marland Kitchen omeprazole (PRILOSEC) 20 MG capsule Take 20 mg by mouth 2 (two) times daily.  . TRADJENTA 5 MG TABS tablet Take 5 mg by mouth daily.  . zaleplon (SONATA) 10 MG capsule Take 10 mg by mouth at bedtime.  . [DISCONTINUED] isosorbide mononitrate (IMDUR) 30 MG 24 hr tablet TAKE 1 TABLET BY MOUTH ONCE DAILY     Allergies: Allergies  Allergen Reactions  . Iohexol  Other (See Comments)     Code: HIVES, Desc: pt recieves 50mg  benadryl 1 hour prior to scan   . Morphine And Related Anxiety    hallucinations  . Other Rash    chlorascrub when starting IV    Social History: The patient  reports that he quit smoking about 17 years ago. He has never used smokeless tobacco. He reports that he does not drink alcohol and does not use drugs.   Family History: The patient's family history includes Diabetes in his sister; Heart disease in his sister; Ovarian cancer in his sister.   Review of Systems: Please see the history of present illness.   All other systems are reviewed and negative.   Physical Exam: VS:  BP 120/70   Pulse 84   Ht 6\' 1"  (1.854 m)   Wt 199 lb 3.2 oz (90.4 kg)   SpO2 93%   BMI 26.28 kg/m  .  BMI Body mass index is 26.28 kg/m.  Wt Readings from Last 3 Encounters:  06/28/20 199 lb 3.2 oz (90.4 kg)  06/21/20 200 lb 4.8 oz (90.9 kg)  12/18/19 209 lb 12.8 oz (95.2 kg)    General: Alert and in no acute distress. His weight is down 16 pounds over the past year.  Cardiac: Regular rate and rhythm. No murmurs, rubs, or gallops. No edema.  Respiratory:  Lungs are clear to auscultation bilaterally with normal work of breathing.  GI: Soft and nontender.  MS: No deformity or atrophy. Gait and ROM intact.  Skin: Warm and dry. Color is normal.  Neuro:  Strength and sensation are intact and no gross focal deficits noted.  Psych: Alert, appropriate and with normal affect.   LABORATORY DATA:  EKG:  EKG is ordered today.  Personally reviewed by me. This demonstrates sinus with PACs - no acute changes.  Lab Results  Component Value Date   WBC 10.0 06/18/2020   HGB 14.7 06/18/2020   HCT 43.6 06/18/2020   PLT 237 06/18/2020   GLUCOSE 124 (H) 06/18/2020   ALT 26 06/18/2020   AST 20 06/18/2020   NA 138 06/18/2020   K 4.7 06/18/2020   CL 104 06/18/2020   CREATININE 1.86 (H) 06/18/2020   BUN 14 06/18/2020   CO2 26 06/18/2020   INR 1.0  09/03/2013       BNP (last 3 results) No results for input(s): BNP in the last 8760 hours.  ProBNP (last 3 results) No results for input(s): PROBNP in the last 8760 hours.   Other Studies  Reviewed Today:  AAA DOPPLER 04/2020 Summary:  Abdominal Aorta: There is evidence of abnormal dilatation of the distal  Abdominal aorta. The largest aortic measurement is 3.8 cm. The largest  aortic diameter remains essentially unchanged compared to prior exam.  Previous diameter measurement was 3.8 cm    Echo Study Conclusions 05/2017  - Left ventricle: The cavity size was normal. Wall thickness was  increased in a pattern of mild LVH. Systolic function was mildly  reduced. The estimated ejection fraction was in the range of 45%  to 50%. Diffuse hypokinesis. Doppler parameters are consistent  with abnormal left ventricular relaxation (grade 1 diastolic  dysfunction). Doppler parameters are consistent with  indeterminate ventricular filling pressure.  - Aortic valve: Transvalvular velocity was within the normal range.  There was no stenosis. There was moderate regurgitation.  Regurgitation pressure half-time: 472 ms.  - Mitral valve: Transvalvular velocity was within the normal range.  There was no evidence for stenosis. There was no regurgitation.  - Right ventricle: The cavity size was normal. Wall thickness was  normal. Systolic function was normal.  - Atrial septum: No defect or patent foramen ovale was identified.  - Tricuspid valve: There was no regurgitation.   Final Cath Conclusions 2015:  1. 2 vessel CAD with patency of the previously implanted RCA stent and severe ISR of the LCx stent.  2. Normal LV function  3. Successful PCI of the LCx with a single DES  4. Patency of the LAD without significant stenosis  Recommendations:  DAPT with ASA and plavix for at least 12 months.  Sherren Mocha  09/08/2013, 3:02 PM   ASSESSMENT & PLAN:    1. CAD - remains  on DAPT due to 1st generation stent and recurrent ACS - having some chest discomfort with and without exertion - will increase his Imdur -arrange for Lexiscan - He admits that the anxiety with his cancer is also playing a role but the exertional component is still concerning to me. He has NTG on hand. {  The risks [chest pain, shortness of breath, cardiac arrhythmias, dizziness, blood pressure fluctuations, myocardial infarction, stroke/transient ischemic attack, nausea, vomiting, allergic reaction, radiation exposure, metallic taste sensation and life-threatening complications (estimated to be 1 in 10,000)], benefits (risk stratification, diagnosing coronary artery disease, treatment guidance) and alternatives of a nuclear stress test were discussed in detail with Mr. Schillaci and he agrees to proceed.    2. HLD - LDL at 54 when checked in March - would keep on statin therapy.   3. AAA - last measured in 04/2020 and remains < 4 cm.   4. Metastatic non small cell lung cancer, adenocarcinoma - recent scan with suspicion of a new nodule - for PET scanning later this month and further plans per Oncology - sounds like his treatment plan is going to change.   5. HTN - BP at goal by current reading - he has some fleeting dizziness with position changes - we are increasing his Imdur today - will cut back the Losartan back and follow.   6. Weight loss - down 16 pounds over this past year.   Current medicines are reviewed with the patient today.  The patient does not have concerns regarding medicines other than what has been noted above.  The following changes have been made:  See above.  Labs/ tests ordered today include:    Orders Placed This Encounter  Procedures  . Cardiac Stress Test: Informed Consent Details: Physician/Practitioner Attestation; Transcribe to consent form  and obtain patient signature  . MYOCARDIAL PERFUSION IMAGING  . EKG 12-Lead     Disposition:   FU with me or  Dr. Burt Knack in  one month - Imdur is increased today. Cutting losartan back and will arrange for Myoview - further disposition to follow.    Patient is agreeable to this plan and will call if any problems develop in the interim.   SignedTruitt Merle, NP  06/28/2020 2:14 PM  [facility] [address] [place], [place]  [address] Phone: [phone] Fax: [phone]

## 2020-06-16 ENCOUNTER — Ambulatory Visit: Payer: Medicare Other | Admitting: Physician Assistant

## 2020-06-18 ENCOUNTER — Encounter (HOSPITAL_COMMUNITY): Payer: Self-pay

## 2020-06-18 ENCOUNTER — Other Ambulatory Visit: Payer: Self-pay

## 2020-06-18 ENCOUNTER — Ambulatory Visit (HOSPITAL_COMMUNITY)
Admission: RE | Admit: 2020-06-18 | Discharge: 2020-06-18 | Disposition: A | Discharge status: Home / Self Care | Payer: Medicare Other | Source: Ambulatory Visit | Attending: Internal Medicine | Admitting: Internal Medicine

## 2020-06-18 ENCOUNTER — Inpatient Hospital Stay: Payer: Medicare Other | Attending: Internal Medicine

## 2020-06-18 DIAGNOSIS — N289 Disorder of kidney and ureter, unspecified: Secondary | ICD-10-CM | POA: Diagnosis not present

## 2020-06-18 DIAGNOSIS — C3412 Malignant neoplasm of upper lobe, left bronchus or lung: Secondary | ICD-10-CM | POA: Diagnosis not present

## 2020-06-18 DIAGNOSIS — C3490 Malignant neoplasm of unspecified part of unspecified bronchus or lung: Secondary | ICD-10-CM | POA: Insufficient documentation

## 2020-06-18 DIAGNOSIS — R634 Abnormal weight loss: Secondary | ICD-10-CM | POA: Diagnosis not present

## 2020-06-18 DIAGNOSIS — Z9221 Personal history of antineoplastic chemotherapy: Secondary | ICD-10-CM | POA: Diagnosis not present

## 2020-06-18 DIAGNOSIS — Z923 Personal history of irradiation: Secondary | ICD-10-CM | POA: Insufficient documentation

## 2020-06-18 DIAGNOSIS — R911 Solitary pulmonary nodule: Secondary | ICD-10-CM | POA: Insufficient documentation

## 2020-06-18 DIAGNOSIS — Z79899 Other long term (current) drug therapy: Secondary | ICD-10-CM | POA: Insufficient documentation

## 2020-06-18 LAB — CBC WITH DIFFERENTIAL (CANCER CENTER ONLY)
Abs Immature Granulocytes: 0.03 10*3/uL (ref 0.00–0.07)
Basophils Absolute: 0.1 10*3/uL (ref 0.0–0.1)
Basophils Relative: 1 %
Eosinophils Absolute: 0.3 10*3/uL (ref 0.0–0.5)
Eosinophils Relative: 3 %
HCT: 43.6 % (ref 39.0–52.0)
Hemoglobin: 14.7 g/dL (ref 13.0–17.0)
Immature Granulocytes: 0 %
Lymphocytes Relative: 25 %
Lymphs Abs: 2.5 10*3/uL (ref 0.7–4.0)
MCH: 29.2 pg (ref 26.0–34.0)
MCHC: 33.7 g/dL (ref 30.0–36.0)
MCV: 86.7 fL (ref 80.0–100.0)
Monocytes Absolute: 0.9 10*3/uL (ref 0.1–1.0)
Monocytes Relative: 9 %
Neutro Abs: 6.3 10*3/uL (ref 1.7–7.7)
Neutrophils Relative %: 62 %
Platelet Count: 237 10*3/uL (ref 150–400)
RBC: 5.03 MIL/uL (ref 4.22–5.81)
RDW: 13.6 % (ref 11.5–15.5)
WBC Count: 10 10*3/uL (ref 4.0–10.5)
nRBC: 0 % (ref 0.0–0.2)

## 2020-06-18 LAB — CMP (CANCER CENTER ONLY)
ALT: 26 U/L (ref 0–44)
AST: 20 U/L (ref 15–41)
Albumin: 3.9 g/dL (ref 3.5–5.0)
Alkaline Phosphatase: 85 U/L (ref 38–126)
Anion gap: 8 (ref 5–15)
BUN: 14 mg/dL (ref 8–23)
CO2: 26 mmol/L (ref 22–32)
Calcium: 9.5 mg/dL (ref 8.9–10.3)
Chloride: 104 mmol/L (ref 98–111)
Creatinine: 1.86 mg/dL — ABNORMAL HIGH (ref 0.61–1.24)
GFR, Estimated: 38 mL/min — ABNORMAL LOW (ref >60)
Glucose, Bld: 124 mg/dL — ABNORMAL HIGH (ref 70–99)
Potassium: 4.7 mmol/L (ref 3.5–5.1)
Sodium: 138 mmol/L (ref 135–145)
Total Bilirubin: 1.2 mg/dL (ref 0.3–1.2)
Total Protein: 7.4 g/dL (ref 6.5–8.1)

## 2020-06-21 ENCOUNTER — Inpatient Hospital Stay: Payer: Medicare Other | Admitting: Internal Medicine

## 2020-06-21 ENCOUNTER — Other Ambulatory Visit: Payer: Self-pay

## 2020-06-21 ENCOUNTER — Encounter: Payer: Self-pay | Admitting: Internal Medicine

## 2020-06-21 VITALS — BP 115/93 | HR 77 | Temp 97.6°F | Resp 17 | Ht 73.0 in | Wt 200.3 lb

## 2020-06-21 DIAGNOSIS — C3412 Malignant neoplasm of upper lobe, left bronchus or lung: Secondary | ICD-10-CM

## 2020-06-21 DIAGNOSIS — C3490 Malignant neoplasm of unspecified part of unspecified bronchus or lung: Secondary | ICD-10-CM

## 2020-06-21 DIAGNOSIS — Z5181 Encounter for therapeutic drug level monitoring: Secondary | ICD-10-CM | POA: Diagnosis not present

## 2020-06-21 NOTE — Progress Notes (Signed)
[name] [phone]   [phone]  OFFICE PROGRESS NOTE  [name], DO [address] [place] [address]  DIAGNOSIS: Metastatic non-small cell lung cancer, adenocarcinoma initially diagnosed in December of 2004  PRIOR THERAPY: 1) status post course of concurrent chemoradiation under the care of Dr. Truddie Coco. 2) status post left upper lobe wedge resection on 01/10/2005 for recurrent disease in the left lung.  CURRENT THERAPY: Erlotinib 150 mg by mouth daily started in 2006.  INTERVAL HISTORY:  ALCIDE [name] II 69 y.o. male returns to the clinic today for 6 months follow-up visit.  The patient is feeling fine today with no concerning complaints except for fatigue and lack of appetite.  He lost few pounds since his last visit.  He denied having any chest pain, shortness of breath, cough or hemoptysis.  He denied having any fever or chills.  He has no nausea, vomiting, diarrhea or constipation.  The patient denied having any headache or visual changes.  He had repeat CT scan of the chest performed recently and he is here for evaluation and discussion of his scan results.  MEDICAL HISTORY: Past Medical History:  Diagnosis Date  . AAA (abdominal aortic aneurysm) (HCC)    a. 3.4 x 3.5 on scan 02/28/13.  . Allergic reaction to contrast dye   . Atherosclerosis   . CAD (coronary artery disease)    a. stenting of RCA/LCx 2004. b. [place] 08/2013:  s/p DES to LCx for severe ISR.  . Diabetes mellitus   . Diverticulosis   . Emphysema lung ([place])   . Emphysema of lung ([place])   . Encounter for therapeutic drug monitoring 06/06/2016  . Hemorrhoids   . Hepatic steatosis   . HTN (hypertension)   . Hyperlipidemia   . Kidney stone   . Lung cancer ([place]) dx'd 2004   chemo/xrt comp; tarceva ongoing    ALLERGIES:  is allergic to iohexol, morphine and related, and other.  MEDICATIONS:  Current Outpatient Medications  Medication Sig Dispense Refill  .  amLODipine (NORVASC) 5 MG tablet Take 1 tablet (5 mg total) by mouth daily. 90 tablet 3  . aspirin 81 MG chewable tablet Chew 81 mg by mouth daily.    Marland Kitchen atorvastatin (LIPITOR) 40 MG tablet Take 40 mg by mouth daily.    . cetirizine (ZYRTEC) 10 MG chewable tablet Chew 10 mg by mouth daily as needed for allergies.    Marland Kitchen clopidogrel (PLAVIX) 75 MG tablet TAKE 1 TABLET (75 MG TOTAL) BY MOUTH DAILY. 90 tablet 2  . erlotinib (TARCEVA) 150 MG tablet Take 1 tablet (150 mg total) by mouth daily. Take on an empty stomach 1 hour before meals or 2 hours after. 30 tablet 2  . glimepiride (AMARYL) 4 MG tablet Take 4 mg by mouth 2 (two) times daily.    . isosorbide mononitrate (IMDUR) 30 MG 24 hr tablet TAKE 1 TABLET BY MOUTH ONCE DAILY 90 tablet 1  . Loperamide HCl (IMODIUM A-D) 1 MG/7.5ML LIQD Take 7.5 mLs by mouth as directed.     Marland Kitchen losartan (COZAAR) 50 MG tablet Take 50 mg by mouth daily.    . metoprolol (LOPRESSOR) 100 MG tablet Take 100 mg by mouth daily. Take one tablet by mouth daily with or following a meal  4  . nitroGLYCERIN (NITROSTAT) 0.4 MG SL tablet Place 1 tablet (0.4 mg total) under the tongue every 5 (five) minutes as needed for chest pain (up to 3  doses). 25 tablet 3  . omeprazole (PRILOSEC) 20 MG capsule Take 20 mg by mouth 2 (two) times daily.  5  . sitaGLIPtin (JANUVIA) 100 MG tablet Take 100 mg by mouth daily.    . zaleplon (SONATA) 10 MG capsule Take 10 mg by mouth at bedtime.  5   No current facility-administered medications for this visit.    REVIEW OF SYSTEMS:  Constitutional: positive for fatigue and weight loss Eyes: negative Ears, nose, mouth, throat, and face: negative Respiratory: negative Cardiovascular: negative Gastrointestinal: negative Genitourinary:negative Integument/breast: negative Hematologic/lymphatic: negative Musculoskeletal:negative Neurological: negative Behavioral/Psych: negative Endocrine: negative Allergic/Immunologic: negative   PHYSICAL  EXAMINATION: General appearance: alert, cooperative, fatigued and no distress Head: Normocephalic, without obvious abnormality, atraumatic Neck: no adenopathy Lymph nodes: Cervical, supraclavicular, and axillary nodes normal. Resp: clear to auscultation bilaterally Back: symmetric, no curvature. ROM normal. No CVA tenderness. Cardio: regular rate and rhythm, S1, S2 normal, no murmur, click, rub or gallop GI: soft, non-tender; bowel sounds normal; no masses,  no organomegaly Extremities: extremities normal, atraumatic, no cyanosis or edema Neurologic: Alert and oriented X 3, normal strength and tone. Normal symmetric reflexes. Normal coordination and gait  ECOG PERFORMANCE STATUS: 1 - Symptomatic but completely ambulatory  Blood pressure (!) 115/93, pulse 77, temperature 97.6 F (36.4 C), temperature source Tympanic, resp. rate 17, height _0  (1.854 m), weight 200 lb 4.8 oz (90.9 kg), SpO2 100 %.  LABORATORY DATA: Lab Results  Component Value Date   WBC 10.0 06/18/2020   HGB 14.7 06/18/2020   HCT 43.6 06/18/2020   MCV 86.7 06/18/2020   PLT 237 06/18/2020      Chemistry      Component Value Date/Time   NA 138 06/18/2020 0906   NA 139 06/04/2017 0948   K 4.7 06/18/2020 0906   K 5.1 06/04/2017 0948   CL 104 06/18/2020 0906   CL 106 06/10/2012 0900   CO2 26 06/18/2020 0906   CO2 26 06/04/2017 0948   BUN 14 06/18/2020 0906   BUN 14.2 06/04/2017 0948   CREATININE 1.86 (H) 06/18/2020 0906   CREATININE 1.5 (H) 06/04/2017 0948      Component Value Date/Time   CALCIUM 9.5 06/18/2020 0906   CALCIUM 9.6 06/04/2017 0948   ALKPHOS 85 06/18/2020 0906   ALKPHOS 84 06/04/2017 0948   AST 20 06/18/2020 0906   AST 24 06/04/2017 0948   ALT 26 06/18/2020 0906   ALT 35 06/04/2017 0948   BILITOT 1.2 06/18/2020 0906   BILITOT 0.82 06/04/2017 0948       RADIOGRAPHIC STUDIES: CT Chest Wo Contrast  Result Date: 06/18/2020 CLINICAL DATA:  Primary Cancer Type: Lung Imaging Indication:  Restaging for new clinical signs or symptoms. Enlarging right lower lobe pulmonary nodule. Interval therapy since last imaging? Yes Initial Cancer Diagnosis Date: 07/2003; established by: Biopsy-proven Detailed Pathology: Stage IV non-small cell lung cancer, adenocarcinoma. Primary Tumor location: Left upper lobe. Metastases to right eighth and ninth ribs. Surgeries: Left upper lobe wedge resection 01/10/2005. Chemotherapy: Yes; Ongoing?  Yes, daily Erlotinib Immunotherapy? No Radiation therapy?  Yes; Date Range: 2006 EXAM: CT CHEST WITHOUT CONTRAST TECHNIQUE: Multidetector CT imaging of the chest was performed following the standard protocol without IV contrast. COMPARISON:  Most recent CT chest 12/16/2019. 12/13/2018, 06/14/2018. FINDINGS: Cardiovascular: Coronary artery calcifications and aortic atherosclerosis. Normal heart size. No pericardial effusion. Mediastinum/Nodes: Normal appearance of the thyroid gland. The trachea appears patent and is midline. Normal appearance of the esophagus. No enlarged mediastinal, axillary or supraclavicular lymph  nodes. Lungs/Pleura: No pleural effusion identified. Pulmonary nodule within the right lower lobe measures 1.1 by 0.9 cm, image 95/5. This is compared with 0.9 x 0.9 cm (when remeasured). Stable mild nodularity along the superior portion of the right major fissure, image 69/5. Postsurgical changes are identified within the left lung from previous wedge resection of the left upper lobe. There is nodular soft tissue thickening along the suture line measuring 1.3 x 0.8 cm, unchanged from previous exam. No new lung nodules identified. Upper Abdomen: No acute abnormality within the imaged portions of the upper abdomen. The adrenal glands are unremarkable. Small bilateral renal calcifications noted. Aortic atherosclerosis. Focal low-density structure within the posteroinferior right lobe of liver measures 0.9 cm and is unchanged from previous exam. Musculoskeletal: Mild  degenerative disc disease within the thoracic spine. Chronic right posterior seventh and eighth rib deformities appear unchanged from the previous exam. No acute or suspicious osseous findings. IMPRESSION: 1. Nodule within superior segment of right lower lobe is stable to mildly increased in the interval as described above. Findings are suspicious for bronchogenic carcinoma. 2. No adenopathy or distant metastatic disease within the chest. 3. Aortic Atherosclerosis (ICD10-I70.0) and Emphysema (ICD10-J43.9). Electronically Signed   By: Kerby Moors M.D.   On: 06/18/2020 11:18    ASSESSMENT/PLAN:  This is a very pleasant 69 years old white male with metastatic non-small cell lung cancer, adenocarcinoma with positive EGFR mutation and he is currently on treatment with Tarceva since 2006.  This was a switch of 2 months ago to the generic Erlotinib and he is tolerating it well except for few episodes of diarrhea and mild skin rash. The patient continues to tolerate his treatment well. He had repeat CT scan of the chest performed recently.  I personally and independently reviewed the scan images and discussed the results and showed the images to the patient today. He has a nodule in the superior segment of the right lower lobe slightly increased in size and suspicious for bronchogenic carcinoma. I recommended for the patient to have a PET scan for further evaluation of this lesion. I will see him back for follow-up visit in around 3 weeks for evaluation and more detailed discussion of his treatment options based on the PET scan results. For now he will continue his current treatment with erlotinib. For the renal insufficiency he is followed by his primary care physician.  He will need evaluation by nephrology. The patient was advised to call immediately if he has any concerning symptoms in the interval. All questions were answered. The patient knows to call the clinic with any problems, questions or  concerns. We can certainly see the patient much sooner if necessary.  Disclaimer: This note was dictated with voice recognition software. Similar sounding words can inadvertently be transcribed and may not be corrected upon review.

## 2020-06-22 ENCOUNTER — Telehealth: Payer: Self-pay | Admitting: Internal Medicine

## 2020-06-22 NOTE — Telephone Encounter (Signed)
Scheduled per los. Called and left msg. Mailed printout  °

## 2020-06-28 ENCOUNTER — Encounter: Payer: Self-pay | Admitting: Nurse Practitioner

## 2020-06-28 ENCOUNTER — Ambulatory Visit: Payer: Medicare Other | Admitting: Nurse Practitioner

## 2020-06-28 ENCOUNTER — Other Ambulatory Visit: Payer: Self-pay

## 2020-06-28 VITALS — BP 120/70 | HR 84 | Ht 73.0 in | Wt 199.2 lb

## 2020-06-28 DIAGNOSIS — E1122 Type 2 diabetes mellitus with diabetic chronic kidney disease: Secondary | ICD-10-CM | POA: Diagnosis not present

## 2020-06-28 DIAGNOSIS — I259 Chronic ischemic heart disease, unspecified: Secondary | ICD-10-CM | POA: Diagnosis not present

## 2020-06-28 DIAGNOSIS — E782 Mixed hyperlipidemia: Secondary | ICD-10-CM

## 2020-06-28 DIAGNOSIS — I714 Abdominal aortic aneurysm, without rupture, unspecified: Secondary | ICD-10-CM

## 2020-06-28 DIAGNOSIS — N183 Chronic kidney disease, stage 3 unspecified: Secondary | ICD-10-CM

## 2020-06-28 DIAGNOSIS — I10 Essential (primary) hypertension: Secondary | ICD-10-CM

## 2020-06-28 MED ORDER — ISOSORBIDE MONONITRATE ER 60 MG PO TB24: 60.0000 mg | ORAL_TABLET | Freq: Every day | ORAL | 3 refills | Status: DC

## 2020-06-28 NOTE — Patient Instructions (Addendum)
After Visit Summary:  We will be checking the following labs today - NONE   Medication Instructions:    Continue with your current medicines. BUT  I am cutting the Losartan back to just 25 mg a day - half a tablet daily  I am increasing the Imdur to 60 mg a day - you can take 2 of your 30 mg tablets - I have sent the RX for the 60 mg to [facility] in [place]   If you need a refill on your cardiac medications before your next appointment, please call your pharmacy.     Testing/Procedures To Be Arranged:  Lexiscan    You are scheduled for a Myocardial Perfusion Imaging Study   Please arrive 15 minutes prior to your appointment time for registration and insurance purposes.  The test will take approximately 3 to 4 hours to complete; you may bring reading material.  If someone comes with you to your appointment, they will need to remain in the main lobby due to limited space in the testing area. **If you are pregnant or breastfeeding, please notify the nuclear lab prior to your appointment**  How to prepare for your Myocardial Perfusion Test: . Do not eat or drink 3 hours prior to your test, except you may have water. . Do not consume products containing caffeine (regular or decaffeinated) 12 hours prior to your test. (ex: coffee, chocolate, sodas, tea). . Do bring a list of your current medications with you.  If not listed below, you may take your medications as normal.  . Do not take ANY DIABETIC medications on the morning of your test . Do wear comfortable clothes (no dresses or overalls) and walking shoes, tennis shoes preferred (No heels or open toe shoes are allowed). . Do NOT wear cologne, perfume, aftershave, or lotions (deodorant is allowed). . If these instructions are not followed, your test will have to be rescheduled.     Follow-Up:   See Dr. Burt Knack or me in about a month for follow up.     At [facility], you and your health needs are our priority.  As part of our  continuing mission to provide you with exceptional heart care, we have created designated Provider Care Teams.  These Care Teams include your primary Cardiologist (physician) and Advanced Practice Providers (APPs -  Physician Assistants and Nurse Practitioners) who all work together to provide you with the care you need, when you need it.  Special Instructions:  . Stay safe, wash your hands for at least 20 seconds and wear a mask when needed.  . It was good to talk with you today.    Call the [place] office at [phone] if you have any questions, problems or concerns.

## 2020-07-06 ENCOUNTER — Telehealth (HOSPITAL_COMMUNITY): Payer: Self-pay | Admitting: *Deleted

## 2020-07-06 NOTE — Telephone Encounter (Signed)
Patient given detailed instructions per Myocardial Perfusion Study Information Sheet for the test on 07/14/2020 at 1045. Patient notified to arrive 15 minutes early and that it is imperative to arrive on time for appointment to keep from having the test rescheduled.  If you need to cancel or reschedule your appointment, please call the office within 24 hours of your appointment. . Patient verbalized understanding.[name], Ranae Palms No mychart available.

## 2020-07-09 ENCOUNTER — Ambulatory Visit (HOSPITAL_COMMUNITY)
Admission: RE | Admit: 2020-07-09 | Discharge: 2020-07-09 | Disposition: A | Discharge status: Home / Self Care | Payer: Medicare Other | Source: Ambulatory Visit | Attending: Internal Medicine | Admitting: Internal Medicine

## 2020-07-09 ENCOUNTER — Other Ambulatory Visit: Payer: Self-pay

## 2020-07-09 DIAGNOSIS — J439 Emphysema, unspecified: Secondary | ICD-10-CM | POA: Insufficient documentation

## 2020-07-09 DIAGNOSIS — J984 Other disorders of lung: Secondary | ICD-10-CM | POA: Insufficient documentation

## 2020-07-09 DIAGNOSIS — C3490 Malignant neoplasm of unspecified part of unspecified bronchus or lung: Secondary | ICD-10-CM | POA: Insufficient documentation

## 2020-07-09 DIAGNOSIS — M5137 Other intervertebral disc degeneration, lumbosacral region: Secondary | ICD-10-CM | POA: Insufficient documentation

## 2020-07-09 DIAGNOSIS — J320 Chronic maxillary sinusitis: Secondary | ICD-10-CM | POA: Insufficient documentation

## 2020-07-09 DIAGNOSIS — I700 Atherosclerosis of aorta: Secondary | ICD-10-CM | POA: Insufficient documentation

## 2020-07-09 DIAGNOSIS — I2510 Atherosclerotic heart disease of native coronary artery without angina pectoris: Secondary | ICD-10-CM | POA: Diagnosis not present

## 2020-07-09 DIAGNOSIS — I714 Abdominal aortic aneurysm, without rupture: Secondary | ICD-10-CM | POA: Insufficient documentation

## 2020-07-09 LAB — GLUCOSE, CAPILLARY: Glucose-Capillary: 104 mg/dL — ABNORMAL HIGH (ref 70–99)

## 2020-07-09 MED ORDER — FLUDEOXYGLUCOSE F - 18 (FDG) INJECTION
9.9900 | Freq: Once | INTRAVENOUS | Status: AC
  Administered 2020-07-09: 9.99 via INTRAVENOUS

## 2020-07-13 ENCOUNTER — Encounter: Payer: Self-pay | Admitting: Internal Medicine

## 2020-07-13 ENCOUNTER — Other Ambulatory Visit: Payer: Self-pay

## 2020-07-13 ENCOUNTER — Inpatient Hospital Stay: Payer: Medicare Other | Admitting: Internal Medicine

## 2020-07-13 VITALS — BP 140/66 | HR 77 | Temp 97.8°F | Resp 18 | Ht 73.0 in | Wt 197.9 lb

## 2020-07-13 DIAGNOSIS — C3412 Malignant neoplasm of upper lobe, left bronchus or lung: Secondary | ICD-10-CM | POA: Diagnosis not present

## 2020-07-13 DIAGNOSIS — Z5181 Encounter for therapeutic drug level monitoring: Secondary | ICD-10-CM | POA: Diagnosis not present

## 2020-07-13 DIAGNOSIS — C3490 Malignant neoplasm of unspecified part of unspecified bronchus or lung: Secondary | ICD-10-CM | POA: Diagnosis not present

## 2020-07-13 DIAGNOSIS — I10 Essential (primary) hypertension: Secondary | ICD-10-CM | POA: Diagnosis not present

## 2020-07-13 NOTE — Progress Notes (Signed)
CARDIOLOGY OFFICE NOTE  Date:  07/27/2020    [name] Date of Birth: [birth year] Medical Record #[id number]  PCP:  [name], DO  Cardiologist:  Jerel Shepherd    Chief Complaint  Patient presents with  . Follow-up    Seen for Dr. Burt Knack    History of Present Illness: [name] is a 69 y.o. male who presents today for a follow up visit. Seen for Dr. Burt Knack.    He has a history of known CAD with remote PCI of the RCA and LCX in 2004, s/p cath with stenting of the LCX due to ISR in 2015. He has had a history of metastatic lung cancer - found in 2004.    Other issues include lung lesion, AAA (last imaged in 04/2020), HTN, HLD, DM, COPD and HLD.    Last seen by Dr. Burt Knack in November of 2020 - stable cardiac status. Had been found to have a small lung lesion that was concerning for malignancy but too small for PET scanning at that time. He was to have repeat scanning. I saw him last month - had had recurrent CT and was waiting to have PET - most likely going to need chemotherapy. Did not feel good. Losing weight without trying. More chest discomfort with worrying/anxiety and even walking the dog - somewhat similar to his prior chest pain syndrome. I increased his Imdur and arranged for Lexiscan.    Comes in today. Here alone. He is doing better in regards to his chest pain. Feels better with the increase in the Imdur. He has had his PET - was thinking he may have radiation - to continue with that current regimen with scan in 6 months - slow growing area. BP is ok. We reviewed his Myoview.   Past Medical History:  Diagnosis Date  . AAA (abdominal aortic aneurysm) (HCC)    a. 3.4 x 3.5 on scan 02/28/13.  . Allergic reaction to contrast dye   . Atherosclerosis   . CAD (coronary artery disease)    a. stenting of RCA/LCx 2004. b. [place] 08/2013:  s/p DES to LCx for severe ISR.  . Diabetes mellitus   . Diverticulosis   . Emphysema lung ([place])   . Emphysema of lung  ([place])   . Encounter for therapeutic drug monitoring 06/06/2016  . Hemorrhoids   . Hepatic steatosis   . HTN (hypertension)   . Hyperlipidemia   . Kidney stone   . Lung cancer ([place]) dx'd 2004   chemo/xrt comp; tarceva ongoing    Past Surgical History:  Procedure Laterality Date  . ANGIOPLASTY / STENTING FEMORAL  2015   2004-groin  . LEFT HEART CATHETERIZATION WITH CORONARY ANGIOGRAM N/A 09/08/2013   Procedure: LEFT HEART CATHETERIZATION WITH CORONARY ANGIOGRAM;  Surgeon: Blane Ohara, MD;  [facility] C[facility] LAB;  Service: Cardiovascular;  Laterality: N/A;  . left partial lobectomy    . WISDOM TOOTH EXTRACTION       Medications: Current Meds  Medication Sig  . ACCU-CHEK AVIVA PLUS test strip 1 each daily.  Marland Kitchen aspirin EC 81 MG tablet Take 81 mg by mouth daily. Swallow whole.  Marland Kitchen atorvastatin (LIPITOR) 40 MG tablet Take 40 mg by mouth daily.  . cetirizine (ZYRTEC) 10 MG chewable tablet Chew 10 mg by mouth daily as needed for allergies.  Marland Kitchen clopidogrel (PLAVIX) 75 MG tablet TAKE 1 TABLET (75 MG TOTAL) BY MOUTH DAILY.  Marland Kitchen Cyanocobalamin (VITAMIN B 12 PO)  Take by mouth. Taking daily  . erlotinib (TARCEVA) 150 MG tablet Take 1 tablet (150 mg total) by mouth daily. Take on an empty stomach 1 hour before meals or 2 hours after.  Marland Kitchen glimepiride (AMARYL) 4 MG tablet Take 4 mg by mouth 2 (two) times daily.  . isosorbide mononitrate (IMDUR) 60 MG 24 hr tablet Take 1 tablet (60 mg total) by mouth daily.  Marland Kitchen JARDIANCE 25 MG TABS tablet Take 25 mg by mouth daily.  . Lancets (FREESTYLE) lancets 1 each daily.  Marland Kitchen losartan (COZAAR) 50 MG tablet Take 25 mg by mouth daily.  . metoprolol (LOPRESSOR) 100 MG tablet Take 100 mg by mouth daily. Take one tablet by mouth daily with or following a meal  . nitroGLYCERIN (NITROSTAT) 0.4 MG SL tablet Place 1 tablet (0.4 mg total) under the tongue every 5 (five) minutes as needed for chest pain (up to 3 doses).  Marland Kitchen omeprazole (PRILOSEC) 20 MG capsule Take 20 mg by  mouth 2 (two) times daily.  . TRADJENTA 5 MG TABS tablet Take 5 mg by mouth daily.  . zaleplon (SONATA) 10 MG capsule Take 10 mg by mouth at bedtime.     Allergies: Allergies  Allergen Reactions  . Iohexol Other (See Comments)     Code: HIVES, Desc: pt recieves 50mg  benadryl 1 hour prior to scan   . Morphine And Related Anxiety    hallucinations  . Other Rash    chlorascrub when starting IV    Social History: The patient  reports that he quit smoking about 17 years ago. He has never used smokeless tobacco. He reports that he does not drink alcohol and does not use drugs.   Family History: The patient's family history includes Diabetes in his sister; Heart disease in his sister; Ovarian cancer in his sister.   Review of Systems: Please see the history of present illness.   All other systems are reviewed and negative.   Physical Exam: VS:  BP 110/60   Pulse 73   Ht 6\' 1"  (1.854 m)   Wt 196 lb 9.6 oz (89.2 kg)   SpO2 97%   BMI 25.94 kg/m  .  BMI Body mass index is 25.94 kg/m.  Wt Readings from Last 3 Encounters:  07/27/20 196 lb 9.6 oz (89.2 kg)  07/14/20 199 lb (90.3 kg)  07/13/20 197 lb 14.4 oz (89.8 kg)    General: Pleasant. Alert and in no acute distress.   Cardiac: Regular rate and rhythm. No murmurs, rubs, or gallops. No edema.  Respiratory:  Lungs are clear to auscultation bilaterally with normal work of breathing.  GI: Soft and nontender.  MS: No deformity or atrophy. Gait and ROM intact.  Skin: Warm and dry. Color is normal.  Neuro:  Strength and sensation are intact and no gross focal deficits noted.  Psych: Alert, appropriate and with normal affect.   LABORATORY DATA:  EKG:  EKG is not ordered today.   Lab Results  Component Value Date   WBC 10.0 06/18/2020   HGB 14.7 06/18/2020   HCT 43.6 06/18/2020   PLT 237 06/18/2020   GLUCOSE 124 (H) 06/18/2020   ALT 26 06/18/2020   AST 20 06/18/2020   NA 138 06/18/2020   K 4.7 06/18/2020   CL 104  06/18/2020   CREATININE 1.86 (H) 06/18/2020   BUN 14 06/18/2020   CO2 26 06/18/2020   INR 1.0 09/03/2013       BNP (last 3 results) No results for  input(s): BNP in the last 8760 hours.  ProBNP (last 3 results) No results for input(s): PROBNP in the last 8760 hours.   Other Studies Reviewed Today:  Myoview Study Highlights 07/2020    Nuclear stress EF: 46%. Mild generalized hypokinesis.  There was no ST segment deviation noted during stress.  There is mild reduced uptake noted along the inferior wall distribution seen at both rest and partially at stress. No large territory ischemia identified.  This is an intermediate risk study based upon mildly reduced ejection fraction of 46%.   Candee Furbish, MD  Sherren Mocha, MD  07/18/2020 9:16 AM EST      Yes - I agree. Without major ischemia would continue with medical management. thanks     AAA DOPPLER 04/2020 Summary:  Abdominal Aorta: There is evidence of abnormal dilatation of the distal  Abdominal aorta. The largest aortic measurement is 3.8 cm. The largest  aortic diameter remains essentially unchanged compared to prior exam.  Previous diameter measurement was 3.8 cm      Echo Study Conclusions 05/2017  - Left ventricle: The cavity size was normal. Wall thickness was    increased in a pattern of mild LVH. Systolic function was mildly    reduced. The estimated ejection fraction was in the range of 45%    to 50%. Diffuse hypokinesis. Doppler parameters are consistent    with abnormal left ventricular relaxation (grade 1 diastolic    dysfunction). Doppler parameters are consistent with    indeterminate ventricular filling pressure.  - Aortic valve: Transvalvular velocity was within the normal range.    There was no stenosis. There was moderate regurgitation.    Regurgitation pressure half-time: 472 ms.  - Mitral valve: Transvalvular velocity was within the normal range.    There was no evidence for stenosis.  There was no regurgitation.  - Right ventricle: The cavity size was normal. Wall thickness was    normal. Systolic function was normal.  - Atrial septum: No defect or patent foramen ovale was identified.  - Tricuspid valve: There was no regurgitation.    Final Cath Conclusions 2015:  1. 2 vessel CAD with patency of the previously implanted RCA stent and severe ISR of the LCx stent.  2. Normal LV function  3. Successful PCI of the LCx with a single DES  4. Patency of the LAD without significant stenosis  Recommendations:  DAPT with ASA and plavix for at least 12 months.  Sherren Mocha  09/08/2013, 3:02 PM     ASSESSMENT & PLAN:     1. CAD - recent flare of more angina - now on more Imdur - stable and not high risk Myoview just completed - he remains on DAPT due to his 1st generation stent and recurrent ACS. I do think some anxiety was also playing a role.   2. HLD - on statin.   3. AAA - measured off recent PET and stable.   4. Metastatic non small cell lung cancer, adenocarcinoma - recent scan noted - he is thinking he would like to talk with radiation oncology and get their input - he will reach out to Dr. Earlie Server.   5. HTN - BP is ok. No changes made today. Losartan was cut back at last visit with the increase in the Imdur due to some fleeting dizziness - no longer endorsed.   6. Weight loss - down 16 pounds over this past year. Not discussed.   Current medicines are reviewed with the patient today.  The patient does not have concerns regarding medicines other than what has been noted above.  The following changes have been made:  See above.  Labs/ tests ordered today include:   No orders of the defined types were placed in this encounter.    Disposition:   FU with Dr. Burt Knack in 6 months.   Patient is agreeable to this plan and will call if any problems develop in the interim.   SignedTruitt Merle, NP  07/27/2020 11:24 AM  [facility] [address] [place], [place]  [address] Phone: [phone] Fax: [phone]

## 2020-07-13 NOTE — Progress Notes (Signed)
[facility] Telephone:(336) [phone]   Fax:(336) [phone]  OFFICE PROGRESS NOTE  [name], DO [address] [place] [address]  DIAGNOSIS: Metastatic non-small cell lung cancer, adenocarcinoma initially diagnosed in December of 2004  PRIOR THERAPY: 1) status post course of concurrent chemoradiation under the care of Dr. Truddie Coco. 2) status post left upper lobe wedge resection on 01/10/2005 for recurrent disease in the left lung.  CURRENT THERAPY: Erlotinib 150 mg by mouth daily started in 2006.  INTERVAL HISTORY:  [name] 69 y.o. male returns to the clinic today for follow-up visit.  The patient is feeling fine today with no concerning complaints except for few more pounds of weight loss.  He denied having any chest pain, shortness of breath, cough or hemoptysis.  He denied having any nausea, vomiting, diarrhea or constipation.  He has no headache or visual changes.  He denied having any fever or chills.  He continues to tolerate his treatment with Lorlatinib fairly well.  The patient had repeat PET scan for evaluation of right lower lobe lung nodule and is here for evaluation and discussion of his scan results and treatment options.  MEDICAL HISTORY: Past Medical History:  Diagnosis Date  . AAA (abdominal aortic aneurysm) (HCC)    a. 3.4 x 3.5 on scan 02/28/13.  . Allergic reaction to contrast dye   . Atherosclerosis   . CAD (coronary artery disease)    a. stenting of RCA/LCx 2004. b. [place] 08/2013:  s/p DES to LCx for severe ISR.  . Diabetes mellitus   . Diverticulosis   . Emphysema lung ([place])   . Emphysema of lung ([place])   . Encounter for therapeutic drug monitoring 06/06/2016  . Hemorrhoids   . Hepatic steatosis   . HTN (hypertension)   . Hyperlipidemia   . Kidney stone   . Lung cancer ([place]) dx'd 2004   chemo/xrt comp; tarceva ongoing    ALLERGIES:  is allergic to iohexol, morphine and related, and other.  MEDICATIONS:  Current Outpatient  Medications  Medication Sig Dispense Refill  . ACCU-CHEK AVIVA PLUS test strip 1 each daily.    Marland Kitchen amLODipine (NORVASC) 5 MG tablet Take 1 tablet (5 mg total) by mouth daily. 90 tablet 3  . aspirin 81 MG chewable tablet Chew 81 mg by mouth daily.    Marland Kitchen atorvastatin (LIPITOR) 40 MG tablet Take 40 mg by mouth daily.    . cetirizine (ZYRTEC) 10 MG chewable tablet Chew 10 mg by mouth daily as needed for allergies.    Marland Kitchen clopidogrel (PLAVIX) 75 MG tablet TAKE 1 TABLET (75 MG TOTAL) BY MOUTH DAILY. 90 tablet 2  . erlotinib (TARCEVA) 150 MG tablet Take 1 tablet (150 mg total) by mouth daily. Take on an empty stomach 1 hour before meals or 2 hours after. 30 tablet 2  . glimepiride (AMARYL) 4 MG tablet Take 4 mg by mouth 2 (two) times daily.    . isosorbide mononitrate (IMDUR) 60 MG 24 hr tablet Take 1 tablet (60 mg total) by mouth daily. 90 tablet 3  . JARDIANCE 25 MG TABS tablet Take 25 mg by mouth daily.    . Lancets (FREESTYLE) lancets 1 each daily.    Marland Kitchen losartan (COZAAR) 50 MG tablet Take 25 mg by mouth daily.    . metoprolol (LOPRESSOR) 100 MG tablet Take 100 mg by mouth daily. Take one tablet by mouth daily with or following a meal  4  . nitroGLYCERIN (  NITROSTAT) 0.4 MG SL tablet Place 1 tablet (0.4 mg total) under the tongue every 5 (five) minutes as needed for chest pain (up to 3 doses). 25 tablet 3  . omeprazole (PRILOSEC) 20 MG capsule Take 20 mg by mouth 2 (two) times daily.  5  . TRADJENTA 5 MG TABS tablet Take 5 mg by mouth daily.    . zaleplon (SONATA) 10 MG capsule Take 10 mg by mouth at bedtime.  5   No current facility-administered medications for this visit.    REVIEW OF SYSTEMS:  Constitutional: positive for fatigue and weight loss Eyes: negative Ears, nose, mouth, throat, and face: negative Respiratory: negative Cardiovascular: negative Gastrointestinal: negative Genitourinary:negative Integument/breast: negative Hematologic/lymphatic:  negative Musculoskeletal:negative Neurological: negative Behavioral/Psych: negative Endocrine: negative Allergic/Immunologic: negative   PHYSICAL EXAMINATION: General appearance: alert, cooperative, fatigued and no distress Head: Normocephalic, without obvious abnormality, atraumatic Neck: no adenopathy Lymph nodes: Cervical, supraclavicular, and axillary nodes normal. Resp: clear to auscultation bilaterally Back: symmetric, no curvature. ROM normal. No CVA tenderness. Cardio: regular rate and rhythm, S1, S2 normal, no murmur, click, rub or gallop GI: soft, non-tender; bowel sounds normal; no masses,  no organomegaly Extremities: extremities normal, atraumatic, no cyanosis or edema Neurologic: Alert and oriented X 3, normal strength and tone. Normal symmetric reflexes. Normal coordination and gait  ECOG PERFORMANCE STATUS: 1 - Symptomatic but completely ambulatory  Blood pressure 140/66, pulse 77, temperature 97.8 F (36.6 C), temperature source Tympanic, resp. rate 18, height 6\' 1"  (1.854 m), weight 197 lb 14.4 oz (89.8 kg), SpO2 99 %.  LABORATORY DATA: Lab Results  Component Value Date   WBC 10.0 06/18/2020   HGB 14.7 06/18/2020   HCT 43.6 06/18/2020   MCV 86.7 06/18/2020   PLT 237 06/18/2020      Chemistry      Component Value Date/Time   NA 138 06/18/2020 0906   NA 139 06/04/2017 0948   K 4.7 06/18/2020 0906   K 5.1 06/04/2017 0948   CL 104 06/18/2020 0906   CL 106 06/10/2012 0900   CO2 26 06/18/2020 0906   CO2 26 06/04/2017 0948   BUN 14 06/18/2020 0906   BUN 14.2 06/04/2017 0948   CREATININE 1.86 (H) 06/18/2020 0906   CREATININE 1.5 (H) 06/04/2017 0948      Component Value Date/Time   CALCIUM 9.5 06/18/2020 0906   CALCIUM 9.6 06/04/2017 0948   ALKPHOS 85 06/18/2020 0906   ALKPHOS 84 06/04/2017 0948   AST 20 06/18/2020 0906   AST 24 06/04/2017 0948   ALT 26 06/18/2020 0906   ALT 35 06/04/2017 0948   BILITOT 1.2 06/18/2020 0906   BILITOT 0.82 06/04/2017  0948       RADIOGRAPHIC STUDIES: CT Chest Wo Contrast  Result Date: 06/18/2020 CLINICAL DATA:  Primary Cancer Type: Lung Imaging Indication: Restaging for new clinical signs or symptoms. Enlarging right lower lobe pulmonary nodule. Interval therapy since last imaging? Yes Initial Cancer Diagnosis Date: 07/2003; established by: Biopsy-proven Detailed Pathology: Stage IV non-small cell lung cancer, adenocarcinoma. Primary Tumor location: Left upper lobe. Metastases to right eighth and ninth ribs. Surgeries: Left upper lobe wedge resection 01/10/2005. Chemotherapy: Yes; Ongoing?  Yes, daily Erlotinib Immunotherapy? No Radiation therapy?  Yes; Date Range: 2006 EXAM: CT CHEST WITHOUT CONTRAST TECHNIQUE: Multidetector CT imaging of the chest was performed following the standard protocol without IV contrast. COMPARISON:  Most recent CT chest 12/16/2019. 12/13/2018, 06/14/2018. FINDINGS: Cardiovascular: Coronary artery calcifications and aortic atherosclerosis. Normal heart size. No pericardial effusion. Mediastinum/Nodes:  Normal appearance of the thyroid gland. The trachea appears patent and is midline. Normal appearance of the esophagus. No enlarged mediastinal, axillary or supraclavicular lymph nodes. Lungs/Pleura: No pleural effusion identified. Pulmonary nodule within the right lower lobe measures 1.1 by 0.9 cm, image 95/5. This is compared with 0.9 x 0.9 cm (when remeasured). Stable mild nodularity along the superior portion of the right major fissure, image 69/5. Postsurgical changes are identified within the left lung from previous wedge resection of the left upper lobe. There is nodular soft tissue thickening along the suture line measuring 1.3 x 0.8 cm, unchanged from previous exam. No new lung nodules identified. Upper Abdomen: No acute abnormality within the imaged portions of the upper abdomen. The adrenal glands are unremarkable. Small bilateral renal calcifications noted. Aortic atherosclerosis. Focal  low-density structure within the posteroinferior right lobe of liver measures 0.9 cm and is unchanged from previous exam. Musculoskeletal: Mild degenerative disc disease within the thoracic spine. Chronic right posterior seventh and eighth rib deformities appear unchanged from the previous exam. No acute or suspicious osseous findings. IMPRESSION: 1. Nodule within superior segment of right lower lobe is stable to mildly increased in the interval as described above. Findings are suspicious for bronchogenic carcinoma. 2. No adenopathy or distant metastatic disease within the chest. 3. Aortic Atherosclerosis (ICD10-I70.0) and Emphysema (ICD10-J43.9). Electronically Signed   By: Kerby Moors M.D.   On: 06/18/2020 11:18   NM PET Image Restag (PS) Skull Base To Thigh  Result Date: 07/09/2020 CLINICAL DATA:  Subsequent treatment strategy for non-small cell lung cancer. Enlarging right pulmonary nodule. History of left upper lobe wedge resection. EXAM: NUCLEAR MEDICINE PET SKULL BASE TO THIGH TECHNIQUE: 10.0 mCi F-18 FDG was injected intravenously. Full-ring PET imaging was performed from the skull base to thigh after the radiotracer. CT data was obtained and used for attenuation correction and anatomic localization. Fasting blood glucose: 104 mg/dl COMPARISON:  Multiple exams, including CT chest from 06/18/2020 and prior PET-CT from 02/27/2008 FINDINGS: Mediastinal blood pool activity: SUV max 2.8 Liver activity: SUV max NA NECK: No significant abnormal hypermetabolic activity in this region. Incidental CT findings: Chronic bilateral maxillary sinusitis. There is atherosclerotic calcification of the cavernous carotid arteries bilaterally. Bilateral common carotid atherosclerotic calcification. CHEST: Mild nodularity along the staple line in the left upper lobe has maximum SUV of 1.8, favoring benign process. In the vicinity of the 0.8 by 0.9 by 0.6 cm right lower lobe pulmonary nodule, maximum SUV is 1.3. Minimal  nodularity along the right major fissure measures only about 4 by 5 mm and demonstrates no accentuated metabolic activity, but is below sensitive PET-CT size thresholds. Incidental CT findings: Centrilobular emphysema. Coronary, aortic arch, and branch vessel atherosclerotic vascular disease. ABDOMEN/PELVIS: No significant abnormal hypermetabolic activity in this region. Incidental CT findings: Nonspecific 8 mm hypodense lesion in the right hepatic lobe on image 114 of series 4, stable. Nonobstructive bilateral nephrolithiasis. Aortoiliac atherosclerotic vascular disease. Fusiform infrarenal abdominal aortic aneurysm measuring up to 3.9 cm in diameter on image 151 of series 4. SKELETON: No hypermetabolic activity associated with the chronic right seventh and eighth rib deformities which represent prior healed metastatic lesions. Incidental CT findings: Degenerative disc disease at L5-S1. IMPRESSION: 1. The small nodule in the right lower lobe has a maximum SUV of only 1.3, but has been steadily enlarging, suspicious for a low-grade adenocarcinoma. Chronic scarring in the left upper lobe. 2. No other findings of active malignancy. 3. Infrarenal abdominal aortic aneurysm, 3.9 cm in diameter. Recommend follow-up  ultrasound every 2 years. This recommendation follows ACR consensus guidelines: White Paper of the ACR Incidental Findings Committee [name] on Vascular Findings. J Am Coll Radiol 2013; 10:789-794. 4. Other imaging findings of potential clinical significance: Aortic Atherosclerosis (ICD10-I70.0) and Emphysema (ICD10-J43.9). Coronary atherosclerosis. Degenerative disc disease at L5-S1. Chronic bilateral maxillary sinusitis. Electronically Signed   By: Van Clines M.D.   On: 07/09/2020 19:55    ASSESSMENT/PLAN:  This is a very pleasant 69 years old white male with metastatic non-small cell lung cancer, adenocarcinoma with positive EGFR mutation and he is currently on treatment with Tarceva since 2006.  This  was a switch of 2 months ago to the generic Erlotinib and he is tolerating it well except for few episodes of diarrhea and mild skin rash. He has been tolerating this treatment well with no concerning adverse effects. Recent CT scan of the chest showed slightly enlarging right lower lobe lung nodule.  I order a PET scan which was performed recently. I personally and independently reviewed the scan images and discussed the results with the patient today. His scan showed low hypermetabolic activity in the right lower lobe lung nodule but because of the slowly enlarging size over the last year, still suspicious for bronchogenic carcinoma. I recommended for the patient to continue with his current treatment with erlotinib.  I also gave him the option of referral to radiation oncology for consideration of SBRT to this nodule but he would like to continue with alectinib for now and consider the radiation in the future. I will see him back for follow-up visit in 6 months with repeat CT scan of the chest for restaging of his disease. The patient was advised to call immediately if he has any other concerning symptoms in the interval.  All questions were answered. The patient knows to call the clinic with any problems, questions or concerns. We can certainly see the patient much sooner if necessary.  Disclaimer: This note was dictated with voice recognition software. Similar sounding words can inadvertently be transcribed and may not be corrected upon review.

## 2020-07-14 ENCOUNTER — Ambulatory Visit (HOSPITAL_COMMUNITY): Payer: Medicare Other | Attending: Cardiology

## 2020-07-14 ENCOUNTER — Telehealth: Payer: Self-pay | Admitting: Internal Medicine

## 2020-07-14 DIAGNOSIS — I259 Chronic ischemic heart disease, unspecified: Secondary | ICD-10-CM | POA: Diagnosis not present

## 2020-07-14 LAB — MYOCARDIAL PERFUSION IMAGING
LV dias vol: 88 mL (ref 62–150)
LV sys vol: 47 mL
Peak HR: 76 {beats}/min
Rest HR: 57 {beats}/min
SDS: 0
SRS: 0
SSS: 0
TID: 1.01

## 2020-07-14 MED ORDER — TECHNETIUM TC 99M TETROFOSMIN IV KIT
10.9000 | PACK | Freq: Once | INTRAVENOUS | Status: AC | PRN
  Administered 2020-07-14: 10.9 via INTRAVENOUS
  Filled 2020-07-14: qty 11

## 2020-07-14 MED ORDER — REGADENOSON 0.4 MG/5ML IV SOLN
0.4000 mg | Freq: Once | INTRAVENOUS | Status: AC
  Administered 2020-07-14: 0.4 mg via INTRAVENOUS

## 2020-07-14 MED ORDER — TECHNETIUM TC 99M TETROFOSMIN IV KIT
31.3000 | PACK | Freq: Once | INTRAVENOUS | Status: AC | PRN
  Administered 2020-07-14: 31.3 via INTRAVENOUS
  Filled 2020-07-14: qty 32

## 2020-07-14 NOTE — Telephone Encounter (Signed)
Scheduled per los. Called and left msg. Mailed printout  °

## 2020-07-27 ENCOUNTER — Other Ambulatory Visit: Payer: Self-pay | Admitting: Internal Medicine

## 2020-07-27 ENCOUNTER — Other Ambulatory Visit: Payer: Self-pay

## 2020-07-27 ENCOUNTER — Ambulatory Visit: Payer: Medicare Other | Admitting: Nurse Practitioner

## 2020-07-27 ENCOUNTER — Encounter: Payer: Self-pay | Admitting: Nurse Practitioner

## 2020-07-27 ENCOUNTER — Telehealth: Payer: Self-pay | Admitting: Medical Oncology

## 2020-07-27 VITALS — BP 110/60 | HR 73 | Ht 73.0 in | Wt 196.6 lb

## 2020-07-27 DIAGNOSIS — E782 Mixed hyperlipidemia: Secondary | ICD-10-CM | POA: Diagnosis not present

## 2020-07-27 DIAGNOSIS — I10 Essential (primary) hypertension: Secondary | ICD-10-CM | POA: Diagnosis not present

## 2020-07-27 DIAGNOSIS — C3412 Malignant neoplasm of upper lobe, left bronchus or lung: Secondary | ICD-10-CM

## 2020-07-27 DIAGNOSIS — I714 Abdominal aortic aneurysm, without rupture, unspecified: Secondary | ICD-10-CM

## 2020-07-27 DIAGNOSIS — I259 Chronic ischemic heart disease, unspecified: Secondary | ICD-10-CM | POA: Diagnosis not present

## 2020-07-27 NOTE — Telephone Encounter (Signed)
Requests referral to xrt for "spots on lung"  '

## 2020-07-27 NOTE — Patient Instructions (Signed)
After Visit Summary:  We will be checking the following labs today - NONE   Medication Instructions:    Continue with your current medicines.    If you need a refill on your cardiac medications before your next appointment, please call your pharmacy.     Testing/Procedures To Be Arranged:  N/A  Follow-Up:   See Dr. Burt Knack in about 6 months.     At [facility], you and your health needs are our priority.  As part of our continuing mission to provide you with exceptional heart care, we have created designated Provider Care Teams.  These Care Teams include your primary Cardiologist (physician) and Advanced Practice Providers (APPs -  Physician Assistants and Nurse Practitioners) who all work together to provide you with the care you need, when you need it.  Special Instructions:  . Stay safe, wash your hands for at least 20 seconds and wear a mask when needed.  . It was good to talk with you today.  . Call Dr. Lew Dawes nurse and ask about the referral to radiation oncology   Call the [place] office at [phone] if you have any questions, problems or concerns.

## 2020-08-02 NOTE — Progress Notes (Incomplete)
Patient here for a consult with Dr. Sondra Come.  Thoracic Location of Tumor / Histology: RLL  Patient presented for f/u visit and 07/09/2020 PET scan showed low hypermetabolic activity in the right lower lobe lung nodule but because of the slowly enlarging size over the last year, still suspicious for bronchogenic carcinoma.  Biopsies of Metastatic non-small cell lung cancer, adenocarcinoma initially diagnosed in December of 2004  Tobacco/Marijuana/Snuff/ETOH use:   Past/Anticipated interventions by cardiothoracic surgery, if any: left upper lobe wedge resection 01/10/2005  Past/Anticipated interventions by medical oncology, if any:  generic Erlotinib   Signs/Symptoms  Weight changes, if any:   Respiratory complaints, if any:  Hemoptysis, if any:   Pain issues, if any:    SAFETY ISSUES:  Prior radiation?   Pacemaker/ICD?   Possible current pregnancy? N/A  Is the patient on methotrexate? no  Current Complaints / other details:    Vs  wt

## 2020-08-04 ENCOUNTER — Telehealth: Payer: Self-pay | Admitting: Dietician

## 2020-08-04 NOTE — Telephone Encounter (Signed)
Nutrition Assessment   Reason for Assessment: MST    ASSESSMENT:  Initial nutrition assessment completed via phone today.   Patient with metastatic NSCLC diagnosed in 2004 s/p concurrent chemoradiation. He is s/p left upper lobe wedge resection for recurrent disease in left lung in 05/06.  Current therapy: Erlontinib 150 mg daily started 2006. He is followed by Dr. Julien Nordmann.  Past medical history of CAD, AAA, DM, emphysema, HTN, HLD, and hepatic stenosis.  Reports loss of appetite and unintentional wt loss over the past few months. He denies nausea, vomiting, diarrhea.  Eats 1 meal/day about 5 pm and snacks during the day (piece of cheese, cookies) Tried Boost about 15 years ago, says it tore up his stomach, but when mixed with milk he tolerated much better. Other than taking his dog for a couple daily small walks, he is not physically active.  Has an appointment Wednesday 12/29 at 10:30 with radiologist to discuss possible SBRT.   Nutrition Focused Physical Exam: Deferred   Medications: B12, Tarceva, Glimepiride, Jardiance, Prilosec, Tradjenta,    Labs: Glucose 124, Cr 1.86 on 06/18/20   Anthropometrics:   Height: 6'1" Weight: 196 lb 9.6 oz (89.2 kg) UBW: 218 per pt BMI: 25.94  07/07/19 - 215 lb 1.9 oz  12/18/19 - 209 lb 12.8 oz 06/21/20 - 200 lb 4.8 oz 07/13/20- 197 lb 14.4 oz 07/27/20 - 196 lb 9.6 oz  Weights have trended down 4 lbs (2%) in the last month and 18.5 lbs (8.6%) over the last year; insignificant.  Estimated Energy Needs  Kcals: 4580-9983 Protein: 125-135 Fluid: 2.3 - 2.5 L/day   NUTRITION DIAGNOSIS: Unintentional wt loss related to cancer and cancer related treatments as evidenced by 18 lb wt loss (8.6%) in the last year and 4 lbs (2%) in the past month.    INTERVENTION:  -Discussed strategies for adding calories/protein to current eating habits -Encouraged small frequent meals/snacks -Will mail fact sheets including Tips for Increasing Calories  and Protein, Making the Most of Each Bite -Patient agreeable to drinking nutrition supplements  -Complimentary case of Ensure Jeanne Ivan will be available for pick up at registration desk on 08/11/20 -RD contact information provided   MONITORING, EVALUATION, GOAL: weight trends, po intake   Next Visit: Follow-up via telephone in 3 weeks  Lajuan Lines, RD, LDN Clinical Nutrition After Hours/Weekend Pager # in [place]

## 2020-08-11 ENCOUNTER — Ambulatory Visit: Payer: Medicare Other | Admitting: Radiation Oncology

## 2020-08-11 ENCOUNTER — Ambulatory Visit: Payer: Medicare Other

## 2020-08-18 ENCOUNTER — Ambulatory Visit
Admission: RE | Admit: 2020-08-18 | Discharge: 2020-08-18 | Disposition: A | Discharge status: Home / Self Care | Payer: Medicare Other | Source: Ambulatory Visit | Attending: Radiation Oncology | Admitting: Radiation Oncology

## 2020-08-18 ENCOUNTER — Other Ambulatory Visit: Payer: Self-pay

## 2020-08-18 ENCOUNTER — Encounter: Payer: Self-pay | Admitting: Radiation Oncology

## 2020-08-18 VITALS — BP 115/73 | HR 106 | Temp 97.6°F | Resp 18 | Ht 73.0 in | Wt 198.4 lb

## 2020-08-18 DIAGNOSIS — Z87442 Personal history of urinary calculi: Secondary | ICD-10-CM | POA: Diagnosis not present

## 2020-08-18 DIAGNOSIS — I2510 Atherosclerotic heart disease of native coronary artery without angina pectoris: Secondary | ICD-10-CM | POA: Diagnosis not present

## 2020-08-18 DIAGNOSIS — J984 Other disorders of lung: Secondary | ICD-10-CM | POA: Insufficient documentation

## 2020-08-18 DIAGNOSIS — J439 Emphysema, unspecified: Secondary | ICD-10-CM | POA: Diagnosis not present

## 2020-08-18 DIAGNOSIS — Z79899 Other long term (current) drug therapy: Secondary | ICD-10-CM | POA: Diagnosis not present

## 2020-08-18 DIAGNOSIS — R911 Solitary pulmonary nodule: Secondary | ICD-10-CM

## 2020-08-18 DIAGNOSIS — E785 Hyperlipidemia, unspecified: Secondary | ICD-10-CM | POA: Diagnosis not present

## 2020-08-18 DIAGNOSIS — C3412 Malignant neoplasm of upper lobe, left bronchus or lung: Secondary | ICD-10-CM

## 2020-08-18 DIAGNOSIS — Z7982 Long term (current) use of aspirin: Secondary | ICD-10-CM | POA: Insufficient documentation

## 2020-08-18 DIAGNOSIS — C3431 Malignant neoplasm of lower lobe, right bronchus or lung: Secondary | ICD-10-CM | POA: Insufficient documentation

## 2020-08-18 DIAGNOSIS — E119 Type 2 diabetes mellitus without complications: Secondary | ICD-10-CM | POA: Diagnosis not present

## 2020-08-18 DIAGNOSIS — Z87891 Personal history of nicotine dependence: Secondary | ICD-10-CM | POA: Diagnosis not present

## 2020-08-18 DIAGNOSIS — I714 Abdominal aortic aneurysm, without rupture: Secondary | ICD-10-CM | POA: Diagnosis not present

## 2020-08-18 DIAGNOSIS — I10 Essential (primary) hypertension: Secondary | ICD-10-CM | POA: Insufficient documentation

## 2020-08-18 DIAGNOSIS — Z7984 Long term (current) use of oral hypoglycemic drugs: Secondary | ICD-10-CM | POA: Insufficient documentation

## 2020-08-18 DIAGNOSIS — K760 Fatty (change of) liver, not elsewhere classified: Secondary | ICD-10-CM | POA: Insufficient documentation

## 2020-08-18 DIAGNOSIS — Z9221 Personal history of antineoplastic chemotherapy: Secondary | ICD-10-CM | POA: Insufficient documentation

## 2020-08-18 NOTE — Progress Notes (Signed)
Radiation Oncology         ([phone]) [phone] ________________________________  Initial Outpatient Consultation  Name: [name] MRN: [id number]  Date: 08/18/2020  DOB: 1950/12/05  JS:HFWYOV, Liane Comber, DO  Curt Bears, MD   REFERRING PHYSICIAN: Curt Bears, MD  DIAGNOSIS: The primary encounter diagnosis was Malignant neoplasm of upper lobe of left lung ([place]). A diagnosis of Nodule of lower lobe of right lung was also pertinent to this visit.  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 male who is seen as a courtesy of Dr. Julien Nordmann for an opinion concerning radiation therapy as part of management for his recently diagnosed lung cancer. Today, he is accompanied by his wife. The patient has a remote history of metastatic non-small cell lung cancer, adenocarcinoma, that was initially diagnosed in December of 2004 and treated with concurrent chemoradiation under the care of Dr. Truddie Coco. He ended up undergoing a left upper lobe wedge resection on 01/10/2005 for recurrent disease in the left lung. He has been on Erlotinib since 2006.   The patient has since been closely monitored by Dr. Julien Nordmann with serial chest CT scans. Most recent chest CT scan on 06/18/2020 showed a nodule within the superior segment of the right lower lobe that was stable to mildly increased in size in the interval. Findings were suspicious for bronchogenic carcinoma. However, there was no adenopathy or distant metastatic disease within the chest.  PET scan on 07/09/2020 showed a small nodule in the right lower lobe that had a maximum SUV of only 1.3, but had been steadily enlarging and was suspicious for a low-grade adenocarcinoma. Additionally, there was noted to be chronic scarring in the left upper lobe and an infrarenal abdominal aortic aneurysm that was 3.9 cm in diameter. There were no other findings of  active malignancy.  The patient was last seen by Dr. Julien Nordmann on 07/13/2020, who recommended continuing treatment with Erolotinib as well as referral to radiation oncology for consideration of SBRT to the nodule.  PREVIOUS RADIATION THERAPY: Yes details pending at this time.  PAST MEDICAL HISTORY:  Past Medical History:  Diagnosis Date  . AAA (abdominal aortic aneurysm) (H[facility])    a. 3.4 x 3.5 on scan 02/28/13.  . Allergic reaction to contrast dye   . Atherosclerosis   . CAD (coronary artery disease)    a. stenting of RCA/LCx 2004. b. [place] 08/2013:  s/p DES to LCx for severe ISR.  . Diabetes mellitus   . Diverticulosis   . Emphysema lung ([place])   . Emphysema of lung ([place])   . Encounter for therapeutic drug monitoring 06/06/2016  . Hemorrhoids   . Hepatic steatosis   . HTN (hypertension)   . Hyperlipidemia   . Kidney stone   . Lung cancer ([place]) dx'd 2004   chemo/xrt comp; tarceva ongoing    PAST SURGICAL HISTORY: Past Surgical History:  Procedure Laterality Date  . ANGIOPLASTY / STENTING FEMORAL  2015   2004-groin  . LEFT HEART CATHETERIZATION WITH CORONARY ANGIOGRAM N/A 09/08/2013   Procedure: LEFT HEART CATHETERIZATION WITH CORONARY ANGIOGRAM;  Surgeon: Blane Ohara, MD;  [facility] C[facility] LAB;  Service: Cardiovascular;  Laterality: N/A;  . left partial lobectomy    . WISDOM TOOTH EXTRACTION      FAMILY HISTORY:  Family History  Problem Relation Age of Onset  . Heart disease Sister   . Diabetes Sister   .  Ovarian cancer Sister   . Colon cancer Neg Hx   . Esophageal cancer Neg Hx   . Rectal cancer Neg Hx   . Stomach cancer Neg Hx     SOCIAL HISTORY:  Social History   Tobacco Use  . Smoking status: Former Smoker    Quit date: 08/14/2002    Years since quitting: 18.0  . Smokeless tobacco: Never Used  Vaping Use  . Vaping Use: Never used  Substance Use Topics  . Alcohol use: No  . Drug use: No    ALLERGIES:  Allergies  Allergen Reactions  . Iohexol Other  (See Comments)     Code: HIVES, Desc: pt recieves $RemoveBef'50mg'ZAjedaAniB$  benadryl 1 hour prior to scan   . Morphine And Related Anxiety    hallucinations  . Other Rash    chlorascrub when starting IV    MEDICATIONS:  Current Outpatient Medications  Medication Sig Dispense Refill  . ferrous sulfate 324 MG TBEC Take 324 mg by mouth.    Marland Kitchen ACCU-CHEK AVIVA PLUS test strip 1 each daily.    Marland Kitchen amLODipine (NORVASC) 5 MG tablet Take 1 tablet (5 mg total) by mouth daily. 90 tablet 3  . aspirin EC 81 MG tablet Take 81 mg by mouth daily. Swallow whole.    Marland Kitchen atorvastatin (LIPITOR) 40 MG tablet Take 40 mg by mouth daily.    . cetirizine (ZYRTEC) 10 MG chewable tablet Chew 10 mg by mouth daily as needed for allergies.    Marland Kitchen clopidogrel (PLAVIX) 75 MG tablet TAKE 1 TABLET (75 MG TOTAL) BY MOUTH DAILY. 90 tablet 2  . Cyanocobalamin (VITAMIN B 12 PO) Take by mouth. Taking daily    . erlotinib (TARCEVA) 150 MG tablet Take 1 tablet (150 mg total) by mouth daily. Take on an empty stomach 1 hour before meals or 2 hours after. 30 tablet 2  . glimepiride (AMARYL) 4 MG tablet Take 4 mg by mouth 2 (two) times daily.    . isosorbide mononitrate (IMDUR) 60 MG 24 hr tablet Take 1 tablet (60 mg total) by mouth daily. 90 tablet 3  . JARDIANCE 25 MG TABS tablet Take 25 mg by mouth daily.    . Lancets (FREESTYLE) lancets 1 each daily.    Marland Kitchen losartan (COZAAR) 50 MG tablet Take 25 mg by mouth daily.    . metoprolol (LOPRESSOR) 100 MG tablet Take 100 mg by mouth daily. Take one tablet by mouth daily with or following a meal  4  . nitroGLYCERIN (NITROSTAT) 0.4 MG SL tablet Place 1 tablet (0.4 mg total) under the tongue every 5 (five) minutes as needed for chest pain (up to 3 doses). 25 tablet 3  . omeprazole (PRILOSEC) 20 MG capsule Take 20 mg by mouth 2 (two) times daily.  5  . TRADJENTA 5 MG TABS tablet Take 5 mg by mouth daily.    . zaleplon (SONATA) 10 MG capsule Take 10 mg by mouth at bedtime.  5   No current facility-administered  medications for this encounter.    REVIEW OF SYSTEMS:  A 10+ POINT REVIEW OF SYSTEMS WAS OBTAINED including neurology, dermatology, psychiatry, cardiac, respiratory, lymph, extremities, GI, GU, musculoskeletal, constitutional, reproductive, HEENT.  He does complain of generalized fatigue.  No respiratory issues   PHYSICAL EXAM:  height is $RemoveB'6\' 1"'GLNrSBaz$  (1.854 m) and weight is 198 lb 6.4 oz (90 kg). His temperature is 97.6 F (36.4 C). His blood pressure is 115/73 and his pulse is 106 (abnormal). His respiration is  18 and oxygen saturation is 97%.   General: Alert and oriented, in no acute distress HEENT: Head is normocephalic. Extraocular movements are intact.  Neck: Neck is supple, no palpable cervical or supraclavicular lymphadenopathy. Heart: Regular in rate and rhythm with no murmurs, rubs, or gallops. Chest: Clear to auscultation bilaterally, with no rhonchi, wheezes, or rales.  Patient has tattoos in his chest from his previous treatment.  In the right mid back region on the r patient has hyperpigmentation changes and radiation changes from his prior radiation therapy Abdomen: Soft, nontender, nondistended, with no rigidity or guarding. Extremities: No cyanosis or edema. Lymphatics: see Neck Exam Skin: No concerning lesions. Musculoskeletal: symmetric strength and muscle tone throughout. Neurologic: Cranial nerves [name] through XII are grossly intact. No obvious focalities. Speech is fluent. Coordination is intact. Psychiatric: Judgment and insight are intact. Affect is appropriate.   ECOG = 1  0 - Asymptomatic (Fully active, able to carry on all predisease activities without restriction)  1 - Symptomatic but completely ambulatory (Restricted in physically strenuous activity but ambulatory and able to carry out work of a light or sedentary nature. For example, light housework, office work)  2 - Symptomatic, <50% in bed during the day (Ambulatory and capable of all self care but unable to carry  out any work activities. Up and about more than 50% of waking hours)  3 - Symptomatic, >50% in bed, but not bedbound (Capable of only limited self-care, confined to bed or chair 50% or more of waking hours)  4 - Bedbound (Completely disabled. Cannot carry on any self-care. Totally confined to bed or chair)  5 - Death   Santiago Glad MM, Creech RH, Tormey DC, et al. [phone]). "Toxicity and response criteria of the [facility]". Am. Evlyn Clines. Oncol. 5 (6): 649-55  LABORATORY DATA:  Lab Results  Component Value Date   WBC 10.0 06/18/2020   HGB 14.7 06/18/2020   HCT 43.6 06/18/2020   MCV 86.7 06/18/2020   PLT 237 06/18/2020   NEUTROABS 6.3 06/18/2020   Lab Results  Component Value Date   NA 138 06/18/2020   K 4.7 06/18/2020   CL 104 06/18/2020   CO2 26 06/18/2020   GLUCOSE 124 (H) 06/18/2020   CREATININE 1.86 (H) 06/18/2020   CALCIUM 9.5 06/18/2020      RADIOGRAPHY: No results found.    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  Recent serial imaging shows progression of a small nodule in the right lower lobe with imaging characteristics very suspicious for bronchogenic carcinoma.  Patient does have a prior history of radiation therapy but given the long interval from his treatment and the location of his current lesion, SBRT of this lesion would be a good option for the patient.  The other option for him would to continue on immunotherapy as above.  Today, I talked to the patient and wife about the findings and work-up thus far.  We discussed the natural history of lung cancer and general treatment, highlighting the role of radiotherapy in the management.  We discussed the available radiation techniques, and focused on the details of logistics and delivery.  We reviewed the anticipated acute and late sequelae associated with radiation in this setting.  The patient was encouraged to ask questions that I  answered to the best of my ability. We discussed in detail discussed differences from the current planned radiation therapy and his previous radiation treatment A patient consent form was  discussed and signed.  We retained a copy for our records.  The patient would like to proceed with radiation and will be scheduled for CT simulation.  After careful evaluation the patient would like to proceed with SBRT for this solitary small lesion in the right lower lung.  Anticipate 3 treatments.  PLAN: Patient will be scheduled for CT simulation in the next several days.  Anticipate radiation starting in mid to late January with completion prior to February  Total time spent in this encounter was 60 minutes which included reviewing the patient's most recent chest CT scan, PET scan, follow-ups, physical examination, and documentation.   ------------------------------------------------  Blair Promise, PhD, MD  This document serves as a record of services personally performed by Gery Pray, MD. It was created on his behalf by Clerance Lav, a trained medical scribe. The creation of this record is based on the scribe's personal observations and the provider's statements to them. This document has been checked and approved by the attending provider.

## 2020-08-18 NOTE — Progress Notes (Signed)
Patient here for a consult today with Dr. Sondra Come.  Thoracic Location of Tumor / Histology: Metastatic non-small cell lung cancer, adenocarcinoma Left lung dx 07/2003. Now patient has a RLL lung nodule and patient reports there is a second nodule that was seen.  Patient presented at f/u visit with a recent CT scan showing low hypermetabolic activity in the right lower lobe lung nodule but because of the slowly enlarging size over the last year, still suspicious for bronchogenic carcinoma   Tobacco/Marijuana/Snuff/ETOH use: smoked 1 1/2 PPD for 30 years. Quit 2004  Past/Anticipated interventions by cardiothoracic surgery, if any: status post left upper lobe wedge resection on 01/10/2005 for recurrent disease in the left lung.  Past/Anticipated interventions by medical oncology, if any: Erlotinib 150 mg by mouth daily started in 2006.  Signs/Symptoms  Weight changes, if any: has lost about 20-35 lbs in 4 months Respiratory complaints, if any: occ  cough Hemoptysis, if any: no Pain issues, if any:  no  SAFETY ISSUES: Prior radiation? 2004  Pacemaker/ICD? no  Possible current pregnancy? N/A  Is the patient on methotrexate? no  Current Complaints / other details: Patient's wife is here with him.  BP 115/73 (BP Location: Right Arm, Patient Position: Sitting, Cuff Size: Normal)   Pulse (!) 106   Temp 97.6 F (36.4 C)   Resp 18   Ht 6\' 1"  (1.854 m)   Wt 198 lb 6.4 oz (90 kg)   SpO2 97%   BMI 26.18 kg/m    Wt Readings from Last 3 Encounters:  08/18/20 198 lb 6.4 oz (90 kg)  07/27/20 196 lb 9.6 oz (89.2 kg)  07/14/20 199 lb (90.3 kg)

## 2020-08-19 ENCOUNTER — Encounter: Payer: Self-pay | Admitting: General Practice

## 2020-08-19 NOTE — Progress Notes (Signed)
[place] Psychosocial Distress Screening Clinical Social Work  Clinical Social Work was referred by distress screening protocol.  The patient scored a 5 on the Psychosocial Distress Thermometer which indicates moderate distress. Clinical Social Worker contacted patient by phone to assess for distress and other psychosocial needs.  Briefly discussed [place] options and encouraged him to call as needed.  He states he is doing fine, just waiting for further appointments.    ONCBCN DISTRESS SCREENING 08/18/2020  Screening Type Initial Screening  Distress experienced in past week (1-10) 5  Referral to clinical social work No    Clinical Social Worker follow up needed: No.  If yes, follow up plan:  Beverely Pace, [place], LCSW Clinical Social Worker Phone:  [phone]

## 2020-08-30 ENCOUNTER — Ambulatory Visit: Payer: Medicare Other | Admitting: Radiation Oncology

## 2020-09-07 ENCOUNTER — Other Ambulatory Visit: Payer: Self-pay

## 2020-09-07 ENCOUNTER — Ambulatory Visit
Admission: RE | Admit: 2020-09-07 | Discharge: 2020-09-07 | Disposition: A | Discharge status: Home / Self Care | Payer: Medicare Other | Source: Ambulatory Visit | Attending: Radiation Oncology | Admitting: Radiation Oncology

## 2020-09-07 ENCOUNTER — Ambulatory Visit: Payer: Medicare Other | Admitting: Radiation Oncology

## 2020-09-07 ENCOUNTER — Other Ambulatory Visit: Payer: Self-pay | Admitting: Medical Oncology

## 2020-09-07 DIAGNOSIS — R911 Solitary pulmonary nodule: Secondary | ICD-10-CM

## 2020-09-07 DIAGNOSIS — C3412 Malignant neoplasm of upper lobe, left bronchus or lung: Secondary | ICD-10-CM | POA: Diagnosis not present

## 2020-09-07 DIAGNOSIS — C3490 Malignant neoplasm of unspecified part of unspecified bronchus or lung: Secondary | ICD-10-CM

## 2020-09-07 MED ORDER — ERLOTINIB HCL 150 MG PO TABS: 150.0000 mg | ORAL_TABLET | Freq: Every day | ORAL | 2 refills | Status: DC

## 2020-09-08 ENCOUNTER — Ambulatory Visit: Payer: Medicare Other | Admitting: Radiation Oncology

## 2020-09-08 ENCOUNTER — Telehealth: Payer: Self-pay

## 2020-09-08 DIAGNOSIS — C3412 Malignant neoplasm of upper lobe, left bronchus or lung: Secondary | ICD-10-CM | POA: Diagnosis not present

## 2020-09-08 NOTE — Telephone Encounter (Signed)
Nutrition  Called patient for nutrition follow-up.  No answer. Left message with call back number  Lucita Montoya B. Jhoana Upham, RD, LDN Registered Dietitian [phone] (mobile)  

## 2020-09-08 NOTE — Telephone Encounter (Signed)
Nutrition Follow-up:  Patient with metastatic NSCLC.  Patient taking Erlontinib and to receive SBRT x 3 treaments.  Spoke with patient via phone.  Patient reports that he is eating what he can and doing alright.  Reports that some foods he avoids like spicy foods, red meat (uncomfortable going down), dry chicken.  Able to eat cheese, cottage cheese.  Likes unsweet green tea.  Does not eat many eggs.      Medications: reviewed  Labs: reviewed  Anthropometrics:   Weight 198 lb increased from 196 lb   NUTRITION DIAGNOSIS: Unintentional weight loss improved    INTERVENTION:  Discussed chopping foods, adding moisture to foods (gravies, sauces, etc) for ease of swallowing.  Encouraged good sources of protein.  Patient has RD contact information and will contact if needed in the future    NEXT VISIT: no follow-up planned, available as needed  Vanisha Whiten B. Zenia Resides, [place], [place] Registered Dietitian [phone] (mobile)

## 2020-09-09 ENCOUNTER — Ambulatory Visit: Payer: Medicare Other | Admitting: Radiation Oncology

## 2020-09-14 ENCOUNTER — Ambulatory Visit: Payer: Medicare Other | Admitting: Radiation Oncology

## 2020-09-15 ENCOUNTER — Ambulatory Visit
Admission: RE | Admit: 2020-09-15 | Discharge: 2020-09-15 | Disposition: A | Discharge status: Home / Self Care | Payer: Medicare Other | Source: Ambulatory Visit | Attending: Radiation Oncology | Admitting: Radiation Oncology

## 2020-09-15 ENCOUNTER — Other Ambulatory Visit: Payer: Self-pay

## 2020-09-15 DIAGNOSIS — C3412 Malignant neoplasm of upper lobe, left bronchus or lung: Secondary | ICD-10-CM | POA: Insufficient documentation

## 2020-09-15 DIAGNOSIS — C3490 Malignant neoplasm of unspecified part of unspecified bronchus or lung: Secondary | ICD-10-CM | POA: Insufficient documentation

## 2020-09-17 ENCOUNTER — Ambulatory Visit
Admission: RE | Admit: 2020-09-17 | Discharge: 2020-09-17 | Disposition: A | Discharge status: Home / Self Care | Payer: Medicare Other | Source: Ambulatory Visit | Attending: Radiation Oncology | Admitting: Radiation Oncology

## 2020-09-17 ENCOUNTER — Other Ambulatory Visit: Payer: Self-pay

## 2020-09-17 DIAGNOSIS — C3490 Malignant neoplasm of unspecified part of unspecified bronchus or lung: Secondary | ICD-10-CM | POA: Diagnosis not present

## 2020-09-22 ENCOUNTER — Other Ambulatory Visit: Payer: Self-pay

## 2020-09-22 ENCOUNTER — Ambulatory Visit
Admission: RE | Admit: 2020-09-22 | Discharge: 2020-09-22 | Disposition: A | Discharge status: Home / Self Care | Payer: Medicare Other | Source: Ambulatory Visit | Attending: Radiation Oncology | Admitting: Radiation Oncology

## 2020-09-22 ENCOUNTER — Encounter: Payer: Self-pay | Admitting: Radiation Oncology

## 2020-09-22 DIAGNOSIS — C3490 Malignant neoplasm of unspecified part of unspecified bronchus or lung: Secondary | ICD-10-CM | POA: Diagnosis not present

## 2020-09-22 DIAGNOSIS — C3412 Malignant neoplasm of upper lobe, left bronchus or lung: Secondary | ICD-10-CM

## 2020-09-23 ENCOUNTER — Ambulatory Visit: Payer: Medicare Other | Admitting: Radiation Oncology

## 2020-09-24 ENCOUNTER — Ambulatory Visit: Payer: Medicare Other | Admitting: Radiation Oncology

## 2020-09-25 ENCOUNTER — Observation Stay (HOSPITAL_COMMUNITY)
Admission: EM | Admit: 2020-09-25 | Discharge: 2020-09-26 | Disposition: A | Discharge status: Home / Self Care | Payer: Medicare Other | Attending: Internal Medicine | Admitting: Internal Medicine

## 2020-09-25 ENCOUNTER — Encounter (HOSPITAL_COMMUNITY): Payer: Self-pay

## 2020-09-25 ENCOUNTER — Other Ambulatory Visit: Payer: Self-pay

## 2020-09-25 ENCOUNTER — Emergency Department (HOSPITAL_COMMUNITY): Payer: Medicare Other

## 2020-09-25 DIAGNOSIS — Z85118 Personal history of other malignant neoplasm of bronchus and lung: Secondary | ICD-10-CM | POA: Diagnosis not present

## 2020-09-25 DIAGNOSIS — C3491 Malignant neoplasm of unspecified part of right bronchus or lung: Secondary | ICD-10-CM | POA: Diagnosis not present

## 2020-09-25 DIAGNOSIS — I130 Hypertensive heart and chronic kidney disease with heart failure and stage 1 through stage 4 chronic kidney disease, or unspecified chronic kidney disease: Secondary | ICD-10-CM | POA: Insufficient documentation

## 2020-09-25 DIAGNOSIS — I2510 Atherosclerotic heart disease of native coronary artery without angina pectoris: Secondary | ICD-10-CM | POA: Diagnosis not present

## 2020-09-25 DIAGNOSIS — K5900 Constipation, unspecified: Secondary | ICD-10-CM | POA: Diagnosis present

## 2020-09-25 DIAGNOSIS — E872 Acidosis, unspecified: Secondary | ICD-10-CM | POA: Diagnosis present

## 2020-09-25 DIAGNOSIS — E118 Type 2 diabetes mellitus with unspecified complications: Secondary | ICD-10-CM

## 2020-09-25 DIAGNOSIS — N1832 Chronic kidney disease, stage 3b: Secondary | ICD-10-CM | POA: Diagnosis not present

## 2020-09-25 DIAGNOSIS — Z87891 Personal history of nicotine dependence: Secondary | ICD-10-CM | POA: Diagnosis not present

## 2020-09-25 DIAGNOSIS — R34 Anuria and oliguria: Secondary | ICD-10-CM | POA: Insufficient documentation

## 2020-09-25 DIAGNOSIS — Z79899 Other long term (current) drug therapy: Secondary | ICD-10-CM | POA: Insufficient documentation

## 2020-09-25 DIAGNOSIS — R6510 Systemic inflammatory response syndrome (SIRS) of non-infectious origin without acute organ dysfunction: Secondary | ICD-10-CM | POA: Diagnosis present

## 2020-09-25 DIAGNOSIS — K625 Hemorrhage of anus and rectum: Secondary | ICD-10-CM | POA: Diagnosis not present

## 2020-09-25 DIAGNOSIS — Z7982 Long term (current) use of aspirin: Secondary | ICD-10-CM | POA: Diagnosis not present

## 2020-09-25 DIAGNOSIS — Z7984 Long term (current) use of oral hypoglycemic drugs: Secondary | ICD-10-CM | POA: Insufficient documentation

## 2020-09-25 DIAGNOSIS — K5641 Fecal impaction: Secondary | ICD-10-CM | POA: Diagnosis not present

## 2020-09-25 DIAGNOSIS — E876 Hypokalemia: Secondary | ICD-10-CM | POA: Diagnosis present

## 2020-09-25 DIAGNOSIS — Z7902 Long term (current) use of antithrombotics/antiplatelets: Secondary | ICD-10-CM | POA: Insufficient documentation

## 2020-09-25 DIAGNOSIS — I5032 Chronic diastolic (congestive) heart failure: Secondary | ICD-10-CM | POA: Diagnosis present

## 2020-09-25 DIAGNOSIS — Z20822 Contact with and (suspected) exposure to covid-19: Secondary | ICD-10-CM | POA: Diagnosis not present

## 2020-09-25 DIAGNOSIS — I10 Essential (primary) hypertension: Secondary | ICD-10-CM

## 2020-09-25 LAB — LACTIC ACID, PLASMA
Lactic Acid, Venous: 4.1 mmol/L (ref 0.5–1.9)
Lactic Acid, Venous: 2.3 mmol/L (ref 0.5–1.9)

## 2020-09-25 LAB — CBC WITH DIFFERENTIAL/PLATELET
Abs Immature Granulocytes: 0.08 10*3/uL — ABNORMAL HIGH (ref 0.00–0.07)
Basophils Absolute: 0.1 10*3/uL (ref 0.0–0.1)
Basophils Relative: 0 %
Eosinophils Absolute: 0 10*3/uL (ref 0.0–0.5)
Eosinophils Relative: 0 %
HCT: 50 % (ref 39.0–52.0)
Hemoglobin: 17.1 g/dL — ABNORMAL HIGH (ref 13.0–17.0)
Immature Granulocytes: 0 %
Lymphocytes Relative: 11 %
Lymphs Abs: 2 10*3/uL (ref 0.7–4.0)
MCH: 30.8 pg (ref 26.0–34.0)
MCHC: 34.2 g/dL (ref 30.0–36.0)
MCV: 89.9 fL (ref 80.0–100.0)
Monocytes Absolute: 1.3 10*3/uL — ABNORMAL HIGH (ref 0.1–1.0)
Monocytes Relative: 7 %
Neutro Abs: 14.5 10*3/uL — ABNORMAL HIGH (ref 1.7–7.7)
Neutrophils Relative %: 82 %
Platelets: 241 10*3/uL (ref 150–400)
RBC: 5.56 MIL/uL (ref 4.22–5.81)
RDW: 16.1 % — ABNORMAL HIGH (ref 11.5–15.5)
WBC: 17.8 10*3/uL — ABNORMAL HIGH (ref 4.0–10.5)
nRBC: 0 % (ref 0.0–0.2)

## 2020-09-25 LAB — URINALYSIS, ROUTINE W REFLEX MICROSCOPIC
Bacteria, UA: NONE SEEN
Bilirubin Urine: NEGATIVE
Glucose, UA: >=500 mg/dL — AB
Ketones, ur: 20 mg/dL — AB
Leukocytes,Ua: NEGATIVE
Nitrite: NEGATIVE
Protein, ur: NEGATIVE mg/dL
Specific Gravity, Urine: 1.021 (ref 1.005–1.030)
pH: 5 (ref 5.0–8.0)

## 2020-09-25 LAB — COMPREHENSIVE METABOLIC PANEL
ALT: 26 U/L (ref 0–44)
AST: 29 U/L (ref 15–41)
Albumin: 4.2 g/dL (ref 3.5–5.0)
Alkaline Phosphatase: 72 U/L (ref 38–126)
Anion gap: 15 (ref 5–15)
BUN: 24 mg/dL — ABNORMAL HIGH (ref 8–23)
CO2: 19 mmol/L — ABNORMAL LOW (ref 22–32)
Calcium: 9.5 mg/dL (ref 8.9–10.3)
Chloride: 104 mmol/L (ref 98–111)
Creatinine, Ser: 1.95 mg/dL — ABNORMAL HIGH (ref 0.61–1.24)
GFR, Estimated: 36 mL/min — ABNORMAL LOW (ref >60)
Glucose, Bld: 133 mg/dL — ABNORMAL HIGH (ref 70–99)
Potassium: 3.4 mmol/L — ABNORMAL LOW (ref 3.5–5.1)
Sodium: 138 mmol/L (ref 135–145)
Total Bilirubin: 1.4 mg/dL — ABNORMAL HIGH (ref 0.3–1.2)
Total Protein: 7.7 g/dL (ref 6.5–8.1)

## 2020-09-25 LAB — RESP PANEL BY RT-PCR (FLU A&B, COVID) ARPGX2
Influenza A by PCR: NEGATIVE
Influenza B by PCR: NEGATIVE
SARS Coronavirus 2 by RT PCR: NEGATIVE

## 2020-09-25 LAB — PROTIME-INR
INR: 1.1 (ref 0.8–1.2)
Prothrombin Time: 13.8 seconds (ref 11.4–15.2)

## 2020-09-25 LAB — LIPASE, BLOOD: Lipase: 36 U/L (ref 11–51)

## 2020-09-25 LAB — MAGNESIUM: Magnesium: 1.6 mg/dL — ABNORMAL LOW (ref 1.7–2.4)

## 2020-09-25 LAB — PHOSPHORUS: Phosphorus: 2.2 mg/dL — ABNORMAL LOW (ref 2.5–4.6)

## 2020-09-25 MED ORDER — SODIUM CHLORIDE 0.9 % IV SOLN
2.0000 g | Freq: Once | INTRAVENOUS | Status: AC
  Administered 2020-09-25: 2 g via INTRAVENOUS
  Filled 2020-09-25: qty 20

## 2020-09-25 MED ORDER — FLEET ENEMA 7-19 GM/118ML RE ENEM: 1.0000 | ENEMA | Freq: Once | RECTAL | Status: DC

## 2020-09-25 MED ORDER — LACTATED RINGERS IV BOLUS (SEPSIS)
1000.0000 mL | Freq: Once | INTRAVENOUS | Status: DC
  Administered 2020-09-26: 1000 mL via INTRAVENOUS

## 2020-09-25 MED ORDER — WITCH HAZEL-GLYCERIN EX PADS
MEDICATED_PAD | CUTANEOUS | Status: DC | PRN
  Filled 2020-09-25: qty 100

## 2020-09-25 MED ORDER — LACTATED RINGERS IV BOLUS
1000.0000 mL | Freq: Once | INTRAVENOUS | Status: AC
  Administered 2020-09-25: 1000 mL via INTRAVENOUS

## 2020-09-25 MED ORDER — METRONIDAZOLE IN NACL 5-0.79 MG/ML-% IV SOLN
500.0000 mg | Freq: Once | INTRAVENOUS | Status: AC
  Administered 2020-09-25: 500 mg via INTRAVENOUS
  Filled 2020-09-25: qty 100

## 2020-09-25 MED ORDER — GLYCERIN (LAXATIVE) 2.1 G RE SUPP
1.0000 | Freq: Once | RECTAL | Status: AC
  Administered 2020-09-25: 1 via RECTAL
  Filled 2020-09-25: qty 1

## 2020-09-25 MED ORDER — LACTATED RINGERS IV BOLUS (SEPSIS)
1000.0000 mL | Freq: Once | INTRAVENOUS | Status: AC
  Administered 2020-09-25: 1000 mL via INTRAVENOUS

## 2020-09-25 MED ORDER — HYDROMORPHONE HCL 1 MG/ML IJ SOLN
0.5000 mg | Freq: Once | INTRAMUSCULAR | Status: AC
  Administered 2020-09-25: 0.5 mg via INTRAVENOUS
  Filled 2020-09-25: qty 1

## 2020-09-25 MED ORDER — NICOTINE 14 MG/24HR TD PT24
14.0000 mg | MEDICATED_PATCH | Freq: Once | TRANSDERMAL | Status: DC
  Filled 2020-09-25: qty 1

## 2020-09-25 MED ORDER — LACTATED RINGERS IV SOLN: INTRAVENOUS | Status: DC

## 2020-09-25 NOTE — ED Notes (Signed)
Pt c/o some rectal bleeding that occurred while trying to have bm in ED. Unsure if he wants suppository d/t bleeding, asked to speak w provider. Provider made aware

## 2020-09-25 NOTE — ED Notes (Signed)
Date and time results received: 09/25/20 1815   Test: lactic acid Critical Value: 4.1  Name of Provider Notified: Pfeiffer  Orders Received? Or Actions Taken?:

## 2020-09-25 NOTE — Progress Notes (Signed)
Pt being followed by [facility] for sepsis protocol. 

## 2020-09-25 NOTE — ED Notes (Signed)
Provider at bedside

## 2020-09-25 NOTE — ED Triage Notes (Signed)
Pt c/o  Urinary retentiont x 1 day- feels the urge, but unable to urinate and mild constipation x 2 days. Currently receiving radiation for lung ca- last tx on wed.. Pt is tachy- Given 31ml NS by EMS  HR-115 BP-124/60 bgl- 192 98% ra

## 2020-09-25 NOTE — ED Notes (Signed)
Pt clarified that he urinated 1 hr ago- but had to strain. Pt also clarified he took a laxative yesterday without any relief, feels the urge to go, some liquid came out, but not a full bowel movement

## 2020-09-25 NOTE — ED Notes (Signed)
Date and time results received: 09/25/20 2142 (use smartphrase ".now" to insert current time)  Test: Lactic Acid Critical Value: 2.3  Name of Provider Notified: Johnney Killian, MD  Orders Received? Or Actions Taken?: Orders Received - See Orders for details

## 2020-09-25 NOTE — ED Provider Notes (Signed)
[facility] DEPT Provider Note   CSN: [id number] Arrival date & time: 09/25/20  1504     History Chief Complaint  Patient presents with  . Urinary Retention    [name] is a 70 y.o. male.  HPI The patient has a remote history of metastatic non-small cell lung cancer, adenocarcinoma, that was initially diagnosed in December of 2004 and treated with concurrent chemoradiation. He ended up undergoing a left upper lobe wedge resection on 01/10/2005 for recurrent disease in the left lung. He has been on Erlotinib since 2006.The patient has since been closely monitored by Dr. Julien Nordmann with serial chest CT scans. Most recent chest CT scan on 06/18/2020 showed a nodule within the superior segment of the right lower lobe that was stable to mildly increased in size in the interval. Findings were suspicious for bronchogenic carcinoma. However, there was no adenopathy or distant metastatic disease within the chest. Patient was restarted on radiation therapy. He had radiation treatment Monday Wednesday last week and Wednesday this week. Patient reports he had been doing well up until day before yesterday. He then started developing lower abdominal discomfort and urgency for bowel movement. He reports that he started passing small amounts of stool with some blood. He reports he was having some episodes of incontinence with this. Then last night at about 9 PM, he stopped putting out urine. He reports he can put out urine if he forces himself with lots of intra-abdominal pressure but then also will have incontinence of stool. Has been having a generalized lower abdominal discomfort. Reports most discomfort both laying is the urgency to have a bowel movement. No fevers, no chills. He has not eaten since yesterday. No vomiting. Patient has not had any recent antibiotic therapy. He is up-to-date on Covid vaccine plus booster.    Past Medical History:  Diagnosis Date  . AAA  (abdominal aortic aneurysm) (HCC)    a. 3.4 x 3.5 on scan 02/28/13.  . Allergic reaction to contrast dye   . Atherosclerosis   . CAD (coronary artery disease)    a. stenting of RCA/LCx 2004. b. [place] 08/2013:  s/p DES to LCx for severe ISR.  . Diabetes mellitus   . Diverticulosis   . Emphysema lung ([place])   . Emphysema of lung ([place])   . Encounter for therapeutic drug monitoring 06/06/2016  . Hemorrhoids   . Hepatic steatosis   . HTN (hypertension)   . Hyperlipidemia   . Kidney stone   . Lung cancer ([place]) dx'd 2004   chemo/xrt comp; tarceva ongoing    Patient Active Problem List   Diagnosis Date Noted  . Nodule of lower lobe of right lung 08/18/2020  . Encounter for therapeutic drug monitoring 06/06/2016  . Malignant neoplasm of upper lobe of left lung ([place]) 11/30/2015  . Leukocytosis 09/17/2013  . CAD (coronary artery disease)   . HTN (hypertension)   . Lung cancer ([place])   . Other and unspecified angina pectoris 09/08/2013  . Dyslipidemia 08/19/2009  . Hypertension 08/19/2009  . LUNG CANCER, HX OF 08/19/2009  . ABDOMINAL AORTIC ANEURYSM, HX OF 08/19/2009    Past Surgical History:  Procedure Laterality Date  . ANGIOPLASTY / STENTING FEMORAL  2015   2004-groin  . LEFT HEART CATHETERIZATION WITH CORONARY ANGIOGRAM N/A 09/08/2013   Procedure: LEFT HEART CATHETERIZATION WITH CORONARY ANGIOGRAM;  Surgeon: Blane Ohara, MD;  Location: [facility] CATH LAB;  Service: Cardiovascular;  Laterality: N/A;  . left partial lobectomy    .  WISDOM TOOTH EXTRACTION         Family History  Problem Relation Age of Onset  . Heart disease Sister   . Diabetes Sister   . Ovarian cancer Sister   . Colon cancer Neg Hx   . Esophageal cancer Neg Hx   . Rectal cancer Neg Hx   . Stomach cancer Neg Hx     Social History   Tobacco Use  . Smoking status: Former Smoker    Quit date: 08/14/2002    Years since quitting: 18.1  . Smokeless tobacco: Never Used  Vaping Use  . Vaping Use: Never used   Substance Use Topics  . Alcohol use: No  . Drug use: No    Home Medications Prior to Admission medications   Medication Sig Start Date End Date Taking? Authorizing Provider  ACCU-CHEK AVIVA PLUS test strip 1 each daily. 06/08/20   [provider]  amLODipine (NORVASC) 5 MG tablet Take 1 tablet (5 mg total) by mouth daily. 07/07/19 07/01/20  Sherren Mocha, MD  aspirin EC 81 MG tablet Take 81 mg by mouth daily. Swallow whole.    [provider]  atorvastatin (LIPITOR) 40 MG tablet Take 40 mg by mouth daily.    [provider]  cetirizine (ZYRTEC) 10 MG chewable tablet Chew 10 mg by mouth daily as needed for allergies.    [provider]  clopidogrel (PLAVIX) 75 MG tablet TAKE 1 TABLET (75 MG TOTAL) BY MOUTH DAILY. 08/10/14   Sherren Mocha, MD  Cyanocobalamin (VITAMIN B 12 PO) Take by mouth. Taking daily    [provider]  erlotinib (TARCEVA) 150 MG tablet Take 1 tablet (150 mg total) by mouth daily. Take on an empty stomach 1 hour before meals or 2 hours after. 09/07/20   Curt Bears, MD  ferrous sulfate 324 MG TBEC Take 324 mg by mouth.    [provider]  glimepiride (AMARYL) 4 MG tablet Take 4 mg by mouth 2 (two) times daily.    [provider]  isosorbide mononitrate (IMDUR) 60 MG 24 hr tablet Take 1 tablet (60 mg total) by mouth daily. 06/28/20 09/26/20  Burtis Junes, NP  JARDIANCE 25 MG TABS tablet Take 25 mg by mouth daily. 06/01/20   [provider]  Lancets (FREESTYLE) lancets 1 each daily. 06/08/20   [provider]  losartan (COZAAR) 50 MG tablet Take 25 mg by mouth daily. 11/20/19   [provider]  metoprolol (LOPRESSOR) 100 MG tablet Take 100 mg by mouth daily. Take one tablet by mouth daily with or following a meal 08/07/14   [provider]  nitroGLYCERIN (NITROSTAT) 0.4 MG SL tablet Place 1 tablet (0.4 mg total) under the tongue every 5 (five) minutes as needed for  chest pain (up to 3 doses). 09/09/13   Dunn, Nedra Hai, PA-C  omeprazole (PRILOSEC) 20 MG capsule Take 20 mg by mouth 2 (two) times daily. 09/24/16   [provider]  TRADJENTA 5 MG TABS tablet Take 5 mg by mouth daily. 06/01/20   [provider]  zaleplon (SONATA) 10 MG capsule Take 10 mg by mouth at bedtime. 05/04/18   [provider]    Allergies    Iohexol, Morphine and related, and Other  Review of Systems   Review of Systems 10 systems reviewed and negative except as per HPI Physical Exam Updated Vital Signs BP (!) 156/71   Pulse 97   Resp 12   Ht 6\' 1"  (1.854  m)   Wt 90.7 kg   SpO2 98%   BMI 26.39 kg/m   Physical Exam Constitutional:      Appearance: He is well-developed and well-nourished.     Comments: Patient is alert and nontoxic. Mental status clear. No respiratory distress.  HENT:     Head: Normocephalic and atraumatic.     Mouth/Throat:     Pharynx: Oropharynx is clear.  Eyes:     Extraocular Movements: EOM normal.     Pupils: Pupils are equal, round, and reactive to light.  Cardiovascular:     Rate and Rhythm: Regular rhythm. Tachycardia present.     Pulses: Intact distal pulses.     Heart sounds: Normal heart sounds.  Pulmonary:     Effort: Pulmonary effort is normal.     Breath sounds: Normal breath sounds.  Abdominal:     General: Bowel sounds are normal. There is no distension.     Palpations: Abdomen is soft.     Tenderness: There is abdominal tenderness.     Comments: Mild diffuse lower abdominal tenderness without any guarding. No appreciable mass. Bladder scan was done while I was present in the room, volume is 94 mils. Bladder does not feel distended or significantly tender to palpation.  Genitourinary:    Comments: Gluteal cleft has some diffuse erythema well confined to the opposing tissues.  Perianal rectal area is boggy.  Large volume of impacted greenish-brown stool in the rectal vault.  Also bright red blood on outside  of stool. Musculoskeletal:        General: No edema. Normal range of motion.     Cervical back: Neck supple.     Right lower leg: No edema.     Left lower leg: No edema.  Skin:    General: Skin is warm, dry and intact.  Neurological:     General: No focal deficit present.     Mental Status: He is alert and oriented to person, place, and time.     GCS: GCS eye subscore is 4. GCS verbal subscore is 5. GCS motor subscore is 6.     Coordination: Coordination normal.     Deep Tendon Reflexes: Strength normal.  Psychiatric:        Mood and Affect: Mood and affect and mood normal.     ED Results / Procedures / Treatments   Labs (all labs ordered are listed, but only abnormal results are displayed) Labs Reviewed  URINALYSIS, ROUTINE W REFLEX MICROSCOPIC - Abnormal; Notable for the following components:      Result Value   Glucose, UA >=500 (*)    Hgb urine dipstick SMALL (*)    Ketones, ur 20 (*)    All other components within normal limits  COMPREHENSIVE METABOLIC PANEL - Abnormal; Notable for the following components:   Potassium 3.4 (*)    CO2 19 (*)    Glucose, Bld 133 (*)    BUN 24 (*)    Creatinine, Ser 1.95 (*)    Total Bilirubin 1.4 (*)    GFR, Estimated 36 (*)    All other components within normal limits  LACTIC ACID, PLASMA - Abnormal; Notable for the following components:   Lactic Acid, Venous 4.1 (*)    All other components within normal limits  LACTIC ACID, PLASMA - Abnormal; Notable for the following components:   Lactic Acid, Venous 2.3 (*)    All other components within normal limits  CBC WITH DIFFERENTIAL/PLATELET - Abnormal; Notable for the following  components:   WBC 17.8 (*)    Hemoglobin 17.1 (*)    RDW 16.1 (*)    Neutro Abs 14.5 (*)    Monocytes Absolute 1.3 (*)    Abs Immature Granulocytes 0.08 (*)    All other components within normal limits  MAGNESIUM - Abnormal; Notable for the following components:   Magnesium 1.6 (*)    All other components  within normal limits  PHOSPHORUS - Abnormal; Notable for the following components:   Phosphorus 2.2 (*)    All other components within normal limits  RESP PANEL BY RT-PCR (FLU A&B, COVID) ARPGX2  CULTURE, BLOOD (SINGLE)  LIPASE, BLOOD  PROTIME-INR    EKG None  Radiology CT Abdomen Pelvis Wo Contrast  Result Date: 09/25/2020 CLINICAL DATA:  Urinary retention.  Constipation. EXAM: CT ABDOMEN AND PELVIS WITHOUT CONTRAST TECHNIQUE: Multidetector CT imaging of the abdomen and pelvis was performed following the standard protocol without IV contrast. COMPARISON:  CT dated 12/06/2010 FINDINGS: Lower chest: The lung bases are clear. The heart size is normal. Hepatobiliary: There is decreased hepatic attenuation suggestive of hepatic steatosis. Normal gallbladder.There is no biliary ductal dilation. Pancreas: Normal contours without ductal dilatation. No peripancreatic fluid collection. Spleen: Unremarkable. Adrenals/Urinary Tract: --Adrenal glands: Unremarkable. --Right kidney/ureter: There multiple punctate nonobstructing stones in the right kidney. No right sided hydronephrosis. --Left kidney/ureter: There are multiple nonobstructing stones in the left kidney without evidence for left-sided hydronephrosis. --Urinary bladder: Unremarkable. Stomach/Bowel: --Stomach/Duodenum: No hiatal hernia or other gastric abnormality. Normal duodenal course and caliber. --Small bowel: Unremarkable. --Colon: There is a large amount of stool at the level of the patient's rectum. There is a large amount of stool throughout the remaining portions of the colon. --Appendix: Normal. Vascular/Lymphatic: There are atherosclerotic changes throughout the abdominal aorta. There is a fusiform infrarenal abdominal aortic aneurysm measuring 3.9 cm. --No retroperitoneal lymphadenopathy. --No mesenteric lymphadenopathy. --No pelvic or inguinal lymphadenopathy. Reproductive: Unremarkable Other: No ascites or free air. There is a fat  containing left inguinal hernia. Musculoskeletal. No acute displaced fractures. IMPRESSION: 1. Large amount of stool at the level of the patient's rectum. There is a large amount of stool throughout the remaining portions of the colon. 2. Hepatic steatosis. 3. Fusiform infrarenal abdominal aortic aneurysm measuring up to 3.9 cm. Recommend follow-up every 2 years. This recommendation follows ACR consensus guidelines: White Paper of the ACR Incidental Findings Committee [name] on Vascular Findings. J Am Coll Radiol 2013; 10:789-794. 4. Fat containing left inguinal hernia. 5. 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 Aortic Atherosclerosis (ICD10-I70.0). Electronically Signed   By: Constance Holster M.D.   On: 09/25/2020 16:20    Procedures Fecal disimpaction  Date/Time: 09/25/2020 11:18 PM Performed by: Charlesetta Shanks, MD Authorized by: Charlesetta Shanks, MD  Consent: Verbal consent obtained. Consent given by: patient Comments: Disimpacted moderate volume of firm stool.  Bright red blood coating stool.  No active bleeding.  Patient subsequently then was able to spontaneously have 2 very large bowel movements.     CRITICAL CARE Performed by: Charlesetta Shanks   Total critical care time: 33 minutes  Critical care time was exclusive of separately billable procedures and treating other patients.  Critical care was necessary to treat or prevent imminent or life-threatening deterioration.  Critical care was time spent personally by me on the following activities: development of treatment plan with patient and/or surrogate as well as nursing, discussions with consultants, evaluation of patient's response to treatment, examination of patient, obtaining history from patient or surrogate, ordering and performing  treatments and interventions, ordering and review of laboratory studies, ordering and review of radiographic studies, pulse oximetry and re-evaluation of patient's condition.  Medications  Ordered in ED Medications  sodium phosphate (FLEET) 7-19 GM/118ML enema 1 enema (1 enema Rectal Patient Refused/Not Given 09/25/20 1927)  lactated ringers infusion (has no administration in time range)  lactated ringers bolus 1,000 mL (has no administration in time range)    And  lactated ringers bolus 1,000 mL (has no administration in time range)    And  lactated ringers bolus 1,000 mL (has no administration in time range)  cefTRIAXone (ROCEPHIN) 2 g in sodium chloride 0.9 % 100 mL IVPB (has no administration in time range)  metroNIDAZOLE (FLAGYL) IVPB 500 mg (has no administration in time range)  witch hazel-glycerin (TUCKS) pad (has no administration in time range)  nicotine (NICODERM CQ - dosed in mg/24 hours) patch 14 mg (has no administration in time range)  lactated ringers bolus 1,000 mL (0 mLs Intravenous Stopped 09/25/20 1753)  HYDROmorphone (DILAUDID) injection 0.5 mg (0.5 mg Intravenous Given 09/25/20 1701)  Glycerin (Adult) 2.1 g suppository 1 suppository (1 suppository Rectal Given 09/25/20 1756)  lactated ringers bolus 1,000 mL (0 mLs Intravenous Stopped 09/25/20 2054)    ED Course  I have reviewed the triage vital signs and the nursing notes.  Pertinent labs & imaging results that were available during my care of the patient were reviewed by me and considered in my medical decision making (see chart for details).  Clinical Course as of 09/25/20 2320  Sat Sep 25, 2020  2313 Consult: Dr. Cyd Silence for admission. [MP]    Clinical Course User Index [MP] Charlesetta Shanks, MD   MDM Rules/Calculators/A&P                          Patient is currently undergoing radiation therapy for right lower lobe neoplasm and suspected of non-small cell lung cancer.  He started developing abdominal discomfort and perception of constipation with some rectal bleeding and decreased urine output.  Patient has leukocytosis, persistent tachycardia after 2 L of lactated Ringer's, lactic acid first at  4 and then 2 after hydration.  At this time patient appears to meet SIRS criteria.  Heart rate remains over 100 after hydration.  Suspect possible bacteremia from fecal impaction and rectal inflammation.  At this time antibiotics started for suspected intra-abdominal infection with plan for admission and continued monitoring and treatment for Sirs\early sepsis Final Clinical Impression(s) / ED Diagnoses Final diagnoses:  SIRS (systemic inflammatory response syndrome) (HCC)  Constipation, unspecified constipation type  Bright red rectal bleeding    Rx / DC Orders ED Discharge Orders    None       Charlesetta Shanks, MD 09/25/20 2326

## 2020-09-26 ENCOUNTER — Other Ambulatory Visit: Payer: Self-pay

## 2020-09-26 ENCOUNTER — Observation Stay (HOSPITAL_COMMUNITY): Payer: Medicare Other

## 2020-09-26 ENCOUNTER — Encounter (HOSPITAL_COMMUNITY): Payer: Self-pay | Admitting: Internal Medicine

## 2020-09-26 DIAGNOSIS — K5900 Constipation, unspecified: Secondary | ICD-10-CM | POA: Diagnosis present

## 2020-09-26 DIAGNOSIS — E872 Acidosis, unspecified: Secondary | ICD-10-CM | POA: Diagnosis present

## 2020-09-26 DIAGNOSIS — N1832 Chronic kidney disease, stage 3b: Secondary | ICD-10-CM | POA: Diagnosis present

## 2020-09-26 DIAGNOSIS — K5641 Fecal impaction: Secondary | ICD-10-CM | POA: Diagnosis not present

## 2020-09-26 DIAGNOSIS — C3491 Malignant neoplasm of unspecified part of right bronchus or lung: Secondary | ICD-10-CM | POA: Diagnosis present

## 2020-09-26 DIAGNOSIS — I5032 Chronic diastolic (congestive) heart failure: Secondary | ICD-10-CM | POA: Diagnosis present

## 2020-09-26 DIAGNOSIS — E876 Hypokalemia: Secondary | ICD-10-CM | POA: Diagnosis present

## 2020-09-26 LAB — CBC WITH DIFFERENTIAL/PLATELET
Abs Immature Granulocytes: 0.05 10*3/uL (ref 0.00–0.07)
Basophils Absolute: 0.1 10*3/uL (ref 0.0–0.1)
Basophils Relative: 0 %
Eosinophils Absolute: 0.1 10*3/uL (ref 0.0–0.5)
Eosinophils Relative: 1 %
HCT: 43.3 % (ref 39.0–52.0)
Hemoglobin: 15.2 g/dL (ref 13.0–17.0)
Immature Granulocytes: 0 %
Lymphocytes Relative: 13 %
Lymphs Abs: 1.5 10*3/uL (ref 0.7–4.0)
MCH: 31.3 pg (ref 26.0–34.0)
MCHC: 35.1 g/dL (ref 30.0–36.0)
MCV: 89.1 fL (ref 80.0–100.0)
Monocytes Absolute: 0.9 10*3/uL (ref 0.1–1.0)
Monocytes Relative: 8 %
Neutro Abs: 8.8 10*3/uL — ABNORMAL HIGH (ref 1.7–7.7)
Neutrophils Relative %: 78 %
Platelets: 178 10*3/uL (ref 150–400)
RBC: 4.86 MIL/uL (ref 4.22–5.81)
RDW: 16.2 % — ABNORMAL HIGH (ref 11.5–15.5)
WBC: 11.3 10*3/uL — ABNORMAL HIGH (ref 4.0–10.5)
nRBC: 0 % (ref 0.0–0.2)

## 2020-09-26 LAB — HEMOGLOBIN A1C
Hgb A1c MFr Bld: 6.3 % — ABNORMAL HIGH (ref 4.8–5.6)
Mean Plasma Glucose: 134.11 mg/dL

## 2020-09-26 LAB — CORTISOL-AM, BLOOD: Cortisol - AM: 18.9 ug/dL (ref 6.7–22.6)

## 2020-09-26 LAB — COMPREHENSIVE METABOLIC PANEL
ALT: 23 U/L (ref 0–44)
AST: 31 U/L (ref 15–41)
Albumin: 3.7 g/dL (ref 3.5–5.0)
Alkaline Phosphatase: 63 U/L (ref 38–126)
Anion gap: 11 (ref 5–15)
BUN: 21 mg/dL (ref 8–23)
CO2: 20 mmol/L — ABNORMAL LOW (ref 22–32)
Calcium: 8.8 mg/dL — ABNORMAL LOW (ref 8.9–10.3)
Chloride: 107 mmol/L (ref 98–111)
Creatinine, Ser: 1.56 mg/dL — ABNORMAL HIGH (ref 0.61–1.24)
GFR, Estimated: 47 mL/min — ABNORMAL LOW (ref >60)
Glucose, Bld: 125 mg/dL — ABNORMAL HIGH (ref 70–99)
Potassium: 3.9 mmol/L (ref 3.5–5.1)
Sodium: 138 mmol/L (ref 135–145)
Total Bilirubin: 1 mg/dL (ref 0.3–1.2)
Total Protein: 6.6 g/dL (ref 6.5–8.1)

## 2020-09-26 LAB — GLUCOSE, CAPILLARY
Glucose-Capillary: 112 mg/dL — ABNORMAL HIGH (ref 70–99)
Glucose-Capillary: 213 mg/dL — ABNORMAL HIGH (ref 70–99)

## 2020-09-26 LAB — LACTIC ACID, PLASMA: Lactic Acid, Venous: 1.8 mmol/L (ref 0.5–1.9)

## 2020-09-26 LAB — PROCALCITONIN: Procalcitonin: 0.26 ng/mL

## 2020-09-26 LAB — MAGNESIUM: Magnesium: 1.9 mg/dL (ref 1.7–2.4)

## 2020-09-26 LAB — D-DIMER, QUANTITATIVE: D-Dimer, Quant: 0.8 ug/mL-FEU — ABNORMAL HIGH (ref 0.00–0.50)

## 2020-09-26 MED ORDER — ASPIRIN EC 81 MG PO TBEC
81.0000 mg | DELAYED_RELEASE_TABLET | Freq: Every day | ORAL | Status: DC
  Administered 2020-09-26: 81 mg via ORAL
  Filled 2020-09-26: qty 1

## 2020-09-26 MED ORDER — ISOSORBIDE MONONITRATE ER 60 MG PO TB24
60.0000 mg | ORAL_TABLET | Freq: Every day | ORAL | Status: DC
  Administered 2020-09-26: 60 mg via ORAL
  Filled 2020-09-26: qty 1

## 2020-09-26 MED ORDER — METOPROLOL TARTRATE 50 MG PO TABS
100.0000 mg | ORAL_TABLET | Freq: Every day | ORAL | Status: DC
Start: 2020-09-26 — End: 2020-09-26
  Administered 2020-09-26: 100 mg via ORAL
  Filled 2020-09-26: qty 2

## 2020-09-26 MED ORDER — ATORVASTATIN CALCIUM 40 MG PO TABS
40.0000 mg | ORAL_TABLET | Freq: Every day | ORAL | Status: DC
  Administered 2020-09-26: 40 mg via ORAL
  Filled 2020-09-26: qty 1

## 2020-09-26 MED ORDER — AMLODIPINE BESYLATE 5 MG PO TABS
5.0000 mg | ORAL_TABLET | Freq: Every day | ORAL | Status: DC
Start: 2020-09-26 — End: 2020-09-26
  Administered 2020-09-26: 5 mg via ORAL
  Filled 2020-09-26: qty 1

## 2020-09-26 MED ORDER — POTASSIUM CHLORIDE CRYS ER 20 MEQ PO TBCR
20.0000 meq | EXTENDED_RELEASE_TABLET | Freq: Once | ORAL | Status: AC
  Administered 2020-09-26: 20 meq via ORAL
  Filled 2020-09-26: qty 1

## 2020-09-26 MED ORDER — POLYETHYLENE GLYCOL 3350 17 G PO PACK: 17.0000 g | PACK | Freq: Every day | ORAL | Status: DC | PRN

## 2020-09-26 MED ORDER — ACETAMINOPHEN 650 MG RE SUPP: 650.0000 mg | Freq: Four times a day (QID) | RECTAL | Status: DC | PRN

## 2020-09-26 MED ORDER — MAGNESIUM SULFATE 2 GM/50ML IV SOLN
2.0000 g | Freq: Once | INTRAVENOUS | Status: AC
  Administered 2020-09-26: 2 g via INTRAVENOUS
  Filled 2020-09-26: qty 50

## 2020-09-26 MED ORDER — INSULIN ASPART 100 UNIT/ML ~~LOC~~ SOLN
0.0000 [IU] | Freq: Three times a day (TID) | SUBCUTANEOUS | Status: DC
  Administered 2020-09-26: 5 [IU] via SUBCUTANEOUS
  Filled 2020-09-26: qty 0.15

## 2020-09-26 MED ORDER — POLYETHYLENE GLYCOL 3350 17 G PO PACK: 17.0000 g | PACK | Freq: Every day | ORAL | 0 refills | Status: DC

## 2020-09-26 MED ORDER — POLYETHYLENE GLYCOL 3350 17 G PO PACK
17.0000 g | PACK | Freq: Every day | ORAL | Status: DC
  Filled 2020-09-26: qty 1

## 2020-09-26 MED ORDER — ACETAMINOPHEN 325 MG PO TABS: 650.0000 mg | ORAL_TABLET | Freq: Four times a day (QID) | ORAL | Status: DC | PRN

## 2020-09-26 MED ORDER — PANTOPRAZOLE SODIUM 40 MG PO TBEC
40.0000 mg | DELAYED_RELEASE_TABLET | Freq: Two times a day (BID) | ORAL | Status: DC
  Administered 2020-09-26 (×2): 40 mg via ORAL
  Filled 2020-09-26 (×2): qty 1

## 2020-09-26 MED ORDER — ENOXAPARIN SODIUM 40 MG/0.4ML ~~LOC~~ SOLN
40.0000 mg | SUBCUTANEOUS | Status: DC
  Administered 2020-09-26: 40 mg via SUBCUTANEOUS
  Filled 2020-09-26: qty 0.4

## 2020-09-26 MED ORDER — SENNA 8.6 MG PO TABS
2.0000 | ORAL_TABLET | Freq: Every day | ORAL | Status: DC
  Administered 2020-09-26: 17.2 mg via ORAL

## 2020-09-26 MED ORDER — CLOPIDOGREL BISULFATE 75 MG PO TABS
75.0000 mg | ORAL_TABLET | Freq: Every day | ORAL | Status: DC
  Administered 2020-09-26: 75 mg via ORAL
  Filled 2020-09-26: qty 1

## 2020-09-26 MED ORDER — NITROGLYCERIN 0.4 MG SL SUBL: 0.4000 mg | SUBLINGUAL_TABLET | SUBLINGUAL | Status: DC | PRN

## 2020-09-26 MED ORDER — ONDANSETRON HCL 4 MG/2ML IJ SOLN: 4.0000 mg | Freq: Four times a day (QID) | INTRAMUSCULAR | Status: DC | PRN

## 2020-09-26 MED ORDER — LACTATED RINGERS IV SOLN: INTRAVENOUS | Status: AC

## 2020-09-26 MED ORDER — ONDANSETRON HCL 4 MG PO TABS: 4.0000 mg | ORAL_TABLET | Freq: Four times a day (QID) | ORAL | Status: DC | PRN

## 2020-09-26 MED ORDER — LOSARTAN POTASSIUM 25 MG PO TABS
25.0000 mg | ORAL_TABLET | Freq: Every day | ORAL | Status: DC
  Administered 2020-09-26: 25 mg via ORAL
  Filled 2020-09-26: qty 1

## 2020-09-26 MED ORDER — SENNA 8.6 MG PO TABS: 1.0000 | ORAL_TABLET | Freq: Every day | ORAL | 0 refills | Status: DC

## 2020-09-26 MED ORDER — ERLOTINIB HCL 150 MG PO TABS: 150.0000 mg | ORAL_TABLET | Freq: Every day | ORAL | Status: DC

## 2020-09-26 NOTE — Plan of Care (Signed)

## 2020-09-26 NOTE — Discharge Summary (Signed)
Physician Discharge Summary  [name] [name] LGX:[id number] DOB: [birth year]  PCP: [name] Margarita, DO  Admitted from: Home Discharged to: Home  Admit date: 09/25/2020 Discharge date: 09/26/2020  Recommendations for Outpatient Follow-up:    Follow-up Information    [name] Margarita, DO. Schedule an appointment as soon as possible for a visit.   Specialty: Internal Medicine Why: To be seen in 4 to 5 days with repeat labs (CBC & BMP). Contact information: [place] [address] [phone]        Sherren Mocha, MD .   Specialty: Cardiology Contact information: [phone] [place] [address] [phone]                Home Health: None    Equipment/Devices: None    Discharge Condition: Improved and stable   Code Status: Full Code Diet recommendation:  Discharge Diet Orders (From admission, onward)    Start     Ordered   09/26/20 0000  Diet - low sodium heart healthy        09/26/20 1603   09/26/20 0000  Diet Carb Modified        09/26/20 1603           Discharge Diagnoses:  Principal Problem:   Fecal impaction in rectum [facility]) Active Problems:   Coronary artery disease involving native coronary artery of native heart without angina pectoris   Essential hypertension   Lactic acidosis   Constipation, acute   Hypokalemia   Hypomagnesemia   Adenocarcinoma of lung, right (HCC)   Chronic diastolic CHF (congestive heart failure) (HCC)   Chronic kidney disease, stage 3b (HCC)   Brief Summary: 70 year old married male, independent, medical history of hypertension, hyperlipidemia, GERD, CAD s/p PCI, chronic diastolic CHF not on diuretics, type [name], adenocarcinoma of the lung, presented to the ED with complaints of severe constipation, urinary retention and weakness.  Rest of HPI is as per Dr. Cyd Silence as noted below.  In summary he was admitted for fecal impaction/constipation which has resolved.  "70 year old male with  a past medical history of hypertension, hyperlipidemia, gastroesophageal reflux disease, coronary artery disease (PCI to RCA and LCx in 2004, stent to LCx in 0277), diastolic congestive heart failure (EF 45-50% 05/2017), diabetes mellitus type 2 and adenocarcinoma of the lung (Dx 2004) presented to [facility] emergency department via EMS for complaints of severe constipation urinary retention and weakness.  Patient explains that in the past several weeks he has been experiencing extremely poor appetite.  Since last Friday, he noticed that he could not move his bowels.  In the days that followed, the patient experienced generalized abdominal discomfort and noticed that he was not able to urinate much.  Upon further questioning, patient denies fevers, sick contacts, recent travel, dysuria, cough, shortness of breath or any contacts with confirmed COVID-19 infection.  Due to patient's progressively worsening generalized weakness, abdominal discomfort, constipation patient contacted EMS who promptly came to evaluate the patient.  Upon their evaluation they noted that the patient was quite tachycardic, administered 350 cc of normal saline and brought the patient to [facility] long hospital for evaluation.  On evaluation in the emergency department patient was found to continue to be tachycardic.  Patient was additionally found to exhibit a substantial leukocytosis of 17.8.  Bladder scan was performed upon arrival as well that did not reveal a significant amount of urine in the bladder suggesting that the patient was dehydrated.  Patient was hydrated with  at least 3 L of lactated Ringer solution.  CT imaging of the abdomen and pelvis without contrast was performed revealing a large amount of stool at the level of the patient's rectum as well as a large amount of stool throughout the remaining portions of the colon.  The ER provider performed a fecal disimpaction on the patient and successfully was able  to get the patient to have several large bowel movements.  Due to patient's ongoing SIRS and concern for underlying infection the hospitalist group was called to assess the patient for admission the hospital."   Assessment and plan:  Fecal impaction/chronic constipation: Patient has history of chronic constipation.  He is not on regular daily bowel regimen.  He uses some medications as needed when he gets constipated.  EDP performed digital disimpaction in the ED following which she has had multiple good BMs.  Follow-up KUB this morning showed minimal stools.  Patient reported some loose BM/diarrhea this morning which has since resolved.  He will be discharged on regular MiraLAX and senna.  He has been counseled extensively to use these medications to achieve regular daily BMs and counseled as to how he can titrate this medications as needed.  He verbalized understanding.  Screening colonoscopy 04/20/2015 showed sigmoid colon diverticulosis but otherwise normal.  He is advised to follow-up with his PCP to determine if he needs a repeat screening colonoscopy.  He reported minimal rectal bleeding initially which has since resolved.  Consider following up TSH as outpatient if one not recently done  SIRS Initially had SIRS physiology including tachycardia, leukocytosis with elevated lactate with initial concerns for potential infection.  He did get a dose of IV ceftriaxone and metronidazole on arrival.  However on careful further evaluation, we did not find any obvious evidence of source of infection and antibiotics were not continued.  Chest x-ray without acute infiltrate.  CT abdomen revealed rectal fecal impaction and significant constipation.  UA not indicative of UTI.  COVID-19 and flu panel testing negative.  He has been afebrile.  Leukocytosis is almost resolved-which was likely a stress response.  Blood cultures were drawn and can be followed up as outpatient.  Patient has been counseled to monitor  closely for infection symptoms and seek immediate medical attention if he has any.  He verbalized understanding.  Lipase normal.  Procalcitonin 0.26.  He did not really have urinary retention and has been voiding without difficulty.  Not sure why D-dimer was requested, elevated at 0.8 but this is nonspecific, low index of suspicion for VTE in the absence of chest pain or dyspnea.  No asymmetric lower extremity swelling or pain.  Lactic acidosis: Presented with elevated lactate of 4.1.  Suspect due to dehydration.  Low index of suspicion for infectious etiology.  This is resolved after IV fluids.  CAD s/p PCI Asymptomatic of chest pain.  Continue prior cardiac medications.  Essential hypertension Controlled.  Continue prior home regimen.  Hypokalemia/hypomagnesemia Replaced.  Adenocarcinoma of the lung Outpatient follow-up with oncology.  Reportedly receiving radiation treatment along with Tarceva.  Chronic diastolic CHF Clinically euvolemic.  Not on diuretics PTA.  Acute kidney injury complicating stage IIIb chronic kidney disease Baseline creatinine may be in the 1.8-1.9 range.  He presented with creatinine of 1.95.  Following IV hydration, this is improved to 1.56.  He does not see a nephrologist.  May consider outpatient nephrology follow-up.  Follow BMP closely as outpatient.  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  Continue prior home regimen.  Infrarenal abdominal aortic aneurysm 3.9 cm Noted on CT abdomen.  Outpatient follow-up as deemed necessary  Consultations:  None  Procedures:  None   Discharge Instructions  Discharge Instructions    Call MD for:  difficulty breathing, headache or visual disturbances   Complete by: As directed    Call MD for:  extreme fatigue   Complete by: As directed    Call MD for:  persistant dizziness or light-headedness   Complete by: As directed    Call MD for:  persistant nausea and vomiting    Complete by: As directed    Call MD for:  severe uncontrolled pain   Complete by: As directed    Call MD for:  temperature >100.4   Complete by: As directed    Diet - low sodium heart healthy   Complete by: As directed    Diet Carb Modified   Complete by: As directed    Increase activity slowly   Complete by: As directed        Medication List    TAKE these medications   Accu-Chek Aviva Plus test strip Generic drug: glucose blood 1 each daily.   amLODipine 5 MG tablet Commonly known as: NORVASC Take 1 tablet (5 mg total) by mouth daily.   aspirin EC 81 MG tablet Take 81 mg by mouth daily. Swallow whole.   atorvastatin 40 MG tablet Commonly known as: LIPITOR Take 40 mg by mouth daily.   cetirizine 10 MG chewable tablet Commonly known as: ZYRTEC Chew 10 mg by mouth daily as needed for allergies.   clopidogrel 75 MG tablet Commonly known as: PLAVIX TAKE 1 TABLET (75 MG TOTAL) BY MOUTH DAILY.   erlotinib 150 MG tablet Commonly known as: TARCEVA Take 1 tablet (150 mg total) by mouth daily. Take on an empty stomach 1 hour before meals or 2 hours after.   ferrous sulfate 324 MG Tbec Take 324 mg by mouth.   freestyle lancets 1 each daily.   glimepiride 4 MG tablet Commonly known as: AMARYL Take 4 mg by mouth daily.   isosorbide mononitrate 60 MG 24 hr tablet Commonly known as: IMDUR Take 1 tablet (60 mg total) by mouth daily.   JARDIANCE PO Take 25 mg by mouth daily.   losartan 25 MG tablet Commonly known as: COZAAR Take 25 mg by mouth daily.   metoprolol tartrate 100 MG tablet Commonly known as: LOPRESSOR Take 100 mg by mouth daily. Take one tablet by mouth daily with or following a meal   nitroGLYCERIN 0.4 MG SL tablet Commonly known as: Nitrostat Place 1 tablet (0.4 mg total) under the tongue every 5 (five) minutes as needed for chest pain (up to 3 doses).   omeprazole 20 MG capsule Commonly known as: PRILOSEC Take 20 mg by mouth 2 (two) times  daily.   polyethylene glycol 17 g packet Commonly known as: MIRALAX / GLYCOLAX Take 17 g by mouth daily. Temporarily hold if you have diarrhea. Start taking on: September 27, 2020   senna 8.6 MG Tabs tablet Commonly known as: SENOKOT Take 1 tablet (8.6 mg total) by mouth at bedtime. Temporarily hold if you have diarrhea.   Tradjenta 5 MG Tabs tablet Generic drug: linagliptin Take 5 mg by mouth daily.   VITAMIN B 12 PO Take by mouth. Taking daily   zaleplon 10 MG capsule Commonly known as: SONATA Take 10 mg by mouth at bedtime.      Allergies  Allergen  Reactions  . Iohexol Other (See Comments)     Code: HIVES, Desc: pt recieves 50mg  benadryl 1 hour prior to scan   . Morphine And Related Anxiety    hallucinations  . Other Rash    chlorascrub when starting IV      Procedures/Studies: CT Abdomen Pelvis Wo Contrast  Result Date: 09/25/2020 CLINICAL DATA:  Urinary retention.  Constipation. EXAM: CT ABDOMEN AND PELVIS WITHOUT CONTRAST TECHNIQUE: Multidetector CT imaging of the abdomen and pelvis was performed following the standard protocol without IV contrast. COMPARISON:  CT dated 12/06/2010 FINDINGS: Lower chest: The lung bases are clear. The heart size is normal. Hepatobiliary: There is decreased hepatic attenuation suggestive of hepatic steatosis. Normal gallbladder.There is no biliary ductal dilation. Pancreas: Normal contours without ductal dilatation. No peripancreatic fluid collection. Spleen: Unremarkable. Adrenals/Urinary Tract: --Adrenal glands: Unremarkable. --Right kidney/ureter: There multiple punctate nonobstructing stones in the right kidney. No right sided hydronephrosis. --Left kidney/ureter: There are multiple nonobstructing stones in the left kidney without evidence for left-sided hydronephrosis. --Urinary bladder: Unremarkable. Stomach/Bowel: --Stomach/Duodenum: No hiatal hernia or other gastric abnormality. Normal duodenal course and caliber. --Small bowel:  Unremarkable. --Colon: There is a large amount of stool at the level of the patient's rectum. There is a large amount of stool throughout the remaining portions of the colon. --Appendix: Normal. Vascular/Lymphatic: There are atherosclerotic changes throughout the abdominal aorta. There is a fusiform infrarenal abdominal aortic aneurysm measuring 3.9 cm. --No retroperitoneal lymphadenopathy. --No mesenteric lymphadenopathy. --No pelvic or inguinal lymphadenopathy. Reproductive: Unremarkable Other: No ascites or free air. There is a fat containing left inguinal hernia. Musculoskeletal. No acute displaced fractures. IMPRESSION: 1. Large amount of stool at the level of the patient's rectum. There is a large amount of stool throughout the remaining portions of the colon. 2. Hepatic steatosis. 3. Fusiform infrarenal abdominal aortic aneurysm measuring up to 3.9 cm. Recommend follow-up every 2 years. This recommendation follows ACR consensus guidelines: White Paper of the ACR Incidental Findings Committee [name] on Vascular Findings. J Am Coll Radiol 2013; 10:789-794. 4. Fat containing left inguinal hernia. 5. 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 Aortic Atherosclerosis (ICD10-I70.0). Electronically Signed   By: Constance Holster M.D.   On: 09/25/2020 16:20   DG Chest 1 View  Result Date: 09/26/2020 CLINICAL DATA:  Urinary retention.  History of lung cancer. EXAM: CHEST  1 VIEW COMPARISON:  12/05/2017 FINDINGS: Heart size is normal. Coronary artery stent in place. Aortic atherosclerotic calcification is noted. The left lung is clear. Chronic deformity of the right posterior seventh and eighth ribs presumably related to previous chest surgery. Right lung appears clear. No heart failure or effusion. Two surgical clips in the upper medial left chest. IMPRESSION: No active disease. Coronary artery stent. Aortic atherosclerotic calcification. Chronic deformity of the right posterior seventh and eighth ribs.  Electronically Signed   By: Nelson Chimes M.D.   On: 09/26/2020 00:54   DG Abd Portable 1V  Result Date: 09/26/2020 CLINICAL DATA:  Constipation. EXAM: PORTABLE ABDOMEN - 1 VIEW COMPARISON:  CT abdomen and pelvis September 25, 2020 FINDINGS: There is fairly mild stool volume in the colon. There is no bowel dilatation or air-fluid level to suggest bowel obstruction. No free air. Lung bases are clear. No abnormal calcifications. IMPRESSION: Fairly mild stool volume in colon. No bowel obstruction or free air evident. Lung bases clear. Electronically Signed   By: Lowella Grip III M.D.   On: 09/26/2020 08:59      Subjective: Patient reports feeling much better.  Had 3-4 BMs since morning.  Initial ones were loose/watery but the last one had little to no output and not watery.  No abdominal pain.  No blood in stools.  No nausea or vomiting.  Tolerating diet.  No chest pain, dyspnea, dizziness or lightheadedness.  Has been ambulating by himself steadily to the bathroom.  Discharge Exam:  Vitals:   09/26/20 0510 09/26/20 0951 09/26/20 0952 09/26/20 1125  BP: (!) 145/77 131/65  114/66  Pulse: 91 89 89 90  Resp: 20 15  20   Temp: 97.7 F (36.5 C) (!) 97.4 F (36.3 C)  97.6 F (36.4 C)  TempSrc: Oral Oral  Oral  SpO2: 98% 99%  97%  Weight:      Height:        General: Pleasant elderly male, moderately built and nourished lying comfortably propped up in bed without distress.  Oral mucosa moist. Cardiovascular: S1 & S2 heard, RRR, S1/S2 +. No murmurs, rubs, gallops or clicks. No JVD or pedal edema.  Telemetry personally reviewed: Sinus rhythm. Respiratory: Clear to auscultation without wheezing, rhonchi or crackles. No increased work of breathing. Abdominal:  Non distended, non tender & soft. No organomegaly or masses appreciated. Normal bowel sounds heard. CNS: Alert and oriented. No focal deficits. Extremities: no edema, no cyanosis    The results of significant diagnostics from this  hospitalization (including imaging, microbiology, ancillary and laboratory) are listed below for reference.     Microbiology: Recent Results (from the past 240 hour(s))  Resp Panel by RT-PCR (Flu A&B, Covid) Nasopharyngeal Swab     Status: None   Collection Time: 09/25/20  5:07 PM   Specimen: Nasopharyngeal Swab; Nasopharyngeal(NP) swabs in vial transport medium  Result Value Ref Range Status   SARS Coronavirus 2 by RT PCR NEGATIVE NEGATIVE Final    Comment: (NOTE) SARS-CoV-2 target nucleic acids are NOT DETECTED.  The SARS-CoV-2 RNA is generally detectable in upper respiratory specimens during the acute phase of infection. The lowest concentration of SARS-CoV-2 viral copies this assay can detect is 138 copies/mL. A negative result does not preclude SARS-Cov-2 infection and should not be used as the sole basis for treatment or other patient management decisions. A negative result may occur with  improper specimen collection/handling, submission of specimen other than nasopharyngeal swab, presence of viral mutation(s) within the areas targeted by this assay, and inadequate number of viral copies(<138 copies/mL). A negative result must be combined with clinical observations, patient history, and epidemiological information. The expected result is Negative.  Fact Sheet for Patients:  EntrepreneurPulse.com.au  Fact Sheet for Healthcare Providers:  IncredibleEmployment.be  This test is no t yet approved or cleared by the [place] FDA and  has been authorized for detection and/or diagnosis of SARS-CoV-2 by FDA under an Emergency Use Authorization (EUA). This EUA will remain  in effect (meaning this test can be used) for the duration of the COVID-19 declaration under Section 564(b)(1) of the Act, 21 U.S.C.section 360bbb-3(b)(1), unless the authorization is terminated  or revoked sooner.       Influenza A by PCR NEGATIVE NEGATIVE Final    Influenza B by PCR NEGATIVE NEGATIVE Final    Comment: (NOTE) The Xpert Xpress SARS-CoV-2/FLU/RSV plus assay is intended as an aid in the diagnosis of influenza from Nasopharyngeal swab specimens and should not be used as a sole basis for treatment. Nasal washings and aspirates are unacceptable for Xpert Xpress SARS-CoV-2/FLU/RSV testing.  Fact Sheet for Patients: EntrepreneurPulse.com.au  Fact Sheet for Healthcare Providers: IncredibleEmployment.be  This test is not yet approved or cleared by the [place] and has been authorized for detection and/or diagnosis of SARS-CoV-2 by FDA under an Emergency Use Authorization (EUA). This EUA will remain in effect (meaning this test can be used) for the duration of the COVID-19 declaration under Section 564(b)(1) of the Act, 21 U.S.C. section 360bbb-3(b)(1), unless the authorization is terminated or revoked.  Performed at [facility], [place] [address], [place], [place] [address]      Labs: CBC: Recent Labs  Lab 09/25/20 1712 09/26/20 0330  WBC 17.8* 11.3*  NEUTROABS 14.5* 8.8*  HGB 17.1* 15.2  HCT 50.0 43.3  MCV 89.9 89.1  PLT 241 321    Basic Metabolic Panel: Recent Labs  Lab 09/25/20 1712 09/26/20 0330  NA 138 138  K 3.4* 3.9  CL 104 107  CO2 19* 20*  GLUCOSE 133* 125*  BUN 24* 21  CREATININE 1.95* 1.56*  CALCIUM 9.5 8.8*  MG 1.6* 1.9  PHOS 2.2*  --     Liver Function Tests: Recent Labs  Lab 09/25/20 1712 09/26/20 0330  AST 29 31  ALT 26 23  ALKPHOS 72 63  BILITOT 1.4* 1.0  PROT 7.7 6.6  ALBUMIN 4.2 3.7    CBG: Recent Labs  Lab 09/26/20 0758 09/26/20 1122  GLUCAP 112* 213*    Hgb A1c Recent Labs    09/26/20 0330  HGBA1C 6.3*    Urinalysis    Component Value Date/Time   COLORURINE YELLOW 09/25/2020 1919   APPEARANCEUR CLEAR 09/25/2020 1919   LABSPEC 1.021 09/25/2020 1919   PHURINE 5.0 09/25/2020 1919   GLUCOSEU >=500 (A)  09/25/2020 1919   HGBUR SMALL (A) 09/25/2020 [place] NEGATIVE 09/25/2020 1919   KETONESUR 20 (A) 09/25/2020 [place] NEGATIVE 09/25/2020 1919   NITRITE NEGATIVE 09/25/2020 1919   LEUKOCYTESUR NEGATIVE 09/25/2020 1919    I offered to speak with patient's spouse who was by his bedside while I was speaking with him on the phone, to answer any questions that she may have.  They kindly declined the offer.  Time coordinating discharge: 25 minutes  SIGNED:  Vernell Leep, MD, [place], [facility]. [facility]  To contact the attending provider between 7A-7P or the covering provider during after hours 7P-7A, please log into the web site [email or web address] and access using universal [place] password for that web site. If you do not have the password, please call the hospital operator.

## 2020-09-26 NOTE — Progress Notes (Signed)
Patient discharged home with wife, discharge instructions given and explained to patient/wife, they verbalized understanding, patient denies any pain/distress, no pressure injury noted. Accompanied home by wife.

## 2020-09-26 NOTE — Discharge Instructions (Signed)

## 2020-09-26 NOTE — H&P (Signed)
History and Physical    [name] BJS:[id number] DOB: 28-Jul-1951 DOA: 09/25/2020  PCP: [name], DO  Patient coming from: Home via EMS   Chief Complaint:  Chief Complaint  Patient presents with  . Urinary Retention     HPI:    70 year old male with a past medical history of hypertension, hyperlipidemia, gastroesophageal reflux disease, coronary artery disease (PCI to RCA and LCx in 2004, stent to LCx in 6073), diastolic congestive heart failure (EF 45-50% 05/2017), diabetes mellitus type 2 and adenocarcinoma of the lung (Dx 2004) presented to [facility] emergency department via EMS for complaints of severe constipation urinary retention and weakness.  Patient explains that in the past several weeks he has been experiencing extremely poor appetite.  Since last Friday, he noticed that he could not move his bowels.  In the days that followed, the patient experienced generalized abdominal discomfort and noticed that he was not able to urinate much.  Upon further questioning, patient denies fevers, sick contacts, recent travel, dysuria, cough, shortness of breath or any contacts with confirmed COVID-19 infection.  Due to patient's progressively worsening generalized weakness, abdominal discomfort, constipation patient contacted EMS who promptly came to evaluate the patient.  Upon their evaluation they noted that the patient was quite tachycardic, administered 350 cc of normal saline and brought the patient to [facility] long hospital for evaluation.  On evaluation in the emergency department patient was found to continue to be tachycardic.  Patient was additionally found to exhibit a substantial leukocytosis of 17.8.  Bladder scan was performed upon arrival as well that did not reveal a significant amount of urine in the bladder suggesting that the patient was dehydrated.  Patient was hydrated with at least 3 L of lactated Ringer solution.  CT imaging of the abdomen and pelvis  without contrast was performed revealing a large amount of stool at the level of the patient's rectum as well as a large amount of stool throughout the remaining portions of the colon.  The ER provider performed a fecal disimpaction on the patient and successfully was able to get the patient to have several large bowel movements.  Due to patient's ongoing SIRS and concern for underlying infection the hospitalist group was called to assess the patient for admission the hospital.  Review of Systems:   Review of Systems  Gastrointestinal: Positive for constipation.  Neurological: Positive for weakness.  All other systems reviewed and are negative.   Past Medical History:  Diagnosis Date  . AAA (abdominal aortic aneurysm) (HCC)    a. 3.4 x 3.5 on scan 02/28/13.  . Allergic reaction to contrast dye   . Atherosclerosis   . CAD (coronary artery disease)    a. stenting of RCA/LCx 2004. b. [place] 08/2013:  s/p DES to LCx for severe ISR.  . Diabetes mellitus   . Diverticulosis   . Emphysema lung ([place])   . Emphysema of lung ([place])   . Encounter for therapeutic drug monitoring 06/06/2016  . Hemorrhoids   . Hepatic steatosis   . HTN (hypertension)   . Hyperlipidemia   . Kidney stone   . Lung cancer ([place]) dx'd 2004   chemo/xrt comp; tarceva ongoing    Past Surgical History:  Procedure Laterality Date  . ANGIOPLASTY / STENTING FEMORAL  2015   2004-groin  . LEFT HEART CATHETERIZATION WITH CORONARY ANGIOGRAM N/A 09/08/2013   Procedure: LEFT HEART CATHETERIZATION WITH CORONARY ANGIOGRAM;  Surgeon: Blane Ohara, MD;  Location: [facility] CATH LAB;  Service: Cardiovascular;  Laterality: N/A;  . left partial lobectomy    . WISDOM TOOTH EXTRACTION       reports that he quit smoking about 18 years ago. He has never used smokeless tobacco. He reports that he does not drink alcohol and does not use drugs.  Allergies  Allergen Reactions  . Iohexol Other (See Comments)     Code: HIVES, Desc: pt recieves  50mg  benadryl 1 hour prior to scan   . Morphine And Related Anxiety    hallucinations  . Other Rash    chlorascrub when starting IV    Family History  Problem Relation Age of Onset  . Heart disease Sister   . Diabetes Sister   . Ovarian cancer Sister   . Colon cancer Neg Hx   . Esophageal cancer Neg Hx   . Rectal cancer Neg Hx   . Stomach cancer Neg Hx      Prior to Admission medications   Medication Sig Start Date End Date Taking? Authorizing Provider  ACCU-CHEK AVIVA PLUS test strip 1 each daily. 06/08/20  Yes [provider]  amLODipine (NORVASC) 5 MG tablet Take 1 tablet (5 mg total) by mouth daily. 07/07/19 07/01/20 Yes Sherren Mocha, MD  aspirin EC 81 MG tablet Take 81 mg by mouth daily. Swallow whole.   Yes [provider]  atorvastatin (LIPITOR) 40 MG tablet Take 40 mg by mouth daily.   Yes [provider]  cetirizine (ZYRTEC) 10 MG chewable tablet Chew 10 mg by mouth daily as needed for allergies.   Yes [provider]  clopidogrel (PLAVIX) 75 MG tablet TAKE 1 TABLET (75 MG TOTAL) BY MOUTH DAILY. 08/10/14  Yes Sherren Mocha, MD  Cyanocobalamin (VITAMIN B 12 PO) Take by mouth. Taking daily   Yes [provider]  Empagliflozin (JARDIANCE PO) Take 25 mg by mouth daily. 06/01/20  Yes [provider]  erlotinib (TARCEVA) 150 MG tablet Take 1 tablet (150 mg total) by mouth daily. Take on an empty stomach 1 hour before meals or 2 hours after. 09/07/20  Yes Curt Bears, MD  ferrous sulfate 324 MG TBEC Take 324 mg by mouth.   Yes [provider]  glimepiride (AMARYL) 4 MG tablet Take 4 mg by mouth daily.   Yes [provider]  isosorbide mononitrate (IMDUR) 60 MG 24 hr tablet Take 1 tablet (60 mg total) by mouth daily. 06/28/20 09/26/20 Yes Burtis Junes, NP  Lancets (FREESTYLE) lancets 1 each daily. 06/08/20  Yes [provider]  losartan (COZAAR) 25 MG tablet Take 25 mg by mouth daily.  11/20/19  Yes [provider]  metoprolol (LOPRESSOR) 100 MG tablet Take 100 mg by mouth daily. Take one tablet by mouth daily with or following a meal 08/07/14  Yes [provider]  nitroGLYCERIN (NITROSTAT) 0.4 MG SL tablet Place 1 tablet (0.4 mg total) under the tongue every 5 (five) minutes as needed for chest pain (up to 3 doses). 09/09/13  Yes Dunn, Dayna N, PA-C  omeprazole (PRILOSEC) 20 MG capsule Take 20 mg by mouth 2 (two) times daily. 09/24/16  Yes [provider]  TRADJENTA 5 MG TABS tablet Take 5 mg by mouth daily. 06/01/20  Yes [provider]  zaleplon (SONATA) 10 MG capsule Take 10 mg by mouth at bedtime. 05/04/18  Yes [provider]    Physical Exam: Vitals:   09/26/20 0400 09/26/20 0430 09/26/20 0504 09/26/20 0510  BP: (!) 154/75 139/78  (!) 145/77  Pulse: 92 99  91  Resp: 16 11  20   Temp:    97.7 F (36.5 C)  TempSrc:    Oral  SpO2: 94% 94%  98%  Weight:   88.5 kg   Height:   6\' 1"  (1.854 m)     Constitutional: Acute alert and oriented x3, no associated distress.   Skin: no rashes, no lesions, extremely poor skin turgor noted.   Eyes: Pupils are equally reactive to light.  No evidence of scleral icterus or conjunctival pallor.  ENMT: Extremely dry mucous membranes noted.  Posterior pharynx clear of any exudate or lesions.  Neck: normal, supple, no masses, no thyromegaly.  No evidence of jugular venous distension.   Respiratory: clear to auscultation bilaterally, no wheezing, no crackles. Normal respiratory effort. No accessory muscle use.  Cardiovascular: Regular rate and rhythm, no murmurs / rubs / gallops. No extremity edema. 2+ pedal pulses. No carotid bruits.  Chest:   Nontender without crepitus or deformity.   Back:   Nontender without crepitus or deformity. Abdomen: Mild lower abdominal tenderness with palpation.  Abdomen is soft and nontender however. .  No evidence of intra-abdominal masses.  Positive bowel sounds  noted in all quadrants.   Musculoskeletal: No joint deformity upper and lower extremities. Good ROM, no contractures. Normal muscle tone.  Neurologic: CN 2-12 grossly intact. Sensation intact.  Patient moving all 4 extremities spontaneously.  Patient is following all commands.  Patient is responsive to verbal stimuli.   Psychiatric: Patient exhibits normal mood with appropriate affect.  Patient seems to possess insight as to their current situation.     Labs on Admission: I have personally reviewed following labs and imaging studies -   CBC: Recent Labs  Lab 09/25/20 1712 09/26/20 0330  WBC 17.8* 11.3*  NEUTROABS 14.5* 8.8*  HGB 17.1* 15.2  HCT 50.0 43.3  MCV 89.9 89.1  PLT 241 256   Basic Metabolic Panel: Recent Labs  Lab 09/25/20 1712 09/26/20 0330  NA 138 138  K 3.4* 3.9  CL 104 107  CO2 19* 20*  GLUCOSE 133* 125*  BUN 24* 21  CREATININE 1.95* 1.56*  CALCIUM 9.5 8.8*  MG 1.6* 1.9  PHOS 2.2*  --    GFR: Estimated Creatinine Clearance: 49.1 mL/min (A) (by C-G formula based on SCr of 1.56 mg/dL (H)). Liver Function Tests: Recent Labs  Lab 09/25/20 1712 09/26/20 0330  AST 29 31  ALT 26 23  ALKPHOS 72 63  BILITOT 1.4* 1.0  PROT 7.7 6.6  ALBUMIN 4.2 3.7   Recent Labs  Lab 09/25/20 1712  LIPASE 36   No results for input(s): AMMONIA in the last 168 hours. Coagulation Profile: Recent Labs  Lab 09/25/20 1712  INR 1.1   Cardiac Enzymes: No results for input(s): CKTOTAL, CKMB, CKMBINDEX, TROPONINI in the last 168 hours. BNP (last 3 results) No results for input(s): PROBNP in the last 8760 hours. HbA1C: No results for input(s): HGBA1C in the last 72 hours. CBG: No results for input(s): GLUCAP in the last 168 hours. Lipid Profile: No results for input(s): CHOL, HDL, LDLCALC, TRIG, CHOLHDL, LDLDIRECT in the last 72 hours. Thyroid Function Tests: No results for input(s): TSH, T4TOTAL, FREET4, T3FREE, THYROIDAB in the last 72 hours. Anemia Panel: No  results for input(s): VITAMINB12, FOLATE, FERRITIN, TIBC, IRON, RETICCTPCT in the last 72 hours. Urine analysis:    Component Value Date/Time   COLORURINE YELLOW 09/25/2020 1919   APPEARANCEUR CLEAR 09/25/2020 1919   LABSPEC 1.021 09/25/2020  [place] 5.0 09/25/2020 1919   GLUCOSEU >=500 (A) 09/25/2020 1919   HGBUR SMALL (A) 09/25/2020 1919   BILIRUBINUR NEGATIVE 09/25/2020 1919   KETONESUR 20 (A) 09/25/2020 1919   PROTEINUR NEGATIVE 09/25/2020 1919   NITRITE NEGATIVE 09/25/2020 1919   LEUKOCYTESUR NEGATIVE 09/25/2020 1919    Radiological Exams on Admission - Personally Reviewed: CT Abdomen Pelvis Wo Contrast  Result Date: 09/25/2020 CLINICAL DATA:  Urinary retention.  Constipation. EXAM: CT ABDOMEN AND PELVIS WITHOUT CONTRAST TECHNIQUE: Multidetector CT imaging of the abdomen and pelvis was performed following the standard protocol without IV contrast. COMPARISON:  CT dated 12/06/2010 FINDINGS: Lower chest: The lung bases are clear. The heart size is normal. Hepatobiliary: There is decreased hepatic attenuation suggestive of hepatic steatosis. Normal gallbladder.There is no biliary ductal dilation. Pancreas: Normal contours without ductal dilatation. No peripancreatic fluid collection. Spleen: Unremarkable. Adrenals/Urinary Tract: --Adrenal glands: Unremarkable. --Right kidney/ureter: There multiple punctate nonobstructing stones in the right kidney. No right sided hydronephrosis. --Left kidney/ureter: There are multiple nonobstructing stones in the left kidney without evidence for left-sided hydronephrosis. --Urinary bladder: Unremarkable. Stomach/Bowel: --Stomach/Duodenum: No hiatal hernia or other gastric abnormality. Normal duodenal course and caliber. --Small bowel: Unremarkable. --Colon: There is a large amount of stool at the level of the patient's rectum. There is a large amount of stool throughout the remaining portions of the colon. --Appendix: Normal. Vascular/Lymphatic: There  are atherosclerotic changes throughout the abdominal aorta. There is a fusiform infrarenal abdominal aortic aneurysm measuring 3.9 cm. --No retroperitoneal lymphadenopathy. --No mesenteric lymphadenopathy. --No pelvic or inguinal lymphadenopathy. Reproductive: Unremarkable Other: No ascites or free air. There is a fat containing left inguinal hernia. Musculoskeletal. No acute displaced fractures. IMPRESSION: 1. Large amount of stool at the level of the patient's rectum. There is a large amount of stool throughout the remaining portions of the colon. 2. Hepatic steatosis. 3. Fusiform infrarenal abdominal aortic aneurysm measuring up to 3.9 cm. Recommend follow-up every 2 years. This recommendation follows ACR consensus guidelines: White Paper of the ACR Incidental Findings Committee [name] on Vascular Findings. J Am Coll Radiol 2013; 10:789-794. 4. Fat containing left inguinal hernia. 5. 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 Aortic Atherosclerosis (ICD10-I70.0). Electronically Signed   By: Constance Holster M.D.   On: 09/25/2020 16:20   DG Chest 1 View  Result Date: 09/26/2020 CLINICAL DATA:  Urinary retention.  History of lung cancer. EXAM: CHEST  1 VIEW COMPARISON:  12/05/2017 FINDINGS: Heart size is normal. Coronary artery stent in place. Aortic atherosclerotic calcification is noted. The left lung is clear. Chronic deformity of the right posterior seventh and eighth ribs presumably related to previous chest surgery. Right lung appears clear. No heart failure or effusion. Two surgical clips in the upper medial left chest. IMPRESSION: No active disease. Coronary artery stent. Aortic atherosclerotic calcification. Chronic deformity of the right posterior seventh and eighth ribs. Electronically Signed   By: Nelson Chimes M.D.   On: 09/26/2020 00:54    Telemetry: Personally reviewed.  Bedside telemetry reveals sinus tachycardia at 105 bpm.  Assessment/Plan Principal Problem:   SIRS (systemic  inflammatory response syndrome) (HCC)   Patient is exhibiting multiple SIRS criteria including tachycardia and leukocytosis of 17.8  Concurrent lactic acidosis also brings up concern for potential infection  ER provider did administer 1 dose of intravenous ceftriaxone and intravenous metronidazole.  However, I do not find any obvious evidence of a source of infection at this time and therefore will abstain from further antibiotics.  Chest x-ray reveals no  evidence of acute infiltrate  CT of the abdomen reveals substantial constipation (prior to disimpaction) but no evidence of active infection  Urinalysis is not suggestive of infection  COVID-19 testing is negative  Blood cultures have been ordered   Continue to monitor clinically for evidence of infection  Active Problems:   Lactic acidosis   Patient presenting with substantial lactic acidosis on arrival of 4.1  Likely primarily due to volume depletion although a contributing infectious process is less likely but possible  Hydrating patient with intravenous isotonic fluids  Performing serial lactic acid levels to ensure downtrending and resolution    Constipation, acute   Patient presenting with extensive constipation on arrival  , Patient states that he has not moved his bowels in 2 days, more than likely he has been constipated to this degree for several weeks to months with chronic incomplete evacuation of his bowels  Patient is already status post disimpaction here in the emergency department  I have placed the patient on maintenance laxatives with senna nightly and MiraLAX daily  Hydrating patient with intravenous isotonic fluids as substantial dehydration can exacerbate constipation  Patient reports having a normal colonoscopy 3 years ago.  I have encouraged him to follow-up with his gastroenterologist as an outpatient for consideration of repeat colonoscopy    Coronary artery disease involving native coronary  artery of native heart without angina pectoris   Patient is currently chest pain-free  Monitoring patient on telemetry    Essential hypertension  . Resume patients home regimen or oral antihypertensives . Titrate antihypertensive regimen as necessary to achieve adequate BP control . PRN intravenous antihypertensives for excessively elevated blood pressure     Hypokalemia   Replacing with oral potassium chloride  Correcting concurrent hypomagnesemia as well  Monitoring potassium levels with serial chemistries.    Hypomagnesemia   Replacing with intravenous magnesium sulfate  Monitoring magnesium levels with serial chemistries.    Adenocarcinoma of lung, right [facility])   Outpatient follow-up  Patient actively receiving radiation therapy as well as longstanding use of  Tarceva    Chronic diastolic CHF (congestive heart failure) (HCC)   No clinical evidence of volume overload  Chronic kidney disease, stage 3b (HCC)  Creatinine near baseline  Strict input and output monitoring  Monitor renal function and electrolytes with serial chemistries  Minimizing nephrotoxic agents as much as possible    Code Status:  Full code Family Communication: deferred   Status is: Observation  The patient remains OBS appropriate and will d/c before 2 midnights.  Dispo: The patient is from: Home              Anticipated d/c is to: Home              Anticipated d/c date is: 2 days              Patient currently is not medically stable to d/c.   Difficult to place patient No        Vernelle Emerald MD [facility] Pager [phone]  If 7PM-7AM, please contact night-coverage [email or web address] Use universal [place] password for that web site. If you do not have the password, please call the hospital operator.  09/26/2020, 7:26 AM

## 2020-09-26 NOTE — Progress Notes (Signed)
Per Secure Chat with Clarise Cruz, RN, only Lactics were able to be drawn and not blood cultures prior to antibiotics given.

## 2020-09-26 NOTE — Progress Notes (Signed)
Erlotinib (Tarceva) hold criteria  SCr > 1.5x baseline (or > 2 if baseline unknown)  AST or ALT > 3x ULN  Bili > 1.5x ULN  Acute coronary syndrome  Acute CVA  Bullous or exfoliative skin eruption  Gastrointestinal perforation  Unexplained pneumonitis / hypoxemia  Active infection   Per hold criteria erlotinib d/c'd due to active infection  Dolly Rias RPh 09/26/2020, 1:02 AM

## 2020-09-28 ENCOUNTER — Telehealth: Payer: Self-pay | Admitting: *Deleted

## 2020-09-28 ENCOUNTER — Telehealth: Payer: Self-pay | Admitting: Cardiovascular Disease

## 2020-09-28 ENCOUNTER — Telehealth: Payer: Self-pay

## 2020-09-28 NOTE — Telephone Encounter (Signed)
Nutrition Follow-up:  Patient called RD with questions regarding heart healthy diet and diet to help constipation with recent hospital visit.    History of DM, HTN, CHF, HLD. Patient with metastatic lung cancer currently on erolotinib.  Spoke with patient via phone and wife joined visit as well.    Anthropometrics:   Weight 195 lb on 2/13.  198 lb on 1/5   INTERVENTION:  Encouraged bowel regimen (senna and miralax prescribed in hospital). Encouraged fluids and gradually adding fiber foods into diet.  Will mail handout on constipation Discussed Heart Healthy diet and will mail handouts from [facility] per patient request.   Encouraged weight maintenance.      MONITORING, EVALUATION, GOAL: weight trends, intake   NEXT VISIT: no follow-up planned RD available as needed  Soniya Ashraf B. Zenia Resides, [place], [place] Registered Dietitian [phone] (mobile)

## 2020-09-28 NOTE — Telephone Encounter (Signed)
Spoke with pt and advised Dr Burt Knack and his RN are out of the office today but will forward information to them for review.  Pt verbalizes understanding and agrees with current plan.

## 2020-09-28 NOTE — Telephone Encounter (Signed)
Patient called to advise Dr Sondra Come of emergency room visit this weekend for Urinary retention and constipation.  Advised I would let Dr Sondra Come know.  He just completed Radiation SBRT 3 fx to lung on 09/22/2020.  Advised these issues should not be related to his radiation but that I would make Dr. Sondra Come aware.    Confirmed that he has a one month follow up scheduled already.

## 2020-09-28 NOTE — Telephone Encounter (Signed)
Reviewed notes from hospital. There don't appear to be any acute cardiac problems. Would plan for scheduled follow-up unless new cardiac complaints present. thanks

## 2020-09-28 NOTE — Telephone Encounter (Signed)
New Message:      Pt was seen in [facility] ER on 09-26-20. He was told to let  Dr Burt Knack know about this visit and have DR Burt Knack review notes in Epic.

## 2020-09-29 ENCOUNTER — Ambulatory Visit: Payer: Medicare Other | Admitting: Radiation Oncology

## 2020-09-29 NOTE — Telephone Encounter (Signed)
Informed patient Dr. Burt Knack reviewed notes and no acute cardiac issues are apparent. Reiterated to him to keep appointment as scheduled and he will call if cardiac issues arise prior to that visit. He was grateful for call and agrees with plan.

## 2020-10-01 LAB — CULTURE, BLOOD (ROUTINE X 2)
Culture: NO GROWTH
Culture: NO GROWTH
Special Requests: ADEQUATE
Special Requests: ADEQUATE

## 2020-10-21 ENCOUNTER — Encounter: Payer: Self-pay | Admitting: *Deleted

## 2020-10-24 NOTE — Progress Notes (Signed)
Radiation Oncology         [phone] ________________________________  Name: [name] MRN: [id number]  Date: 10/25/2020  DOB: Feb 01, 1951  Follow-Up Visit Note  CC: [name], DO  Curt Bears, MD    ICD-10-CM   1. Malignant neoplasm of upper lobe of left lung ([facility])  C34.12   2. 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 Since the end of treatment, he was hospitalized from 09/25/2020 to 09/26/2020 for fecal impaction/constipation, which resolved.  On review of systems, he reports some residual fatigue since completion of his treatment. He denies significant cough or breathing issues..              ALLERGIES:  is allergic to iohexol, morphine and related, and other.  Meds: Current Outpatient Medications  Medication Sig Dispense Refill  . ACCU-CHEK AVIVA PLUS test strip 1 each daily.    Marland Kitchen amLODipine (NORVASC) 5 MG tablet Take 1 tablet (5 mg total) by mouth daily. 90 tablet 3  . aspirin EC 81 MG tablet Take 81 mg by mouth daily. Swallow whole.    Marland Kitchen atorvastatin (LIPITOR) 40 MG tablet Take 40 mg by mouth daily.    . cetirizine (ZYRTEC) 10 MG chewable tablet Chew 10 mg by mouth daily as needed for allergies.    Marland Kitchen clopidogrel (PLAVIX) 75 MG tablet TAKE 1 TABLET (75 MG TOTAL) BY MOUTH DAILY. 90 tablet 2  . Cyanocobalamin (VITAMIN B 12 PO) Take by mouth. Taking daily    . Empagliflozin (JARDIANCE PO) Take 25 mg by mouth daily.    Marland Kitchen erlotinib (TARCEVA) 150 MG tablet Take 1 tablet (150 mg total) by mouth daily. Take on an empty stomach 1 hour before meals or 2  hours after. 30 tablet 2  . ferrous sulfate 324 MG TBEC Take 324 mg by mouth.    Marland Kitchen glimepiride (AMARYL) 4 MG tablet Take 4 mg by mouth daily.    . isosorbide mononitrate (IMDUR) 60 MG 24 hr tablet Take 1 tablet (60 mg total) by mouth daily. 90 tablet 3  . Lancets (FREESTYLE) lancets 1 each daily.    Marland Kitchen losartan (COZAAR) 25 MG tablet Take 25 mg by mouth daily.    . metoprolol (LOPRESSOR) 100 MG tablet Take 100 mg by mouth daily. Take one tablet by mouth daily with or following a meal  4  . nitroGLYCERIN (NITROSTAT) 0.4 MG SL tablet Place 1 tablet (0.4 mg total) under the tongue every 5 (five) minutes as needed for chest pain (up to 3 doses). 25 tablet 3  . omeprazole (PRILOSEC) 20 MG capsule Take 20 mg by mouth 2 (two) times daily.  5  . polyethylene glycol (MIRALAX / GLYCOLAX) 17 g packet Take 17 g by mouth daily. Temporarily hold if you have diarrhea. 30 each 0  . senna (SENOKOT) 8.6 MG TABS tablet Take 1 tablet (8.6 mg total) by mouth at bedtime. Temporarily hold if you have diarrhea. 30 tablet 0  . TRADJENTA 5 MG TABS tablet Take 5 mg by mouth daily.    . zaleplon (SONATA) 10 MG capsule Take 10 mg by  mouth at bedtime.  5   No current facility-administered medications for this encounter.    Physical Findings: The patient is in no acute distress. Patient is alert and oriented.  height is 6' 1" (1.854 m) and weight is 185 lb 8 oz (84.1 kg). His temporal temperature is 96.9 F (36.1 C) (abnormal). His blood pressure is 123/66 and his pulse is 78. His respiration is 20 and oxygen saturation is 98%.   Lungs are clear to auscultation bilaterally. Heart has regular rate and rhythm. No palpable cervical, supraclavicular, or axillary adenopathy. Abdomen soft, non-tender, normal bowel sounds.   Lab Findings: Lab Results  Component Value Date   WBC 11.3 (H) 09/26/2020   HGB 15.2 09/26/2020   HCT 43.3 09/26/2020   MCV 89.1 09/26/2020   PLT 178 09/26/2020    Radiographic Findings: DG Chest 1  View  Result Date: 09/26/2020 CLINICAL DATA:  Urinary retention.  History of lung cancer. EXAM: CHEST  1 VIEW COMPARISON:  12/05/2017 FINDINGS: Heart size is normal. Coronary artery stent in place. Aortic atherosclerotic calcification is noted. The left lung is clear. Chronic deformity of the right posterior seventh and eighth ribs presumably related to previous chest surgery. Right lung appears clear. No heart failure or effusion. Two surgical clips in the upper medial left chest. IMPRESSION: No active disease. Coronary artery stent. Aortic atherosclerotic calcification. Chronic deformity of the right posterior seventh and eighth ribs. Electronically Signed   By: Nelson Chimes M.D.   On: 09/26/2020 00:54   DG Abd Portable 1V  Result Date: 09/26/2020 CLINICAL DATA:  Constipation. EXAM: PORTABLE ABDOMEN - 1 VIEW COMPARISON:  CT abdomen and pelvis September 25, 2020 FINDINGS: There is fairly mild stool volume in the colon. There is no bowel dilatation or air-fluid level to suggest bowel obstruction. No free air. Lung bases are clear. No abnormal calcifications. IMPRESSION: Fairly mild stool volume in colon. No bowel obstruction or free air evident. Lung bases clear. Electronically Signed   By: Lowella Grip III M.D.   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  He has some mild fatigue at this point but no other issues.  Plan: The patient is scheduled to follow up with Dr. Julien Nordmann on 01/11/2021.  He will follow up with radiation oncology in as needed basis in light of his close follow-up with medical oncology.  The patient will have a scan in late May of this year.   ____________________________________   Blair Promise, PhD, MD  This document serves as a record of services personally performed by Gery Pray, MD. It was created on his behalf by Clerance Lav, a  trained medical scribe. The creation of this record is based on the scribe's personal observations and the provider's statements to them. This document has been checked and approved by the attending provider.

## 2020-10-25 ENCOUNTER — Other Ambulatory Visit: Payer: Self-pay

## 2020-10-25 ENCOUNTER — Encounter: Payer: Self-pay | Admitting: Radiation Oncology

## 2020-10-25 ENCOUNTER — Ambulatory Visit
Admission: RE | Admit: 2020-10-25 | Discharge: 2020-10-25 | Disposition: A | Discharge status: Home / Self Care | Payer: Medicare Other | Source: Ambulatory Visit | Attending: Radiation Oncology | Admitting: Radiation Oncology

## 2020-10-25 VITALS — BP 123/66 | HR 78 | Temp 96.9°F | Resp 20 | Ht 73.0 in | Wt 185.5 lb

## 2020-10-25 DIAGNOSIS — Z923 Personal history of irradiation: Secondary | ICD-10-CM | POA: Insufficient documentation

## 2020-10-25 DIAGNOSIS — C3431 Malignant neoplasm of lower lobe, right bronchus or lung: Secondary | ICD-10-CM | POA: Insufficient documentation

## 2020-10-25 DIAGNOSIS — Z79899 Other long term (current) drug therapy: Secondary | ICD-10-CM | POA: Diagnosis not present

## 2020-10-25 DIAGNOSIS — C3491 Malignant neoplasm of unspecified part of right bronchus or lung: Secondary | ICD-10-CM

## 2020-10-25 DIAGNOSIS — Z7982 Long term (current) use of aspirin: Secondary | ICD-10-CM | POA: Insufficient documentation

## 2020-10-25 DIAGNOSIS — C3412 Malignant neoplasm of upper lobe, left bronchus or lung: Secondary | ICD-10-CM

## 2020-10-25 DIAGNOSIS — I700 Atherosclerosis of aorta: Secondary | ICD-10-CM | POA: Diagnosis not present

## 2020-10-25 DIAGNOSIS — Z7984 Long term (current) use of oral hypoglycemic drugs: Secondary | ICD-10-CM | POA: Diagnosis not present

## 2020-10-25 DIAGNOSIS — R5383 Other fatigue: Secondary | ICD-10-CM | POA: Insufficient documentation

## 2020-10-25 NOTE — Progress Notes (Incomplete)
  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 He did report mild fatigue, poor appetite, and itching of the skin. He denied shortness of breath, difficulty swallowing, and cough.  Plan: The patient will follow-up with radiation oncology in one month.  ________________________________________________   Blair Promise, PhD, MD  This document serves as a record of services personally performed by Gery Pray, MD. It was created on his behalf by Clerance Lav, a trained medical scribe. The creation of this record is based on the scribe's personal observations and the provider's statements to them. This document has been checked and approved by the attending provider.

## 2020-10-25 NOTE — Progress Notes (Signed)
Patient is here today for follow up post radiation.  Denies pain, shortness of breath.  Does report occasional cough.  Reports he did have fatigue during treatment that hasn't fully resolved as of yet.  Reports poor appetite.  Reports some choking while eating.  He states that it isn't always.  He reports taking Prilosec that helps some for that.  Patient was hospitalized for intestinal blockage after radiation.  Reports bowels are moving like normal today. Denies nausea/vomiting.  Vitals:   10/25/20 1615  BP: 123/66  Pulse: 78  Resp: 20  Temp: (!) 96.9 F (36.1 C)  TempSrc: Temporal  SpO2: 98%  Weight: 185 lb 8 oz (84.1 kg)  Height: 6\' 1"  (1.854 m)

## 2020-12-17 ENCOUNTER — Telehealth: Payer: Self-pay

## 2020-12-17 ENCOUNTER — Other Ambulatory Visit: Payer: Self-pay | Admitting: Internal Medicine

## 2020-12-17 DIAGNOSIS — C3490 Malignant neoplasm of unspecified part of unspecified bronchus or lung: Secondary | ICD-10-CM

## 2020-12-17 MED ORDER — ERLOTINIB HCL 150 MG PO TABS: 150.0000 mg | ORAL_TABLET | Freq: Every day | ORAL | 2 refills | Status: DC

## 2020-12-17 NOTE — Telephone Encounter (Signed)
Notification received from After Hours Nurse advising [facility] called to refill authorization from [facility].  Pts 6 month follow-up appt is 01/11/21.

## 2020-12-23 ENCOUNTER — Other Ambulatory Visit: Payer: Self-pay | Admitting: Medical Oncology

## 2021-01-07 ENCOUNTER — Other Ambulatory Visit: Payer: Self-pay

## 2021-01-07 ENCOUNTER — Inpatient Hospital Stay: Payer: Medicare Other | Attending: Internal Medicine

## 2021-01-07 ENCOUNTER — Ambulatory Visit (HOSPITAL_COMMUNITY)
Admission: RE | Admit: 2021-01-07 | Discharge: 2021-01-07 | Disposition: A | Discharge status: Home / Self Care | Payer: Medicare Other | Source: Ambulatory Visit | Attending: Internal Medicine | Admitting: Internal Medicine

## 2021-01-07 DIAGNOSIS — C3490 Malignant neoplasm of unspecified part of unspecified bronchus or lung: Secondary | ICD-10-CM

## 2021-01-07 DIAGNOSIS — C3412 Malignant neoplasm of upper lobe, left bronchus or lung: Secondary | ICD-10-CM | POA: Insufficient documentation

## 2021-01-07 DIAGNOSIS — R5383 Other fatigue: Secondary | ICD-10-CM | POA: Insufficient documentation

## 2021-01-07 DIAGNOSIS — H109 Unspecified conjunctivitis: Secondary | ICD-10-CM | POA: Insufficient documentation

## 2021-01-07 DIAGNOSIS — Z79899 Other long term (current) drug therapy: Secondary | ICD-10-CM | POA: Diagnosis not present

## 2021-01-07 DIAGNOSIS — R21 Rash and other nonspecific skin eruption: Secondary | ICD-10-CM | POA: Diagnosis not present

## 2021-01-07 DIAGNOSIS — R1310 Dysphagia, unspecified: Secondary | ICD-10-CM | POA: Diagnosis not present

## 2021-01-07 LAB — CBC WITH DIFFERENTIAL (CANCER CENTER ONLY)
Abs Immature Granulocytes: 0.04 10*3/uL (ref 0.00–0.07)
Basophils Absolute: 0.1 10*3/uL (ref 0.0–0.1)
Basophils Relative: 1 %
Eosinophils Absolute: 0.3 10*3/uL (ref 0.0–0.5)
Eosinophils Relative: 3 %
HCT: 46.7 % (ref 39.0–52.0)
Hemoglobin: 16.8 g/dL (ref 13.0–17.0)
Immature Granulocytes: 0 %
Lymphocytes Relative: 15 %
Lymphs Abs: 1.7 10*3/uL (ref 0.7–4.0)
MCH: 33.3 pg (ref 26.0–34.0)
MCHC: 36 g/dL (ref 30.0–36.0)
MCV: 92.7 fL (ref 80.0–100.0)
Monocytes Absolute: 1 10*3/uL (ref 0.1–1.0)
Monocytes Relative: 9 %
Neutro Abs: 8.3 10*3/uL — ABNORMAL HIGH (ref 1.7–7.7)
Neutrophils Relative %: 72 %
Platelet Count: 202 10*3/uL (ref 150–400)
RBC: 5.04 MIL/uL (ref 4.22–5.81)
RDW: 13.6 % (ref 11.5–15.5)
WBC Count: 11.3 10*3/uL — ABNORMAL HIGH (ref 4.0–10.5)
nRBC: 0 % (ref 0.0–0.2)

## 2021-01-07 LAB — CMP (CANCER CENTER ONLY)
ALT: 30 U/L (ref 0–44)
AST: 22 U/L (ref 15–41)
Albumin: 3.7 g/dL (ref 3.5–5.0)
Alkaline Phosphatase: 100 U/L (ref 38–126)
Anion gap: 10 (ref 5–15)
BUN: 16 mg/dL (ref 8–23)
CO2: 22 mmol/L (ref 22–32)
Calcium: 9.3 mg/dL (ref 8.9–10.3)
Chloride: 103 mmol/L (ref 98–111)
Creatinine: 1.84 mg/dL — ABNORMAL HIGH (ref 0.61–1.24)
GFR, Estimated: 39 mL/min — ABNORMAL LOW (ref >60)
Glucose, Bld: 198 mg/dL — ABNORMAL HIGH (ref 70–99)
Potassium: 4.6 mmol/L (ref 3.5–5.1)
Sodium: 135 mmol/L (ref 135–145)
Total Bilirubin: 1.5 mg/dL — ABNORMAL HIGH (ref 0.3–1.2)
Total Protein: 7 g/dL (ref 6.5–8.1)

## 2021-01-11 ENCOUNTER — Other Ambulatory Visit: Payer: Self-pay

## 2021-01-11 ENCOUNTER — Inpatient Hospital Stay: Payer: Medicare Other | Admitting: Internal Medicine

## 2021-01-11 ENCOUNTER — Encounter: Payer: Self-pay | Admitting: Internal Medicine

## 2021-01-11 VITALS — BP 111/63 | HR 84 | Temp 97.6°F | Resp 19 | Ht 73.0 in | Wt 179.4 lb

## 2021-01-11 DIAGNOSIS — C3412 Malignant neoplasm of upper lobe, left bronchus or lung: Secondary | ICD-10-CM | POA: Diagnosis not present

## 2021-01-11 MED ORDER — CLINDAMYCIN PHOSPHATE 1 % EX SOLN: Freq: Two times a day (BID) | CUTANEOUS | 0 refills | Status: DC

## 2021-01-11 MED ORDER — METHYLPREDNISOLONE 4 MG PO TBPK: ORAL_TABLET | ORAL | 0 refills | Status: DC

## 2021-01-11 NOTE — Addendum Note (Signed)
Addended by: Ardeen Garland on: 01/11/2021 04:16 PM   Modules accepted: Orders

## 2021-01-11 NOTE — Progress Notes (Signed)
[facility] [phone]   [phone]  OFFICE PROGRESS NOTE  [name] Margarita, DO [address] [place] [address]  DIAGNOSIS: Metastatic non-small cell lung cancer, adenocarcinoma initially diagnosed in December of 2004  PRIOR THERAPY: 1) status post course of concurrent chemoradiation under the care of Dr. Truddie [name]. 2) status post left upper lobe wedge resection on 01/10/2005 for recurrent disease in the left lung.  CURRENT THERAPY: Erlotinib 150 mg by mouth daily started in 2006.  INTERVAL HISTORY:  [name] 70 y.o. male returns to the clinic today for follow-up visit accompanied by his wife.  The patient has been complaining of increasing fatigue and weakness as well as significant conjunctivitis and skin rash that has been getting worse since he switched from the brand-name Tarceva to the generic erlotinib.  He also lost more than 30 pounds in the last several months.  He is scheduled to see his gastroenterologist Dr. Henrene [name] for upper endoscopy and evaluation of his upper dysphagia and weight loss.  He has no current nausea, vomiting, diarrhea or constipation.  He denied having any chest pain, shortness of breath, cough or hemoptysis.  He has no fever or chills.  He had repeat CT scan of the chest performed recently and is here for evaluation and discussion of his scan results.  MEDICAL HISTORY: Past Medical History:  Diagnosis Date  . AAA (abdominal aortic aneurysm) (HCC)    a. 3.4 x 3.5 on scan 02/28/13.  . Allergic reaction to contrast dye   . Atherosclerosis   . CAD (coronary artery disease)    a. stenting of RCA/LCx 2004. b. [place] 08/2013:  s/p DES to LCx for severe ISR.  . Diabetes mellitus   . Diverticulosis   . Emphysema lung ([facility])   . Emphysema of lung ([facility])   . Encounter for therapeutic drug monitoring 06/06/2016  . Hemorrhoids   . Hepatic steatosis   . History of radiation therapy 09/07/20-09/22/20   SBRT left lung ; Dr  [name]  . HTN (hypertension)   . Hyperlipidemia   . Kidney stone   . Lung cancer ([facility]) dx'd 2004   chemo/xrt comp; tarceva ongoing    ALLERGIES:  is allergic to iohexol, betadine [povidone iodine], morphine and related, and other.  MEDICATIONS:  Current Outpatient Medications  Medication Sig Dispense Refill  . ACCU-CHEK AVIVA PLUS test strip 1 each daily.    Marland Kitchen amLODipine (NORVASC) 5 MG tablet Take 1 tablet (5 mg total) by mouth daily. 90 tablet 3  . aspirin EC 81 MG tablet Take 81 mg by mouth daily. Swallow whole.    Marland Kitchen atorvastatin (LIPITOR) 40 MG tablet Take 40 mg by mouth daily.    . cetirizine (ZYRTEC) 10 MG chewable tablet Chew 10 mg by mouth daily as needed for allergies.    Marland Kitchen clopidogrel (PLAVIX) 75 MG tablet TAKE 1 TABLET (75 MG TOTAL) BY MOUTH DAILY. 90 tablet 2  . Cyanocobalamin (VITAMIN B 12 PO) Take by mouth. Taking daily    . Empagliflozin (JARDIANCE PO) Take 25 mg by mouth daily.    Marland Kitchen erlotinib (TARCEVA) 150 MG tablet Take 1 tablet (150 mg total) by mouth daily. Take on an empty stomach 1 hour before meals or 2 hours after. 30 tablet 2  . ferrous sulfate 324 MG TBEC Take 324 mg by mouth.    Marland Kitchen glimepiride (AMARYL) 4 MG tablet Take 4 mg by mouth daily.    . isosorbide mononitrate (  IMDUR) 60 MG 24 hr tablet Take 1 tablet (60 mg total) by mouth daily. 90 tablet 3  . Lancets (FREESTYLE) lancets 1 each daily.    Marland Kitchen losartan (COZAAR) 25 MG tablet Take 25 mg by mouth daily.    . metoprolol (LOPRESSOR) 100 MG tablet Take 100 mg by mouth daily. Take one tablet by mouth daily with or following a meal  4  . nitroGLYCERIN (NITROSTAT) 0.4 MG SL tablet Place 1 tablet (0.4 mg total) under the tongue every 5 (five) minutes as needed for chest pain (up to 3 doses). 25 tablet 3  . omeprazole (PRILOSEC) 20 MG capsule Take 20 mg by mouth 2 (two) times daily.  5  . polyethylene glycol (MIRALAX / GLYCOLAX) 17 g packet Take 17 g by mouth daily. Temporarily hold if you have diarrhea. 30 each 0   . senna (SENOKOT) 8.6 MG TABS tablet Take 1 tablet (8.6 mg total) by mouth at bedtime. Temporarily hold if you have diarrhea. 30 tablet 0  . TRADJENTA 5 MG TABS tablet Take 5 mg by mouth daily.    . zaleplon (SONATA) 10 MG capsule Take 10 mg by mouth at bedtime.  5   No current facility-administered medications for this visit.    REVIEW OF SYSTEMS:  Constitutional: positive for anorexia, fatigue and weight loss Eyes: positive for irritation and Conjunctivitis and redness Ears, nose, mouth, throat, and face: negative Respiratory: negative Cardiovascular: negative Gastrointestinal: positive for dysphagia Genitourinary:negative Integument/breast: positive for dryness and rash Hematologic/lymphatic: negative Musculoskeletal:negative Neurological: negative Behavioral/Psych: negative Endocrine: negative Allergic/Immunologic: negative   PHYSICAL EXAMINATION: General appearance: alert, cooperative, fatigued and no distress Head: Normocephalic, without obvious abnormality, atraumatic Neck: no adenopathy Lymph nodes: Cervical, supraclavicular, and axillary nodes normal. Resp: clear to auscultation bilaterally Back: symmetric, no curvature. ROM normal. No CVA tenderness. Cardio: regular rate and rhythm, S1, S2 normal, no murmur, click, rub or gallop GI: soft, non-tender; bowel sounds normal; no masses,  no organomegaly Extremities: extremities normal, atraumatic, no cyanosis or edema Neurologic: Alert and oriented X 3, normal strength and tone. Normal symmetric reflexes. Normal coordination and gait  Skin exam: Multiple papular rash all over the body.  ECOG PERFORMANCE STATUS: 1 - Symptomatic but completely ambulatory  Blood pressure 111/63, pulse 84, temperature 97.6 F (36.4 C), temperature source Tympanic, resp. rate 19, height $RemoveBe'6\' 1"'NPiKkfjEq$  (1.854 m), weight 179 lb 6.4 oz (81.4 kg), SpO2 97 %.  LABORATORY DATA: Lab Results  Component Value Date   WBC 11.3 (H) 01/07/2021   HGB 16.8  01/07/2021   HCT 46.7 01/07/2021   MCV 92.7 01/07/2021   PLT 202 01/07/2021      Chemistry      Component Value Date/Time   NA 135 01/07/2021 1214   NA 139 06/04/2017 0948   K 4.6 01/07/2021 1214   K 5.1 06/04/2017 0948   CL 103 01/07/2021 1214   CL 106 06/10/2012 0900   CO2 22 01/07/2021 1214   CO2 26 06/04/2017 0948   BUN 16 01/07/2021 1214   BUN 14.2 06/04/2017 0948   CREATININE 1.84 (H) 01/07/2021 1214   CREATININE 1.5 (H) 06/04/2017 0948      Component Value Date/Time   CALCIUM 9.3 01/07/2021 1214   CALCIUM 9.6 06/04/2017 0948   ALKPHOS 100 01/07/2021 1214   ALKPHOS 84 06/04/2017 0948   AST 22 01/07/2021 1214   AST 24 06/04/2017 0948   ALT 30 01/07/2021 1214   ALT 35 06/04/2017 0948   BILITOT 1.5 (H) 01/07/2021  1214   BILITOT 0.82 06/04/2017 0948       RADIOGRAPHIC STUDIES: CT Chest Wo Contrast  Result Date: 01/09/2021 CLINICAL DATA:  Non-small cell lung cancer restaging status post XRT February 2022 EXAM: CT CHEST WITHOUT CONTRAST TECHNIQUE: Multidetector CT imaging of the chest was performed following the standard protocol without IV contrast. COMPARISON:  CT chest, 06/18/2020, PET-CT, 07/05/2020 FINDINGS: Cardiovascular: Aortic atherosclerosis. Normal heart size. Three-vessel coronary artery calcifications. No pericardial effusion. Mediastinum/Nodes: No enlarged mediastinal, hilar, or axillary lymph nodes. Thyroid gland, trachea, and esophagus demonstrate no significant findings. Lungs/Pleura: Moderate centrilobular emphysema. Status post wedge resection of the posterior left upper lobe. There is new heterogeneous and ground-glass opacity about a previously noted nodule of the right lower lobe, the underlying nodule is difficult to distinctly appreciate (series 5, image 94). No pleural effusion or pneumothorax. Upper Abdomen: No acute abnormality. Small bilateral nonobstructive renal calculi. Musculoskeletal: No chest wall mass or suspicious bone lesions identified.  IMPRESSION: 1. There is new heterogeneous and ground-glass opacity about a previously noted nodule of the right lower lobe, the underlying nodule difficult to distinctly appreciate. 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 Attention on follow-up. 2. Status post wedge resection of the posterior left upper lobe. 3. Emphysema. 4. Coronary artery disease. Aortic Atherosclerosis (ICD10-I70.0) and Emphysema (ICD10-J43.9). Electronically Signed   By: Eddie Candle M.D.   On: 01/09/2021 12:05    ASSESSMENT/PLAN:  This is a very pleasant 70 years old white male with metastatic non-small cell lung cancer, adenocarcinoma with positive EGFR mutation and he is currently on treatment with Tarceva since 2006.  This was a switch of 8 months ago to the generic Erlotinib and he is tolerating it well except for few episodes of diarrhea and mild skin rash.  Since the switch to the generic Erlotinib, the patient has been complaining of worsening rash conjunctivitis as well as diarrhea that already improved. He also underwent SBRT to the enlarging left upper lobe lung nodule. I recommended for the patient to finish the current doses of Erlotinib 150 mg every other day and once he complete the remaining doses at home, we will switch his treatment to 100 mg p.o. daily. For the rash and conjunctivitis, I will start the patient on Medrol Dosepak and I also gave him prescription for clindamycin lotion to apply to the skin area affected by the rash. For the dysphagia, he is scheduled to see gastroenterology for evaluation and upper endoscopy. For the conjunctivitis, he is also scheduled to see his ophthalmologist later today for additional recommendation. The patient will come back for follow-up visit in 3 months for evaluation and repeat blood work. He was advised to call immediately if he has any other concerning symptoms in the interval. All questions were answered. The patient knows  to call the clinic with any problems, questions or concerns. We can certainly see the patient much sooner if necessary.  Disclaimer: This note was dictated with voice recognition software. Similar sounding words can inadvertently be transcribed and may not be corrected upon review.

## 2021-01-13 ENCOUNTER — Encounter: Payer: Self-pay | Admitting: Cardiovascular Disease

## 2021-01-13 ENCOUNTER — Ambulatory Visit: Payer: Medicare Other | Admitting: Cardiovascular Disease

## 2021-01-13 ENCOUNTER — Telehealth: Payer: Self-pay | Admitting: Internal Medicine

## 2021-01-13 ENCOUNTER — Other Ambulatory Visit: Payer: Self-pay

## 2021-01-13 VITALS — BP 110/58 | HR 69 | Ht 73.0 in | Wt 179.0 lb

## 2021-01-13 DIAGNOSIS — I10 Essential (primary) hypertension: Secondary | ICD-10-CM | POA: Diagnosis not present

## 2021-01-13 DIAGNOSIS — I259 Chronic ischemic heart disease, unspecified: Secondary | ICD-10-CM

## 2021-01-13 DIAGNOSIS — E782 Mixed hyperlipidemia: Secondary | ICD-10-CM

## 2021-01-13 MED ORDER — METOPROLOL TARTRATE 50 MG PO TABS: 50.0000 mg | ORAL_TABLET | Freq: Two times a day (BID) | ORAL | 3 refills | Status: DC

## 2021-01-13 NOTE — Patient Instructions (Addendum)
Medication Instructions:  1) STOP AMLODIPINE 2) START TAKING METOPROLOL 50 mg twice daily *If you need a refill on your cardiac medications before your next appointment, please call your pharmacy*  Follow-Up: At [facility], you and your health needs are our priority.  As part of our continuing mission to provide you with exceptional heart care, we have created designated Provider Care Teams.  These Care Teams include your primary Cardiologist (physician) and Advanced Practice Providers (APPs -  Physician Assistants and Nurse Practitioners) who all work together to provide you with the care you need, when you need it. Your next appointment:   12 month(s) The format for your next appointment:   In Person Provider:   You may see Sherren Mocha, MD or one of the following Advanced Practice Providers on your designated Care Team:    Richardson Dopp, PA-C  [place], [place]   Check your BP 2 or so times a week and let us know if you are having any issues.

## 2021-01-13 NOTE — Progress Notes (Signed)
Cardiology Office Note:    Date:  01/13/2021   ID:  [name], DOB August 08, 1951, MRN [id number]  PCP:  [name], DO   [facility] Providers Cardiologist:  Sherren Mocha, MD     Referring MD: [name], DO   Chief Complaint  Patient presents with  . Coronary Artery Disease    History of Present Illness:    [name] is a 70 y.o. male with a hx of Coronary artery disease, presenting for follow-up of evaluation.  The patient had remote PCI of the right coronary artery left circumflex vessel in 2004 and repeat stenting of the left circumflex in 2015.  He has a history of metastatic lung cancer found in 2004.  Comorbid conditions include infrarenal abdominal aortic aneurysm, hypertension, mixed hyperlipidemia, diabetes, COPD.  Patient is here with his wife today.  He has had a difficult time with recurrence of his malignancy requiring radiation therapy and shift and chemotherapy.  He has lost 30 pounds since last year.  He has had no chest pain or pressure.  He has mild shortness of breath with no change in symptoms.  No edema, orthopnea, or PND.  Notes that his blood pressure has been lower and he has complained of fatigue and lightheadedness.  Past Medical History:  Diagnosis Date  . AAA (abdominal aortic aneurysm) (HCC)    a. 3.4 x 3.5 on scan 02/28/13.  . Allergic reaction to contrast dye   . Atherosclerosis   . CAD (coronary artery disease)    a. stenting of RCA/LCx 2004. b. [place] 08/2013:  s/p DES to LCx for severe ISR.  . Diabetes mellitus   . Diverticulosis   . Emphysema lung ([place])   . Emphysema of lung ([place])   . Encounter for therapeutic drug monitoring 06/06/2016  . Hemorrhoids   . Hepatic steatosis   . History of radiation therapy 09/07/20-09/22/20   SBRT left lung ; Dr Gery Pray  . HTN (hypertension)   . Hyperlipidemia   . Kidney stone   . Lung cancer ([place]) dx'd 2004   chemo/xrt comp; tarceva ongoing    Past Surgical History:  Procedure  Laterality Date  . ANGIOPLASTY / STENTING FEMORAL  2015   2004-groin  . LEFT HEART CATHETERIZATION WITH CORONARY ANGIOGRAM N/A 09/08/2013   Procedure: LEFT HEART CATHETERIZATION WITH CORONARY ANGIOGRAM;  Surgeon: Blane Ohara, MD;  Location: [facility] CATH LAB;  Service: Cardiovascular;  Laterality: N/A;  . left partial lobectomy    . WISDOM TOOTH EXTRACTION      Current Medications: Current Meds  Medication Sig  . ACCU-CHEK AVIVA PLUS test strip 1 each daily.  Marland Kitchen aspirin EC 81 MG tablet Take 81 mg by mouth daily. Swallow whole.  Marland Kitchen atorvastatin (LIPITOR) 40 MG tablet Take 40 mg by mouth daily.  . cetirizine (ZYRTEC) 10 MG chewable tablet Chew 10 mg by mouth daily as needed for allergies.  . clindamycin (CLEOCIN-T) 1 % external solution Apply topically 2 (two) times daily.  . clopidogrel (PLAVIX) 75 MG tablet TAKE 1 TABLET (75 MG TOTAL) BY MOUTH DAILY.  Marland Kitchen Cyanocobalamin (VITAMIN B 12 PO) Take by mouth. Taking daily  . DULoxetine (CYMBALTA) 30 MG capsule Take 30 mg by mouth every morning.  . Empagliflozin (JARDIANCE PO) Take 25 mg by mouth daily.  Marland Kitchen erlotinib (TARCEVA) 150 MG tablet Take 1 tablet (150 mg total) by mouth daily. Take on an empty stomach 1 hour before meals or 2 hours after.  . famotidine (  PEPCID) 20 MG tablet Take 30 mg by mouth at bedtime.  . ferrous sulfate 324 MG TBEC Take 324 mg by mouth.  . Lancets (FREESTYLE) lancets 1 each daily.  Marland Kitchen loratadine (CLARITIN) 10 MG tablet Take 10 mg by mouth daily.  Marland Kitchen losartan (COZAAR) 50 MG tablet Take 25 mg by mouth daily.  . methylPREDNISolone (MEDROL DOSEPAK) 4 MG TBPK tablet Use as instructed.  . nitroGLYCERIN (NITROSTAT) 0.4 MG SL tablet Place 1 tablet (0.4 mg total) under the tongue every 5 (five) minutes as needed for chest pain (up to 3 doses).  . polyethylene glycol (MIRALAX / GLYCOLAX) 17 g packet Take 17 g by mouth daily. Temporarily hold if you have diarrhea.  . TRADJENTA 5 MG TABS tablet Take 5 mg by mouth daily.  . zaleplon  (SONATA) 10 MG capsule Take 10 mg by mouth at bedtime.  . [DISCONTINUED] metoprolol (LOPRESSOR) 100 MG tablet Take 100 mg by mouth daily. Take one tablet by mouth daily with or following a meal     Allergies:   Iohexol, Betadine [povidone iodine], Morphine and related, and Other   Social History   Socioeconomic History  . Marital status: Married    Spouse name: Not on file  . Number of children: 1  . Years of education: Not on file  . Highest education level: Not on file  Occupational History  . Occupation: Retired-disability  Tobacco Use  . Smoking status: Former Smoker    Quit date: 08/14/2002    Years since quitting: 18.4  . Smokeless tobacco: Never Used  Vaping Use  . Vaping Use: Never used  Substance and Sexual Activity  . Alcohol use: No  . Drug use: No  . Sexual activity: Not on file  Other Topics Concern  . Not on file  Social History Narrative  . Not on file   Social Determinants of Health   Financial Resource Strain: Not on file  Food Insecurity: Not on file  Transportation Needs: Not on file  Physical Activity: Not on file  Stress: Not on file  Social Connections: Not on file     Family History: The patient's family history includes Diabetes in his sister; Heart disease in his sister; Ovarian cancer in his sister. There is no history of Colon cancer, Esophageal cancer, Rectal cancer, or Stomach cancer.  ROS:   Please see the history of present illness.    All other systems reviewed and are negative.  EKGs/Labs/Other Studies Reviewed:    The following studies were reviewed today: Myoview 07/14/2020:  Nuclear stress EF: 46%. Mild generalized hypokinesis.  There was no ST segment deviation noted during stress.  There is mild reduced uptake noted along the inferior wall distribution seen at both rest and partially at stress. No large territory ischemia identified.  This is an intermediate risk study based upon mildly reduced ejection fraction of  46%.    EKG:  EKG is not ordered today.   Recent Labs: 09/26/2020: Magnesium 1.9 01/07/2021: ALT 30; BUN 16; Creatinine 1.84; Hemoglobin 16.8; Platelet Count 202; Potassium 4.6; Sodium 135  Recent Lipid Panel No results found for: CHOL, TRIG, HDL, CHOLHDL, VLDL, LDLCALC, LDLDIRECT   Risk Assessment/Calculations:       Physical Exam:    VS:  BP (!) 110/58   Pulse 69   Ht 6\' 1"  (1.854 m)   Wt 179 lb (81.2 kg)   SpO2 96%   BMI 23.62 kg/m     Wt Readings from Last 3 Encounters:  01/13/21 179 lb (81.2 kg)  01/11/21 179 lb 6.4 oz (81.4 kg)  10/25/20 185 lb 8 oz (84.1 kg)     GEN:  Well nourished, well developed in no acute distress HEENT: Normal NECK: No JVD; No carotid bruits LYMPHATICS: No lymphadenopathy CARDIAC: RRR, no murmurs, rubs, gallops RESPIRATORY:  Clear to auscultation without rales, wheezing or rhonchi  ABDOMEN: Soft, non-tender, non-distended MUSCULOSKELETAL:  No edema; No deformity  SKIN: Warm and dry NEUROLOGIC:  Alert and oriented x 3 PSYCHIATRIC:  Normal affect   ASSESSMENT:    1. Ischemic heart disease   2. Essential hypertension   3. Mixed hyperlipidemia    PLAN:    In order of problems listed above:  1. The patient is clinically stable with no angina on his current medical treatment.  He is taking metoprolol, amlodipine, and isosorbide for antianginal therapy.  He remains on dual antiplatelet therapy in the context of multiple first generation drug-eluting stents. 2. Blood pressure is too low and I suspect he has symptomatic hypotension in the setting of significant weight loss on multiple antihypertensive medicines.  He is going to split his metoprolol tartrate into twice daily dosing at a dose of 50 mg twice daily.  I asked him to discontinue amlodipine.  He will remain on losartan at his current low-dose.  He will check blood pressure a few days per week and calling with readings if there are problems. 3. Treated with atorvastatin 40 mg daily.   Recent cholesterol 97, HDL 28, LDL 39, ALT 30.        Medication Adjustments/Labs and Tests Ordered: Current medicines are reviewed at length with the patient today.  Concerns regarding medicines are outlined above.  No orders of the defined types were placed in this encounter.  Meds ordered this encounter  Medications  . metoprolol tartrate (LOPRESSOR) 50 MG tablet    Sig: Take 1 tablet (50 mg total) by mouth 2 (two) times daily.    Dispense:  180 tablet    Refill:  3    Patient Instructions  Medication Instructions:  1) STOP AMLODIPINE 2) START TAKING METOPROLOL 50 mg twice daily *If you need a refill on your cardiac medications before your next appointment, please call your pharmacy*  Follow-Up: At [facility], you and your health needs are our priority.  As part of our continuing mission to provide you with exceptional heart care, we have created designated Provider Care Teams.  These Care Teams include your primary Cardiologist (physician) and Advanced Practice Providers (APPs -  Physician Assistants and Nurse Practitioners) who all work together to provide you with the care you need, when you need it. Your next appointment:   12 month(s) The format for your next appointment:   In Person Provider:   You may see Sherren Mocha, MD or one of the following Advanced Practice Providers on your designated Care Team:    Richardson Dopp, PA-C  [place], [place]   Check your BP 2 or so times a week and let us know if you are having any issues.    Signed, Sherren Mocha, MD  01/13/2021 2:29 PM    [place]

## 2021-01-13 NOTE — Telephone Encounter (Signed)
Scheduled per los. Called and left msg. Mailed printout  °

## 2021-01-19 ENCOUNTER — Telehealth: Payer: Self-pay | Admitting: Internal Medicine

## 2021-01-19 NOTE — Telephone Encounter (Signed)
Pt has some questions about his upcoming appt on 6/22. He did not want to disclose more information just asked to speak with Dr. Blanch Media nurse.

## 2021-01-19 NOTE — Telephone Encounter (Signed)
Spoke with pt and he wanted to know if his visit on 6/22 was for a procedure or an eval. DIscussed with him his visit is for an OV to evaluate and see what he needs. Pt verbalized understanding.

## 2021-02-01 ENCOUNTER — Telehealth: Payer: Self-pay | Admitting: Cardiovascular Disease

## 2021-02-01 NOTE — Telephone Encounter (Signed)
BP Results   119/74 117/80 101/73 125/72 165/69 125/69 116/67 107/67

## 2021-02-01 NOTE — Telephone Encounter (Signed)
Patient calling in blood pressure readings after changes in medications were made.  Metoprolol was changed from 100 mg daily to 50 mg twice daily.  Amlodipine was discontinued.  Spoke with the patient who states that he has been feeling good.  Advised that readings will be sent to Dr. Burt Knack and if any changes need to be made we will let him know otherwise he should continue current medications. Patient verbalized understnaidng.

## 2021-02-02 ENCOUNTER — Telehealth: Payer: Self-pay

## 2021-02-02 ENCOUNTER — Encounter: Payer: Self-pay | Admitting: Internal Medicine

## 2021-02-02 ENCOUNTER — Ambulatory Visit: Payer: Medicare Other | Admitting: Internal Medicine

## 2021-02-02 VITALS — BP 88/62 | HR 76 | Ht 73.0 in | Wt 181.0 lb

## 2021-02-02 DIAGNOSIS — R1310 Dysphagia, unspecified: Secondary | ICD-10-CM | POA: Diagnosis not present

## 2021-02-02 DIAGNOSIS — Z7902 Long term (current) use of antithrombotics/antiplatelets: Secondary | ICD-10-CM | POA: Diagnosis not present

## 2021-02-02 DIAGNOSIS — K219 Gastro-esophageal reflux disease without esophagitis: Secondary | ICD-10-CM

## 2021-02-02 DIAGNOSIS — R634 Abnormal weight loss: Secondary | ICD-10-CM

## 2021-02-02 NOTE — Telephone Encounter (Signed)
Request for surgical clearance:     Endoscopy Procedure  What type of surgery is being performed?   EGD  When is this surgery scheduled?     02/10/21  What type of clearance is required ?   Pharmacy  Are there any medications that need to be held prior to surgery and how long? Plavix x5 days   Practice name and name of physician performing surgery?      [facility]-Dr. Henrene Pastor   What is your office phone and fax number?      Phone- [phone]  [phone]  Anesthesia type (None, local, MAC, general) ?       MAC

## 2021-02-02 NOTE — Progress Notes (Signed)
HISTORY OF PRESENT ILLNESS:  [name] is a 70 y.o. male, [employer] fan, with MULTIPLE significant medical problems including but not limited to coronary artery disease status post coronary artery intervention 2004 and 2015 including stenting for which he is on Plavix, recurrent lung cancer which she underwent radiation therapy earlier this year, 30 pound weight loss since that time.  Other issues include emphysema, diabetes mellitus, and hypertension.  I saw the patient in 2016 for screening colonoscopy.  He was found to have sigmoid diverticulosis.  Repeat screening in 10 years recommended.  He presents today with chief complaint of dysphagia.  He is accompanied by his wife.  He tells me that he had a significant episode of dysphagia about 6 years ago with transient food impaction.  He was eating steak.  Since that time he has had intermittent solid food dysphagia.  At one point he was placed on omeprazole 20 mg daily.  This helped problems with pyrosis as well as dysphagia.  His oncologist told him to stop his PPI citing concerns over impairment of kidney function.  He was placed on famotidine.  Thereafter he reports significant problems with severe pyrosis and worsening dysphagia.  He is now back on omeprazole.  Review of blood work from Jan 07, 2021 shows a creatinine of 1.84.  CBC is unremarkable with hemoglobin 16.8.  Liver tests are unremarkable.  He did have CT scan of the abdomen and pelvis February 2022.  At that time fecal impaction.  No other acute abnormalities.  He required disimpaction.  Now taking MiraLAX daily with good results.  Chest CT with surgical change, radiation change, and emphysema changes noted.  He has come off Plavix previously for procedures.  With regards to his weight loss, he tells me that his appetites been poor.  REVIEW OF SYSTEMS:  All non-GI ROS negative unless otherwise stated in the HPI except for  Past Medical History:  Diagnosis Date   AAA  (abdominal aortic aneurysm) ([place])    a. 3.4 x 3.5 on scan 02/28/13.   Allergic reaction to contrast dye    Atherosclerosis    CAD (coronary artery disease)    a. stenting of RCA/LCx 2004. b. [place] 08/2013:  s/p DES to LCx for severe ISR.   Diabetes mellitus    Diverticulosis    Emphysema lung ([place])    Emphysema of lung ([place])    Encounter for therapeutic drug monitoring 06/06/2016   Hemorrhoids    Hepatic steatosis    History of radiation therapy 09/07/20-09/22/20   SBRT left lung ; Dr Gery Pray   HTN (hypertension)    Hyperlipidemia    Kidney stone    Lung cancer ([place]) dx'd 2004   chemo/xrt comp; tarceva ongoing    Past Surgical History:  Procedure Laterality Date   ANGIOPLASTY / STENTING FEMORAL  2015   2004-groin   LEFT HEART CATHETERIZATION WITH CORONARY ANGIOGRAM N/A 09/08/2013   Procedure: LEFT HEART CATHETERIZATION WITH CORONARY ANGIOGRAM;  Surgeon: Blane Ohara, MD;  Location: [facility] CATH LAB;  Service: Cardiovascular;  Laterality: N/A;   left partial lobectomy     WISDOM TOOTH EXTRACTION      Social History TADARRIUS BURCH [name]  reports that he quit smoking about 18 years ago. His smoking use included cigarettes. He has never used smokeless tobacco. He reports that he does not drink alcohol and does not use drugs.  family history includes Diabetes in his sister; Heart disease in his sister; Ovarian  cancer in his sister.  Allergies  Allergen Reactions   Iohexol Other (See Comments)     Code: HIVES, Desc: pt recieves 50mg  benadryl 1 hour prior to scan    Betadine [Povidone Iodine] Rash   Morphine And Related Anxiety    hallucinations   Other Rash    chlorascrub when starting IV       PHYSICAL EXAMINATION: Vital signs: Pulse 76   Ht 6\' 1"  (1.854 m)   Wt 181 lb (82.1 kg)   SpO2 98%   BMI 23.88 kg/m   Constitutional: Somewhat weak appearing but otherwise generally well-appearing, no acute distress Psychiatric: alert and oriented x3, cooperative Eyes:  extraocular movements intact, anicteric, conjunctiva pink Mouth: Mask Neck: supple no lymphadenopathy Cardiovascular: heart regular rate and rhythm, no murmur Lungs: clear to auscultation bilaterally with decreased breath sounds in the left upper lobe Abdomen: soft, nontender, nondistended, no obvious ascites, no peritoneal signs, normal bowel sounds, no organomegaly Rectal: Omitted Extremities: no clubbing, cyanosis, or lower extremity edema bilaterally Skin: no lesions on visible extremities Neuro: No focal deficits.  Cranial nerves intact  ASSESSMENT:  1.  GERD.  Significant symptoms off PPI 2.  Intermittent worsening solid food dysphagia.  Rule out peptic stricture.  Rule out neoplasm 3.  Weight loss.  Probably related to recurrent lung cancer and cancer treatment with associated decreased appetite. 4.  Multiple significant medical problems noted. 5.  On Plavix for history of coronary artery disease 6.  Colonoscopy 2016 with diverticulosis 7.  History of cecal impaction.  More regular bowel movements with MiraLAX  PLAN:  1.  Reflux precautions 2.  Continue omeprazole 20 mg daily 3.  Schedule upper endoscopy with probable esophageal dilation.  The patient is HIGH RISK given his comorbidities and the need to address chronic Plavix therapy.The nature of the procedure, as well as the risks, benefits, and alternatives were carefully and thoroughly reviewed with the patient. Ample time for discussion and questions allowed. The patient understood, was satisfied, and agreed to proceed.  4.  Hold Plavix 5 to 7 days prior to procedure.  We will check with his cardiologist to see if this is acceptable as it has been in the past 5.  Hold diabetic medications the day of the examination to avoid unwanted hypoglycemia 6.  Continue daily MiraLAX A total time of 65 minutes was spent preparing to see the patient, reviewing x-rays and laboratories, reviewing endoscopy report, obtaining comprehensive  history, reviewing outside office records from other consultants, performing medically appropriate physical examination, counseling patient and his wife regarding his multiple above listed issues, ordering medications and advanced endoscopic procedures, adjusting antiplatelet therapy in conjunction with his cardiologist, and documenting clinical information in the health record

## 2021-02-02 NOTE — Telephone Encounter (Signed)
Dr. Burt Knack, Patient has first generation DES, last intervention was 2015. Can he hold plavix for 5 days for EGD?

## 2021-02-02 NOTE — Patient Instructions (Addendum)
If you are age 71 or older, your body mass index should be between 23-30. Your Body mass index is 23.88 kg/m. If this is out of the aforementioned range listed, please consider follow up with your Primary Care Provider.  You will be contaced by our office prior to your procedure for directions on holding your Plavix.  If you do not hear from our office 1 week prior to your scheduled procedure, please call [phone] to discuss.    _  x_   ORAL DIABETIC MEDICATION INSTRUCTIONS [place], [place]  The day before your procedure:  Take your diabetic pill as you do normally  The day of your procedure:  Do not take your diabetic pill   We will check your blood sugar levels during the admission process and again in Recovery before discharging you home  ______________________________________________________________________       The [facility] providers would like to encourage you to use [facility] to communicate with providers for non-urgent requests or questions.  Due to long hold times on the telephone, sending your provider a message by [facility] may be a faster and more efficient way to get a response.  Please allow 48 business hours for a response.  Please remember that this is for non-urgent requests.   You have been scheduled for an endoscopy. Please follow written instructions given to you at your visit today. If you use inhalers (even only as needed), please bring them with you on the day of your procedure.  Due to recent changes in healthcare laws, you may see the results of your imaging and laboratory studies on MyChart before your provider has had a chance to review them.  We understand that in some cases there may be results that are confusing or concerning to you. Not all laboratory results come back in the same time frame and the provider may be waiting for multiple results in order to interpret others.  Please give [place] 48 hours in order for your provider to thoroughly review all  the results before contacting the office for clarification of your results.   Thank you for choosing me and [facility].  Dr.John Henrene Pastor

## 2021-02-04 ENCOUNTER — Telehealth: Payer: Self-pay | Admitting: Internal Medicine

## 2021-02-04 NOTE — Telephone Encounter (Signed)
Patient calling wants to know when to stop taking hs Plavix.. Plz advise..thanks

## 2021-02-07 NOTE — Telephone Encounter (Signed)
Readings look very good. Would continue same rx. thanks

## 2021-02-10 ENCOUNTER — Ambulatory Visit (AMBULATORY_SURGERY_CENTER): Payer: Medicare Other | Admitting: Internal Medicine

## 2021-02-10 ENCOUNTER — Other Ambulatory Visit: Payer: Self-pay

## 2021-02-10 ENCOUNTER — Encounter: Payer: Self-pay | Admitting: Internal Medicine

## 2021-02-10 VITALS — BP 98/62 | HR 68 | Temp 97.8°F | Resp 12 | Ht 73.0 in | Wt 181.0 lb

## 2021-02-10 DIAGNOSIS — K2980 Duodenitis without bleeding: Secondary | ICD-10-CM | POA: Diagnosis not present

## 2021-02-10 DIAGNOSIS — K222 Esophageal obstruction: Secondary | ICD-10-CM | POA: Diagnosis not present

## 2021-02-10 DIAGNOSIS — K449 Diaphragmatic hernia without obstruction or gangrene: Secondary | ICD-10-CM

## 2021-02-10 DIAGNOSIS — R1310 Dysphagia, unspecified: Secondary | ICD-10-CM

## 2021-02-10 DIAGNOSIS — K219 Gastro-esophageal reflux disease without esophagitis: Secondary | ICD-10-CM

## 2021-02-10 MED ORDER — SODIUM CHLORIDE 0.9 % IV SOLN: 500.0000 mL | Freq: Once | INTRAVENOUS | Status: DC

## 2021-02-10 MED ORDER — PANTOPRAZOLE SODIUM 40 MG PO TBEC: 40.0000 mg | DELAYED_RELEASE_TABLET | Freq: Every day | ORAL | 11 refills | Status: DC

## 2021-02-10 NOTE — Progress Notes (Signed)
VS- Courtney Washington 

## 2021-02-10 NOTE — Progress Notes (Signed)
PT taken to PACU. Monitors in place. VSS. Report given to RN. 

## 2021-02-10 NOTE — Patient Instructions (Signed)
YOU HAD AN ENDOSCOPIC PROCEDURE TODAY AT THE [facility]:   Refer to the procedure report that was given to you for any specific questions about what was found during the examination.  If the procedure report does not answer your questions, please call your gastroenterologist to clarify.  If you requested that your care partner not be given the details of your procedure findings, then the procedure report has been included in a sealed envelope for you to review at your convenience later.  YOU SHOULD EXPECT: Some feelings of bloating in the abdomen. Passage of more gas than usual.  Walking can help get rid of the air that was put into your GI tract during the procedure and reduce the bloating. If you had a lower endoscopy (such as a colonoscopy or flexible sigmoidoscopy) you may notice spotting of blood in your stool or on the toilet paper. If you underwent a bowel prep for your procedure, you may not have a normal bowel movement for a few days.  Please Note:  You might notice some irritation and congestion in your nose or some drainage.  This is from the oxygen used during your procedure.  There is no need for concern and it should clear up in a day or so.  SYMPTOMS TO REPORT IMMEDIATELY:   Following lower endoscopy (colonoscopy or flexible sigmoidoscopy):  Excessive amounts of blood in the stool  Significant tenderness or worsening of abdominal pains  Swelling of the abdomen that is new, acute  Fever of 100F or higher   Following upper endoscopy (EGD)  Vomiting of blood or coffee ground material  New chest pain or pain under the shoulder blades  Painful or persistently difficult swallowing  New shortness of breath  Fever of 100F or higher  Black, tarry-looking stools  For urgent or emergent issues, a gastroenterologist can be reached at any hour by calling [phone]. Do not use MyChart messaging for urgent concerns.    DIET:  We do recommend a small meal at first, but  then you may proceed to your regular diet.  Drink plenty of fluids but you should avoid alcoholic beverages for 24 hours.  ACTIVITY:  You should plan to take it easy for the rest of today and you should NOT DRIVE or use heavy machinery until tomorrow (because of the sedation medicines used during the test).    FOLLOW UP: Our staff will call the number listed on your records 48-72 hours following your procedure to check on you and address any questions or concerns that you may have regarding the information given to you following your procedure. If we do not reach you, we will leave a message.  We will attempt to reach you two times.  During this call, we will ask if you have developed any symptoms of COVID 19. If you develop any symptoms (ie: fever, flu-like symptoms, shortness of breath, cough etc.) before then, please call [phone].  If you test positive for Covid 19 in the 2 weeks post procedure, please call and report this information to us.    If any biopsies were taken you will be contacted by phone or by letter within the next 1-3 weeks.  Please call us at [phone] if you have not heard about the biopsies in 3 weeks.    SIGNATURES/CONFIDENTIALITY: You and/or your care partner have signed paperwork which will be entered into your electronic medical record.  These signatures attest to the fact that that the information above on   your After Visit Summary has been reviewed and is understood.  Full responsibility of the confidentiality of this discharge information lies with you and/or your care-partner. 

## 2021-02-10 NOTE — Op Note (Signed)
[place] Patient Name: [name] Procedure Date: 02/10/2021 3:29 PM MRN: [id number] Endoscopist: Docia Chuck. Henrene Pastor , MD Age: 70 Referring MD:  Date of Birth: 1951-05-03 Gender: Male Account #: [id number] Procedure:                Upper GI endoscopy with biopsies; Maloney dilation                            of the esophagus. 51 [place] Indications:              Dysphagia, Esophageal reflux, Weight loss Medicines:                Monitored Anesthesia Care Procedure:                Pre-Anesthesia Assessment:                           - Prior to the procedure, a History and Physical                            was performed, and patient medications and                            allergies were reviewed. The patient's tolerance of                            previous anesthesia was also reviewed. The risks                            and benefits of the procedure and the sedation                            options and risks were discussed with the patient.                            All questions were answered, and informed consent                            was obtained. Prior Anticoagulants: The patient has                            taken Plavix (clopidogrel), last dose was 6 days                            prior to procedure. ASA Grade Assessment: III - A                            patient with severe systemic disease. After                            reviewing the risks and benefits, the patient was                            deemed in satisfactory condition to undergo the  procedure.                           After obtaining informed consent, the endoscope was                            passed under direct vision. Throughout the                            procedure, the patient's blood pressure, pulse, and                            oxygen saturations were monitored continuously. The                            Endoscope was introduced through the  mouth, and                            advanced to the second part of duodenum. The upper                            GI endoscopy was accomplished without difficulty.                            The patient tolerated the procedure well. Scope In: Scope Out: Findings:                 One benign-appearing, intrinsic moderate stenosis                            was found 40 cm from the incisors. This stenosis                            measured 1.5 cm (inner diameter). The scope was                            withdrawn. Dilation was performed with a Maloney                            dilator with no resistance at 75 Fr.                           The exam of the esophagus revealed esophagitis as                            manifested by erythema and edema at the mucosal                            Z-line. No Barrett's esophagus.                           The stomach was normal, save small hiatal hernia                            and mild stenosis  of the pylorus. Biopsies were                            taken with a cold forceps for Helicobacter pylori                            testing using CLOtest.                           The examined duodenum revealed duodenitis involving                            the bulb. Post bulbar duodenum was normal.                           The cardia and gastric fundus were normal on                            retroflexion. Complications:            No immediate complications. Estimated Blood Loss:     Estimated blood loss: none. Impression:               1. GERD with esophagitis and stricture status post                            dilation                           2. Mild pyloric stenosis                           3. Duodenitis status post CLO biopsy. Recommendation:           1. Patient has a contact number available for                            emergencies. The signs and symptoms of potential                            delayed complications were discussed  with the                            patient. Return to normal activities tomorrow.                            Written discharge instructions were provided to the                            patient.                           2. Post dilation diet.                           3. Continue present medications.  4. Await pathology results.                           5. Resume Plavix (clopidogrel) at prior dose today.                           6. PRESCRIBE PANTOPRAZOLE 40 mg daily; #30; 11                            refills. Please take 1 every morning. This will                            help the inflammation of your esophagus and                            swallowing issues.                           7. Office follow-up with Dr. Henrene Pastor in about 6 weeks Docia Chuck. Henrene Pastor, MD 02/10/2021 4:03:08 PM This report has been signed electronically.

## 2021-02-10 NOTE — Progress Notes (Signed)
Called to room to assist during endoscopic procedure.  Patient ID and intended procedure confirmed with present staff. Received instructions for my participation in the procedure from the performing physician.  

## 2021-02-11 LAB — HELICOBACTER PYLORI SCREEN-BIOPSY: UREASE: NEGATIVE

## 2021-02-15 ENCOUNTER — Telehealth: Payer: Self-pay | Admitting: Pharmacist

## 2021-02-15 ENCOUNTER — Telehealth: Payer: Self-pay | Admitting: *Deleted

## 2021-02-15 NOTE — Telephone Encounter (Signed)
Have you developed a fever since your procedure? no  2.   Have you had an respiratory symptoms (SOB or cough) since your procedure? no  3.   Have you tested positive for COVID 19 since your procedure no  4.   Have you had any family members/close contacts diagnosed with the COVID 19 since your procedure?  no   If yes to any of these questions please route to Joylene John, RN and Joella Prince, RN  Follow up Call-  Call back number 02/10/2021  Post procedure Call Back phone  # [phone]  Permission to leave phone message Yes  Some recent data might be hidden     Patient questions:  Do you have a fever, pain , or abdominal swelling? No. Pain Score  0 *  Have you tolerated food without any problems? Yes.    Have you been able to return to your normal activities? Yes.    Do you have any questions about your discharge instructions: Diet   No. Medications  Yes.   Follow up visit  No.  Do you have questions or concerns about your Care? No.  Actions: * If pain score is 4 or above: No action needed, pain <4.      Pt's wife says pt has read that there is a contraindication taking Pantoprazole with Erlotinib ( pt's Cancer medication).Pt's wife says the Pantoprazole will not work if taken with Erlotinib Instructed pt to check with her pharmacist to verify this information and if there is a contraindication to notify Dr Henrene Pastor and or Dr Blanch Media nurse and that I would route this concern to Dr Henrene Pastor.

## 2021-02-15 NOTE — Telephone Encounter (Signed)
Spoke with pts wife and she is aware and will contact the oncologist.

## 2021-02-15 NOTE — Telephone Encounter (Signed)
Oral Chemotherapy Pharmacist Encounter   Spoke with patient today to follow up regarding patient's oral chemotherapy medication Tarceva (erlotinib) and drug-drug interaction with pantoprazole.  Discussed with patient and patient's wife that the concomitant use of pantoprazole while being on Tarceva is not recommended due to this combination leading to decreased concentration, thus efficacy of Tarceva. It is OK for patient to take Pepcid (famotidine), but this needs to be separated from [place] administration. Patient voiced understanding that Tarceva needs to be taken either 2 hours before or 10 hours after taking famotidine.   Patient knows to call the office with questions or concerns.  Leron Croak, PharmD, BCPS Hematology/Oncology Clinical Pharmacist [facility] [phone] 02/15/2021 12:28 PM

## 2021-02-16 NOTE — Telephone Encounter (Signed)
   Patient Name: [name]  DOB: 1951/06/24  MRN: [id number]   Primary Cardiologist: Sherren Mocha, MD  Chart reviewed as part of pre-operative protocol coverage.  Patient has already had procedure 02/10/21. Will remove from preop box.   Charlie Pitter, PA-C 02/16/2021, 8:22 AM

## 2021-03-10 ENCOUNTER — Telehealth: Payer: Self-pay

## 2021-03-10 NOTE — Telephone Encounter (Signed)
Message received from [place] w/[employer] requesting to know the d/c reason for pts Tarceva (Erlotinib).  I reviewed Dr. Ellan Lambert last progress note dated 01/11/21, which states "I recommended for the patient to finish the current doses of Erlotinib 150 mg every other day and once he complete the remaining doses at home, we will switch his treatment to 100 mg p.o. daily".  I have called [employer] back and spoke with Tyriq and advised of this. He stated he will document this change.

## 2021-03-23 ENCOUNTER — Telehealth: Payer: Self-pay | Admitting: Pharmacist

## 2021-03-23 NOTE — Telephone Encounter (Signed)
Oral Chemotherapy Pharmacist Encounter   Received staff message from Dr. Francesco Sor confirming that patient had been switched off of PPI and has now started Pepcid (famotidine) due to drug interaction with patient's Tarceva (erlotinib).   Followed up with [name] to discuss specific timing of taking his famotidine with his erlotinib. He states that he is currently taking famotidine twice daily. Discussed with patient a timed schedule to ensure that the days he is taking erlotinib (currently taking erlotinib 150 mg by mouth every other day) and famotidine are appropriately spaced apart so that the erlotinib is at least 2 hours before or 10 hours after famotidine.   Patient agreeable to taking erlotinib at 8AM, first dose of famotidine at 10AM, and second dose of famotidine at 10PM.   Patient knows to call the office with questions or concerns.  [name], PharmD, BCPS Hematology/Oncology Clinical Pharmacist [facility] [phone] 03/23/2021 10:02 AM

## 2021-03-25 ENCOUNTER — Telehealth: Payer: Self-pay | Admitting: Medical Oncology

## 2021-03-25 NOTE — Telephone Encounter (Signed)
Tarceva dose change   He has a week left of tarceva 150 mg tablets which he is taking every other day. Will need refill and new dose.   He understood Julien Nordmann is going to change his rx to  tarceva 100 mg when he finishes the 150  mg tablets .  Message sent to [facility].

## 2021-03-28 ENCOUNTER — Encounter: Payer: Self-pay | Admitting: Internal Medicine

## 2021-03-28 ENCOUNTER — Ambulatory Visit: Payer: Medicare Other | Admitting: Internal Medicine

## 2021-03-28 ENCOUNTER — Other Ambulatory Visit: Payer: Self-pay | Admitting: Medical Oncology

## 2021-03-28 ENCOUNTER — Other Ambulatory Visit: Payer: Self-pay | Admitting: Internal Medicine

## 2021-03-28 VITALS — BP 100/60 | HR 61 | Ht 71.0 in | Wt 181.2 lb

## 2021-03-28 DIAGNOSIS — R1310 Dysphagia, unspecified: Secondary | ICD-10-CM | POA: Diagnosis not present

## 2021-03-28 DIAGNOSIS — C3490 Malignant neoplasm of unspecified part of unspecified bronchus or lung: Secondary | ICD-10-CM

## 2021-03-28 DIAGNOSIS — K222 Esophageal obstruction: Secondary | ICD-10-CM

## 2021-03-28 DIAGNOSIS — K219 Gastro-esophageal reflux disease without esophagitis: Secondary | ICD-10-CM | POA: Diagnosis not present

## 2021-03-28 MED ORDER — ERLOTINIB HCL 100 MG PO TABS: 100.0000 mg | ORAL_TABLET | Freq: Every day | ORAL | 1 refills | Status: DC

## 2021-03-28 NOTE — Progress Notes (Signed)
HISTORY OF PRESENT ILLNESS:  [name] is a 70 y.o. male, [employer] fan, with multiple significant medical problems including coronary artery disease on Plavix, recurrent lung cancer status postradiation therapy on Tarceva, and chronic GERD.  He was seen in the office February 02, 2021 for chronic GERD.  Significant symptoms off PPI.  At that time he was complaining of dysphagia.  He underwent upper endoscopy with esophageal dilation February 10, 2021.  He was dilated to maximal diameter 54 [place].  Noted to have esophagitis.  Prescribe pantoprazole.  Did not start pantoprazole due to concerns over drug interaction with Tarceva.  Presents at this time.  He is taking famotidine 20 mg twice daily.  His oncologist that this was okay.  They are not favoring PPI, however.  Patient tells me that he had a significant improvement in his dysphagia.  However he will have some intermittent solid food dysphagia with bread or meat if he is not careful.  He does have some reflux symptoms despite H2 receptor antagonist therapy.  Last colonoscopy in 2016 was negative for neoplasia.  He kindly brings me a [employer] cap  REVIEW OF SYSTEMS:  All non-GI ROS negative unless otherwise stated in the HPI. Past Medical History:  Diagnosis Date   AAA (abdominal aortic aneurysm) ([place])    a. 3.4 x 3.5 on scan 02/28/13.   Allergic reaction to contrast dye    Atherosclerosis    CAD (coronary artery disease)    a. stenting of RCA/LCx 2004. b. [place] 08/2013:  s/p DES to LCx for severe ISR.   Diabetes mellitus    Diverticulosis    Emphysema lung ([place])    Emphysema of lung ([place])    Encounter for therapeutic drug monitoring 06/06/2016   GERD (gastroesophageal reflux disease)    Hemorrhoids    Hepatic steatosis    History of radiation therapy 09/07/20-09/22/20   SBRT left lung ; Dr Gery Pray   HTN (hypertension)    Hyperlipidemia    Kidney stone    Lung cancer [facility]) dx'd 2004   chemo/xrt comp;  tarceva ongoing- January 2022 radiation    Past Surgical History:  Procedure Laterality Date   ANGIOPLASTY / STENTING FEMORAL  08/14/2013   2004-groin   CATARACT EXTRACTION     COLONOSCOPY     LEFT HEART CATHETERIZATION WITH CORONARY ANGIOGRAM N/A 09/08/2013   Procedure: LEFT HEART CATHETERIZATION WITH CORONARY ANGIOGRAM;  Surgeon: Blane Ohara, MD;  Location: [facility] CATH LAB;  Service: Cardiovascular;  Laterality: N/A;   left partial lobectomy     WISDOM TOOTH EXTRACTION      Social History ROI JAFARI [name]  reports that he quit smoking about 18 years ago. His smoking use included cigarettes. He has never used smokeless tobacco. He reports that he does not drink alcohol and does not use drugs.  family history includes Diabetes in his sister; Heart disease in his sister; Ovarian cancer in his sister.  Allergies  Allergen Reactions   Iohexol Other (See Comments)     Code: HIVES, Desc: pt recieves 50mg  benadryl 1 hour prior to scan    Betadine [Povidone Iodine] Rash   Morphine And Related Anxiety    hallucinations   Other Rash    chlorascrub when starting IV       PHYSICAL EXAMINATION: Vital signs: BP 100/60   Pulse 61   Ht 5\' 11"  (1.803 m)   Wt 181 lb 4 oz (82.2 kg)   BMI 25.28  kg/m   Constitutional: generally well-appearing, no acute distress Psychiatric: alert and oriented x3, cooperative Eyes: Anicteric Mouth: Mask Abdomen: Not reexamined Skin: no lesions on visible extremities Neuro: Grossly intact  ASSESSMENT:  1.  GERD complicated by peptic stricture.  Improvement dysphagia post dilation.  Still some residual dysphagia.  Did not start PPI due to concerns over [place] interaction 2.  Colonoscopy 2016 with diverticulosis 3.  Multiple medical problems   PLAN:  1.  Reflux precautions 2.  Continue famotidine.  Okayed by his oncologist.  Would increase to 40 mg twice daily since he is having active symptoms.  I believe that esophagitis is responsible for his  improved but residual dysphagia 3.  Routine office follow-up 3 months.  Contact the office in the interim for any questions or problems

## 2021-03-28 NOTE — Patient Instructions (Signed)
We have sent the following medications to your pharmacy for you to pick up at your convenience:  Pepcid  Please follow up in three months

## 2021-03-28 NOTE — Progress Notes (Signed)
Tarceva refill by Dr Julien Nordmann.

## 2021-04-07 ENCOUNTER — Other Ambulatory Visit: Payer: Self-pay | Admitting: *Deleted

## 2021-04-07 DIAGNOSIS — I259 Chronic ischemic heart disease, unspecified: Secondary | ICD-10-CM

## 2021-04-07 MED ORDER — ISOSORBIDE MONONITRATE ER 60 MG PO TB24
60.0000 mg | ORAL_TABLET | Freq: Every day | ORAL | 3 refills | Status: DC
Start: 2021-04-07 — End: 2021-07-12

## 2021-04-13 ENCOUNTER — Inpatient Hospital Stay: Payer: Medicare Other | Attending: Internal Medicine | Admitting: Internal Medicine

## 2021-04-13 ENCOUNTER — Inpatient Hospital Stay: Payer: Medicare Other

## 2021-04-13 ENCOUNTER — Other Ambulatory Visit: Payer: Self-pay

## 2021-04-13 VITALS — BP 117/71 | HR 82 | Temp 97.8°F | Resp 20 | Ht 71.0 in | Wt 181.5 lb

## 2021-04-13 DIAGNOSIS — C3490 Malignant neoplasm of unspecified part of unspecified bronchus or lung: Secondary | ICD-10-CM | POA: Diagnosis not present

## 2021-04-13 DIAGNOSIS — Z5181 Encounter for therapeutic drug level monitoring: Secondary | ICD-10-CM

## 2021-04-13 DIAGNOSIS — C3412 Malignant neoplasm of upper lobe, left bronchus or lung: Secondary | ICD-10-CM

## 2021-04-13 DIAGNOSIS — C3491 Malignant neoplasm of unspecified part of right bronchus or lung: Secondary | ICD-10-CM | POA: Diagnosis not present

## 2021-04-13 DIAGNOSIS — Z923 Personal history of irradiation: Secondary | ICD-10-CM | POA: Diagnosis not present

## 2021-04-13 DIAGNOSIS — Z79899 Other long term (current) drug therapy: Secondary | ICD-10-CM | POA: Diagnosis not present

## 2021-04-13 LAB — CBC WITH DIFFERENTIAL (CANCER CENTER ONLY)
Abs Immature Granulocytes: 0.06 K/uL (ref 0.00–0.07)
Basophils Absolute: 0.1 K/uL (ref 0.0–0.1)
Basophils Relative: 0 %
Eosinophils Absolute: 0.3 K/uL (ref 0.0–0.5)
Eosinophils Relative: 3 %
HCT: 48.9 % (ref 39.0–52.0)
Hemoglobin: 17.2 g/dL — ABNORMAL HIGH (ref 13.0–17.0)
Immature Granulocytes: 1 %
Lymphocytes Relative: 16 %
Lymphs Abs: 1.9 K/uL (ref 0.7–4.0)
MCH: 33.5 pg (ref 26.0–34.0)
MCHC: 35.2 g/dL (ref 30.0–36.0)
MCV: 95.1 fL (ref 80.0–100.0)
Monocytes Absolute: 0.9 K/uL (ref 0.1–1.0)
Monocytes Relative: 7 %
Neutro Abs: 8.8 K/uL — ABNORMAL HIGH (ref 1.7–7.7)
Neutrophils Relative %: 73 %
Platelet Count: 208 K/uL (ref 150–400)
RBC: 5.14 MIL/uL (ref 4.22–5.81)
RDW: 13.5 % (ref 11.5–15.5)
WBC Count: 12 K/uL — ABNORMAL HIGH (ref 4.0–10.5)
nRBC: 0 % (ref 0.0–0.2)

## 2021-04-13 LAB — CMP (CANCER CENTER ONLY)
ALT: 38 U/L (ref 0–44)
AST: 21 U/L (ref 15–41)
Albumin: 4 g/dL (ref 3.5–5.0)
Alkaline Phosphatase: 102 U/L (ref 38–126)
Anion gap: 9 (ref 5–15)
BUN: 17 mg/dL (ref 8–23)
CO2: 25 mmol/L (ref 22–32)
Calcium: 9.7 mg/dL (ref 8.9–10.3)
Chloride: 102 mmol/L (ref 98–111)
Creatinine: 1.99 mg/dL — ABNORMAL HIGH (ref 0.61–1.24)
GFR, Estimated: 35 mL/min — ABNORMAL LOW (ref >60)
Glucose, Bld: 265 mg/dL — ABNORMAL HIGH (ref 70–99)
Potassium: 5.3 mmol/L — ABNORMAL HIGH (ref 3.5–5.1)
Sodium: 136 mmol/L (ref 135–145)
Total Bilirubin: 1.1 mg/dL (ref 0.3–1.2)
Total Protein: 7.2 g/dL (ref 6.5–8.1)

## 2021-04-13 NOTE — Progress Notes (Signed)
[place] [phone]   Fax:([phone]) [phone]  OFFICE PROGRESS NOTE  [name], DO [address] [place] [address]  DIAGNOSIS: Metastatic non-small cell lung cancer, adenocarcinoma initially diagnosed in December of 2004  PRIOR THERAPY: 1) status post course of concurrent chemoradiation under the care of Dr. Truddie Coco. 2) status post left upper lobe wedge resection on 01/10/2005 for recurrent disease in the left lung.  CURRENT THERAPY: Erlotinib 150 mg by mouth daily started in 2006.  Starting August 2022 the patient will be on erlotinib 100 mg p.o. daily.   INTERVAL HISTORY:  [name] 70 y.o. male returns to the clinic today for 14-monthfollow-up visit accompanied by his wife.  The patient is feeling fine today with no concerning complaints.  He started treatment with erlotinib 100 mg p.o. daily 2 weeks ago.  He discontinued the 150 mg secondary to intolerance.  He denied having any current conjunctivitis and he was treated with steroid eyedrops by his ophthalmologist.  He is feeling much better now.  He denied having any current chest pain, shortness of breath, cough or hemoptysis.  He denied having any fever or chills.  He has no nausea, vomiting, diarrhea or constipation.  He has no headache or visual changes.  He is here today for evaluation and repeat blood work.  MEDICAL HISTORY: Past Medical History:  Diagnosis Date   AAA (abdominal aortic aneurysm) [place]    a. 3.4 x 3.5 on scan 02/28/13.   Allergic reaction to contrast dye    Atherosclerosis    CAD (coronary artery disease)    a. stenting of RCA/LCx 2004. [place]. [place]:  s/p DES to LCx for severe ISR.   Diabetes mellitus    Diverticulosis    [place]    [place]    Encounter for therapeutic drug monitoring 06/06/2016   GERD (gastroesophageal reflux disease)    Hemorrhoids    Hepatic steatosis    History of radiation therapy 09/07/20-09/22/20   SBRT  left lung ; Dr JGery Pray  HTN (hypertension)    Hyperlipidemia    Kidney stone    Lung cancer ([facility] dx'd 2004   chemo/xrt comp; tarceva ongoing- January 2022 radiation    ALLERGIES:  is allergic to iohexol, betadine [povidone iodine], morphine and related, and other.  MEDICATIONS:  Current Outpatient Medications  Medication Sig Dispense Refill   ACCU-CHEK AVIVA PLUS test strip 1 each daily.     aspirin EC 81 MG tablet Take 81 mg by mouth daily. Swallow whole.     atorvastatin (LIPITOR) 40 MG tablet Take 40 mg by mouth daily.     clopidogrel (PLAVIX) 75 MG tablet TAKE 1 TABLET (75 MG TOTAL) BY MOUTH DAILY. 90 tablet 2   DULoxetine (CYMBALTA) 30 MG capsule Take 30 mg by mouth every morning.     Empagliflozin (JARDIANCE PO) Take 25 mg by mouth daily.     erlotinib (TARCEVA) 100 MG tablet Take 1 tablet (100 mg total) by mouth daily. Take on an empty stomach 1 hour before meals or 2 hours after. 90 tablet 1   ferrous sulfate 324 MG TBEC Take 324 mg by mouth.     isosorbide mononitrate (IMDUR) 60 MG 24 hr tablet Take 1 tablet (60 mg total) by mouth daily. 90 tablet 3   Lancets (FREESTYLE) lancets 1 each daily.     loratadine (CLARITIN) 10 MG tablet Take 10 mg by mouth daily.  losartan (COZAAR) 50 MG tablet Take 25 mg by mouth daily.     metoprolol tartrate (LOPRESSOR) 50 MG tablet Take 1 tablet (50 mg total) by mouth 2 (two) times daily. 180 tablet 3   nitroGLYCERIN (NITROSTAT) 0.4 MG SL tablet Place 1 tablet (0.4 mg total) under the tongue every 5 (five) minutes as needed for chest pain (up to 3 doses). 25 tablet 3   polyethylene glycol (MIRALAX / GLYCOLAX) 17 g packet Take 17 g by mouth daily. Temporarily hold if you have diarrhea. 30 each 0   TRADJENTA 5 MG TABS tablet Take 5 mg by mouth daily.     zaleplon (SONATA) 10 MG capsule Take 20 mg by mouth at bedtime.  5   No current facility-administered medications for this visit.    REVIEW OF SYSTEMS:  A comprehensive review of  systems was negative except for: Constitutional: positive for fatigue and weight loss   PHYSICAL EXAMINATION: General appearance: alert, cooperative, fatigued, and no distress Head: Normocephalic, without obvious abnormality, atraumatic Neck: no adenopathy Lymph nodes: Cervical, supraclavicular, and axillary nodes normal. Resp: clear to auscultation bilaterally Back: symmetric, no curvature. ROM normal. No CVA tenderness. Cardio: regular rate and rhythm, S1, S2 normal, no murmur, click, rub or gallop GI: soft, non-tender; bowel sounds normal; no masses,  no organomegaly Extremities: extremities normal, atraumatic, no cyanosis or edema    ECOG PERFORMANCE STATUS: 1 - Symptomatic but completely ambulatory  Blood pressure 117/71, pulse 82, temperature 97.8 F (36.6 C), temperature source Tympanic, resp. rate 20, height 5' 11"  (1.803 m), weight 181 lb 8 oz (82.3 kg), SpO2 97 %.  LABORATORY DATA: Lab Results  Component Value Date   WBC 12.0 (H) 04/13/2021   HGB 17.2 (H) 04/13/2021   HCT 48.9 04/13/2021   MCV 95.1 04/13/2021   PLT 208 04/13/2021      Chemistry      Component Value Date/Time   NA 135 01/07/2021 1214   NA 139 06/04/2017 0948   K 4.6 01/07/2021 1214   K 5.1 06/04/2017 0948   CL 103 01/07/2021 1214   CL 106 06/10/2012 0900   CO2 22 01/07/2021 1214   CO2 26 06/04/2017 0948   BUN 16 01/07/2021 1214   BUN 14.2 06/04/2017 0948   CREATININE 1.84 (H) 01/07/2021 1214   CREATININE 1.5 (H) 06/04/2017 0948      Component Value Date/Time   CALCIUM 9.3 01/07/2021 1214   CALCIUM 9.6 06/04/2017 0948   ALKPHOS 100 01/07/2021 1214   ALKPHOS 84 06/04/2017 0948   AST 22 01/07/2021 1214   AST 24 06/04/2017 0948   ALT 30 01/07/2021 1214   ALT 35 06/04/2017 0948   BILITOT 1.5 (H) 01/07/2021 1214   BILITOT 0.82 06/04/2017 0948       RADIOGRAPHIC STUDIES: No results found.   ASSESSMENT/PLAN:  This is a very pleasant 70 years old white male with metastatic non-small  cell lung cancer, adenocarcinoma with positive EGFR mutation and he is currently on treatment with Tarceva since 2006.  This was a switch of 8 months ago to the generic Erlotinib and he is tolerating it well except for few episodes of diarrhea and mild skin rash.  Since the switch to the generic Erlotinib, the patient has been complaining of worsening rash conjunctivitis as well as diarrhea that already improved. He also underwent SBRT to the enlarging left upper lobe lung nodule. He switch his treatment to Lorlatinib 100 mg p.o. daily 2 weeks ago secondary to intolerance of  the higher dose. The patient is tolerating this treatment much better with no significant adverse effects. I recommended for him to continue his current treatment with Lorlatinib 100 mg p.o. daily. I will see him back for follow-up visit in 3 months with repeat CT scan of the chest for restaging of his disease. The patient was advised to call immediately if he has any other concerning symptoms in the interval.  All questions were answered. The patient knows to call the clinic with any problems, questions or concerns. We can certainly see the patient much sooner if necessary.  Disclaimer: This note was dictated with voice recognition software. Similar sounding words can inadvertently be transcribed and may not be corrected upon review.

## 2021-04-22 ENCOUNTER — Other Ambulatory Visit: Payer: Self-pay

## 2021-04-22 ENCOUNTER — Ambulatory Visit (HOSPITAL_COMMUNITY)
Admission: RE | Admit: 2021-04-22 | Discharge: 2021-04-22 | Disposition: A | Discharge status: Home / Self Care | Payer: Medicare Other | Source: Ambulatory Visit | Attending: Cardiovascular Disease | Admitting: Cardiovascular Disease

## 2021-04-22 ENCOUNTER — Other Ambulatory Visit (HOSPITAL_COMMUNITY): Payer: Self-pay | Admitting: Cardiovascular Disease

## 2021-04-22 DIAGNOSIS — I714 Abdominal aortic aneurysm, without rupture, unspecified: Secondary | ICD-10-CM

## 2021-04-26 ENCOUNTER — Telehealth: Payer: Self-pay | Admitting: Cardiovascular Disease

## 2021-04-26 NOTE — Telephone Encounter (Signed)
Pt advised his Abd [place] results and verbalized understanding order placed for one year.. Pt recall reminder date moved earlier to help him make an appt for his 12 month recall and to be able to be seen by Dr. Burt Knack per his request.

## 2021-04-26 NOTE — Telephone Encounter (Signed)
  Pt is returning call to get results

## 2021-06-22 ENCOUNTER — Encounter: Payer: Self-pay | Admitting: Internal Medicine

## 2021-06-22 ENCOUNTER — Ambulatory Visit: Payer: Medicare Other | Admitting: Internal Medicine

## 2021-06-22 VITALS — BP 100/60 | HR 78 | Ht 73.0 in | Wt 180.0 lb

## 2021-06-22 DIAGNOSIS — K219 Gastro-esophageal reflux disease without esophagitis: Secondary | ICD-10-CM

## 2021-06-22 DIAGNOSIS — R1310 Dysphagia, unspecified: Secondary | ICD-10-CM | POA: Diagnosis not present

## 2021-06-22 DIAGNOSIS — Z7902 Long term (current) use of antithrombotics/antiplatelets: Secondary | ICD-10-CM | POA: Diagnosis not present

## 2021-06-22 DIAGNOSIS — K222 Esophageal obstruction: Secondary | ICD-10-CM

## 2021-06-22 NOTE — Progress Notes (Signed)
HISTORY OF PRESENT ILLNESS:  [name] is a 70 y.o. male, [employer] fan, with a history of GERD complicated by peptic stricture who presents today for follow-up.  [name]  He underwent upper endoscopy February 10, 2021.  He was found to have esophagitis as well as a peptic stricture which was balloon dilated.  Mild pyloric stenosis.  He was dilated with 54 [place] dilator.  He was prescribed pantoprazole 40 mg daily.  We did interrupt Plavix therapy for his procedure.  He was seen in follow-up March 28, 2021.  At that time he reported to me that his reflux symptoms and dysphagia have resolved.  However, he was instructed by his oncologist not to take his PPI due to possible drug interaction with Tarceva.  He was told that it was okay to take famotidine.  At the time of his last visit he was placed on famotidine 40 mg twice daily (as 20 mg was ineffective).  He presents today for follow-up.  He is accompanied by his wife.  He tells me that he has been miserable with acid reflux symptoms.  He will take an omeprazole on demand for a day or 2.  This helps tremendously.  He has noticed worsening dysphagia since his last visit.  He continues on Plavix  REVIEW OF SYSTEMS:  All non-GI ROS negative unless otherwise stated in the HPI.  Past Medical History:  Diagnosis Date   AAA (abdominal aortic aneurysm)    a. 3.4 x 3.5 on scan 02/28/13.   Allergic reaction to contrast dye    Atherosclerosis    CAD (coronary artery disease)    a. stenting of RCA/LCx 2004. b. [place] 08/2013:  s/p DES to LCx for severe ISR.   Diabetes mellitus    Diverticulosis    Emphysema lung ([place])    Emphysema of lung ([place])    Encounter for therapeutic drug monitoring 06/06/2016   GERD (gastroesophageal reflux disease)    Hemorrhoids    Hepatic steatosis    History of radiation therapy 09/07/20-09/22/20   SBRT left lung ; Dr  Gery Pray   HTN (hypertension)    Hyperlipidemia    Kidney stone    Lung cancer [facility]) dx'd 2004   chemo/xrt comp; tarceva ongoing- January 2022 radiation    Past Surgical History:  Procedure Laterality Date   ANGIOPLASTY / STENTING FEMORAL  08/14/2013   2004-groin   CATARACT EXTRACTION     COLONOSCOPY     LEFT HEART CATHETERIZATION WITH CORONARY ANGIOGRAM N/A 09/08/2013   Procedure: LEFT HEART CATHETERIZATION WITH CORONARY ANGIOGRAM;  Surgeon: Blane Ohara, MD;  Location[facility] C[facility] LAB;  Service: Cardiovascular;  Laterality: N/A;   left partial lobectomy     WISDOM TOOTH EXTRACTION      [name]  reports that he quit smoking about 18 years ago. His smoking use included cigarettes. He has never used smokeless tobacco. He reports that he does not drink alcohol and does not use drugs.  family history includes Diabetes in his sister; Heart disease in his sister; Ovarian cancer in his sister.  Allergies  Allergen Reactions   Iohexol Other (See Comments)     Code: HIVES, Desc: pt recieves 50mg  benadryl 1 hour prior to scan    Betadine [Povidone Iodine] Rash   Morphine And Related Anxiety    hallucinations   Other Rash  chlorascrub when starting IV       PHYSICAL EXAMINATION: Vital signs: BP 100/60   Pulse 78   Ht 6\' 1"  (1.854 m)   Wt 180 lb (81.6 kg)   BMI 23.75 kg/m   Constitutional: generally well-appearing, no acute distress Psychiatric: alert and oriented x3, cooperative Eyes: extraocular movements intact, anicteric, conjunctiva pink Mouth: oral pharynx moist, no lesions Neck: supple no lymphadenopathy Cardiovascular: heart regular rate and rhythm, no murmur Lungs: clear to auscultation bilaterally Abdomen: soft, nontender, nondistended, no obvious ascites, no peritoneal signs, normal bowel sounds, no organomegaly Rectal: Omitted Extremities: no clubbing, cyanosis, or lower extremity edema bilaterally Skin: no lesions on visible  extremities Neuro: No focal deficits.  Cranial nerves intact  ASSESSMENT:  1.  GERD with esophagitis and peptic stricture.  Significant symptoms on high-dose H2 receptor antagonist therapy. 2.  Peptic stricture.  Recurrent dysphagia post dilation 3.  Multiple medical problems including history of lung cancer on Tarceva and coronary artery disease on Plavix   PLAN:  1.  Reflux precautions 2.  Continue famotidine 3.  Add omeprazole 20 mg every other day, for now. 4.  Schedule upper endoscopy with esophageal dilation.  May use larger dilator.  Patient is HIGH RISK given his comorbidities and the need to address his antiplatelet therapy.The nature of the procedure, as well as the risks, benefits, and alternatives were carefully and thoroughly reviewed with the patient. Ample time for discussion and questions allowed. The patient understood, was satisfied, and agreed to proceed.  5.  Hold Plavix 1 week prior to the procedure as we did previously for his recent therapeutic endoscopy

## 2021-06-22 NOTE — Patient Instructions (Signed)
If you are age 71 or older, your body mass index should be between 23-30. Your Body mass index is 23.75 kg/m. If this is out of the aforementioned range listed, please consider follow up with your Primary Care Provider.  If you are age 25 or younger, your body mass index should be between 19-25. Your Body mass index is 23.75 kg/m. If this is out of the aformentioned range listed, please consider follow up with your Primary Care Provider.   ________________________________________________________  The [place] [facility] providers would like to encourage you to use [facility] to communicate with providers for non-urgent requests or questions.  Due to long hold times on the telephone, sending your provider a message by [facility] may be a faster and more efficient way to get a response.  Please allow 48 business hours for a response.  Please remember that this is for non-urgent requests.  _______________________________________________________  Dennis Bast have been scheduled for an endoscopy. Please follow written instructions given to you at your visit today. If you use inhalers (even only as needed), please bring them with you on the day of your procedure.  Take your Omeprazole 20mg  every other day

## 2021-07-08 ENCOUNTER — Encounter (HOSPITAL_COMMUNITY): Payer: Self-pay

## 2021-07-08 ENCOUNTER — Other Ambulatory Visit: Payer: Self-pay

## 2021-07-08 ENCOUNTER — Ambulatory Visit (HOSPITAL_COMMUNITY)
Admission: RE | Admit: 2021-07-08 | Discharge: 2021-07-08 | Disposition: A | Discharge status: Home / Self Care | Payer: Medicare Other | Source: Ambulatory Visit | Attending: Internal Medicine | Admitting: Internal Medicine

## 2021-07-08 ENCOUNTER — Inpatient Hospital Stay: Payer: Medicare Other | Attending: Internal Medicine

## 2021-07-08 DIAGNOSIS — C3490 Malignant neoplasm of unspecified part of unspecified bronchus or lung: Secondary | ICD-10-CM | POA: Insufficient documentation

## 2021-07-08 DIAGNOSIS — C3412 Malignant neoplasm of upper lobe, left bronchus or lung: Secondary | ICD-10-CM | POA: Insufficient documentation

## 2021-07-08 DIAGNOSIS — Z79899 Other long term (current) drug therapy: Secondary | ICD-10-CM | POA: Insufficient documentation

## 2021-07-08 LAB — CBC WITH DIFFERENTIAL (CANCER CENTER ONLY)
Abs Immature Granulocytes: 0.06 10*3/uL (ref 0.00–0.07)
Basophils Absolute: 0.1 10*3/uL (ref 0.0–0.1)
Basophils Relative: 1 %
Eosinophils Absolute: 0.5 10*3/uL (ref 0.0–0.5)
Eosinophils Relative: 4 %
HCT: 49.9 % (ref 39.0–52.0)
Hemoglobin: 17.8 g/dL — ABNORMAL HIGH (ref 13.0–17.0)
Immature Granulocytes: 0 %
Lymphocytes Relative: 19 %
Lymphs Abs: 2.6 10*3/uL (ref 0.7–4.0)
MCH: 33.5 pg (ref 26.0–34.0)
MCHC: 35.7 g/dL (ref 30.0–36.0)
MCV: 93.8 fL (ref 80.0–100.0)
Monocytes Absolute: 1.5 10*3/uL — ABNORMAL HIGH (ref 0.1–1.0)
Monocytes Relative: 11 %
Neutro Abs: 9.2 10*3/uL — ABNORMAL HIGH (ref 1.7–7.7)
Neutrophils Relative %: 65 %
Platelet Count: 218 10*3/uL (ref 150–400)
RBC: 5.32 MIL/uL (ref 4.22–5.81)
RDW: 13.3 % (ref 11.5–15.5)
WBC Count: 13.9 10*3/uL — ABNORMAL HIGH (ref 4.0–10.5)
nRBC: 0 % (ref 0.0–0.2)

## 2021-07-08 LAB — CMP (CANCER CENTER ONLY)
ALT: 34 U/L (ref 0–44)
AST: 24 U/L (ref 15–41)
Albumin: 4.1 g/dL (ref 3.5–5.0)
Alkaline Phosphatase: 83 U/L (ref 38–126)
Anion gap: 12 (ref 5–15)
BUN: 18 mg/dL (ref 8–23)
CO2: 22 mmol/L (ref 22–32)
Calcium: 9.4 mg/dL (ref 8.9–10.3)
Chloride: 104 mmol/L (ref 98–111)
Creatinine: 2.03 mg/dL — ABNORMAL HIGH (ref 0.61–1.24)
GFR, Estimated: 34 mL/min — ABNORMAL LOW (ref >60)
Glucose, Bld: 210 mg/dL — ABNORMAL HIGH (ref 70–99)
Potassium: 4.9 mmol/L (ref 3.5–5.1)
Sodium: 138 mmol/L (ref 135–145)
Total Bilirubin: 1.7 mg/dL — ABNORMAL HIGH (ref 0.3–1.2)
Total Protein: 7.5 g/dL (ref 6.5–8.1)

## 2021-07-12 ENCOUNTER — Other Ambulatory Visit: Payer: Self-pay

## 2021-07-12 ENCOUNTER — Inpatient Hospital Stay: Payer: Medicare Other | Admitting: Internal Medicine

## 2021-07-12 VITALS — BP 121/78 | HR 73 | Temp 97.7°F | Resp 18 | Ht 73.0 in | Wt 183.2 lb

## 2021-07-12 DIAGNOSIS — Z5181 Encounter for therapeutic drug level monitoring: Secondary | ICD-10-CM | POA: Diagnosis not present

## 2021-07-12 DIAGNOSIS — C3412 Malignant neoplasm of upper lobe, left bronchus or lung: Secondary | ICD-10-CM | POA: Diagnosis present

## 2021-07-12 DIAGNOSIS — Z79899 Other long term (current) drug therapy: Secondary | ICD-10-CM | POA: Diagnosis not present

## 2021-07-12 NOTE — Progress Notes (Signed)
[facility] [phone]   [phone]  OFFICE PROGRESS NOTE  [name] Margarita, DO [address] [place] [address]  DIAGNOSIS: Metastatic non-small cell lung cancer, adenocarcinoma initially diagnosed in December of 2004  PRIOR THERAPY: 1) status post course of concurrent chemoradiation under the care of Dr. Truddie [name]. 2) status post left upper lobe wedge resection on 01/10/2005 for recurrent disease in the left lung.  CURRENT THERAPY: Erlotinib 150 mg by mouth daily started in 2006.  Starting August 2022 the patient will be on erlotinib 100 mg p.o. daily.   INTERVAL HISTORY:  [name] 70 y.o. male returns to the clinic today for follow-up visit accompanied by his wife.  The patient is feeling fine today with no concerning complaints.  He denied having any current chest pain, shortness of breath, cough or hemoptysis.  He denied having any fever or chills.  He has no nausea, vomiting, diarrhea or constipation.  He denied having any headache or visual changes.  He continues to tolerate his treatment with Lorlatinib fairly well.  The patient had repeat CT scan of the chest performed recently and is here for evaluation and discussion of his scan results.   MEDICAL HISTORY: Past Medical History:  Diagnosis Date   AAA (abdominal aortic aneurysm)    a. 3.4 x 3.5 on scan 02/28/13.   Allergic reaction to contrast dye    Atherosclerosis    CAD (coronary artery disease)    a. stenting of RCA/LCx 2004. b. [place] 08/2013:  s/p DES to LCx for severe ISR.   Diabetes mellitus    Diverticulosis    Emphysema lung ([place])    Emphysema of lung ([place])    Encounter for therapeutic drug monitoring 06/06/2016   GERD (gastroesophageal reflux disease)    Hemorrhoids    Hepatic steatosis    History of radiation therapy 09/07/20-09/22/20   SBRT left lung ; Dr Gery Pray   HTN (hypertension)    Hyperlipidemia    Kidney stone    Lung cancer [facility]) dx'd 2004    chemo/xrt comp; tarceva ongoing- January 2022 radiation    ALLERGIES:  is allergic to iohexol, betadine [povidone iodine], morphine and related, and other.  MEDICATIONS:  Current Outpatient Medications  Medication Sig Dispense Refill   ACCU-CHEK AVIVA PLUS test strip 1 each daily.     aspirin EC 81 MG tablet Take 81 mg by mouth daily. Swallow whole.     atorvastatin (LIPITOR) 40 MG tablet Take 40 mg by mouth daily.     clopidogrel (PLAVIX) 75 MG tablet TAKE 1 TABLET (75 MG TOTAL) BY MOUTH DAILY. 90 tablet 2   DULoxetine (CYMBALTA) 30 MG capsule Take 60 mg by mouth every morning.     Empagliflozin (JARDIANCE PO) Take 25 mg by mouth daily.     erlotinib (TARCEVA) 100 MG tablet Take 1 tablet (100 mg total) by mouth daily. Take on an empty stomach 1 hour before meals or 2 hours after. 90 tablet 1   famotidine (PEPCID) 20 MG tablet Take 1 tablet by mouth 2 (two) times daily.     ferrous sulfate 324 MG TBEC Take 324 mg by mouth.     isosorbide mononitrate (IMDUR) 60 MG 24 hr tablet Take 1 tablet (60 mg total) by mouth daily. 90 tablet 3   Lancets (FREESTYLE) lancets 1 each daily.     loratadine (CLARITIN) 10 MG tablet Take 10 mg by mouth daily.     losartan (  COZAAR) 50 MG tablet Take 25 mg by mouth daily.     metoprolol tartrate (LOPRESSOR) 50 MG tablet Take 1 tablet (50 mg total) by mouth 2 (two) times daily. 180 tablet 3   nitroGLYCERIN (NITROSTAT) 0.4 MG SL tablet Place 1 tablet (0.4 mg total) under the tongue every 5 (five) minutes as needed for chest pain (up to 3 doses). 25 tablet 3   polyethylene glycol (MIRALAX / GLYCOLAX) 17 g packet Take 17 g by mouth daily. Temporarily hold if you have diarrhea. 30 each 0   TRADJENTA 5 MG TABS tablet Take 5 mg by mouth daily.     zaleplon (SONATA) 10 MG capsule Take 20 mg by mouth at bedtime.  5   No current facility-administered medications for this visit.    REVIEW OF SYSTEMS:  Constitutional: positive for fatigue Eyes: negative Ears, nose,  mouth, throat, and face: negative Respiratory: positive for cough Cardiovascular: negative Gastrointestinal: negative Genitourinary:negative Integument/breast: negative Hematologic/lymphatic: negative Musculoskeletal:negative Neurological: negative Behavioral/Psych: negative Endocrine: negative Allergic/Immunologic: negative   PHYSICAL EXAMINATION: General appearance: alert, cooperative, fatigued, and no distress Head: Normocephalic, without obvious abnormality, atraumatic Neck: no adenopathy Lymph nodes: Cervical, supraclavicular, and axillary nodes normal. Resp: clear to auscultation bilaterally Back: symmetric, no curvature. ROM normal. No CVA tenderness. Cardio: regular rate and rhythm, S1, S2 normal, no murmur, click, rub or gallop GI: soft, non-tender; bowel sounds normal; no masses,  no organomegaly Extremities: extremities normal, atraumatic, no cyanosis or edema Neurologic: Alert and oriented X 3, normal strength and tone. Normal symmetric reflexes. Normal coordination and gait    ECOG PERFORMANCE STATUS: 1 - Symptomatic but completely ambulatory  Blood pressure 121/78, pulse 73, temperature 97.7 F (36.5 C), temperature source Axillary, resp. rate 18, height 6' 1"  (1.854 m), weight 183 lb 3.2 oz (83.1 kg), SpO2 100 %.  LABORATORY DATA: Lab Results  Component Value Date   WBC 13.9 (H) 07/08/2021   HGB 17.8 (H) 07/08/2021   HCT 49.9 07/08/2021   MCV 93.8 07/08/2021   PLT 218 07/08/2021      Chemistry      Component Value Date/Time   NA 138 07/08/2021 1106   NA 139 06/04/2017 0948   K 4.9 07/08/2021 1106   K 5.1 06/04/2017 0948   CL 104 07/08/2021 1106   CL 106 06/10/2012 0900   CO2 22 07/08/2021 1106   CO2 26 06/04/2017 0948   BUN 18 07/08/2021 1106   BUN 14.2 06/04/2017 0948   CREATININE 2.03 (H) 07/08/2021 1106   CREATININE 1.5 (H) 06/04/2017 0948      Component Value Date/Time   CALCIUM 9.4 07/08/2021 1106   CALCIUM 9.6 06/04/2017 0948   ALKPHOS  83 07/08/2021 1106   ALKPHOS 84 06/04/2017 0948   AST 24 07/08/2021 1106   AST 24 06/04/2017 0948   ALT 34 07/08/2021 1106   ALT 35 06/04/2017 0948   BILITOT 1.7 (H) 07/08/2021 1106   BILITOT 0.82 06/04/2017 0948       RADIOGRAPHIC STUDIES: CT Chest Wo Contrast  Result Date: 07/10/2021 CLINICAL DATA:  Non-small cell lung cancer staging. Diagnosis in 2004 with upper lobectomy. Chemotherapy and radiation therapy completed. EXAM: CT CHEST WITHOUT CONTRAST TECHNIQUE: Multidetector CT imaging of the chest was performed following the standard protocol without IV contrast. COMPARISON:  Chest CT 01/07/2021 and 06/18/2020.  PET-CT 07/05/2020. FINDINGS: Cardiovascular: Diffuse atherosclerosis of the aorta, great vessels and coronary arteries again noted. No acute vascular findings on noncontrast imaging. There are calcifications of the aortic  valve. The heart size is normal. There is no pericardial effusion. Mediastinum/Nodes: There are no enlarged mediastinal, hilar or axillary lymph nodes.Hilar assessment is limited by the lack of intravenous contrast, although the hilar contours appear unchanged. The thyroid gland, trachea and esophagus demonstrate no significant findings. Lungs/Pleura: No pleural effusion or pneumothorax. Moderate centrilobular and paraseptal emphysema again noted. There is stable scarring posteriorly in the left upper lobe adjacent to previous wedge resection. A perifissural nodule along the superior aspect of the right major fissure measures 7 mm on image 81/5, unchanged. Radiation changes in the right lower lobe are again which now appear more linear with associated volume loss on the reformatted images. No recurrent pulmonary nodule is seen in this area. No new or enlarging nodules are seen elsewhere in the lungs. Upper abdomen: The visualized upper abdomen appears stable without acute findings. A low-density lesion posteriorly in the right hepatic lobe is stable. Multiple small  nonobstructing bilateral renal calculi are grossly stable. Musculoskeletal/Chest wall: There is no chest wall mass or suspicious osseous finding. Stable chronic right 7th rib fracture. IMPRESSION: 1. Evolving radiation changes in the right lower lobe. No recurrent pulmonary nodule identified in this region. 2. No evidence of metastatic disease. 3. Stable postsurgical changes in the left upper lobe. 4. Coronary and Aortic Atherosclerosis (ICD10-I70.0). Aortic valvular calcifications. Emphysema (ICD10-J43.9). Electronically Signed   By: Richardean Sale M.D.   On: 07/10/2021 17:28     ASSESSMENT/PLAN:  This is a very pleasant 70 years old white male with metastatic non-small cell lung cancer, adenocarcinoma with positive EGFR mutation and he is currently on treatment with Tarceva since 2006.  This was a switch of 8 months ago to the generic Erlotinib and he is tolerating it well except for few episodes of diarrhea and mild skin rash.  Since the switch to the generic Erlotinib, the patient has been complaining of worsening rash conjunctivitis as well as diarrhea that already improved. He also underwent SBRT to the enlarging left upper lobe lung nodule. His treatment was switched to erlotinib 100 mg p.o. twice daily 3 months ago. The patient has been tolerating this treatment well with no concerning adverse effects. He had repeat CT scan of the chest performed recently.  I personally and independently reviewed the scan and discussed the results with the patient and his wife. His scan showed no concerning findings for disease progression and he continues to have evolving radiation changes in the right lower lobe. I recommended for the patient to continue his current treatment with erlotinib 100 mg p.o. daily. I will see him back for follow-up visit in 3 months for evaluation and repeat blood work.  We will consider repeating his imaging studies every 6 months for now. The patient was advised to call  immediately if he has any other concerning symptoms in the interval.  All questions were answered. The patient knows to call the clinic with any problems, questions or concerns. We can certainly see the patient much sooner if necessary.  Disclaimer: This note was dictated with voice recognition software. Similar sounding words can inadvertently be transcribed and may not be corrected upon review.

## 2021-07-13 ENCOUNTER — Other Ambulatory Visit: Payer: Medicare Other

## 2021-07-13 ENCOUNTER — Ambulatory Visit: Payer: Medicare Other | Admitting: Internal Medicine

## 2021-07-14 ENCOUNTER — Telehealth: Payer: Self-pay | Admitting: Internal Medicine

## 2021-07-14 NOTE — Telephone Encounter (Signed)
Pt wanted to confirm that he needed to hold his blood thinner for 7 days prior to procedure and that he was supposed to hold his diabetic meds the am of the procedure. Reviewed instructions with him and questions were answered.

## 2021-07-14 NOTE — Telephone Encounter (Signed)
Patient called and stated that he has a few questions about his procedure on 12/14. Please advise.

## 2021-07-18 ENCOUNTER — Telehealth: Payer: Self-pay | Admitting: Internal Medicine

## 2021-07-18 NOTE — Telephone Encounter (Signed)
Sch per 11/29 los, pt aware

## 2021-07-27 ENCOUNTER — Encounter: Payer: Self-pay | Admitting: Internal Medicine

## 2021-07-27 ENCOUNTER — Ambulatory Visit (AMBULATORY_SURGERY_CENTER): Payer: Medicare Other | Admitting: Internal Medicine

## 2021-07-27 VITALS — BP 110/68 | HR 68 | Temp 96.9°F | Resp 16 | Ht 73.0 in | Wt 180.0 lb

## 2021-07-27 DIAGNOSIS — N179 Acute kidney failure, unspecified: Secondary | ICD-10-CM | POA: Insufficient documentation

## 2021-07-27 DIAGNOSIS — D509 Iron deficiency anemia, unspecified: Secondary | ICD-10-CM | POA: Insufficient documentation

## 2021-07-27 DIAGNOSIS — F321 Major depressive disorder, single episode, moderate: Secondary | ICD-10-CM | POA: Insufficient documentation

## 2021-07-27 DIAGNOSIS — K222 Esophageal obstruction: Secondary | ICD-10-CM | POA: Diagnosis not present

## 2021-07-27 DIAGNOSIS — I130 Hypertensive heart and chronic kidney disease with heart failure and stage 1 through stage 4 chronic kidney disease, or unspecified chronic kidney disease: Secondary | ICD-10-CM | POA: Insufficient documentation

## 2021-07-27 DIAGNOSIS — Z7902 Long term (current) use of antithrombotics/antiplatelets: Secondary | ICD-10-CM

## 2021-07-27 DIAGNOSIS — I209 Angina pectoris, unspecified: Secondary | ICD-10-CM | POA: Insufficient documentation

## 2021-07-27 DIAGNOSIS — E46 Unspecified protein-calorie malnutrition: Secondary | ICD-10-CM | POA: Insufficient documentation

## 2021-07-27 DIAGNOSIS — R1310 Dysphagia, unspecified: Secondary | ICD-10-CM | POA: Diagnosis not present

## 2021-07-27 DIAGNOSIS — I5032 Chronic diastolic (congestive) heart failure: Secondary | ICD-10-CM | POA: Insufficient documentation

## 2021-07-27 DIAGNOSIS — R12 Heartburn: Secondary | ICD-10-CM | POA: Insufficient documentation

## 2021-07-27 DIAGNOSIS — E538 Deficiency of other specified B group vitamins: Secondary | ICD-10-CM | POA: Insufficient documentation

## 2021-07-27 DIAGNOSIS — G629 Polyneuropathy, unspecified: Secondary | ICD-10-CM | POA: Insufficient documentation

## 2021-07-27 DIAGNOSIS — K219 Gastro-esophageal reflux disease without esophagitis: Secondary | ICD-10-CM | POA: Diagnosis not present

## 2021-07-27 DIAGNOSIS — Z0000 Encounter for general adult medical examination without abnormal findings: Secondary | ICD-10-CM | POA: Insufficient documentation

## 2021-07-27 DIAGNOSIS — I25118 Atherosclerotic heart disease of native coronary artery with other forms of angina pectoris: Secondary | ICD-10-CM | POA: Insufficient documentation

## 2021-07-27 DIAGNOSIS — K5900 Constipation, unspecified: Secondary | ICD-10-CM | POA: Insufficient documentation

## 2021-07-27 DIAGNOSIS — R7989 Other specified abnormal findings of blood chemistry: Secondary | ICD-10-CM | POA: Insufficient documentation

## 2021-07-27 MED ORDER — FAMOTIDINE 20 MG PO TABS: 20.0000 mg | ORAL_TABLET | Freq: Two times a day (BID) | ORAL | 11 refills | Status: DC

## 2021-07-27 MED ORDER — SODIUM CHLORIDE 0.9 % IV SOLN: 500.0000 mL | INTRAVENOUS | Status: DC

## 2021-07-27 NOTE — Progress Notes (Signed)
Sedate, gd SR, tolerated procedure well, VSS, report to RN 

## 2021-07-27 NOTE — Progress Notes (Signed)
HISTORY OF PRESENT ILLNESS:  [name] is a 70 y.o. male who was evaluated in the office last month for severe GERD symptoms and dysphagia.  See that complete H&P.  No interval change.  He is now for upper endoscopy with possible esophageal dilation  REVIEW OF SYSTEMS:  All non-GI ROS negative. Past Medical History:  Diagnosis Date   AAA (abdominal aortic aneurysm)    a. 3.4 x 3.5 on scan 02/28/13.   Allergic reaction to contrast dye    Atherosclerosis    CAD (coronary artery disease)    a. stenting of RCA/LCx 2004. b. [place] 08/2013:  s/p DES to LCx for severe ISR.   Diabetes mellitus    Diverticulosis    Emphysema lung ([place])    Emphysema of lung ([place])    Encounter for therapeutic drug monitoring 06/06/2016   GERD (gastroesophageal reflux disease)    Hemorrhoids    Hepatic steatosis    History of radiation therapy 09/07/20-09/22/20   SBRT left lung ; Dr Gery Pray   HTN (hypertension)    Hyperlipidemia    Kidney stone    Lung cancer [facility]) dx'd 2004   chemo/xrt comp; tarceva ongoing- January 2022 radiation    Past Surgical History:  Procedure Laterality Date   ANGIOPLASTY / STENTING FEMORAL  08/14/2013   2004-groin   CATARACT EXTRACTION     COLONOSCOPY     LEFT HEART CATHETERIZATION WITH CORONARY ANGIOGRAM N/A 09/08/2013   Procedure: LEFT HEART CATHETERIZATION WITH CORONARY ANGIOGRAM;  Surgeon: Blane Ohara, MD;  Location: [facility] CATH LAB;  Service: Cardiovascular;  Laterality: N/A;   left partial lobectomy     WISDOM TOOTH EXTRACTION      [name]  reports that he quit smoking about 18 years ago. His smoking use included cigarettes. He has never used smokeless tobacco. He reports that he does not drink alcohol and does not use drugs.  family history includes Diabetes in his sister; Heart disease in his sister; Ovarian cancer in his sister.  Allergies  Allergen Reactions   Iohexol Other (See Comments)     Code: HIVES, Desc: pt recieves  50mg  benadryl 1 hour prior to scan    Betadine [Povidone Iodine] Rash   Morphine And Related Anxiety    hallucinations   Other Rash    chlorascrub when starting IV       PHYSICAL EXAMINATION:  Vital signs: BP (!) 103/50    Pulse 79    Temp (!) 96.9 F (36.1 C)    Ht 6\' 1"  (1.854 m)    Wt 180 lb (81.6 kg)    SpO2 98%    BMI 23.75 kg/m  General: Well-developed, well-nourished, no acute distress HEENT: Sclerae are anicteric, conjunctiva pink. Oral mucosa intact Lungs: Clear Heart: Regular Abdomen: soft, nontender, nondistended, no obvious ascites, no peritoneal signs, normal bowel sounds. No organomegaly. Extremities: No edema Psychiatric: alert and oriented x3. Cooperative     ASSESSMENT:   1.  GERD with esophagitis and peptic stricture.  Significant symptoms on high-dose H2 receptor antagonist therapy. 2.  Peptic stricture.  Recurrent dysphagia post dilation 3.  Multiple medical problems including history of lung cancer on Tarceva and coronary artery disease on Plavix     PLAN:   1.  Reflux precautions 2.  Continue famotidine 3.  Add omeprazole 20 mg every other day, for now. 4.  Schedule upper endoscopy with esophageal dilation.  May use larger dilator.  Patient is HIGH  RISK given his comorbidities and the need to address his antiplatelet therapy.The nature of the procedure, as well as the risks, benefits, and alternatives were carefully and thoroughly reviewed with the patient. Ample time for discussion and questions allowed. The patient understood, was satisfied, and agreed to proceed.  5.  Hold Plavix 1 week prior to the procedure as we did previously for his recent therapeutic endoscopy

## 2021-07-27 NOTE — Progress Notes (Signed)
[name] did Vitals.

## 2021-07-27 NOTE — Op Note (Signed)
[facility] Patient Name: [name] Procedure Date: 07/27/2021 10:52 AM MRN: [id number] Endoscopist: Docia Chuck. Henrene Pastor , MD Age: 70 Referring MD:  Date of Birth: Dec 14, 1950 Gender: Male Account #: [id number] Procedure:                Upper GI endoscopy with [facility] dilation of the                            esophagus. 53 [place] Indications:              Dysphagia, Therapeutic procedure, Epigastric                            abdominal pain Medicines:                Monitored Anesthesia Care Procedure:                Pre-Anesthesia Assessment:                           - Prior to the procedure, a History and Physical                            was performed, and patient medications and                            allergies were reviewed. The patient's tolerance of                            previous anesthesia was also reviewed. The risks                            and benefits of the procedure and the sedation                            options and risks were discussed with the patient.                            All questions were answered, and informed consent                            was obtained. Prior Anticoagulants: The patient has                            taken Plavix (clopidogrel), last dose was 7 days                            prior to procedure. ASA Grade Assessment: III - A                            patient with severe systemic disease. After                            reviewing the risks and benefits, the patient was  deemed in satisfactory condition to undergo the                            procedure.                           After obtaining informed consent, the endoscope was                            passed under direct vision. Throughout the                            procedure, the patient's blood pressure, pulse, and                            oxygen saturations were monitored continuously. The                            GIF  [id number] #[id number] was introduced through the                            mouth, and advanced to the second part of duodenum.                            The upper GI endoscopy was accomplished without                            difficulty. The patient tolerated the procedure                            well. Scope In: Scope Out: Findings:                 The esophagus revealed erosive esophagitis                            involving the distal 2 cm. Significant edema with                            stricturing..                           One benign-appearing, intrinsic moderate stenosis                            was found 38 cm from the incisors. This stenosis                            measured 1.4 cm (inner diameter). The scope was                            withdrawn. Dilation was performed with a Maloney                            dilator with no resistance at 57 Fr.  The stomach revealed retained gastric contents                            consistent with gastroparesis.                           The examined duodenum was normal.                           The cardia and gastric fundus were normal on                            retroflexion, save hiatal hernia and retained                            gastric contents. Complications:            No immediate complications. Estimated Blood Loss:     Estimated blood loss: none. Impression:               1. GERD with erosive esophagitis                           2. Esophageal stricture status post dilation                           3. Diabetic gastroparesis. Recommendation:           - Patient has a contact number available for                            emergencies. The signs and symptoms of potential                            delayed complications were discussed with the                            patient. Return to normal activities tomorrow.                            Written discharge instructions were provided to  the                            patient.                           - Resume previous diet.                           - Continue present medications.                           - Resume Plavix (clopidogrel) at prior dose today.                           - Prescribe famotidine 40 mg twice daily; #60; 11  refills                           - Eat smaller portions to assist with digestion                           - GI follow-up 3 months Kaydn Kumpf N. Henrene Pastor, MD 07/27/2021 11:12:53 AM This report has been signed electronically.

## 2021-07-27 NOTE — Patient Instructions (Signed)
Follow dilation diet- see handout  Resume Plavix today  Please call Dr. Blanch Media office at the beginning of January to set up follow up appointment for 3 months  Eat smaller portions to assist with digestion  Take Famotidine 40 mg twice daily- refill sent to [facility]:   Refer to the procedure report that was given to you for any specific questions about what was found during the examination.  If the procedure report does not answer your questions, please call your gastroenterologist to clarify.  If you requested that your care partner not be given the details of your procedure findings, then the procedure report has been included in a sealed envelope for you to review at your convenience later.  YOU SHOULD EXPECT: Some feelings of bloating in the abdomen. Passage of more gas than usual.  Walking can help get rid of the air that was put into your GI tract during the procedure and reduce the bloating.  Please Note:  You might notice some irritation and congestion in your nose or some drainage.  This is from the oxygen used during your procedure.  There is no need for concern and it should clear up in a day or so.  SYMPTOMS TO REPORT IMMEDIATELY: Following upper endoscopy (EGD)  Vomiting of blood or coffee ground material  New chest pain or pain under the shoulder blades  Painful or persistently difficult swallowing  New shortness of breath  Fever of 100F or higher  Black, tarry-looking stools  For urgent or emergent issues, a gastroenterologist can be reached at any hour by calling [phone]. Do not use MyChart messaging for urgent concerns.    DIET:  follow dilation diet today- see handout!!   Drink plenty of fluids but you should avoid alcoholic beverages for 24 hours.  ACTIVITY:  You should plan to take it easy for the rest of today and you should NOT DRIVE or use heavy machinery until tomorrow (because of  the sedation medicines used during the test).    FOLLOW UP: Our staff will call the number listed on your records 48-72 hours following your procedure to check on you and address any questions or concerns that you may have regarding the information given to you following your procedure. If we do not reach you, we will leave a message.  We will attempt to reach you two times.  During this call, we will ask if you have developed any symptoms of COVID 19. If you develop any symptoms (ie: fever, flu-like symptoms, shortness of breath, cough etc.) before then, please call [phone].  If you test positive for Covid 19 in the 2 weeks post procedure, please call and report this information to [place].    If any biopsies were taken you will be contacted by phone or by letter within the next 1-3 weeks.  Please call us at [phone] if you have not heard about the biopsies in 3 weeks.    SIGNATURES/CONFIDENTIALITY: You and/or your care partner have signed paperwork which will be entered into your electronic medical record.  These signatures attest to the fact that that the information above on your After Visit Summary has been reviewed and is understood.  Full responsibility of the confidentiality of this discharge information lies with you and/or your care-partner.

## 2021-07-27 NOTE — Progress Notes (Signed)
Called to room to assist during endoscopic procedure.  Patient ID and intended procedure confirmed with present staff. Received instructions for my participation in the procedure from the performing physician.  

## 2021-07-29 ENCOUNTER — Telehealth: Payer: Self-pay

## 2021-07-29 NOTE — Telephone Encounter (Signed)
°  Follow up Call-  Call back number 07/27/2021 02/10/2021  Post procedure Call Back phone  # [phone]  Permission to leave phone message Yes Yes  Some recent data might be hidden     Patient questions:  Do you have a fever, pain , or abdominal swelling? No. Pain Score  0 *  Have you tolerated food without any problems? Yes.    Have you been able to return to your normal activities? Yes.    Do you have any questions about your discharge instructions: Diet   No. Medications  No. Follow up visit  No.  Do you have questions or concerns about your Care? No.  Actions: * If pain score is 4 or above: No action needed, pain <4. Have you developed a fever since your procedure? no  2.   Have you had an respiratory symptoms (SOB or cough) since your procedure? no  3.   Have you tested positive for COVID 19 since your procedure no  4.   Have you had any family members/close contacts diagnosed with the COVID 19 since your procedure?  no   If yes to any of these questions please route to Joylene John, RN and Joella Prince, RN

## 2021-08-01 ENCOUNTER — Telehealth: Payer: Self-pay | Admitting: Internal Medicine

## 2021-08-01 MED ORDER — FAMOTIDINE 40 MG PO TABS: 40.0000 mg | ORAL_TABLET | Freq: Two times a day (BID) | ORAL | 6 refills | Status: DC

## 2021-08-01 NOTE — Telephone Encounter (Signed)
I looked at ENDO report, Dr Henrene Pastor does want patient taking famotidine 40 twice a day   It was sent in wrong when he had his endoscopy. Will send new correct script to patients pharmacy

## 2021-08-01 NOTE — Telephone Encounter (Signed)
Patient saw Dr. Henrene Pastor last week.  He said the doctor increased his famotidine from 20 mg. To 40 mg. Twice a day and when he went to pick up the prescription it is 20 mg. Twice a day.  He just needs clarification.  Please call.  Thank you.

## 2021-08-16 IMAGING — DX DG ABD PORTABLE 1V
2 series · 2 of 2 positions shown · non-contrast
Comparison: CT abdomen and pelvis September 25, 2020

CLINICAL DATA: Constipation.

EXAM:
PORTABLE ABDOMEN - 1 VIEW

[abdomen kub (1 of 2)]
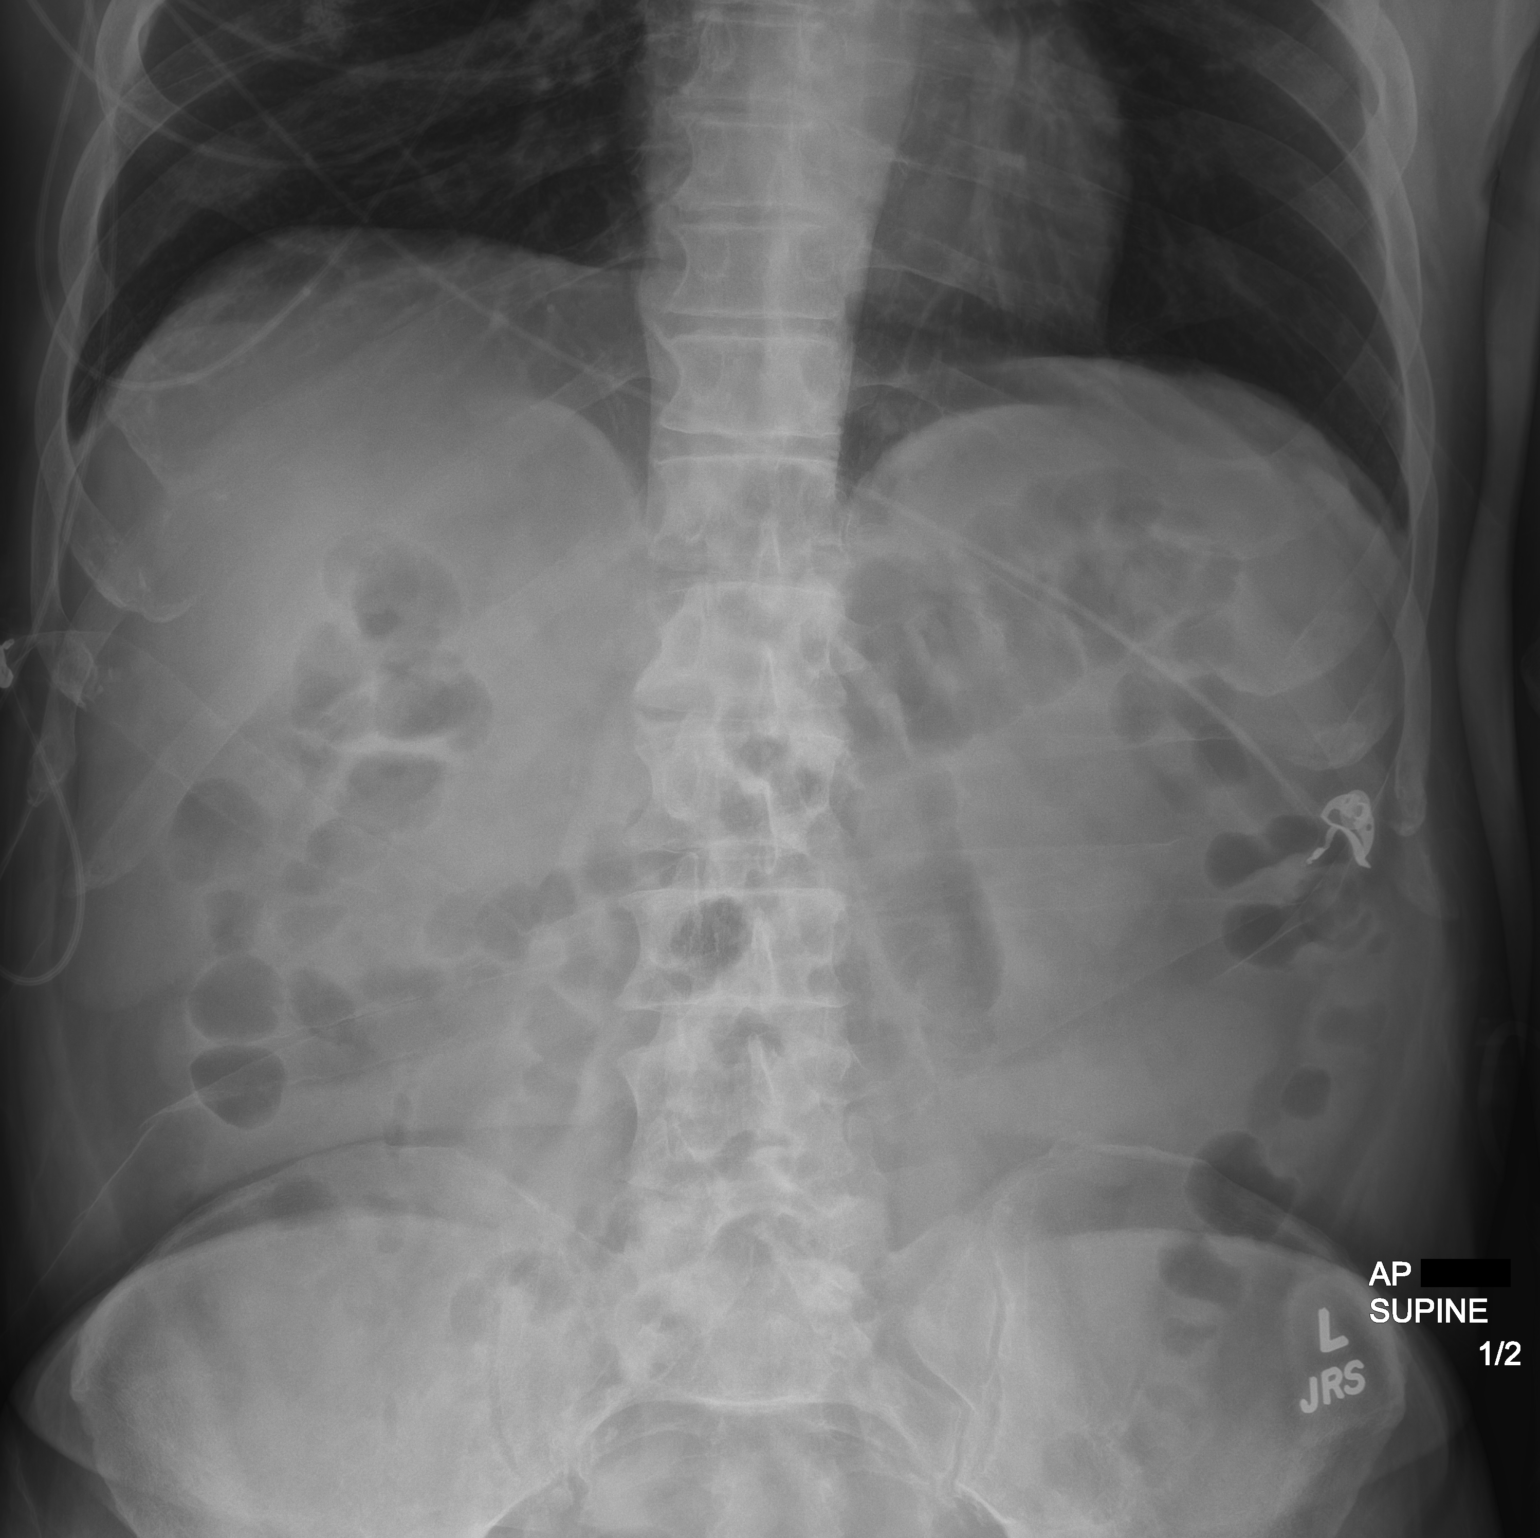

[abdomen kub (2 of 2)]
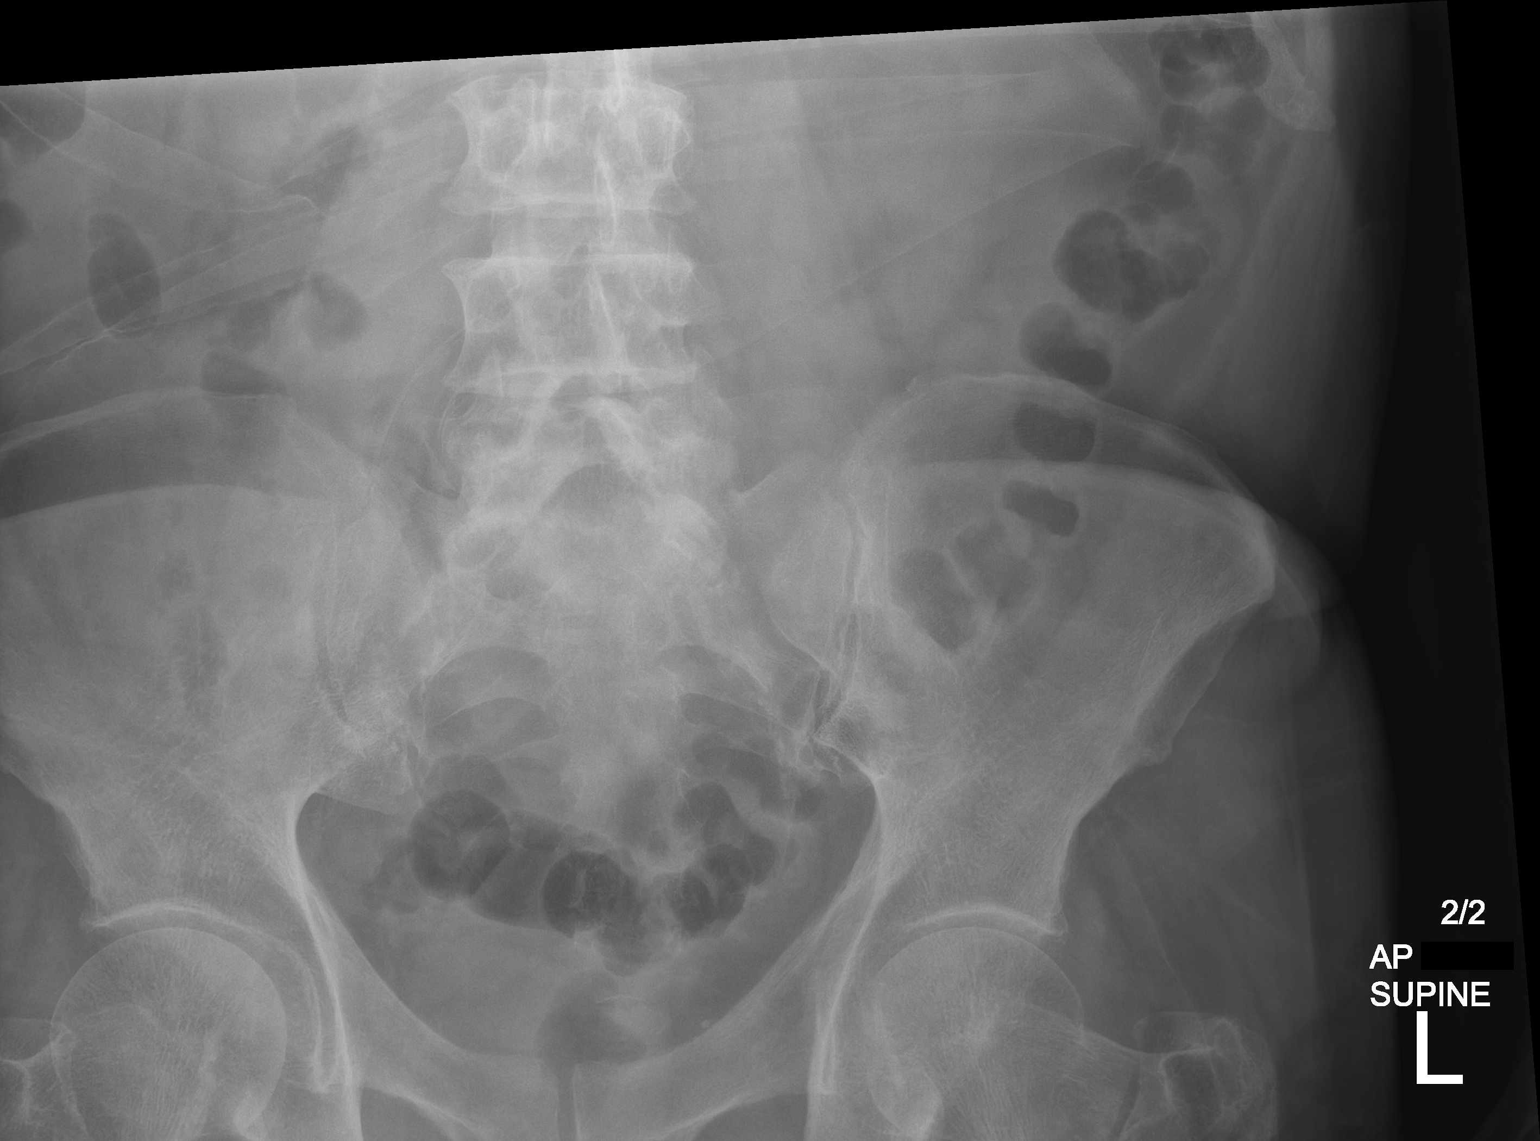

[2 of 2 positions shown; findings below may reference images not displayed]

FINDINGS: There is fairly mild stool volume in the colon. There is no bowel
dilatation or air-fluid level to suggest bowel obstruction. No free
air. Lung bases are clear. No abnormal calcifications.
IMPRESSION: Fairly mild stool volume in colon. No bowel obstruction or free air
evident. Lung bases clear.

## 2021-09-20 ENCOUNTER — Other Ambulatory Visit: Payer: Self-pay

## 2021-09-20 DIAGNOSIS — C3490 Malignant neoplasm of unspecified part of unspecified bronchus or lung: Secondary | ICD-10-CM

## 2021-09-20 MED ORDER — ERLOTINIB HCL 100 MG PO TABS: 100.0000 mg | ORAL_TABLET | Freq: Every day | ORAL | 1 refills | Status: DC

## 2021-10-11 ENCOUNTER — Other Ambulatory Visit: Payer: Self-pay

## 2021-10-11 ENCOUNTER — Inpatient Hospital Stay: Payer: Medicare Other | Attending: Internal Medicine

## 2021-10-11 ENCOUNTER — Inpatient Hospital Stay: Payer: Medicare Other | Admitting: Internal Medicine

## 2021-10-11 ENCOUNTER — Encounter: Payer: Self-pay | Admitting: Internal Medicine

## 2021-10-11 ENCOUNTER — Other Ambulatory Visit: Payer: Self-pay | Admitting: Cardiovascular Disease

## 2021-10-11 VITALS — BP 117/71 | HR 79 | Temp 97.1°F | Resp 17 | Ht 73.0 in | Wt 184.6 lb

## 2021-10-11 DIAGNOSIS — Z902 Acquired absence of lung [part of]: Secondary | ICD-10-CM | POA: Insufficient documentation

## 2021-10-11 DIAGNOSIS — C3490 Malignant neoplasm of unspecified part of unspecified bronchus or lung: Secondary | ICD-10-CM | POA: Diagnosis not present

## 2021-10-11 DIAGNOSIS — C3412 Malignant neoplasm of upper lobe, left bronchus or lung: Secondary | ICD-10-CM | POA: Diagnosis not present

## 2021-10-11 DIAGNOSIS — Z79899 Other long term (current) drug therapy: Secondary | ICD-10-CM | POA: Insufficient documentation

## 2021-10-11 LAB — CBC WITH DIFFERENTIAL (CANCER CENTER ONLY)
Abs Immature Granulocytes: 0.05 10*3/uL (ref 0.00–0.07)
Basophils Absolute: 0.1 10*3/uL (ref 0.0–0.1)
Basophils Relative: 1 %
Eosinophils Absolute: 0.3 10*3/uL (ref 0.0–0.5)
Eosinophils Relative: 3 %
HCT: 47.8 % (ref 39.0–52.0)
Hemoglobin: 17.1 g/dL — ABNORMAL HIGH (ref 13.0–17.0)
Immature Granulocytes: 1 %
Lymphocytes Relative: 20 %
Lymphs Abs: 2 10*3/uL (ref 0.7–4.0)
MCH: 33.3 pg (ref 26.0–34.0)
MCHC: 35.8 g/dL (ref 30.0–36.0)
MCV: 93.2 fL (ref 80.0–100.0)
Monocytes Absolute: 0.7 10*3/uL (ref 0.1–1.0)
Monocytes Relative: 7 %
Neutro Abs: 7 10*3/uL (ref 1.7–7.7)
Neutrophils Relative %: 68 %
Platelet Count: 236 10*3/uL (ref 150–400)
RBC: 5.13 MIL/uL (ref 4.22–5.81)
RDW: 13.5 % (ref 11.5–15.5)
WBC Count: 10 10*3/uL (ref 4.0–10.5)
nRBC: 0 % (ref 0.0–0.2)

## 2021-10-11 LAB — CMP (CANCER CENTER ONLY)
ALT: 21 U/L (ref 0–44)
AST: 18 U/L (ref 15–41)
Albumin: 4.2 g/dL (ref 3.5–5.0)
Alkaline Phosphatase: 78 U/L (ref 38–126)
Anion gap: 7 (ref 5–15)
BUN: 23 mg/dL (ref 8–23)
CO2: 25 mmol/L (ref 22–32)
Calcium: 9.7 mg/dL (ref 8.9–10.3)
Chloride: 104 mmol/L (ref 98–111)
Creatinine: 1.95 mg/dL — ABNORMAL HIGH (ref 0.61–1.24)
GFR, Estimated: 36 mL/min — ABNORMAL LOW (ref >60)
Glucose, Bld: 209 mg/dL — ABNORMAL HIGH (ref 70–99)
Potassium: 4.9 mmol/L (ref 3.5–5.1)
Sodium: 136 mmol/L (ref 135–145)
Total Bilirubin: 1.2 mg/dL (ref 0.3–1.2)
Total Protein: 7.4 g/dL (ref 6.5–8.1)

## 2021-10-11 NOTE — Progress Notes (Signed)
[facility] Telephone:([phone]) [phone]   Fax:(336) [phone]  OFFICE PROGRESS NOTE  [name], DO [address] [place] [address]  DIAGNOSIS: Metastatic non-small cell lung cancer, adenocarcinoma initially diagnosed in December of 2004  PRIOR THERAPY: 1) status post course of concurrent chemoradiation under the care of Dr. Truddie Coco. 2) status post left upper lobe wedge resection on 01/10/2005 for recurrent disease in the left lung.  CURRENT THERAPY: Erlotinib 150 mg by mouth daily started in 2006.  Starting August 2022 the patient will be on erlotinib 100 mg p.o. daily.   INTERVAL HISTORY:  [name] 71 y.o. male returns to the clinic today for follow-up visit.  The patient is feeling fine today with no concerning complaints.  He denied having any fatigue or weakness.  He denied having any chest pain, shortness of breath, cough or hemoptysis.  He has no nausea, vomiting, diarrhea or constipation.  He has no headache or visual changes.  He continues to tolerate his treatment with Erlotinib fairly well.  He is here today for evaluation and repeat blood work.   MEDICAL HISTORY: Past Medical History:  Diagnosis Date   AAA (abdominal aortic aneurysm)    a. 3.4 x 3.5 on scan 02/28/13.   Allergic reaction to contrast dye    Atherosclerosis    CAD (coronary artery disease)    a. stenting of RCA/LCx 2004. b. [place] 08/2013:  s/p DES to LCx for severe ISR.   Diabetes mellitus    Diverticulosis    Emphysema lung ([place])    Emphysema of lung ([place])    Encounter for therapeutic drug monitoring 06/06/2016   GERD (gastroesophageal reflux disease)    Hemorrhoids    Hepatic steatosis    History of radiation therapy 09/07/20-09/22/20   SBRT left lung ; Dr Gery Pray   HTN (hypertension)    Hyperlipidemia    Kidney stone    Lung cancer [facility]) dx'd 2004   chemo/xrt comp; tarceva ongoing- January 2022 radiation    ALLERGIES:  is allergic to iohexol, betadine [povidone  iodine], morphine and related, and other.  MEDICATIONS:  Current Outpatient Medications  Medication Sig Dispense Refill   ACCU-CHEK AVIVA PLUS test strip 1 each daily.     aspirin EC 81 MG tablet Take 81 mg by mouth daily. Swallow whole.     atorvastatin (LIPITOR) 40 MG tablet Take 40 mg by mouth daily.     clopidogrel (PLAVIX) 75 MG tablet TAKE 1 TABLET (75 MG TOTAL) BY MOUTH DAILY. 90 tablet 2   DULoxetine (CYMBALTA) 60 MG capsule Take 60 mg by mouth daily.     erlotinib (TARCEVA) 100 MG tablet Take 1 tablet (100 mg total) by mouth daily. Take on an empty stomach 1 hour before meals or 2 hours after. 90 tablet 1   famotidine (PEPCID) 40 MG tablet Take 1 tablet (40 mg total) by mouth 2 (two) times daily. 60 tablet 6   ferrous sulfate 324 MG TBEC Take 324 mg by mouth.     glimepiride (AMARYL) 2 MG tablet Take 2 mg by mouth every morning.     JARDIANCE 25 MG TABS tablet Take 25 mg by mouth daily.     Lancets (FREESTYLE) lancets 1 each daily.     losartan (COZAAR) 25 MG tablet TAKE 1 TABLET BY MOUTH EVERY DAY FOR 90 DAYS     metoprolol tartrate (LOPRESSOR) 50 MG tablet Take 1 tablet (50 mg total) by mouth 2 (two)  times daily. 180 tablet 3   nitroGLYCERIN (NITROSTAT) 0.4 MG SL tablet Place 1 tablet (0.4 mg total) under the tongue every 5 (five) minutes as needed for chest pain (up to 3 doses). 25 tablet 3   TRADJENTA 5 MG TABS tablet Take 5 mg by mouth daily.     zaleplon (SONATA) 10 MG capsule Take 20 mg by mouth at bedtime.  5   No current facility-administered medications for this visit.    REVIEW OF SYSTEMS:  A comprehensive review of systems was negative.   PHYSICAL EXAMINATION: General appearance: alert, cooperative, and no distress Head: Normocephalic, without obvious abnormality, atraumatic Neck: no adenopathy Lymph nodes: Cervical, supraclavicular, and axillary nodes normal. Resp: clear to auscultation bilaterally Back: symmetric, no curvature. ROM normal. No CVA  tenderness. Cardio: regular rate and rhythm, S1, S2 normal, no murmur, click, rub or gallop GI: soft, non-tender; bowel sounds normal; no masses,  no organomegaly Extremities: extremities normal, atraumatic, no cyanosis or edema    ECOG PERFORMANCE STATUS: 1 - Symptomatic but completely ambulatory  Blood pressure 117/71, pulse 79, temperature (!) 97.1 F (36.2 C), temperature source Tympanic, resp. rate 17, height _0  (1.854 m), weight 184 lb 9.6 oz (83.7 kg), SpO2 97 %.  LABORATORY DATA: Lab Results  Component Value Date   WBC 10.0 10/11/2021   HGB 17.1 (H) 10/11/2021   HCT 47.8 10/11/2021   MCV 93.2 10/11/2021   PLT 236 10/11/2021      Chemistry      Component Value Date/Time   NA 138 07/08/2021 1106   NA 139 06/04/2017 0948   K 4.9 07/08/2021 1106   K 5.1 06/04/2017 0948   CL 104 07/08/2021 1106   CL 106 06/10/2012 0900   CO2 22 07/08/2021 1106   CO2 26 06/04/2017 0948   BUN 18 07/08/2021 1106   BUN 14.2 06/04/2017 0948   CREATININE 2.03 (H) 07/08/2021 1106   CREATININE 1.5 (H) 06/04/2017 0948      Component Value Date/Time   CALCIUM 9.4 07/08/2021 1106   CALCIUM 9.6 06/04/2017 0948   ALKPHOS 83 07/08/2021 1106   ALKPHOS 84 06/04/2017 0948   AST 24 07/08/2021 1106   AST 24 06/04/2017 0948   ALT 34 07/08/2021 1106   ALT 35 06/04/2017 0948   BILITOT 1.7 (H) 07/08/2021 1106   BILITOT 0.82 06/04/2017 0948       RADIOGRAPHIC STUDIES: No results found.   ASSESSMENT/PLAN:  This is a very pleasant 71 years old white male with metastatic non-small cell lung cancer, adenocarcinoma with positive EGFR mutation and he is currently on treatment with Tarceva since 2006.  This was a switch of 8 months ago to the generic Erlotinib and he is tolerating it well except for few episodes of diarrhea and mild skin rash.  Since the switch to the generic Erlotinib, the patient has been complaining of worsening rash conjunctivitis as well as diarrhea that already improved. He  also underwent SBRT to the enlarging left upper lobe lung nodule. His treatment was switched to erlotinib 100 mg p.o. twice daily 6 months ago. The patient continues to tolerate this treatment well with no concerning adverse effects. His lab work today is unremarkable except for slightly elevated hemoglobin.  Comprehensive metabolic panel is still pending. I recommended for the patient to continue his current treatment with Erlotinib 100 mg p.o. daily. I will see him back for follow-up visit in 3 months for evaluation with repeat CT scan of the chest for restaging  of his disease.  All questions were answered. The patient knows to call the clinic with any problems, questions or concerns. We can certainly see the patient much sooner if necessary.  Disclaimer: This note was dictated with voice recognition software. Similar sounding words can inadvertently be transcribed and may not be corrected upon review.

## 2021-11-18 ENCOUNTER — Encounter: Payer: Self-pay | Admitting: Cardiovascular Disease

## 2021-11-18 ENCOUNTER — Ambulatory Visit: Payer: Medicare Other | Admitting: Cardiovascular Disease

## 2021-11-18 VITALS — BP 122/74 | HR 106 | Ht 73.0 in | Wt 190.4 lb

## 2021-11-18 DIAGNOSIS — E782 Mixed hyperlipidemia: Secondary | ICD-10-CM | POA: Diagnosis not present

## 2021-11-18 DIAGNOSIS — I25119 Atherosclerotic heart disease of native coronary artery with unspecified angina pectoris: Secondary | ICD-10-CM | POA: Diagnosis not present

## 2021-11-18 DIAGNOSIS — I10 Essential (primary) hypertension: Secondary | ICD-10-CM

## 2021-11-18 DIAGNOSIS — I7143 Infrarenal abdominal aortic aneurysm, without rupture: Secondary | ICD-10-CM

## 2021-11-18 NOTE — Patient Instructions (Signed)
Medication Instructions:  ?Your physician recommends that you continue on your current medications as directed. Please refer to the Current Medication list given to you today. ? ?*If you need a refill on your cardiac medications before your next appointment, please call your pharmacy* ? ? ?Lab Work: ?NONE ?If you have labs (blood work) drawn today and your tests are completely normal, you will receive your results only by: ?MyChart Message (if you have MyChart) OR ?A paper copy in the mail ?If you have any lab test that is abnormal or we need to change your treatment, we will call you to review the results. ? ? ?Testing/Procedures: ?NONE ? ? ?Follow-Up: ?At [facility], you and your health needs are our priority.  As part of our continuing mission to provide you with exceptional heart care, we have created designated Provider Care Teams.  These Care Teams include your primary Cardiologist (physician) and Advanced Practice Providers (APPs -  Physician Assistants and Nurse Practitioners) who all work together to provide you with the care you need, when you need it. ? ?We recommend signing up for the patient portal called "MyChart".  Sign up information is provided on this After Visit Summary.  MyChart is used to connect with patients for Virtual Visits (Telemedicine).  Patients are able to view lab/test results, encounter notes, upcoming appointments, etc.  Non-urgent messages can be sent to your provider as well.   ?To learn more about what you can do with MyChart, go to [email or web address].   ? ?Your next appointment:   ?1 year(s) ? ?The format for your next appointment:   ?In Person ? ?Provider:   ?Michael Cooper, MD   ? ?  ?

## 2021-11-18 NOTE — Progress Notes (Signed)
?Cardiology Office Note:   ? ?Date:  11/18/2021  ? ?ID:  [name], DOB 21-Feb-1951, MRN [id number] ? ?PCP:  [name], DO ?  ?[facility] Providers ?Cardiologist:  Sherren Mocha, MD    ? ?Referring MD: [name], DO  ? ?Chief Complaint  ?Patient presents with  ? Coronary Artery Disease  ? ? ?History of Present Illness:   ? ?[name] is a 70 y.o. male with a hx of coronary artery disease, presenting for follow-up of evaluation.  The patient had remote PCI of the right coronary artery left circumflex vessel in 2004 and repeat stenting of the left circumflex in 2015.  He has a history of metastatic lung cancer found in 2004.  Comorbid conditions include infrarenal abdominal aortic aneurysm, hypertension, mixed hyperlipidemia, diabetes, COPD. He had recurrence of lung cancer in 2022 requiring radiation and he is maintained now on Erlotinib under the care of Dr Julien Nordmann.  ? ?He complains of fatigue unchanged from last year. He has mild DOE when he exerts but no symptoms with normal activities. No orthopnea, PND, palpitations. He complains of feeling that his feet are cold. ? ?Past Medical History:  ?Diagnosis Date  ? AAA (abdominal aortic aneurysm) ([place])   ? a. 3.4 x 3.5 on scan 02/28/13.  ? Allergic reaction to contrast dye   ? Atherosclerosis   ? CAD (coronary artery disease)   ? a. stenting of RCA/LCx 2004. b. [place] 08/2013:  s/p DES to LCx for severe ISR.  ? Diabetes mellitus   ? Diverticulosis   ? Emphysema lung ([place])   ? Emphysema of lung ([place])   ? Encounter for therapeutic drug monitoring 06/06/2016  ? GERD (gastroesophageal reflux disease)   ? Hemorrhoids   ? Hepatic steatosis   ? History of radiation therapy 09/07/20-09/22/20  ? SBRT left lung ; Dr Gery Pray  ? HTN (hypertension)   ? Hyperlipidemia   ? Kidney stone   ? Lung cancer ([place]) dx'd 2004  ? chemo/xrt comp; Rise Mu ongoing- January 2022 radiation  ? ? ?Past Surgical History:  ?Procedure Laterality Date  ? ANGIOPLASTY / STENTING  FEMORAL  08/14/2013  ? 2004-groin  ? CATARACT EXTRACTION    ? COLONOSCOPY    ? LEFT HEART CATHETERIZATION WITH CORONARY ANGIOGRAM N/A 09/08/2013  ? Procedure: LEFT HEART CATHETERIZATION WITH CORONARY ANGIOGRAM;  Surgeon: Blane Ohara, MD;  Location: [facility] CATH LAB;  Service: Cardiovascular;  Laterality: N/A;  ? left partial lobectomy    ? WISDOM TOOTH EXTRACTION    ? ? ?Current Medications: ?Current Meds  ?Medication Sig  ? ACCU-CHEK AVIVA PLUS test strip 1 each daily.  ? aspirin EC 81 MG tablet Take 81 mg by mouth daily. Swallow whole.  ? atorvastatin (LIPITOR) 40 MG tablet Take 40 mg by mouth daily.  ? clopidogrel (PLAVIX) 75 MG tablet TAKE 1 TABLET (75 MG TOTAL) BY MOUTH DAILY.  ? DULoxetine (CYMBALTA) 60 MG capsule Take 60 mg by mouth daily.  ? DULoxetine (CYMBALTA) 60 MG capsule Take 60 mg by mouth daily. Per patient taking 120 mg once daily  ? erlotinib (TARCEVA) 100 MG tablet Take 1 tablet (100 mg total) by mouth daily. Take on an empty stomach 1 hour before meals or 2 hours after.  ? ferrous sulfate 324 MG TBEC Take 324 mg by mouth.  ? glimepiride (AMARYL) 2 MG tablet Take 2 mg by mouth every morning.  ? isosorbide mononitrate (IMDUR) 60 MG 24 hr tablet Take 60 mg  by mouth daily.  ? JARDIANCE 25 MG TABS tablet Take 25 mg by mouth daily.  ? Lancets (FREESTYLE) lancets 1 each daily.  ? losartan (COZAAR) 25 MG tablet TAKE 1 TABLET BY MOUTH EVERY DAY FOR 90 DAYS  ? metoprolol tartrate (LOPRESSOR) 50 MG tablet Take 1 tablet (50 mg total) by mouth 2 (two) times daily. Please make yearly appt with Dr. Burt Knack for June 2023 for future refills. Thank you 1st attempt  ? nitroGLYCERIN (NITROSTAT) 0.4 MG SL tablet Place 1 tablet (0.4 mg total) under the tongue every 5 (five) minutes as needed for chest pain (up to 3 doses).  ? TRADJENTA 5 MG TABS tablet Take 5 mg by mouth daily.  ? zaleplon (SONATA) 10 MG capsule Take 20 mg by mouth at bedtime.  ?  ? ?Allergies:   Iohexol, Ivp dye [iodinated contrast media], Morphine  and related, Other, and Povidone iodine  ? ?Social History  ? ?Socioeconomic History  ? Marital status: Married  ?  Spouse name: Not on file  ? Number of children: 1  ? Years of education: Not on file  ? Highest education level: Not on file  ?Occupational History  ? Occupation: Retired-disability  ?Tobacco Use  ? Smoking status: Former  ?  Types: Cigarettes  ?  Quit date: 08/14/2002  ?  Years since quitting: 19.2  ? Smokeless tobacco: Never  ?Vaping Use  ? Vaping Use: Never used  ?Substance and Sexual Activity  ? Alcohol use: No  ? Drug use: No  ? Sexual activity: Not on file  ?Other Topics Concern  ? Not on file  ?Social History Narrative  ? Not on file  ? ?Social Determinants of Health  ? ?Financial Resource Strain: Not on file  ?Food Insecurity: Not on file  ?Transportation Needs: Not on file  ?Physical Activity: Not on file  ?Stress: Not on file  ?Social Connections: Not on file  ?  ? ?Family History: ?The patient's family history includes Diabetes in his sister; Heart disease in his sister; Ovarian cancer in his sister. There is no history of Colon cancer, Esophageal cancer, Rectal cancer, or Stomach cancer. ? ?ROS:   ?Please see the history of present illness.    ?All other systems reviewed and are negative. ? ?EKGs/Labs/Other Studies Reviewed:   ? ?The following studies were reviewed today: ?Myoview Scan 07/14/2020: ?Nuclear stress EF: 46%. Mild generalized hypokinesis. ?There was no ST segment deviation noted during stress. ?There is mild reduced uptake noted along the inferior wall distribution seen at both rest and partially at stress. No large territory ischemia identified. ?This is an intermediate risk study based upon mildly reduced ejection fraction of 46%. ? ?EKG:  EKG is ordered today.  The ekg ordered today demonstrates sinus tachycardia 106 bpm, otherwise within normal limits. ? ?Abdominal aortic duplex scan 04/22/2021: ?Abdominal Aorta: There is evidence of abnormal dilatation of the distal  ?Abdominal  aorta. The largest aortic measurement is 3.8 cm. The largest  ?aortic diameter remains essentially unchanged compared to prior exam.  ?Previous diameter measurement was 3.8 cm  ?obtained on 04/22/20.  ?Stenosis: +--------------------+-------------+----------------+  ?Location            Stenosis     Comments          ?+--------------------+-------------+----------------+  ?Right Common Iliac  >50% stenosisstable, low-end   ?+--------------------+-------------+----------------+  ?Left Common Iliac   >50% stenosisstable, moderate  ?+--------------------+-------------+----------------+  ?Right External Iliac>50% stenosisstable, low-end   ?+--------------------+-------------+----------------+  ?Left External Iliac >50% stenosisstable,  low-end   ?+--------------------+-------------+----------------+  ? ?   ?Recent Labs: ?10/11/2021: ALT 21; BUN 23; Creatinine 1.95; Hemoglobin 17.1; Platelet Count 236; Potassium 4.9; Sodium 136  ?Recent Lipid Panel ?No results found for: CHOL, TRIG, HDL, CHOLHDL, VLDL, LDLCALC, LDLDIRECT ? ? ?Risk Assessment/Calculations:   ?  ? ?    ? ?Physical Exam:   ? ?VS:  BP 122/74   Pulse (!) 106   Ht 6\' 1"  (1.854 m)   Wt 190 lb 6.4 oz (86.4 kg)   SpO2 95%   BMI 25.12 kg/m?    ? ?Wt Readings from Last 3 Encounters:  ?11/18/21 190 lb 6.4 oz (86.4 kg)  ?10/11/21 184 lb 9.6 oz (83.7 kg)  ?07/27/21 180 lb (81.6 kg)  ?  ? ?GEN:  Well nourished, well developed in no acute distress ?HEENT: Normal ?NECK: No JVD; No carotid bruits ?LYMPHATICS: No lymphadenopathy ?CARDIAC: RRR, no murmurs, rubs, gallops ?RESPIRATORY:  Clear to auscultation without rales, wheezing or rhonchi  ?ABDOMEN: Soft, non-tender, non-distended ?MUSCULOSKELETAL:  No edema; No deformity  ?SKIN: Warm and dry ?NEUROLOGIC:  Alert and oriented x 3 ?PSYCHIATRIC:  Normal affect  ? ?ASSESSMENT:   ? ?1. Infrarenal abdominal aortic aneurysm (AAA) without rupture ([place])   ?2. Coronary artery disease involving native coronary  artery of native heart with angina pectoris ([place])   ?3. Mixed hyperlipidemia   ?4. Essential hypertension   ? ?PLAN:   ? ?In order of problems listed above: ? ?Patient asymptomatic.  Due for follow-up aortic ultrasound in [place]

## 2021-12-09 ENCOUNTER — Ambulatory Visit: Payer: Medicare Other | Admitting: Internal Medicine

## 2021-12-09 ENCOUNTER — Encounter: Payer: Self-pay | Admitting: Internal Medicine

## 2021-12-09 VITALS — BP 100/62 | HR 76 | Ht 73.0 in | Wt 192.0 lb

## 2021-12-09 DIAGNOSIS — K219 Gastro-esophageal reflux disease without esophagitis: Secondary | ICD-10-CM | POA: Diagnosis not present

## 2021-12-09 DIAGNOSIS — R1310 Dysphagia, unspecified: Secondary | ICD-10-CM

## 2021-12-09 DIAGNOSIS — K222 Esophageal obstruction: Secondary | ICD-10-CM

## 2021-12-09 MED ORDER — FAMOTIDINE 40 MG PO TABS: 40.0000 mg | ORAL_TABLET | Freq: Two times a day (BID) | ORAL | 6 refills | Status: DC

## 2021-12-09 NOTE — Patient Instructions (Signed)
If you are age 72 or older, your body mass index should be between 23-30. Your Body mass index is 25.33 kg/m?Marland Kitchen If this is out of the aforementioned range listed, please consider follow up with your Primary Care Provider. ? ?If you are age 31 or younger, your body mass index should be between 19-25. Your Body mass index is 25.33 kg/m?Marland Kitchen If this is out of the aformentioned range listed, please consider follow up with your Primary Care Provider.  ? ?________________________________________________________ ? ?The  GI providers would like to encourage you to use [facility] to communicate with providers for non-urgent requests or questions.  Due to long hold times on the telephone, sending your provider a message by [facility] may be a faster and more efficient way to get a response.  Please allow 48 business hours for a response.  Please remember that this is for non-urgent requests.  ?_______________________________________________________ ? ?We have sent the following medications to your pharmacy for you to pick up at your convenience:  Pepcid ? ?Call us if you have further problems ? ?

## 2021-12-09 NOTE — Progress Notes (Signed)
HISTORY OF PRESENT ILLNESS: ? ?[name] is a 71 y.o. male, [employer] fan, with a history of GERD complicated by peptic stricture who presents today for follow-up.  He has multiple medical problems as listed below.  His last office visit was June 22, 2021.  At that time he was experiencing recurrent dysphagia post dilation.  His oncologist had changed his omeprazole to famotidine due to concerns over interaction with Tarceva.  He was subsequently set up for upper endoscopy on July 27, 2021.  He was found to have erosive esophagitis with edema in the distal 2 cm.  A benign-appearing esophageal stricture was also noted.  This was dilated with 54 [place] Maloney dilator.  There was retained gastric contents consistent with diabetic gastroparesis.  He was prescribed famotidine 40 mg twice daily, told to eat smaller portions, and follow-up in 3 months.  Patient tells me that his dysphagia is 75% better.  He still needs to be careful with stringy meats and bread.  Some heartburn.  Review of his medicines seems to show that he is taking famotidine 20 mg twice daily.  GI review of systems is otherwise negative.  Review of blood work from February 2023 shows unremarkable comprehensive metabolic panel except for elevated glucose and creatinine.  Essentially normal CBC. ? ?REVIEW OF SYSTEMS: ? ?All non-GI ROS negative unless otherwise stated in the HPI except for anxiety, confusion, sleeping problems ? ?Past Medical History:  ?Diagnosis Date  ? AAA (abdominal aortic aneurysm) ([place])   ? a. 3.4 x 3.5 on scan 02/28/13.  ? Allergic reaction to contrast dye   ? Atherosclerosis   ? CAD (coronary artery disease)   ? a. stenting of RCA/LCx 2004. b. [place] 08/2013:  s/p DES to LCx for severe ISR.  ? Diabetes mellitus   ? Diverticulosis   ? Emphysema lung ([place])   ? Emphysema of lung ([place])   ? Encounter for therapeutic drug monitoring 06/06/2016  ? GERD (gastroesophageal reflux disease)   ? Hemorrhoids   ? Hepatic  steatosis   ? History of radiation therapy 09/07/20-09/22/20  ? SBRT left lung ; Dr Gery Pray  ? HTN (hypertension)   ? Hyperlipidemia   ? Kidney stone   ? Lung cancer ([place]) dx'd 2004  ? chemo/xrt comp; Rise Mu ongoing- January 2022 radiation  ? ? ?Past Surgical History:  ?Procedure Laterality Date  ? ANGIOPLASTY / STENTING FEMORAL  08/14/2013  ? 2004-groin  ? CATARACT EXTRACTION    ? COLONOSCOPY    ? LEFT HEART CATHETERIZATION WITH CORONARY ANGIOGRAM N/A 09/08/2013  ? Procedure: LEFT HEART CATHETERIZATION WITH CORONARY ANGIOGRAM;  Surgeon: Blane Ohara, MD;  Location: [facility] CATH LAB;  Service: Cardiovascular;  Laterality: N/A;  ? left partial lobectomy    ? WISDOM TOOTH EXTRACTION    ? ? ?Social History ?[name]  reports that he quit smoking about 19 years ago. His smoking use included cigarettes. He has never used smokeless tobacco. He reports that he does not drink alcohol and does not use drugs. ? ?family history includes Diabetes in his sister; Heart disease in his sister; Ovarian cancer in his sister. ? ?Allergies  ?Allergen Reactions  ? Iohexol Other (See Comments)  ?   Code: HIVES, Desc: pt recieves 50mg  benadryl 1 hour prior to scan ?  ? Ivp Dye [Iodinated Contrast Media]   ?  Other reaction(s): Unknown  ? Morphine And Related Anxiety  ?  hallucinations  ? Other Rash  ?  chlorascrub when starting IV  ? Povidone Iodine Rash  ?  Other reaction(s): heated rash  ? ? ?  ? ?PHYSICAL EXAMINATION: ?Vital signs: BP 100/62   Pulse 76   Ht 6\' 1"  (1.854 m)   Wt 192 lb (87.1 kg)   SpO2 98%   BMI 25.33 kg/m?   ?Constitutional: generally well-appearing, no acute distress ?Psychiatric: alert and oriented x3, cooperative ?Eyes: extraocular movements intact, anicteric, conjunctiva pink ?Mouth: oral pharynx moist, no lesions ?Neck: supple no lymphadenopathy ?Cardiovascular: heart regular rate and rhythm, no murmur ?Lungs: clear to auscultation bilaterally ?Abdomen: soft, nontender, nondistended, no obvious  ascites, no peritoneal signs, normal bowel sounds, no organomegaly ?Rectal: Omitted ?Extremities: no clubbing, cyanosis, or lower extremity edema bilaterally ?Skin: no lesions on visible extremities ?Neuro: No focal deficits.  Cranial nerves intact ? ?ASSESSMENT: ? ?1.  GERD complicated by erosive esophagitis and peptic stricture.  The patient cannot take PPI or his oncologist due to drug interaction with Tarceva.  Currently with intermittent solid food dysphagia on famotidine 20 mg twice daily. ?2.  Multiple medical problems ? ? ?PLAN: ? ?1.  Increase famotidine to 40 mg twice daily.  Prescribed. ?2.  I offered him repeat EGD with esophageal dilation, which I believe he needs.  He wishes to hold off and see how he does on the higher dose of famotidine. ?3.  Chew your food extremely well ?4.  Routine office follow-up 1 year.  He has been instructed to contact the office in the interim should he feel that he would benefit from esophageal dilation. ?A total time of 30 minutes was spent preparing to see the patient, obtaining history, performing physical exam, counseling and educating the patient regarding the above listed issues, prescribing medication, providing follow-up parameters, and documenting clinical information in the health record ? ? ? ?  ?

## 2021-12-20 ENCOUNTER — Telehealth: Payer: Self-pay | Admitting: Internal Medicine

## 2021-12-20 NOTE — Telephone Encounter (Signed)
Called patient regarding upcoming appointment, left a voicemail. 

## 2022-01-04 ENCOUNTER — Other Ambulatory Visit: Payer: Self-pay | Admitting: Cardiovascular Disease

## 2022-01-05 ENCOUNTER — Encounter (HOSPITAL_COMMUNITY): Payer: Self-pay

## 2022-01-05 ENCOUNTER — Inpatient Hospital Stay: Payer: Medicare Other | Attending: Internal Medicine

## 2022-01-05 ENCOUNTER — Other Ambulatory Visit: Payer: Self-pay

## 2022-01-05 ENCOUNTER — Ambulatory Visit (HOSPITAL_COMMUNITY)
Admission: RE | Admit: 2022-01-05 | Discharge: 2022-01-05 | Disposition: A | Discharge status: Home / Self Care | Payer: Medicare Other | Source: Ambulatory Visit | Attending: Internal Medicine | Admitting: Internal Medicine

## 2022-01-05 DIAGNOSIS — C3490 Malignant neoplasm of unspecified part of unspecified bronchus or lung: Secondary | ICD-10-CM

## 2022-01-05 DIAGNOSIS — C3412 Malignant neoplasm of upper lobe, left bronchus or lung: Secondary | ICD-10-CM | POA: Diagnosis not present

## 2022-01-05 LAB — CBC WITH DIFFERENTIAL (CANCER CENTER ONLY)
Abs Immature Granulocytes: 0.05 10*3/uL (ref 0.00–0.07)
Basophils Absolute: 0.1 10*3/uL (ref 0.0–0.1)
Basophils Relative: 1 %
Eosinophils Absolute: 0.3 10*3/uL (ref 0.0–0.5)
Eosinophils Relative: 3 %
HCT: 47.1 % (ref 39.0–52.0)
Hemoglobin: 16.9 g/dL (ref 13.0–17.0)
Immature Granulocytes: 1 %
Lymphocytes Relative: 20 %
Lymphs Abs: 2.1 10*3/uL (ref 0.7–4.0)
MCH: 33.5 pg (ref 26.0–34.0)
MCHC: 35.9 g/dL (ref 30.0–36.0)
MCV: 93.3 fL (ref 80.0–100.0)
Monocytes Absolute: 0.7 10*3/uL (ref 0.1–1.0)
Monocytes Relative: 7 %
Neutro Abs: 6.9 10*3/uL (ref 1.7–7.7)
Neutrophils Relative %: 68 %
Platelet Count: 249 10*3/uL (ref 150–400)
RBC: 5.05 MIL/uL (ref 4.22–5.81)
RDW: 14.1 % (ref 11.5–15.5)
WBC Count: 10.1 10*3/uL (ref 4.0–10.5)
nRBC: 0 % (ref 0.0–0.2)

## 2022-01-05 LAB — CMP (CANCER CENTER ONLY)
ALT: 26 U/L (ref 0–44)
AST: 19 U/L (ref 15–41)
Albumin: 4 g/dL (ref 3.5–5.0)
Alkaline Phosphatase: 87 U/L (ref 38–126)
Anion gap: 5 (ref 5–15)
BUN: 16 mg/dL (ref 8–23)
CO2: 29 mmol/L (ref 22–32)
Calcium: 9.3 mg/dL (ref 8.9–10.3)
Chloride: 103 mmol/L (ref 98–111)
Creatinine: 1.93 mg/dL — ABNORMAL HIGH (ref 0.61–1.24)
GFR, Estimated: 36 mL/min — ABNORMAL LOW (ref >60)
Glucose, Bld: 260 mg/dL — ABNORMAL HIGH (ref 70–99)
Potassium: 4 mmol/L (ref 3.5–5.1)
Sodium: 137 mmol/L (ref 135–145)
Total Bilirubin: 1.1 mg/dL (ref 0.3–1.2)
Total Protein: 6.9 g/dL (ref 6.5–8.1)

## 2022-01-12 ENCOUNTER — Other Ambulatory Visit: Payer: Self-pay

## 2022-01-12 ENCOUNTER — Encounter: Payer: Self-pay | Admitting: Internal Medicine

## 2022-01-12 ENCOUNTER — Inpatient Hospital Stay: Payer: Medicare Other | Attending: Internal Medicine | Admitting: Internal Medicine

## 2022-01-12 VITALS — BP 116/76 | HR 91 | Resp 18 | Wt 190.6 lb

## 2022-01-12 DIAGNOSIS — Z902 Acquired absence of lung [part of]: Secondary | ICD-10-CM | POA: Insufficient documentation

## 2022-01-12 DIAGNOSIS — L989 Disorder of the skin and subcutaneous tissue, unspecified: Secondary | ICD-10-CM | POA: Insufficient documentation

## 2022-01-12 DIAGNOSIS — Z79899 Other long term (current) drug therapy: Secondary | ICD-10-CM | POA: Diagnosis not present

## 2022-01-12 DIAGNOSIS — C3412 Malignant neoplasm of upper lobe, left bronchus or lung: Secondary | ICD-10-CM | POA: Diagnosis present

## 2022-01-12 NOTE — Progress Notes (Signed)
[place] Telephone:([phone]) [phone]   Fax:(336) [phone]  OFFICE PROGRESS NOTE  [name] Margarita, DO [address] [place] [address]  DIAGNOSIS: Metastatic non-small cell lung cancer, adenocarcinoma initially diagnosed in December of 2004  PRIOR THERAPY: 1) status post course of concurrent chemoradiation under the care of Dr. Truddie Coco. 2) status post left upper lobe wedge resection on 01/10/2005 for recurrent disease in the left lung.  CURRENT THERAPY: Erlotinib 150 mg by mouth daily started in 2006.  Starting August 2022 the patient will be on erlotinib 100 mg p.o. daily.   INTERVAL HISTORY:  [name] 71 y.o. male returns to the clinic today for follow-up visit accompanied by his wife.  The patient is feeling fine today with no concerning complaints.  He denied having any chest pain, shortness of breath, cough or hemoptysis.  He denied having any nausea, vomiting, diarrhea or constipation.  He has no headache or visual changes.  He denied having any weight loss or night sweats.  He continues to tolerate his treatment with Erlotinib fairly well.  The patient is here today for evaluation with repeat CT scan of the chest for restaging of his disease.  MEDICAL HISTORY: Past Medical History:  Diagnosis Date   AAA (abdominal aortic aneurysm) ([place])    a. 3.4 x 3.5 on scan 02/28/13.   Allergic reaction to contrast dye    Atherosclerosis    CAD (coronary artery disease)    a. stenting of RCA/LCx 2004. b. [place] 08/2013:  s/p DES to LCx for severe ISR.   Diabetes mellitus    Diverticulosis    Emphysema lung ([place])    Emphysema of lung ([place])    Encounter for therapeutic drug monitoring 06/06/2016   GERD (gastroesophageal reflux disease)    Hemorrhoids    Hepatic steatosis    History of radiation therapy 09/07/20-09/22/20   SBRT left lung ; Dr Gery Pray   HTN (hypertension)    Hyperlipidemia    Kidney stone    Lung cancer [facility]) dx'd 2004   chemo/xrt comp;  tarceva ongoing- January 2022 radiation    ALLERGIES:  is allergic to iohexol, ivp dye [iodinated contrast media], morphine and related, other, and povidone iodine.  MEDICATIONS:  Current Outpatient Medications  Medication Sig Dispense Refill   ACCU-CHEK AVIVA PLUS test strip 1 each daily.     aspirin EC 81 MG tablet Take 81 mg by mouth daily. Swallow whole.     atorvastatin (LIPITOR) 40 MG tablet Take 40 mg by mouth daily.     clopidogrel (PLAVIX) 75 MG tablet TAKE 1 TABLET (75 MG TOTAL) BY MOUTH DAILY. 90 tablet 2   cyanocobalamin 1000 MCG tablet Take 1,000 mcg by mouth daily.     DULoxetine (CYMBALTA) 60 MG capsule Take 60 mg by mouth daily.     DULoxetine (CYMBALTA) 60 MG capsule Take 60 mg by mouth daily. Per patient taking 120 mg once daily     erlotinib (TARCEVA) 100 MG tablet Take 1 tablet (100 mg total) by mouth daily. Take on an empty stomach 1 hour before meals or 2 hours after. 90 tablet 1   famotidine (PEPCID) 40 MG tablet Take 1 tablet (40 mg total) by mouth 2 (two) times daily. 60 tablet 6   ferrous sulfate 324 MG TBEC Take 324 mg by mouth.     glimepiride (AMARYL) 2 MG tablet Take 2 mg by mouth every morning.     JARDIANCE 25 MG TABS  tablet Take 25 mg by mouth daily.     Lancets (FREESTYLE) lancets 1 each daily.     losartan (COZAAR) 25 MG tablet TAKE 1 TABLET BY MOUTH EVERY DAY FOR 90 DAYS     metoprolol tartrate (LOPRESSOR) 50 MG tablet Take 1 tablet (50 mg total) by mouth 2 (two) times daily. 180 tablet 3   nitroGLYCERIN (NITROSTAT) 0.4 MG SL tablet Place 1 tablet (0.4 mg total) under the tongue every 5 (five) minutes as needed for chest pain (up to 3 doses). 25 tablet 3   TRADJENTA 5 MG TABS tablet Take 5 mg by mouth daily.     zaleplon (SONATA) 10 MG capsule Take 20 mg by mouth at bedtime.  5   No current facility-administered medications for this visit.    REVIEW OF SYSTEMS:  Constitutional: negative Eyes: negative Ears, nose, mouth, throat, and face:  negative Respiratory: negative Cardiovascular: negative Gastrointestinal: negative Genitourinary:negative Integument/breast: positive for skin lesion(s) Hematologic/lymphatic: negative Musculoskeletal:negative Neurological: negative Behavioral/Psych: negative Endocrine: negative Allergic/Immunologic: negative   PHYSICAL EXAMINATION: General appearance: alert, cooperative, and no distress Head: Normocephalic, without obvious abnormality, atraumatic Neck: no adenopathy Lymph nodes: Cervical, supraclavicular, and axillary nodes normal. Resp: clear to auscultation bilaterally Back: symmetric, no curvature. ROM normal. No CVA tenderness. Cardio: regular rate and rhythm, S1, S2 normal, no murmur, click, rub or gallop GI: soft, non-tender; bowel sounds normal; no masses,  no organomegaly Extremities: extremities normal, atraumatic, no cyanosis or edema Neurologic: Alert and oriented X 3, normal strength and tone. Normal symmetric reflexes. Normal coordination and gait    ECOG PERFORMANCE STATUS: 1 - Symptomatic but completely ambulatory  Blood pressure 116/76, pulse 91, resp. rate 18, weight 190 lb 9 oz (86.4 kg), SpO2 99 %.  LABORATORY DATA: Lab Results  Component Value Date   WBC 10.1 01/05/2022   HGB 16.9 01/05/2022   HCT 47.1 01/05/2022   MCV 93.3 01/05/2022   PLT 249 01/05/2022      Chemistry      Component Value Date/Time   NA 137 01/05/2022 0956   NA 139 06/04/2017 0948   K 4.0 01/05/2022 0956   K 5.1 06/04/2017 0948   CL 103 01/05/2022 0956   CL 106 06/10/2012 0900   CO2 29 01/05/2022 0956   CO2 26 06/04/2017 0948   BUN 16 01/05/2022 0956   BUN 14.2 06/04/2017 0948   CREATININE 1.93 (H) 01/05/2022 0956   CREATININE 1.5 (H) 06/04/2017 0948      Component Value Date/Time   CALCIUM 9.3 01/05/2022 0956   CALCIUM 9.6 06/04/2017 0948   ALKPHOS 87 01/05/2022 0956   ALKPHOS 84 06/04/2017 0948   AST 19 01/05/2022 0956   AST 24 06/04/2017 0948   ALT 26 01/05/2022  0956   ALT 35 06/04/2017 0948   BILITOT 1.1 01/05/2022 0956   BILITOT 0.82 06/04/2017 0948       RADIOGRAPHIC STUDIES: CT Chest Wo Contrast  Result Date: 01/07/2022 CLINICAL DATA:  w non-small cell lung cancer in a 71 year old male. * Tracking Code: BO * w EXAM: CT CHEST WITHOUT CONTRAST TECHNIQUE: Multidetector CT imaging of the chest was performed following the standard protocol without IV contrast. RADIATION DOSE REDUCTION: This exam was performed according to the departmental dose-optimization program which includes automated exposure control, adjustment of the mA and/or kV according to patient size and/or use of iterative reconstruction technique. COMPARISON:  July 08, 2021. FINDINGS: Cardiovascular: Calcified aortic atherosclerotic changes. Three-vessel coronary artery disease. Normal heart size. Normal caliber of  central pulmonary vessels. Limited assessment of cardiovascular structures given lack of intravenous contrast. Mediastinum/Nodes: No thoracic inlet, axillary, mediastinal or hilar adenopathy. Esophagus grossly normal. Lungs/Pleura: Pulmonary emphysema. Bandlike scarring in the RIGHT lower lobe related to prior therapy without change. Subtle nodular density along the minor fissure in the RIGHT chest also without change approximately 7 mm but more bandlike in the coronal plane. Spiculated nodular density along the major fissure in the RIGHT chest stable at 7 mm (image 81/7) RIGHT middle lobe scarring is similar to previous imaging. Scarring in the LEFT chest from prior wedge resection in the upper lobe also similar to the prior study. Airways are patent. Upper Abdomen: Limited imaging of upper abdominal contents without acute process or adenopathy. Adrenal glands are normal. Cortical scarring of the bilateral kidneys as an incidental finding grossly unchanged, kidneys are incompletely imaged. Musculoskeletal: Unchanged expansile in heterogeneous appearance of RIGHT posterior ribs 7 and  8. No acute bone finding. No destructive bone process. Spinal degenerative changes. IMPRESSION: No change in the appearance of post treatment changes in the LEFT upper and RIGHT lower lobe. Spiculated nodular density along the major fissure in the RIGHT chest is stable as is an area of scarring along the minor fissure as outlined above. Aortic Atherosclerosis (ICD10-I70.0) and Emphysema (ICD10-J43.9). Electronically Signed   By: Zetta Bills M.D.   On: 01/07/2022 11:43     ASSESSMENT/PLAN:  This is a very pleasant 71 years old white male with metastatic non-small cell lung cancer, adenocarcinoma with positive EGFR mutation and he is currently on treatment with Tarceva since 2006.  This was a switch of 8 months ago to the generic Erlotinib and he is tolerating it well except for few episodes of diarrhea and mild skin rash.  Since the switch to the generic Erlotinib, the patient has been complaining of worsening rash conjunctivitis as well as diarrhea that already improved. He also underwent SBRT to the enlarging left upper lobe lung nodule. His treatment was switched to erlotinib 100 mg p.o. twice daily 9 months ago. The patient has been tolerating his treatment well with no concerning adverse effects. He had repeat CT scan of the chest performed recently.  I personally and independently reviewed the scan and discussed the result with the patient and his wife. His scan showed no concerning findings for disease recurrence or metastasis. I recommended for him to continue his current treatment with erlotinib 100 mg p.o. daily. I will see him back for follow-up visit in 3 months for evaluation and repeat blood work. For the skin lesion on the back, I advised the patient to see dermatology for evaluation and consideration of biopsy to rule out any skin cancer. The patient was advised to call immediately if he has any other concerning symptoms in the interval.  All questions were answered. The patient  knows to call the clinic with any problems, questions or concerns. We can certainly see the patient much sooner if necessary.  Disclaimer: This note was dictated with voice recognition software. Similar sounding words can inadvertently be transcribed and may not be corrected upon review.

## 2022-02-08 ENCOUNTER — Telehealth: Payer: Self-pay | Admitting: Internal Medicine

## 2022-02-08 NOTE — Telephone Encounter (Signed)
Called patient regarding upcoming August appointment, patient has been called and voicemail was left.

## 2022-03-17 ENCOUNTER — Other Ambulatory Visit: Payer: Self-pay | Admitting: Medical Oncology

## 2022-03-17 DIAGNOSIS — C3490 Malignant neoplasm of unspecified part of unspecified bronchus or lung: Secondary | ICD-10-CM

## 2022-03-17 MED ORDER — ERLOTINIB HCL 100 MG PO TABS: 100.0000 mg | ORAL_TABLET | Freq: Every day | ORAL | 1 refills | Status: DC

## 2022-04-03 ENCOUNTER — Ambulatory Visit: Payer: Medicare Other | Admitting: Cardiovascular Disease

## 2022-04-04 ENCOUNTER — Other Ambulatory Visit: Payer: Self-pay | Admitting: Cardiovascular Disease

## 2022-04-04 ENCOUNTER — Other Ambulatory Visit: Payer: Self-pay | Admitting: Internal Medicine

## 2022-04-04 DIAGNOSIS — I259 Chronic ischemic heart disease, unspecified: Secondary | ICD-10-CM

## 2022-04-11 ENCOUNTER — Inpatient Hospital Stay: Payer: Medicare Other | Attending: Internal Medicine

## 2022-04-11 ENCOUNTER — Other Ambulatory Visit: Payer: Self-pay

## 2022-04-11 ENCOUNTER — Inpatient Hospital Stay: Payer: Medicare Other | Admitting: Internal Medicine

## 2022-04-11 VITALS — BP 141/80 | HR 71 | Temp 97.8°F | Wt 193.0 lb

## 2022-04-11 DIAGNOSIS — Z923 Personal history of irradiation: Secondary | ICD-10-CM | POA: Diagnosis not present

## 2022-04-11 DIAGNOSIS — Z79899 Other long term (current) drug therapy: Secondary | ICD-10-CM | POA: Diagnosis not present

## 2022-04-11 DIAGNOSIS — C3412 Malignant neoplasm of upper lobe, left bronchus or lung: Secondary | ICD-10-CM | POA: Insufficient documentation

## 2022-04-11 DIAGNOSIS — C3490 Malignant neoplasm of unspecified part of unspecified bronchus or lung: Secondary | ICD-10-CM

## 2022-04-11 LAB — CBC WITH DIFFERENTIAL (CANCER CENTER ONLY)
Abs Immature Granulocytes: 0.04 10*3/uL (ref 0.00–0.07)
Basophils Absolute: 0.1 10*3/uL (ref 0.0–0.1)
Basophils Relative: 1 %
Eosinophils Absolute: 0.3 10*3/uL (ref 0.0–0.5)
Eosinophils Relative: 3 %
HCT: 45 % (ref 39.0–52.0)
Hemoglobin: 16.6 g/dL (ref 13.0–17.0)
Immature Granulocytes: 0 %
Lymphocytes Relative: 21 %
Lymphs Abs: 2.3 10*3/uL (ref 0.7–4.0)
MCH: 33.6 pg (ref 26.0–34.0)
MCHC: 36.9 g/dL — ABNORMAL HIGH (ref 30.0–36.0)
MCV: 91.1 fL (ref 80.0–100.0)
Monocytes Absolute: 0.8 10*3/uL (ref 0.1–1.0)
Monocytes Relative: 8 %
Neutro Abs: 7.2 10*3/uL (ref 1.7–7.7)
Neutrophils Relative %: 67 %
Platelet Count: 208 10*3/uL (ref 150–400)
RBC: 4.94 MIL/uL (ref 4.22–5.81)
RDW: 13.5 % (ref 11.5–15.5)
WBC Count: 10.6 10*3/uL — ABNORMAL HIGH (ref 4.0–10.5)
nRBC: 0 % (ref 0.0–0.2)

## 2022-04-11 LAB — CMP (CANCER CENTER ONLY)
ALT: 25 U/L (ref 0–44)
AST: 20 U/L (ref 15–41)
Albumin: 4.1 g/dL (ref 3.5–5.0)
Alkaline Phosphatase: 88 U/L (ref 38–126)
Anion gap: 4 — ABNORMAL LOW (ref 5–15)
BUN: 15 mg/dL (ref 8–23)
CO2: 28 mmol/L (ref 22–32)
Calcium: 9.8 mg/dL (ref 8.9–10.3)
Chloride: 106 mmol/L (ref 98–111)
Creatinine: 1.69 mg/dL — ABNORMAL HIGH (ref 0.61–1.24)
GFR, Estimated: 43 mL/min — ABNORMAL LOW (ref >60)
Glucose, Bld: 196 mg/dL — ABNORMAL HIGH (ref 70–99)
Potassium: 4.9 mmol/L (ref 3.5–5.1)
Sodium: 138 mmol/L (ref 135–145)
Total Bilirubin: 0.9 mg/dL (ref 0.3–1.2)
Total Protein: 6.9 g/dL (ref 6.5–8.1)

## 2022-04-11 NOTE — Progress Notes (Signed)
[place] Telephone:(336) [phone]   Fax:(336) [phone]  OFFICE PROGRESS NOTE  [name], DO [address] [place] [address]  DIAGNOSIS: Metastatic non-small cell lung cancer, adenocarcinoma initially diagnosed in December of 2004  PRIOR THERAPY: 1) status post course of concurrent chemoradiation under the care of Dr. Truddie Coco. 2) status post left upper lobe wedge resection on 01/10/2005 for recurrent disease in the left lung.  CURRENT THERAPY: Erlotinib 150 mg by mouth daily started in 2006.  Starting August 2022 the patient will be on erlotinib 100 mg p.o. daily.   INTERVAL HISTORY:  [name] 71 y.o. male returns to the clinic today for 43-monthfollow-up visit.  The patient is feeling fine today with no concerning complaints.  The skin rash has significantly improved but it comes and goes.  He has no significant diarrhea.  He has no chest pain, shortness of breath, cough or hemoptysis.  He has no nausea, vomiting, diarrhea or constipation.  He has no headache or visual changes.  He continues to tolerate his treatment with Erlotinib fairly well.  He is here today for evaluation and repeat blood work.  MEDICAL HISTORY: Past Medical History:  Diagnosis Date   [facility]    a. 3.4 x 3.5 on scan 02/28/13.   Allergic reaction to contrast dye    Atherosclerosis    CAD (coronary artery disease)    a. stenting of RCA/LCx 2004. [place]  s/p DES to LCx for severe ISR.   Diabetes mellitus    Diverticulosis    [place]    [place]    Encounter for therapeutic drug monitoring 06/06/2016   GERD (gastroesophageal reflux disease)    Hemorrhoids    Hepatic steatosis    History of radiation therapy 09/07/20-09/22/20   SBRT left lung ; Dr JGery Pray  HTN (hypertension)    Hyperlipidemia    Kidney stone    Lung cancer [facility] dx'd 2004   chemo/xrt comp; tarceva ongoing- January 2022 radiation     ALLERGIES:  is allergic to iohexol, ivp dye [iodinated contrast media], morphine and related, other, and povidone iodine.  MEDICATIONS:  Current Outpatient Medications  Medication Sig Dispense Refill   ACCU-CHEK AVIVA PLUS test strip 1 each daily.     aspirin EC 81 MG tablet Take 81 mg by mouth daily. Swallow whole.     atorvastatin (LIPITOR) 40 MG tablet Take 40 mg by mouth daily.     clopidogrel (PLAVIX) 75 MG tablet TAKE 1 TABLET (75 MG TOTAL) BY MOUTH DAILY. 90 tablet 2   cyanocobalamin 1000 MCG tablet Take 1,000 mcg by mouth daily.     DULoxetine (CYMBALTA) 60 MG capsule Take 60 mg by mouth daily. Per patient taking 120 mg once daily     erlotinib (TARCEVA) 100 MG tablet Take 1 tablet (100 mg total) by mouth daily. Take on an empty stomach 1 hour before meals or 2 hours after. 90 tablet 1   famotidine (PEPCID) 40 MG tablet TAKE 1 TABLET BY MOUTH TWICE A DAY 180 tablet 2   ferrous sulfate 324 MG TBEC Take 324 mg by mouth.     glimepiride (AMARYL) 2 MG tablet Take 2 mg by mouth every morning.     isosorbide mononitrate (IMDUR) 60 MG 24 hr tablet TAKE 1 TABLET BY MOUTH EVERY DAY 90 tablet 2   JARDIANCE 25 MG TABS tablet Take 25 mg by  mouth daily.     Lancets (FREESTYLE) lancets 1 each daily.     losartan (COZAAR) 25 MG tablet TAKE 1 TABLET BY MOUTH EVERY DAY FOR 90 DAYS     metoprolol tartrate (LOPRESSOR) 50 MG tablet Take 1 tablet (50 mg total) by mouth 2 (two) times daily. 180 tablet 3   nitroGLYCERIN (NITROSTAT) 0.4 MG SL tablet Place 1 tablet (0.4 mg total) under the tongue every 5 (five) minutes as needed for chest pain (up to 3 doses). 25 tablet 3   TRADJENTA 5 MG TABS tablet Take 5 mg by mouth daily.     zaleplon (SONATA) 10 MG capsule Take 20 mg by mouth at bedtime.  5   No current facility-administered medications for this visit.    REVIEW OF SYSTEMS:  A comprehensive review of systems was negative.   PHYSICAL EXAMINATION: General appearance: alert, cooperative, and no  distress Head: Normocephalic, without obvious abnormality, atraumatic Neck: no adenopathy Lymph nodes: Cervical, supraclavicular, and axillary nodes normal. Resp: clear to auscultation bilaterally Back: symmetric, no curvature. ROM normal. No CVA tenderness. Cardio: regular rate and rhythm, S1, S2 normal, no murmur, click, rub or gallop GI: soft, non-tender; bowel sounds normal; no masses,  no organomegaly Extremities: extremities normal, atraumatic, no cyanosis or edema    ECOG PERFORMANCE STATUS: 1 - Symptomatic but completely ambulatory  Blood pressure (!) 141/80, pulse 71, temperature 97.8 F (36.6 C), temperature source Tympanic, weight 193 lb (87.5 kg), SpO2 98 %.  LABORATORY DATA: Lab Results  Component Value Date   WBC 10.6 (H) 04/11/2022   HGB 16.6 04/11/2022   HCT 45.0 04/11/2022   MCV 91.1 04/11/2022   PLT 208 04/11/2022      Chemistry      Component Value Date/Time   NA 138 04/11/2022 1015   NA 139 06/04/2017 0948   K 4.9 04/11/2022 1015   K 5.1 06/04/2017 0948   CL 106 04/11/2022 1015   CL 106 06/10/2012 0900   CO2 28 04/11/2022 1015   CO2 26 06/04/2017 0948   BUN 15 04/11/2022 1015   BUN 14.2 06/04/2017 0948   CREATININE 1.69 (H) 04/11/2022 1015   CREATININE 1.5 (H) 06/04/2017 0948      Component Value Date/Time   CALCIUM 9.8 04/11/2022 1015   CALCIUM 9.6 06/04/2017 0948   ALKPHOS 88 04/11/2022 1015   ALKPHOS 84 06/04/2017 0948   AST 20 04/11/2022 1015   AST 24 06/04/2017 0948   ALT 25 04/11/2022 1015   ALT 35 06/04/2017 0948   BILITOT 0.9 04/11/2022 1015   BILITOT 0.82 06/04/2017 0948       RADIOGRAPHIC STUDIES: No results found.   ASSESSMENT/PLAN:  This is a very pleasant 71 years old white male with metastatic non-small cell lung cancer, adenocarcinoma with positive EGFR mutation and he is currently on treatment with Tarceva since 2006.  This was a switch of 8 months ago to the generic Erlotinib and he is tolerating it well except for few  episodes of diarrhea and mild skin rash.  Since the switch to the generic Erlotinib, the patient has been complaining of worsening rash conjunctivitis as well as diarrhea that already improved. He also underwent SBRT to the enlarging left upper lobe lung nodule. His treatment was switched to erlotinib 100 mg p.o. twice daily 12 months ago. He has been tolerating this treatment well with no concerning adverse effect except for occasional skin rash and few episodes of diarrhea but significantly much better now.  Lab  work today is unremarkable except for the mild renal insufficiency. I recommended for the patient to continue his current treatment with Erlotinib with the same dose. I will see him back for follow-up visit in 3 months for evaluation and repeat CT scan of the chest as well as blood work. The patient was advised to call immediately if he has any other concerning symptoms in the interval.  All questions were answered. The patient knows to call the clinic with any problems, questions or concerns. We can certainly see the patient much sooner if necessary.  Disclaimer: This note was dictated with voice recognition software. Similar sounding words can inadvertently be transcribed and may not be corrected upon review.

## 2022-04-18 ENCOUNTER — Telehealth: Payer: Self-pay | Admitting: Internal Medicine

## 2022-04-18 NOTE — Telephone Encounter (Signed)
cheduled per 08/29 los, patient has been called and notified.  

## 2022-04-20 ENCOUNTER — Other Ambulatory Visit (HOSPITAL_COMMUNITY): Payer: Self-pay | Admitting: Cardiovascular Disease

## 2022-04-20 DIAGNOSIS — I714 Abdominal aortic aneurysm, without rupture, unspecified: Secondary | ICD-10-CM

## 2022-04-25 ENCOUNTER — Ambulatory Visit (HOSPITAL_COMMUNITY)
Admission: RE | Admit: 2022-04-25 | Discharge: 2022-04-25 | Disposition: A | Discharge status: Home / Self Care | Payer: Medicare Other | Source: Ambulatory Visit | Attending: Cardiology | Admitting: Cardiology

## 2022-04-25 ENCOUNTER — Other Ambulatory Visit (HOSPITAL_COMMUNITY): Payer: Self-pay | Admitting: Cardiovascular Disease

## 2022-04-25 DIAGNOSIS — I7143 Infrarenal abdominal aortic aneurysm, without rupture: Secondary | ICD-10-CM | POA: Diagnosis not present

## 2022-04-25 DIAGNOSIS — I714 Abdominal aortic aneurysm, without rupture, unspecified: Secondary | ICD-10-CM

## 2022-05-02 ENCOUNTER — Telehealth: Payer: Self-pay

## 2022-05-02 DIAGNOSIS — I714 Abdominal aortic aneurysm, without rupture, unspecified: Secondary | ICD-10-CM

## 2022-05-02 NOTE — Telephone Encounter (Signed)
-----   Message from Sherren Mocha, MD sent at 05/02/2022  5:16 AM EDT ----- Stable findings. 4 cm AAA and moderate BL iliac stenosis unchanged. 6 months follow-up recommended.

## 2022-05-02 NOTE — Telephone Encounter (Signed)
Spoke with patient who understands results and need to repeat study in 6 months. Repeat order placed at this time.

## 2022-06-26 ENCOUNTER — Telehealth: Payer: Self-pay | Admitting: Physician Assistant

## 2022-06-26 NOTE — Telephone Encounter (Signed)
Rescheduled 11/29 appointment to 12/05 per provider pal, patient has been called and notified.

## 2022-07-10 ENCOUNTER — Inpatient Hospital Stay: Payer: Medicare Other | Attending: Internal Medicine

## 2022-07-10 ENCOUNTER — Other Ambulatory Visit: Payer: Self-pay

## 2022-07-10 ENCOUNTER — Ambulatory Visit (HOSPITAL_COMMUNITY)
Admission: RE | Admit: 2022-07-10 | Discharge: 2022-07-10 | Disposition: A | Discharge status: Home / Self Care | Payer: Medicare Other | Source: Ambulatory Visit | Attending: Internal Medicine | Admitting: Internal Medicine

## 2022-07-10 DIAGNOSIS — C3490 Malignant neoplasm of unspecified part of unspecified bronchus or lung: Secondary | ICD-10-CM | POA: Insufficient documentation

## 2022-07-10 DIAGNOSIS — C3412 Malignant neoplasm of upper lobe, left bronchus or lung: Secondary | ICD-10-CM | POA: Insufficient documentation

## 2022-07-10 LAB — CMP (CANCER CENTER ONLY)
ALT: 14 U/L (ref 0–44)
AST: 15 U/L (ref 15–41)
Albumin: 4 g/dL (ref 3.5–5.0)
Alkaline Phosphatase: 96 U/L (ref 38–126)
Anion gap: 6 (ref 5–15)
BUN: 16 mg/dL (ref 8–23)
CO2: 27 mmol/L (ref 22–32)
Calcium: 9.3 mg/dL (ref 8.9–10.3)
Chloride: 105 mmol/L (ref 98–111)
Creatinine: 1.78 mg/dL — ABNORMAL HIGH (ref 0.61–1.24)
GFR, Estimated: 40 mL/min — ABNORMAL LOW (ref >60)
Glucose, Bld: 175 mg/dL — ABNORMAL HIGH (ref 70–99)
Potassium: 3.9 mmol/L (ref 3.5–5.1)
Sodium: 138 mmol/L (ref 135–145)
Total Bilirubin: 1.8 mg/dL — ABNORMAL HIGH (ref 0.3–1.2)
Total Protein: 6.7 g/dL (ref 6.5–8.1)

## 2022-07-10 LAB — CBC WITH DIFFERENTIAL (CANCER CENTER ONLY)
Abs Immature Granulocytes: 0.04 10*3/uL (ref 0.00–0.07)
Basophils Absolute: 0.1 10*3/uL (ref 0.0–0.1)
Basophils Relative: 1 %
Eosinophils Absolute: 0.3 10*3/uL (ref 0.0–0.5)
Eosinophils Relative: 3 %
HCT: 43.3 % (ref 39.0–52.0)
Hemoglobin: 15.5 g/dL (ref 13.0–17.0)
Immature Granulocytes: 0 %
Lymphocytes Relative: 14 %
Lymphs Abs: 1.5 10*3/uL (ref 0.7–4.0)
MCH: 32.8 pg (ref 26.0–34.0)
MCHC: 35.8 g/dL (ref 30.0–36.0)
MCV: 91.5 fL (ref 80.0–100.0)
Monocytes Absolute: 1 10*3/uL (ref 0.1–1.0)
Monocytes Relative: 9 %
Neutro Abs: 8 10*3/uL — ABNORMAL HIGH (ref 1.7–7.7)
Neutrophils Relative %: 73 %
Platelet Count: 212 10*3/uL (ref 150–400)
RBC: 4.73 MIL/uL (ref 4.22–5.81)
RDW: 14.4 % (ref 11.5–15.5)
WBC Count: 10.9 10*3/uL — ABNORMAL HIGH (ref 4.0–10.5)
nRBC: 0 % (ref 0.0–0.2)

## 2022-07-12 ENCOUNTER — Ambulatory Visit: Payer: Medicare Other | Admitting: Internal Medicine

## 2022-07-16 NOTE — Progress Notes (Unsigned)
[facility] OFFICE PROGRESS NOTE  [name], DO [address] [place] [address]  DIAGNOSIS: Metastatic non-small cell lung cancer, adenocarcinoma initially diagnosed in December of 2004   PRIOR THERAPY: 1) status post course of concurrent chemoradiation under the care of Dr. Truddie Coco. 2) status post left upper lobe wedge resection on 01/10/2005 for recurrent disease in the left lung.  CURRENT THERAPY: Erlotinib 150 mg by mouth daily started in 2006. Starting August 2022 the patient will be on erlotinib 100 mg p.o. daily.   INTERVAL HISTORY: [name] 71 y.o. male returns to the clinic for a follow up visit. The patient is feeling well today without any concerning complaints. The patient continues to tolerate treatment with oral treatment with Erlotinib well without any adverse effects. He has been on this since 2006. [place]? Falls or injuries? Heavy lifting? Denies any fever, chills, night sweats, or weight loss. Denies any chest pain, shortness of breath, cough, or hemoptysis. Denies any nausea, vomiting, diarrhea, or constipation. Denies any headache or visual changes. Denies any rashes or skin changes. The patient recently had a restaging CT scan performed. The patient is here today for evaluation and to review his scan.    MEDICAL HISTORY: Past Medical History:  Diagnosis Date   AAA (abdominal aortic aneurysm) ([place])    a. 3.4 x 3.5 on scan 02/28/13.   Allergic reaction to contrast dye    Atherosclerosis    CAD (coronary artery disease)    a. stenting of RCA/LCx 2004. b. [place] 08/2013:  s/p DES to LCx for severe ISR.   Diabetes mellitus    Diverticulosis    Emphysema lung ([place])    Emphysema of lung ([place])    Encounter for therapeutic drug monitoring 06/06/2016   GERD (gastroesophageal reflux disease)    Hemorrhoids    Hepatic steatosis    History of radiation therapy 09/07/20-09/22/20   SBRT left lung ; Dr Gery Pray   HTN (hypertension)     Hyperlipidemia    Kidney stone    Lung cancer [facility]) dx'd 2004   chemo/xrt comp; tarceva ongoing- January 2022 radiation    ALLERGIES:  is allergic to iohexol, ivp dye [iodinated contrast media], morphine and related, other, and povidone iodine.  MEDICATIONS:  Current Outpatient Medications  Medication Sig Dispense Refill   ACCU-CHEK AVIVA PLUS test strip 1 each daily.     aspirin EC 81 MG tablet Take 81 mg by mouth daily. Swallow whole.     atorvastatin (LIPITOR) 40 MG tablet Take 40 mg by mouth daily.     clopidogrel (PLAVIX) 75 MG tablet TAKE 1 TABLET (75 MG TOTAL) BY MOUTH DAILY. 90 tablet 2   cyanocobalamin 1000 MCG tablet Take 1,000 mcg by mouth daily.     DULoxetine (CYMBALTA) 60 MG capsule Take 60 mg by mouth daily. Per patient taking 120 mg once daily     erlotinib (TARCEVA) 100 MG tablet Take 1 tablet (100 mg total) by mouth daily. Take on an empty stomach 1 hour before meals or 2 hours after. 90 tablet 1   famotidine (PEPCID) 40 MG tablet TAKE 1 TABLET BY MOUTH TWICE A DAY 180 tablet 2   ferrous sulfate 324 MG TBEC Take 324 mg by mouth.     glimepiride (AMARYL) 2 MG tablet Take 2 mg by mouth every morning.     isosorbide mononitrate (IMDUR) 60 MG 24 hr tablet TAKE 1 TABLET BY MOUTH EVERY DAY 90 tablet 2   JARDIANCE  25 MG TABS tablet Take 25 mg by mouth daily.     Lancets (FREESTYLE) lancets 1 each daily.     losartan (COZAAR) 25 MG tablet TAKE 1 TABLET BY MOUTH EVERY DAY FOR 90 DAYS     metoprolol tartrate (LOPRESSOR) 50 MG tablet Take 1 tablet (50 mg total) by mouth 2 (two) times daily. 180 tablet 3   nitroGLYCERIN (NITROSTAT) 0.4 MG SL tablet Place 1 tablet (0.4 mg total) under the tongue every 5 (five) minutes as needed for chest pain (up to 3 doses). 25 tablet 3   TRADJENTA 5 MG TABS tablet Take 5 mg by mouth daily.     zaleplon (SONATA) 10 MG capsule Take 20 mg by mouth at bedtime.  5   No current facility-administered medications for this visit.    SURGICAL HISTORY:   Past Surgical History:  Procedure Laterality Date   ANGIOPLASTY / STENTING FEMORAL  08/14/2013   2004-groin   CATARACT EXTRACTION     COLONOSCOPY     LEFT HEART CATHETERIZATION WITH CORONARY ANGIOGRAM N/A 09/08/2013   Procedure: LEFT HEART CATHETERIZATION WITH CORONARY ANGIOGRAM;  Surgeon: Blane Ohara, MD;  Location: [facility] CATH LAB;  Service: Cardiovascular;  Laterality: N/A;   left partial lobectomy     WISDOM TOOTH EXTRACTION      REVIEW OF SYSTEMS:   Review of Systems  Constitutional: Negative for appetite change, chills, fatigue, fever and unexpected weight change.  HENT:   Negative for mouth sores, nosebleeds, sore throat and trouble swallowing.   Eyes: Negative for eye problems and icterus.  Respiratory: Negative for cough, hemoptysis, shortness of breath and wheezing.   Cardiovascular: Negative for chest pain and leg swelling.  Gastrointestinal: Negative for abdominal pain, constipation, diarrhea, nausea and vomiting.  Genitourinary: Negative for bladder incontinence, difficulty urinating, dysuria, frequency and hematuria.   Musculoskeletal: Negative for back pain, gait problem, neck pain and neck stiffness.  Skin: Negative for itching and rash.  Neurological: Negative for dizziness, extremity weakness, gait problem, headaches, light-headedness and seizures.  Hematological: Negative for adenopathy. Does not bruise/bleed easily.  Psychiatric/Behavioral: Negative for confusion, depression and sleep disturbance. The patient is not nervous/anxious.     PHYSICAL EXAMINATION:  There were no vitals taken for this visit.  ECOG PERFORMANCE STATUS: {CHL ONC ECOG [id number]  Physical Exam  Constitutional: Oriented to person, place, and time and well-developed, well-nourished, and in no distress. No distress.  HENT:  Head: Normocephalic and atraumatic.  Mouth/Throat: Oropharynx is clear and moist. No oropharyngeal exudate.  Eyes: Conjunctivae are normal. Right eye exhibits no  discharge. Left eye exhibits no discharge. No scleral icterus.  Neck: Normal range of motion. Neck supple.  Cardiovascular: Normal rate, regular rhythm, normal heart sounds and intact distal pulses.   Pulmonary/Chest: Effort normal and breath sounds normal. No respiratory distress. No wheezes. No rales.  Abdominal: Soft. Bowel sounds are normal. Exhibits no distension and no mass. There is no tenderness.  Musculoskeletal: Normal range of motion. Exhibits no edema.  Lymphadenopathy:    No cervical adenopathy.  Neurological: Alert and oriented to person, place, and time. Exhibits normal muscle tone. Gait normal. Coordination normal.  Skin: Skin is warm and dry. No rash noted. Not diaphoretic. No erythema. No pallor.  Psychiatric: Mood, memory and judgment normal.  Vitals reviewed.  LABORATORY DATA: Lab Results  Component Value Date   WBC 10.9 (H) 07/10/2022   HGB 15.5 07/10/2022   HCT 43.3 07/10/2022   MCV 91.5 07/10/2022   PLT 212 07/10/2022  Chemistry      Component Value Date/Time   NA 138 07/10/2022 1134   NA 139 06/04/2017 0948   K 3.9 07/10/2022 1134   K 5.1 06/04/2017 0948   CL 105 07/10/2022 1134   CL 106 06/10/2012 0900   CO2 27 07/10/2022 1134   CO2 26 06/04/2017 0948   BUN 16 07/10/2022 1134   BUN 14.2 06/04/2017 0948   CREATININE 1.78 (H) 07/10/2022 1134   CREATININE 1.5 (H) 06/04/2017 0948      Component Value Date/Time   CALCIUM 9.3 07/10/2022 1134   CALCIUM 9.6 06/04/2017 0948   ALKPHOS 96 07/10/2022 1134   ALKPHOS 84 06/04/2017 0948   AST 15 07/10/2022 1134   AST 24 06/04/2017 0948   ALT 14 07/10/2022 1134   ALT 35 06/04/2017 0948   BILITOT 1.8 (H) 07/10/2022 1134   BILITOT 0.82 06/04/2017 0948       RADIOGRAPHIC STUDIES:  CT Chest Wo Contrast  Result Date: 07/11/2022 CLINICAL DATA:  Non-small cell lung cancer, staging. * Tracking Code: BO * EXAM: CT CHEST WITHOUT CONTRAST TECHNIQUE: Multidetector CT imaging of the chest was performed  following the standard protocol without IV contrast. RADIATION DOSE REDUCTION: This exam was performed according to the departmental dose-optimization program which includes automated exposure control, adjustment of the mA and/or kV according to patient size and/or use of iterative reconstruction technique. COMPARISON:  Chest CT 01/05/2022 and 07/08/2021 FINDINGS: Cardiovascular: Atherosclerosis of the aorta, great vessels and coronary arteries. No acute vascular findings on noncontrast imaging. There are calcifications of the aortic valve. The heart size is normal. There is no pericardial effusion. Mediastinum/Nodes: There are no enlarged mediastinal, hilar or axillary lymph nodes.Hilar assessment is limited by the lack of intravenous contrast, although the hilar contours appear unchanged. The thyroid gland, trachea and esophagus demonstrate no significant findings. Lungs/Pleura: No pleural effusion or pneumothorax. Mild chronic pleural thickening posteriorly on the right. Again demonstrated is moderate centrilobular and paraseptal emphysema. There are stable postsurgical changes from left upper lobe wedge resection with stable nodularity along the suture line measuring 1.5 x 0.9 cm on image 67/7. Stable band like scarring in the right lower lobe attributed to prior radiation therapy. Mild perifissural nodularity on the right is stable. No new or enlarging pulmonary nodules. Upper abdomen: The visualized upper abdomen appears stable without acute findings. Probable nonobstructing bilateral renal calculi. A low-density lesion posteriorly in the right hepatic lobe on image 152/2 is unchanged, likely a cyst based on stability. Musculoskeletal/Chest wall: There is chronic expansion of the right 7th and 8th ribs posteriorly which appears unchanged. There is a new mildly displaced acute fracture of the right 6th rib posterolaterally. This demonstrates no pathologic features. No evidence of osseous metastatic disease.  IMPRESSION: 1. New mildly displaced acute fracture of the right 6th rib posterolaterally without pathologic features. No associated pneumothorax or significant pleural effusion. 2. Otherwise stable postoperative chest CT status post left upper lobe wedge resection and right lower lobe radiation therapy. No evidence of local recurrence or metastatic disease. 3. Aortic Atherosclerosis (ICD10-I70.0) and Emphysema (ICD10-J43.9). Electronically Signed   By: Richardean Sale M.D.   On: 07/11/2022 11:33     ASSESSMENT/PLAN:  This is a very pleasant 71 year old Caucasian male with metastatic non-small cell lung cancer, adenocarcinoma with positive EGFR mutation and he is currently on treatment with Tarceva since 2006.  This was a switch about *** months ago to the generic Erlotinib and he is tolerating it well except for  few episodes of diarrhea and mild skin rash.  Since the switch to the generic Erlotinib, the patient has been complaining of worsening rash conjunctivitis as well as diarrhea that already improved***. He also underwent SBRT to the enlarging left upper lobe lung nodule. His treatment was switched to erlotinib 100 mg p.o. twice daily in ***2022.   The patient recently had a restaging CT scan performed. Dr. Julien Nordmann personally and independently reviewed the scan and discussed the results with the patient. The scan showed ***. Dr. Julien Nordmann recommends that he continue on the same treatment at the same dose.   Rib fracture?   We will see him back for a follow up visit in *** months for lab work.   The patient was advised to call immediately if he has any concerning symptoms in the interval. The patient voices understanding of current disease status and treatment options and is in agreement with the current care plan. All questions were answered. The patient knows to call the clinic with any problems, questions or concerns. We can certainly see the patient much sooner if necessary        No  orders of the defined types were placed in this encounter.    I spent {CHL ONC TIME VISIT - UGQBV:[id number]} counseling the patient face to face. The total time spent in the appointment was {CHL ONC TIME VISIT - CMKLK:[id number]}.  Evan Osburn L Linsi Humann, PA-C 07/16/22

## 2022-07-18 ENCOUNTER — Inpatient Hospital Stay: Payer: Medicare Other | Attending: Physician Assistant | Admitting: Physician Assistant

## 2022-07-18 ENCOUNTER — Other Ambulatory Visit: Payer: Self-pay

## 2022-07-18 VITALS — BP 140/61 | HR 70 | Temp 97.5°F | Resp 18 | Ht 73.0 in | Wt 189.9 lb

## 2022-07-18 DIAGNOSIS — C3491 Malignant neoplasm of unspecified part of right bronchus or lung: Secondary | ICD-10-CM | POA: Diagnosis not present

## 2022-07-18 DIAGNOSIS — C3412 Malignant neoplasm of upper lobe, left bronchus or lung: Secondary | ICD-10-CM | POA: Diagnosis present

## 2022-07-18 DIAGNOSIS — Z9221 Personal history of antineoplastic chemotherapy: Secondary | ICD-10-CM | POA: Diagnosis not present

## 2022-07-18 DIAGNOSIS — Z923 Personal history of irradiation: Secondary | ICD-10-CM | POA: Diagnosis not present

## 2022-07-18 DIAGNOSIS — Z79899 Other long term (current) drug therapy: Secondary | ICD-10-CM | POA: Diagnosis not present

## 2022-08-15 ENCOUNTER — Encounter: Payer: Self-pay | Admitting: Allergy and Immunology

## 2022-08-15 ENCOUNTER — Ambulatory Visit (INDEPENDENT_AMBULATORY_CARE_PROVIDER_SITE_OTHER): Payer: Medicare Other | Admitting: Allergy and Immunology

## 2022-08-15 ENCOUNTER — Other Ambulatory Visit: Payer: Self-pay

## 2022-08-15 VITALS — BP 128/78 | HR 108 | Temp 97.6°F | Resp 18 | Ht 73.0 in | Wt 192.8 lb

## 2022-08-15 DIAGNOSIS — H10503 Unspecified blepharoconjunctivitis, bilateral: Secondary | ICD-10-CM

## 2022-08-15 DIAGNOSIS — C44509 Unspecified malignant neoplasm of skin of other part of trunk: Secondary | ICD-10-CM | POA: Diagnosis not present

## 2022-08-15 DIAGNOSIS — J3089 Other allergic rhinitis: Secondary | ICD-10-CM

## 2022-08-15 DIAGNOSIS — R442 Other hallucinations: Secondary | ICD-10-CM

## 2022-08-15 MED ORDER — DOXYCYCLINE MONOHYDRATE 100 MG PO CAPS: 100.0000 mg | ORAL_CAPSULE | Freq: Two times a day (BID) | ORAL | 0 refills | Status: DC

## 2022-08-15 MED ORDER — RYALTRIS 665-25 MCG/ACT NA SUSP: 2.0000 | Freq: Two times a day (BID) | NASAL | 5 refills | Status: DC

## 2022-08-15 MED ORDER — SYSTANE COMPLETE PF 0.6 % OP SOLN: 1.0000 [drp] | OPHTHALMIC | 5 refills | Status: DC | PRN

## 2022-08-15 MED ORDER — LEVOCETIRIZINE DIHYDROCHLORIDE 5 MG PO TABS: 5.0000 mg | ORAL_TABLET | Freq: Every day | ORAL | 5 refills | Status: DC | PRN

## 2022-08-15 MED ORDER — OLOPATADINE HCL 0.2 % OP SOLN: 1.0000 [drp] | OPHTHALMIC | 5 refills | Status: DC

## 2022-08-15 MED ORDER — LOTEPREDNOL ETABONATE 0.5 % OP SUSP: 1.0000 [drp] | Freq: Every day | OPHTHALMIC | 5 refills | Status: DC

## 2022-08-15 NOTE — Progress Notes (Signed)
[name] - [place] [facility] [place] [facility] [place] [facility] [place]   Dear [name],  Thank you for referring [name] to the [facility] on 08/15/2022.   Below is a summation of this patient's evaluation and recommendations.  Thank you for your referral. I will keep you informed about this patient's response to treatment.   If you have any questions please do not hesitate to contact me.   Sincerely,  Jiles Prows, MD Allergy / Immunology Medicine [place]   ______________________________________________________________________    NEW PATIENT NOTE  Referring Provider: [name], DO Primary Provider: [name], DO Date of office visit: 08/15/2022    Subjective:   Chief Complaint:  [name] (DOB: 1950-12-05) is a 72 y.o. male who presents to the clinic on 08/15/2022 with a chief complaint of Consult, Allergic Rhinitis  (X 5 years  Seasonal/Perennial Allergic rhinitis - Dog - itch eyes   ), and Eczema (Never formally Dx) .     HPI: [name] presents to this clinic in evaluation of allergic problems.  He states that for the past 5 years with a progressive pattern he has been having issues with his eyes and nose.  His eyes are red and itchy and swollen.  He has been to see an eye doctor about 3 months ago and has been given multiple agents that have not really helped him very much.  He does rub his eyes.  He believes that exposure to his dog may be precipitating this issue.  He also has nasal congestion and lots of rhinorrhea and some intermittent sneezing.  He thinks that temperature changes and exposure to his dog precipitate this issue.  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  He does not have anosmia.  This airway issue still remains active even though he has been using Flonase on a daily basis the past 2 months.  He is also had a problem with his skin.  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  He also was treated with chemotherapy for that cancer in 2024.  But he is also noticed a new spot that has come up on his left chest over the course of the past year.  This is a nonpruritic lesion.  Past Medical History:  Diagnosis Date  . AAA (abdominal aortic aneurysm) ([facility])    a. 3.4 x 3.5 on scan 02/28/13.  . Allergic reaction to contrast dye   . Atherosclerosis   . CAD (coronary artery disease)    a. stenting of RCA/LCx 2004. b. [place] 08/2013:  s/p DES to LCx for severe ISR.  . Diabetes mellitus   . Diverticulosis   . Emphysema lung ([place])   . Emphysema of lung ([place])   . Encounter for therapeutic drug monitoring 06/06/2016  . GERD (gastroesophageal reflux disease)   . Hemorrhoids   . Hepatic steatosis   . History of radiation therapy 09/07/20-09/22/20   SBRT left lung ; Dr Gery Pray  . HTN (hypertension)   . Hyperlipidemia   . Kidney stone   . Lung cancer [facility]) dx'd 2004   chemo/xrt comp; tarceva ongoing- January 2022 radiation    Past Surgical History:  Procedure Laterality Date  . ANGIOPLASTY / STENTING FEMORAL  08/14/2013  2004-groin  . CATARACT EXTRACTION    . COLONOSCOPY    . LEFT HEART CATHETERIZATION WITH CORONARY ANGIOGRAM N/A 09/08/2013   Procedure: LEFT HEART CATHETERIZATION WITH CORONARY ANGIOGRAM;  Surgeon: Blane Ohara, MD;  Location: [facility] CATH LAB;  Service: Cardiovascular;  Laterality: N/A;  . left partial lobectomy    . WISDOM TOOTH EXTRACTION      Allergies as of 08/15/2022       Reactions   Iohexol Other (See Comments)    Code: HIVES, Desc: pt recieves 50mg  benadryl 1 hour prior to scan   Ivp Dye [iodinated Contrast Media]    Other reaction(s): Unknown   Morphine And Related Anxiety   hallucinations   Other Rash   chlorascrub when starting IV   Povidone Iodine Rash   Other  reaction(s): heated rash        Medication List    Accu-Chek Aviva Plus test strip Generic drug: glucose blood 1 each daily.   aspirin EC 81 MG tablet Take 81 mg by mouth daily. Swallow whole.   atorvastatin 40 MG tablet Commonly known as: LIPITOR Take 40 mg by mouth daily.   clopidogrel 75 MG tablet Commonly known as: PLAVIX TAKE 1 TABLET (75 MG TOTAL) BY MOUTH DAILY.   cyanocobalamin 1000 MCG tablet Take 1,000 mcg by mouth daily.   erlotinib 100 MG tablet Commonly known as: TARCEVA Take 1 tablet (100 mg total) by mouth daily. Take on an empty stomach 1 hour before meals or 2 hours after.   famotidine 40 MG tablet Commonly known as: PEPCID TAKE 1 TABLET BY MOUTH TWICE A DAY   freestyle lancets 1 each daily.   glimepiride 2 MG tablet Commonly known as: AMARYL Take 4 mg by mouth every morning.   Jardiance 25 MG Tabs tablet Generic drug: empagliflozin Take 25 mg by mouth daily.   levocetirizine 5 MG tablet Commonly known as: XYZAL Take 1 tablet (5 mg total) by mouth daily as needed (Can take an extra dose during flare ups.). Started by: Jiles Prows, MD   levothyroxine 25 MCG tablet Commonly known as: SYNTHROID Take 25 mcg by mouth every morning.   losartan 25 MG tablet Commonly known as: COZAAR TAKE 1 TABLET BY MOUTH EVERY DAY FOR 90 DAYS   loteprednol 0.5 % ophthalmic suspension Commonly known as: Lotemax Place 1 drop into both eyes daily. Started by: Jiles Prows, MD   metoprolol tartrate 50 MG tablet Commonly known as: LOPRESSOR Take 1 tablet (50 mg total) by mouth 2 (two) times daily.   nitroGLYCERIN 0.4 MG SL tablet Commonly known as: Nitrostat Place 1 tablet (0.4 mg total) under the tongue every 5 (five) minutes as needed for chest pain (up to 3 doses).   nystatin-triamcinolone ointment Commonly known as: MYCOLOG Apply 1 Application topically as needed.   Olopatadine HCl 0.2 % Soln Commonly known as: Pataday Place 1 drop into both  eyes 1 day or 1 dose. Started by: Jiles Prows, MD   Tradjenta 5 MG Tabs tablet Generic drug: linagliptin Take 5 mg by mouth daily.   zaleplon 10 MG capsule Commonly known as: SONATA Take 20 mg by mouth at bedtime.        Review of systems negative except as noted in HPI / PMHx or noted below:  Review of Systems  Constitutional: Negative.   HENT: Negative.    Eyes: Negative.   Respiratory: Negative.    Cardiovascular: Negative.   Gastrointestinal: Negative.   Genitourinary: Negative.  Musculoskeletal: Negative.   Skin: Negative.   Neurological: Negative.   Endo/Heme/Allergies: Negative.   Psychiatric/Behavioral: Negative.      Family History  Problem Relation Age of Onset  . Heart disease Sister   . Diabetes Sister        oral cancer  . Ovarian cancer Sister   . Colon cancer Neg Hx   . Esophageal cancer Neg Hx   . Rectal cancer Neg Hx   . Stomach cancer Neg Hx     Social History   Socioeconomic History  . Marital status: Married    Spouse name: Not on file  . Number of children: 1  . Years of education: Not on file  . Highest education level: Not on file  Occupational History  . Occupation: Retired-disability  Tobacco Use  . Smoking status: Former    Types: Cigarettes    Quit date: 08/14/2002    Years since quitting: 20.0  . Smokeless tobacco: Never  Vaping Use  . Vaping Use: Never used  Substance and Sexual Activity  . Alcohol use: No  . Drug use: No  . Sexual activity: Not on file  Other Topics Concern  . Not on file  Social History Narrative  . Not on file   Social Determinants of Health   Financial Resource Strain: Not on file  Food Insecurity: Not on file  Transportation Needs: Not on file  Physical Activity: Not on file  Stress: Not on file  Social Connections: Not on file  Intimate Partner Violence: Not on file    Environmental and Social history  Lives in a house with a dry environment, dog located inside the household,  carpet in the bedroom, no plastic on the bed, no plastic on the pillow, no smoking ongoing with inside the household.  Objective:   Vitals:   08/15/22 0948  BP: 128/78  Pulse: (!) 108  Resp: 18  Temp: 97.6 F (36.4 C)  SpO2: 99%   Height: 6\' 1"  (185.4 cm) Weight: 192 lb 12.8 oz (87.5 kg)  Physical Exam Constitutional:      Appearance: He is not diaphoretic.  HENT:     Head: Normocephalic.     Right Ear: Tympanic membrane, ear canal and external ear normal.     Left Ear: Tympanic membrane, ear canal and external ear normal.     Nose: Nose normal. No mucosal edema or rhinorrhea.     Mouth/Throat:     Pharynx: Uvula midline. No oropharyngeal exudate.  Eyes:     Conjunctiva/sclera: Conjunctivae normal.  Neck:     Thyroid: No thyromegaly.     Trachea: Trachea normal. No tracheal tenderness or tracheal deviation.  Cardiovascular:     Rate and Rhythm: Normal rate and regular rhythm.     Heart sounds: Normal heart sounds, S1 normal and S2 normal. No murmur heard. Pulmonary:     Effort: No respiratory distress.     Breath sounds: Normal breath sounds. No stridor. No wheezing or rales.  Lymphadenopathy:     Head:     Right side of head: No tonsillar adenopathy.     Left side of head: No tonsillar adenopathy.     Cervical: No cervical adenopathy.  Skin:    Findings: Rash (Circumferential high per pigmented lichenified slight scale lesion left anterior chest, hyperpigmented lichenified lesion approximately 15 cm diameter right posterior rear back) present. No erythema.     Nails: There is no clubbing.  Neurological:     Mental Status: He  is alert.    Diagnostics: Allergy skin tests were performed.   Spirometry was performed and demonstrated an FEV1 of *** @ *** % of predicted. FEV1/FVC = ***  The patient had an Asthma Control Test with the following results:  .     Assessment and Plan:    No diagnosis found.  Patient Instructions   1.  Return for skin testing. Do  not rub / touch eyes  2.  Treat and prevent inflammation of airway:   A. Ryaltris - 2 sprays each nostril 2 times per day  3.  Treat and prevent inflammation of the eyes:   A. Pataday - 1 drop each eye 1 time per day  B. Lotomax - 1 drop each eye 1 time per day  4.  Treat secondary bacterial infection of eyes:   A. Doxycycline 100 mg - 1 tablet 2 times per day  5.  If needed:   A. OTC antihistamine  B. OTC Systane eye drops   6.  Immunotherapy???  7.  Visit with dermatologist for skin by Shirley Muscat, MD Allergy / [facility]

## 2022-08-15 NOTE — Patient Instructions (Addendum)
  1.  Return for skin testing. Do not rub / touch eyes  2.  Treat and prevent inflammation of airway:   A. Ryaltris - 2 sprays each nostril 2 times per day  3.  Treat and prevent inflammation of the eyes:   A. Pataday - 1 drop each eye 1 time per day  B. Lotomax - 1 drop each eye 1 time per day  4.  Treat secondary bacterial infection of eyes:   A. Doxycycline 100 mg - 1 tablet 2 times per day  5.  If needed:   A. OTC antihistamine  B. OTC Systane eye drops   6.  Immunotherapy???  7.  Visit with dermatologist for skin by [place]

## 2022-08-16 ENCOUNTER — Encounter: Payer: Self-pay | Admitting: Allergy and Immunology

## 2022-08-22 ENCOUNTER — Ambulatory Visit: Payer: Medicare Other | Admitting: Allergy and Immunology

## 2022-08-22 VITALS — BP 138/82 | HR 81 | Temp 98.3°F | Resp 16 | Ht 73.0 in | Wt 193.0 lb

## 2022-08-22 DIAGNOSIS — J3089 Other allergic rhinitis: Secondary | ICD-10-CM

## 2022-08-22 NOTE — Progress Notes (Signed)
[name] returns to this clinic to have skin testing performed.  He demonstrated hypersensitivity against dog.  He will perform allergen avoidance measures as best as possible continue on the plan provided during his last visit and we will see him back in this clinic in 4 weeks to assess his response to this approach.

## 2022-09-11 ENCOUNTER — Other Ambulatory Visit: Payer: Self-pay | Admitting: Allergy and Immunology

## 2022-09-11 NOTE — Telephone Encounter (Signed)
Is this refill appropriate for the patient?

## 2022-09-13 ENCOUNTER — Telehealth: Payer: Self-pay | Admitting: Allergy and Immunology

## 2022-09-13 ENCOUNTER — Other Ambulatory Visit: Payer: Self-pay | Admitting: *Deleted

## 2022-09-13 MED ORDER — DOXYCYCLINE MONOHYDRATE 100 MG PO CAPS: 100.0000 mg | ORAL_CAPSULE | Freq: Two times a day (BID) | ORAL | 1 refills | Status: DC

## 2022-09-13 NOTE — Telephone Encounter (Signed)
Patient called and said that dr Neldon Mc had given him doxycyline and he has 2 pills left and wanted to know if he is suppose get a refill.his appointment is on 09/26/2022...// [phone]

## 2022-09-13 NOTE — Telephone Encounter (Signed)
Refills have been sent in. Attempted to call patient to advise, will need to call again.

## 2022-09-13 NOTE — Telephone Encounter (Signed)
Did you want this patient to be on Doxycycline for a long period of time?

## 2022-09-14 NOTE — Telephone Encounter (Signed)
Called and spoke with the patients wife and advised. Patients wife verbalized understanding.

## 2022-09-26 ENCOUNTER — Ambulatory Visit: Payer: Medicare Other | Admitting: Allergy and Immunology

## 2022-09-26 ENCOUNTER — Other Ambulatory Visit: Payer: Self-pay

## 2022-09-26 ENCOUNTER — Encounter: Payer: Self-pay | Admitting: Allergy and Immunology

## 2022-09-26 VITALS — BP 134/80 | HR 85 | Temp 98.0°F | Resp 16 | Ht 73.0 in | Wt 193.0 lb

## 2022-09-26 DIAGNOSIS — H10503 Unspecified blepharoconjunctivitis, bilateral: Secondary | ICD-10-CM | POA: Diagnosis not present

## 2022-09-26 DIAGNOSIS — J3089 Other allergic rhinitis: Secondary | ICD-10-CM

## 2022-09-26 DIAGNOSIS — L42 Pityriasis rosea: Secondary | ICD-10-CM | POA: Diagnosis not present

## 2022-09-26 NOTE — Patient Instructions (Addendum)
  1.  Visit with dermatology : Pityriasis rosea ???  2.  Treat and prevent inflammation of airway:   A. DECREASE Ryaltris - 2 sprays each nostril 1-2 times per day  3.  Treat and prevent inflammation of the eyes:   A. DECREASE Pataday - 1 drop each eye 1 time per day  B. DECREASE Lotomax - 1 drop each eye 1 time per day  C. Do not rub / touch eyes  4.  Treat secondary bacterial infection of eyes:   A. DECREASE Doxycycline 100 mg - 1 tablet 1 time per day  5.  If needed:   A. OTC antihistamine  B. OTC Systane eye drops   6.  Return in 8 weeks . Taper medications ???

## 2022-09-26 NOTE — Progress Notes (Unsigned)
[name] [place] [facility] [place]   Follow-up Note  Referring Provider: [name], DO Primary Provider: [name], DO Date of Office Visit: 09/26/2022  Subjective:   [name] (DOB: 1951-06-22) is a 72 y.o. male who returns to the [facility] on 09/26/2022 in re-evaluation of the following:  HPI: [name] return to this clinic in reevaluation of allergic rhinoconjunctivitis, blepharoconjunctivitis, phantosmia, and a dermatitis.  I last saw him in this clinic for skin testing on 22 August 2022 at which point in time he demonstrated hypersensitivity against dog.  He is significantly improved regarding his upper airway and his eyes.  He is at least 75 to 80% improved with both areas.  He does not have any funny smells anymore.  He is not congested.  His eyes are not irritating and itchy and no longer red.  He has been consistently using a nasal antihistamine/steroid and continues to use a topical steroid and Pataday on a consistent basis along with doxycycline twice a day.  His dermatitis has changed.  The big lesion that he had in his central chest appears to be less prevalent and he now has developed other areas across his body that have an oval shape.  He has an appointment to see dermatology in April.  Allergies as of 09/26/2022       Reactions   Iohexol Other (See Comments)    Code: HIVES, Desc: pt recieves 50mg  benadryl 1 hour prior to scan   Ivp Dye [iodinated Contrast Media]    Other reaction(s): Unknown   Morphine And Related Anxiety   hallucinations   Other Rash   chlorascrub when starting IV   Povidone Iodine Rash   Other reaction(s): heated rash        Medication List    Accu-Chek Aviva Plus test strip Generic drug: glucose blood 1 each daily.   aspirin EC 81 MG tablet Take 81 mg by mouth daily. Swallow whole.   atorvastatin 40 MG tablet Commonly known as: LIPITOR Take 40 mg by mouth daily.   clopidogrel  75 MG tablet Commonly known as: PLAVIX TAKE 1 TABLET (75 MG TOTAL) BY MOUTH DAILY.   cyanocobalamin 1000 MCG tablet Take 1,000 mcg by mouth daily.   doxycycline 100 MG capsule Commonly known as: MONODOX Take 1 capsule (100 mg total) by mouth 2 (two) times daily.   erlotinib 100 MG tablet Commonly known as: TARCEVA Take 1 tablet (100 mg total) by mouth daily. Take on an empty stomach 1 hour before meals or 2 hours after.   famotidine 40 MG tablet Commonly known as: PEPCID TAKE 1 TABLET BY MOUTH TWICE A DAY   freestyle lancets 1 each daily.   glimepiride 2 MG tablet Commonly known as: AMARYL Take 4 mg by mouth every morning.   Jardiance 25 MG Tabs tablet Generic drug: empagliflozin Take 25 mg by mouth daily.   levocetirizine 5 MG tablet Commonly known as: XYZAL Take 1 tablet (5 mg total) by mouth daily as needed (Can take an extra dose during flare ups.).   levothyroxine 25 MCG tablet Commonly known as: SYNTHROID Take 25 mcg by mouth every morning.   losartan 25 MG tablet Commonly known as: COZAAR TAKE 1 TABLET BY MOUTH EVERY DAY FOR 90 DAYS   loteprednol 0.5 % ophthalmic suspension Commonly known as: Lotemax Place 1 drop into both eyes daily.   metoprolol tartrate 50 MG tablet Commonly known as: LOPRESSOR Take 1 tablet (  50 mg total) by mouth 2 (two) times daily.   nitroGLYCERIN 0.4 MG SL tablet Commonly known as: Nitrostat Place 1 tablet (0.4 mg total) under the tongue every 5 (five) minutes as needed for chest pain (up to 3 doses).   nystatin-triamcinolone ointment Commonly known as: MYCOLOG Apply 1 Application topically as needed.   Olopatadine HCl 0.2 % Soln Commonly known as: Pataday Place 1 drop into both eyes 1 day or 1 dose.   Ryaltris 767-34 MCG/ACT Susp Generic drug: Olopatadine-Mometasone Place 2 sprays into the nose in the morning and at bedtime.   Systane Complete PF 0.6 % Soln Generic drug: Propylene Glycol (PF) Apply 1 drop to eye every  4 (four) hours as needed.   Tradjenta 5 MG Tabs tablet Generic drug: linagliptin Take 5 mg by mouth daily.   zaleplon 10 MG capsule Commonly known as: SONATA Take 20 mg by mouth at bedtime.    Past Medical History:  Diagnosis Date   AAA (abdominal aortic aneurysm) ([place])    a. 3.4 x 3.5 on scan 02/28/13.   Allergic reaction to contrast dye    Atherosclerosis    CAD (coronary artery disease)    a. stenting of RCA/LCx 2004. b. [place] 08/2013:  s/p DES to LCx for severe ISR.   Diabetes mellitus    Diverticulosis    Emphysema lung ([place])    Emphysema of lung ([place])    Encounter for therapeutic drug monitoring 06/06/2016   GERD (gastroesophageal reflux disease)    Hemorrhoids    Hepatic steatosis    History of radiation therapy 09/07/20-09/22/20   SBRT left lung ; Dr Gery Pray   HTN (hypertension)    Hyperlipidemia    Kidney stone    Lung cancer [facility]) dx'd 2004   chemo/xrt comp; tarceva ongoing- January 2022 radiation    Past Surgical History:  Procedure Laterality Date   ANGIOPLASTY / STENTING FEMORAL  08/14/2013   2004-groin   CATARACT EXTRACTION     COLONOSCOPY     LEFT HEART CATHETERIZATION WITH CORONARY ANGIOGRAM N/A 09/08/2013   Procedure: LEFT HEART CATHETERIZATION WITH CORONARY ANGIOGRAM;  Surgeon: Blane Ohara, MD;  Location: [facility] CATH LAB;  Service: Cardiovascular;  Laterality: N/A;   left partial lobectomy     WISDOM TOOTH EXTRACTION      Review of systems negative except as noted in HPI / PMHx or noted below:  Review of Systems  Constitutional: Negative.   HENT: Negative.    Eyes: Negative.   Respiratory: Negative.    Cardiovascular: Negative.   Gastrointestinal: Negative.   Genitourinary: Negative.   Musculoskeletal: Negative.   Skin: Negative.   Neurological: Negative.   Endo/Heme/Allergies: Negative.   Psychiatric/Behavioral: Negative.       Objective:   Vitals:   09/26/22 1445  BP: 134/80  Pulse: 85  Resp: 16  Temp: 98 F (36.7 C)   SpO2: 96%   Height: 6\' 1"  (185.4 cm)  Weight: 193 lb (87.5 kg)   Physical Exam Constitutional:      Appearance: He is not diaphoretic.  HENT:     Head: Normocephalic.     Right Ear: Tympanic membrane, ear canal and external ear normal.     Left Ear: Tympanic membrane, ear canal and external ear normal.     Nose: Nose normal. No mucosal edema or rhinorrhea.     Mouth/Throat:     Pharynx: Uvula midline. No oropharyngeal exudate.  Eyes:     Conjunctiva/sclera: Conjunctivae normal.  Neck:  Thyroid: No thyromegaly.     Trachea: Trachea normal. No tracheal tenderness or tracheal deviation.  Cardiovascular:     Rate and Rhythm: Normal rate and regular rhythm.     Heart sounds: Normal heart sounds, S1 normal and S2 normal. No murmur heard. Pulmonary:     Effort: No respiratory distress.     Breath sounds: Normal breath sounds. No stridor. No wheezing or rales.  Lymphadenopathy:     Head:     Right side of head: No tonsillar adenopathy.     Left side of head: No tonsillar adenopathy.     Cervical: No cervical adenopathy.  Skin:    Findings: Rash (Multiple oval shaped macules with slight scale trunk) present. No erythema.     Nails: There is no clubbing.  Neurological:     Mental Status: He is alert.     Diagnostics: none  Assessment and Plan:   1. Perennial allergic rhinitis   2. Blepharoconjunctivitis of both eyes, unspecified blepharoconjunctivitis type   3. Pityriasis rosea    1.  Visit with dermatology : Pityriasis rosea ???  2.  Treat and prevent inflammation of airway:   A. DECREASE Ryaltris - 2 sprays each nostril 1-2 times per day  3.  Treat and prevent inflammation of the eyes:   A. DECREASE Pataday - 1 drop each eye 1 time per day  B. DECREASE Lotomax - 1 drop each eye 1 time per day  C. Do not rub / touch eyes  4.  Treat secondary bacterial infection of eyes:   A. DECREASE Doxycycline 100 mg - 1 tablet 1 time per day  5.  If needed:   A. OTC  antihistamine  B. OTC Systane eye drops   6.  Return in 8 weeks . Taper medications ???  [name] is doing much better regarding his nose and his eyes and I think there is an opportunity to consolidate some of his treatment and we will decrease his Ryaltris, Pataday, Lotemax, and doxycycline as noted above.  Assuming he continues to do well with this plan I will see him back in this clinic in 8 weeks.  His dermatitis has evolved into what looks like pityriasis rosea and if this is the case and it resolves sometime over the course of the next several weeks then there is no reason for him to see dermatology but certainly if this is a persistent issue he should visit with dermatology.  Allena Katz, MD Allergy / Immunology [place]

## 2022-09-27 ENCOUNTER — Encounter: Payer: Self-pay | Admitting: Allergy and Immunology

## 2022-10-03 ENCOUNTER — Other Ambulatory Visit: Payer: Self-pay | Admitting: Medical Oncology

## 2022-10-03 DIAGNOSIS — C3490 Malignant neoplasm of unspecified part of unspecified bronchus or lung: Secondary | ICD-10-CM

## 2022-10-03 MED ORDER — ERLOTINIB HCL 100 MG PO TABS: 100.0000 mg | ORAL_TABLET | Freq: Every day | ORAL | 1 refills | Status: DC

## 2022-10-06 ENCOUNTER — Telehealth: Payer: Self-pay | Admitting: Internal Medicine

## 2022-10-06 NOTE — Telephone Encounter (Signed)
Called patient regarding upcoming March appointments, patient is notified. 

## 2022-10-17 ENCOUNTER — Inpatient Hospital Stay: Payer: Medicare Other | Attending: Physician Assistant

## 2022-10-17 ENCOUNTER — Other Ambulatory Visit: Payer: Self-pay

## 2022-10-17 ENCOUNTER — Inpatient Hospital Stay: Payer: Medicare Other | Admitting: Internal Medicine

## 2022-10-17 VITALS — BP 154/75 | HR 70 | Temp 98.3°F | Resp 17 | Wt 196.6 lb

## 2022-10-17 DIAGNOSIS — C3491 Malignant neoplasm of unspecified part of right bronchus or lung: Secondary | ICD-10-CM

## 2022-10-17 DIAGNOSIS — Z902 Acquired absence of lung [part of]: Secondary | ICD-10-CM | POA: Diagnosis not present

## 2022-10-17 DIAGNOSIS — C3490 Malignant neoplasm of unspecified part of unspecified bronchus or lung: Secondary | ICD-10-CM

## 2022-10-17 DIAGNOSIS — C3412 Malignant neoplasm of upper lobe, left bronchus or lung: Secondary | ICD-10-CM | POA: Diagnosis present

## 2022-10-17 DIAGNOSIS — Z79899 Other long term (current) drug therapy: Secondary | ICD-10-CM | POA: Insufficient documentation

## 2022-10-17 LAB — CMP (CANCER CENTER ONLY)
ALT: 23 U/L (ref 0–44)
AST: 22 U/L (ref 15–41)
Albumin: 4 g/dL (ref 3.5–5.0)
Alkaline Phosphatase: 87 U/L (ref 38–126)
Anion gap: 5 (ref 5–15)
BUN: 14 mg/dL (ref 8–23)
CO2: 29 mmol/L (ref 22–32)
Calcium: 9.4 mg/dL (ref 8.9–10.3)
Chloride: 101 mmol/L (ref 98–111)
Creatinine: 1.54 mg/dL — ABNORMAL HIGH (ref 0.61–1.24)
GFR, Estimated: 47 mL/min — ABNORMAL LOW (ref >60)
Glucose, Bld: 182 mg/dL — ABNORMAL HIGH (ref 70–99)
Potassium: 4.7 mmol/L (ref 3.5–5.1)
Sodium: 135 mmol/L (ref 135–145)
Total Bilirubin: 1.3 mg/dL — ABNORMAL HIGH (ref 0.3–1.2)
Total Protein: 7 g/dL (ref 6.5–8.1)

## 2022-10-17 LAB — CBC WITH DIFFERENTIAL (CANCER CENTER ONLY)
Abs Immature Granulocytes: 0.04 10*3/uL (ref 0.00–0.07)
Basophils Absolute: 0.1 10*3/uL (ref 0.0–0.1)
Basophils Relative: 1 %
Eosinophils Absolute: 0.2 10*3/uL (ref 0.0–0.5)
Eosinophils Relative: 2 %
HCT: 45.5 % (ref 39.0–52.0)
Hemoglobin: 16.7 g/dL (ref 13.0–17.0)
Immature Granulocytes: 0 %
Lymphocytes Relative: 23 %
Lymphs Abs: 2.4 10*3/uL (ref 0.7–4.0)
MCH: 33.3 pg (ref 26.0–34.0)
MCHC: 36.7 g/dL — ABNORMAL HIGH (ref 30.0–36.0)
MCV: 90.8 fL (ref 80.0–100.0)
Monocytes Absolute: 0.8 10*3/uL (ref 0.1–1.0)
Monocytes Relative: 7 %
Neutro Abs: 7.1 10*3/uL (ref 1.7–7.7)
Neutrophils Relative %: 67 %
Platelet Count: 241 10*3/uL (ref 150–400)
RBC: 5.01 MIL/uL (ref 4.22–5.81)
RDW: 13.7 % (ref 11.5–15.5)
WBC Count: 10.5 10*3/uL (ref 4.0–10.5)
nRBC: 0 % (ref 0.0–0.2)

## 2022-10-17 NOTE — Progress Notes (Signed)
[place] [phone]   [phone]  OFFICE PROGRESS NOTE  [name] Margarita, DO [address] [place] [address]  DIAGNOSIS: Metastatic non-small cell lung cancer, adenocarcinoma initially diagnosed in December of 2004  PRIOR THERAPY: 1) status post course of concurrent chemoradiation under the care of Dr. Truddie Coco. 2) status post left upper lobe wedge resection on 01/10/2005 for recurrent disease in the left lung.  CURRENT THERAPY: Erlotinib 150 mg by mouth daily started in 2006.  Starting August 2022 the patient will be on erlotinib 100 mg p.o. daily.  Status post 18 months of treatment.  INTERVAL HISTORY:  [name] 72 y.o. male returns to the clinic today for 3 months follow-up visit.  The patient is feeling fine today with no concerning complaints except for recent allergy and he was seen by an allergy medicine doctor and was found to have some allergy to his dog.  He was given prescription which did help.  He denied having any current chest pain, shortness of breath, cough or hemoptysis.  He has no nausea, vomiting, diarrhea or constipation.  He has no headache or visual changes.  He is here today for evaluation and repeat blood work.  MEDICAL HISTORY: Past Medical History:  Diagnosis Date   AAA (abdominal aortic aneurysm) ([name])    a. 3.4 x 3.5 on scan 02/28/13.   Allergic reaction to contrast dye    Atherosclerosis    CAD (coronary artery disease)    a. stenting of RCA/LCx 2004. b. [place] 08/2013:  s/p DES to LCx for severe ISR.   Diabetes mellitus    Diverticulosis    Emphysema lung ([place])    Emphysema of lung ([place])    Encounter for therapeutic drug monitoring 06/06/2016   GERD (gastroesophageal reflux disease)    Hemorrhoids    Hepatic steatosis    History of radiation therapy 09/07/20-09/22/20   SBRT left lung ; Dr Gery Pray   HTN (hypertension)    Hyperlipidemia    Kidney stone    Lung cancer [facility]) dx'd 2004   chemo/xrt  comp; tarceva ongoing- January 2022 radiation    ALLERGIES:  is allergic to iohexol, ivp dye [iodinated contrast media], morphine and related, other, and povidone iodine.  MEDICATIONS:  Current Outpatient Medications  Medication Sig Dispense Refill   ACCU-CHEK AVIVA PLUS test strip 1 each daily.     aspirin EC 81 MG tablet Take 81 mg by mouth daily. Swallow whole.     atorvastatin (LIPITOR) 40 MG tablet Take 40 mg by mouth daily.     clopidogrel (PLAVIX) 75 MG tablet TAKE 1 TABLET (75 MG TOTAL) BY MOUTH DAILY. 90 tablet 2   cyanocobalamin 1000 MCG tablet Take 1,000 mcg by mouth daily.     doxycycline (MONODOX) 100 MG capsule Take 1 capsule (100 mg total) by mouth 2 (two) times daily. 60 capsule 1   erlotinib (TARCEVA) 100 MG tablet Take 1 tablet (100 mg total) by mouth daily. Take on an empty stomach 1 hour before meals or 2 hours after. 90 tablet 1   famotidine (PEPCID) 40 MG tablet TAKE 1 TABLET BY MOUTH TWICE A DAY 180 tablet 2   glimepiride (AMARYL) 2 MG tablet Take 4 mg by mouth every morning.     JARDIANCE 25 MG TABS tablet Take 25 mg by mouth daily.     Lancets (FREESTYLE) lancets 1 each daily.     levocetirizine (XYZAL) 5 MG tablet Take 1  tablet (5 mg total) by mouth daily as needed (Can take an extra dose during flare ups.). 60 tablet 5   levothyroxine (SYNTHROID) 25 MCG tablet Take 25 mcg by mouth every morning.     losartan (COZAAR) 25 MG tablet TAKE 1 TABLET BY MOUTH EVERY DAY FOR 90 DAYS     loteprednol (LOTEMAX) 0.5 % ophthalmic suspension Place 1 drop into both eyes daily. 15 mL 5   metoprolol tartrate (LOPRESSOR) 50 MG tablet Take 1 tablet (50 mg total) by mouth 2 (two) times daily. 180 tablet 3   nitroGLYCERIN (NITROSTAT) 0.4 MG SL tablet Place 1 tablet (0.4 mg total) under the tongue every 5 (five) minutes as needed for chest pain (up to 3 doses). 25 tablet 3   nystatin-triamcinolone ointment (MYCOLOG) Apply 1 Application topically as needed.     Olopatadine HCl (PATADAY)  0.2 % SOLN Place 1 drop into both eyes 1 day or 1 dose. 2.5 mL 5   Olopatadine-Mometasone (RYALTRIS) 665-25 MCG/ACT SUSP Place 2 sprays into the nose in the morning and at bedtime. 30 g 5   Propylene Glycol, PF, (SYSTANE COMPLETE PF) 0.6 % SOLN Apply 1 drop to eye every 4 (four) hours as needed. 10 mL 5   TRADJENTA 5 MG TABS tablet Take 5 mg by mouth daily.     zaleplon (SONATA) 10 MG capsule Take 20 mg by mouth at bedtime.  5   No current facility-administered medications for this visit.    REVIEW OF SYSTEMS:  A comprehensive review of systems was negative.   PHYSICAL EXAMINATION: General appearance: alert, cooperative, and no distress Head: Normocephalic, without obvious abnormality, atraumatic Neck: no adenopathy Lymph nodes: Cervical, supraclavicular, and axillary nodes normal. Resp: clear to auscultation bilaterally Back: symmetric, no curvature. ROM normal. No CVA tenderness. Cardio: regular rate and rhythm, S1, S2 normal, no murmur, click, rub or gallop GI: soft, non-tender; bowel sounds normal; no masses,  no organomegaly Extremities: extremities normal, atraumatic, no cyanosis or edema    ECOG PERFORMANCE STATUS: 1 - Symptomatic but completely ambulatory  Blood pressure (!) 154/75, pulse 70, temperature 98.3 F (36.8 C), temperature source Oral, resp. rate 17, weight 196 lb 9 oz (89.2 kg), SpO2 97 %.  LABORATORY DATA: Lab Results  Component Value Date   WBC 10.5 10/17/2022   HGB 16.7 10/17/2022   HCT 45.5 10/17/2022   MCV 90.8 10/17/2022   PLT 241 10/17/2022      Chemistry      Component Value Date/Time   NA 135 10/17/2022 1436   NA 139 06/04/2017 0948   K 4.7 10/17/2022 1436   K 5.1 06/04/2017 0948   CL 101 10/17/2022 1436   CL 106 06/10/2012 0900   CO2 29 10/17/2022 1436   CO2 26 06/04/2017 0948   BUN 14 10/17/2022 1436   BUN 14.2 06/04/2017 0948   CREATININE 1.54 (H) 10/17/2022 1436   CREATININE 1.5 (H) 06/04/2017 0948      Component Value Date/Time    CALCIUM 9.4 10/17/2022 1436   CALCIUM 9.6 06/04/2017 0948   ALKPHOS 87 10/17/2022 1436   ALKPHOS 84 06/04/2017 0948   AST 22 10/17/2022 1436   AST 24 06/04/2017 0948   ALT 23 10/17/2022 1436   ALT 35 06/04/2017 0948   BILITOT 1.3 (H) 10/17/2022 1436   BILITOT 0.82 06/04/2017 0948       RADIOGRAPHIC STUDIES: No results found.   ASSESSMENT/PLAN:  This is a very pleasant 72 years old white male with metastatic  non-small cell lung cancer, adenocarcinoma with positive EGFR mutation and he is currently on treatment with Tarceva since 2006.  This was a switch of 8 months ago to the generic Erlotinib and he is tolerating it well except for few episodes of diarrhea and mild skin rash.  Since the switch to the generic Erlotinib, the patient has been complaining of worsening rash conjunctivitis as well as diarrhea that already improved. He also underwent SBRT to the enlarging left upper lobe lung nodule. His treatment was switched to erlotinib 100 mg p.o. twice daily 18 months ago. The patient has been tolerating this treatment well with no concerning adverse effects. Blood work today is unremarkable except for the persistent mild renal insufficiency. I recommended for him to continue his current treatment with Erlotinib with the same dose. I will see him back for follow-up visit in 3 months with repeat CT scan of the chest without contrast for restaging of his disease. The patient was advised to call immediately if he has any other concerning symptoms in the interval. All questions were answered. The patient knows to call the clinic with any problems, questions or concerns. We can certainly see the patient much sooner if necessary.  Disclaimer: This note was dictated with voice recognition software. Similar sounding words can inadvertently be transcribed and may not be corrected upon review.

## 2022-10-19 ENCOUNTER — Other Ambulatory Visit: Payer: Self-pay | Admitting: Allergy and Immunology

## 2022-10-31 ENCOUNTER — Other Ambulatory Visit (HOSPITAL_COMMUNITY): Payer: Self-pay

## 2022-11-10 ENCOUNTER — Ambulatory Visit (HOSPITAL_COMMUNITY)
Admission: RE | Admit: 2022-11-10 | Discharge: 2022-11-10 | Disposition: A | Discharge status: Home / Self Care | Payer: Medicare Other | Source: Ambulatory Visit | Attending: Cardiovascular Disease | Admitting: Cardiovascular Disease

## 2022-11-10 DIAGNOSIS — I714 Abdominal aortic aneurysm, without rupture, unspecified: Secondary | ICD-10-CM | POA: Insufficient documentation

## 2022-11-21 ENCOUNTER — Other Ambulatory Visit: Payer: Self-pay | Admitting: *Deleted

## 2022-11-21 ENCOUNTER — Ambulatory Visit: Payer: Medicare Other | Admitting: Allergy and Immunology

## 2022-11-21 ENCOUNTER — Other Ambulatory Visit: Payer: Self-pay | Admitting: Allergy and Immunology

## 2022-11-21 VITALS — BP 130/70 | HR 72 | Temp 98.0°F | Resp 14 | Ht 73.0 in | Wt 200.0 lb

## 2022-11-21 DIAGNOSIS — R442 Other hallucinations: Secondary | ICD-10-CM | POA: Diagnosis not present

## 2022-11-21 DIAGNOSIS — L42 Pityriasis rosea: Secondary | ICD-10-CM

## 2022-11-21 DIAGNOSIS — H10503 Unspecified blepharoconjunctivitis, bilateral: Secondary | ICD-10-CM | POA: Diagnosis not present

## 2022-11-21 DIAGNOSIS — J3089 Other allergic rhinitis: Secondary | ICD-10-CM | POA: Diagnosis not present

## 2022-11-21 MED ORDER — LEVOCETIRIZINE DIHYDROCHLORIDE 5 MG PO TABS: 5.0000 mg | ORAL_TABLET | Freq: Every day | ORAL | 1 refills | Status: DC | PRN

## 2022-11-21 MED ORDER — IPRATROPIUM BROMIDE 0.06 % NA SOLN: 2.0000 | Freq: Four times a day (QID) | NASAL | 1 refills | Status: DC | PRN

## 2022-11-21 MED ORDER — SYSTANE COMPLETE PF 0.6 % OP SOLN: 1.0000 [drp] | OPHTHALMIC | 1 refills | Status: DC | PRN

## 2022-11-21 MED ORDER — RYALTRIS 665-25 MCG/ACT NA SUSP: 2.0000 | Freq: Two times a day (BID) | NASAL | 5 refills | Status: DC

## 2022-11-21 MED ORDER — OLOPATADINE HCL 0.2 % OP SOLN
1.0000 [drp] | OPHTHALMIC | 1 refills | Status: DC
Start: 2022-11-21 — End: 2023-05-22

## 2022-11-21 NOTE — Patient Instructions (Addendum)
  1.  Continue Ryaltris - 2 sprays each nostril 3-7 times per week  2.  Continue Doxycycline 100 mg - 1 tablet 1 time per day  3.  If needed:   A. OTC antihistamine  B. OTC Systane eye drops   C. Pataday - 1 drop each eye 1 time per day  D. Ipratropium 0.06% - 2 sprays each nostril every 4-6 hours to dry nose  4.  Return in 6 months or earlier if problem

## 2022-11-21 NOTE — Progress Notes (Unsigned)
[place] - [place] - [place] - [place] - [place]   Follow-up Note  Referring Provider: Charlane Ferretti, DO Primary Provider: Charlane Ferretti, DO Date of Office Visit: 11/21/2022  Subjective:   [name] (DOB: Jun 23, 1951) is a 72 y.o. 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  I last saw him in this clinic 26 September 2022.  His eyes are really doing very well.  He has tapered off his Pataday and tapered off his Lotemax and he just uses Systane at this point in time and continues on doxycycline.  His nose is doing very well at this point in time.  He uses a combination nasal steroid nasal antihistamine approximately twice a week.  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  He has been having problems with a runny nose when he gets exposed to temperature changes.  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  Allergies as of 11/21/2022       Reactions   Iohexol Other (See Comments)    Code: HIVES, Desc: pt recieves  benadryl 1 hour prior to scan   Ivp Dye [iodinated Contrast Media] Rash   Morphine And Related Anxiety   hallucinations   Other Rash   chlorascrub when starting IV   Povidone Iodine Rash   Other reaction(s): heated rash        Medication List    Accu-Chek Aviva Plus test strip Generic drug: glucose blood 1 each daily.   aspirin EC 81 MG tablet Take 81 mg by mouth daily. Swallow whole.   atorvastatin 40 MG tablet Commonly known as: LIPITOR Take 40 mg by mouth daily.   clopidogrel 75 MG tablet Commonly known as: PLAVIX TAKE 1 TABLET (75 MG TOTAL) BY MOUTH  DAILY.   cyanocobalamin 1000 MCG tablet Take 1,000 mcg by mouth daily.   DULoxetine 60 MG capsule Commonly known as: CYMBALTA Take 60 mg by mouth daily.   erlotinib 100 MG tablet Commonly known as: TARCEVA Take 1 tablet (100 mg total) by mouth daily. Take on an empty stomach 1 hour before meals or 2 hours after.   famotidine 40 MG tablet Commonly known as: PEPCID TAKE 1 TABLET BY MOUTH TWICE A DAY   freestyle lancets 1 each daily.   glimepiride 2 MG tablet Commonly known as: AMARYL Take 4 mg by mouth every morning.   ipratropium 0.06 % nasal spray Commonly known as: ATROVENT Place 2 sprays into both nostrils 4 (four) times daily as needed for rhinitis. Started by: Jessica Priest, MD   Jardiance 25 MG Tabs tablet Generic drug: empagliflozin Take 25 mg by mouth daily.   levocetirizine 5 MG tablet Commonly known as: XYZAL Take 1 tablet (5 mg total) by mouth daily as needed (Can take an extra dose during flare ups.).   levothyroxine 25 MCG tablet Commonly known as: SYNTHROID Take 25 mcg by mouth every morning.   losartan 25 MG tablet Commonly known as: COZAAR TAKE 1 TABLET BY MOUTH EVERY DAY FOR 90 DAYS   loteprednol 0.5 % ophthalmic suspension Commonly known as: Lotemax Place 1 drop into both eyes daily.   metoprolol tartrate 50 MG tablet Commonly known as: LOPRESSOR Take 1 tablet (  50 mg total) by mouth 2 (two) times daily.   nitroGLYCERIN 0.4 MG SL tablet Commonly known as: Nitrostat Place 1 tablet (0.4 mg total) under the tongue every 5 (five) minutes as needed for chest pain (up to 3 doses).   nystatin-triamcinolone ointment Commonly known as: MYCOLOG Apply 1 Application topically as needed.   Olopatadine HCl 0.2 % Soln Commonly known as: Pataday Place 1 drop into both eyes 1 day or 1 dose.   Ryaltris 681-27 MCG/ACT Susp Generic drug: Olopatadine-Mometasone Place 2 sprays into the nose in the morning and at bedtime.   Systane Complete PF 0.6 %  Soln Generic drug: Propylene Glycol (PF) Apply 1 drop to eye every 4 (four) hours as needed.   Tradjenta 5 MG Tabs tablet Generic drug: linagliptin Take 5 mg by mouth daily.   zaleplon 10 MG capsule Commonly known as: SONATA Take 20 mg by mouth at bedtime.        Past Medical History:  Diagnosis Date   AAA (abdominal aortic aneurysm) (HCC)    a. 3.4 x 3.5 on scan 02/28/13.   Allergic reaction to contrast dye    Atherosclerosis    CAD (coronary artery disease)    a. stenting of RCA/LCx 2004. b. [place] 08/2013:  s/p DES to LCx for severe ISR.   Diabetes mellitus    Diverticulosis    Emphysema lung (HCC)    Emphysema of lung (HCC)    Encounter for therapeutic drug monitoring 06/06/2016   GERD (gastroesophageal reflux disease)    Hemorrhoids    Hepatic steatosis    History of radiation therapy 09/07/20-09/22/20   SBRT left lung ; Dr Antony Blackbird   HTN (hypertension)    Hyperlipidemia    Kidney stone    Lung cancer [facility]) dx'd 2004   chemo/xrt comp; tarceva ongoing- January 2022 radiation    Past Surgical History:  Procedure Laterality Date   ANGIOPLASTY / STENTING FEMORAL  08/14/2013   2004-groin   CATARACT EXTRACTION     COLONOSCOPY     LEFT HEART CATHETERIZATION WITH CORONARY ANGIOGRAM N/A 09/08/2013   Procedure: LEFT HEART CATHETERIZATION WITH CORONARY ANGIOGRAM;  Surgeon: Micheline Chapman, MD;  Location: [facility] CATH LAB;  Service: Cardiovascular;  Laterality: N/A;   left partial lobectomy     WISDOM TOOTH EXTRACTION      Review of systems negative except as noted in HPI / PMHx or noted below:  Review of Systems  Constitutional: Negative.   HENT: Negative.    Eyes: Negative.   Respiratory: Negative.    Cardiovascular: Negative.   Gastrointestinal: Negative.   Genitourinary: Negative.   Musculoskeletal: Negative.   Skin: Negative.   Neurological: Negative.   Endo/Heme/Allergies: Negative.   Psychiatric/Behavioral: Negative.       Objective:   Vitals:    11/21/22 1405  BP: 130/70  Pulse: 72  Resp: 14  Temp: 98 F (36.7 C)  SpO2: 95%   Height: 6\' 1"  (185.4 cm)  Weight: 200 lb (90.7 kg)   Physical Exam Constitutional:      Appearance: He is not diaphoretic.  HENT:     Head: Normocephalic.     Right Ear: Tympanic membrane, ear canal and external ear normal.     Left Ear: Tympanic membrane, ear canal and external ear normal.     Nose: Nose normal. No mucosal edema or rhinorrhea.     Mouth/Throat:     Pharynx: Uvula midline. No oropharyngeal exudate.  Eyes:     Conjunctiva/sclera: Conjunctivae  normal.  Neck:     Thyroid: No thyromegaly.     Trachea: Trachea normal. No tracheal tenderness or tracheal deviation.  Cardiovascular:     Rate and Rhythm: Normal rate and regular rhythm.     Heart sounds: Normal heart sounds, S1 normal and S2 normal. No murmur heard. Pulmonary:     Effort: No respiratory distress.     Breath sounds: Normal breath sounds. No stridor. No wheezing or rales.  Lymphadenopathy:     Head:     Right side of head: No tonsillar adenopathy.     Left side of head: No tonsillar adenopathy.     Cervical: No cervical adenopathy.  Skin:    Findings: Rash (multipl oval scaly lesions trunk. herald patch left upper abdomen) present. No erythema.     Nails: There is no clubbing.  Neurological:     Mental Status: He is alert.     Diagnostics: none  Assessment and Plan:   1. Perennial allergic rhinitis   2. Blepharoconjunctivitis of both eyes, unspecified blepharoconjunctivitis type   3. Phantosmia   4. Pityriasis rosea    1.  Continue Ryaltris - 2 sprays each nostril 3-7 times per week  2.  Continue Doxycycline 100 mg - 1 tablet 1 time per day  3.  If needed:   A. OTC antihistamine  B. OTC Systane eye drops   C. Pataday - 1 drop each eye 1 time per day  D. Ipratropium 0.06% - 2 sprays each nostril every 4-6 hours to dry nose  4.  Return in 6 months or earlier if problem  [name] AshingJim has improved significantly  from the condition that he initially presented with which was defined by significant inflammation of his airway and his conjunctiva and he will now use his combination nasal steroid/nasal antihistamine few times per week and continue on doxycycline and he has a selection of agents to be utilized should they be required including nasal ipratropium to help with some of his nonallergic rhinitis triggers.  Assuming he does well with this plan I will see him back in this clinic in 6 months or earlier if there is a problem.  His pityriasis rosea does not require any therapy at this point.  Laurette SchimkeEric Trinia Georgi, MD Allergy / Immunology [facility]

## 2022-11-22 ENCOUNTER — Encounter: Payer: Self-pay | Admitting: Allergy and Immunology

## 2022-11-30 ENCOUNTER — Ambulatory Visit: Payer: Medicare Other | Attending: Cardiovascular Disease | Admitting: Cardiovascular Disease

## 2022-11-30 ENCOUNTER — Other Ambulatory Visit: Payer: Self-pay

## 2022-11-30 ENCOUNTER — Encounter: Payer: Self-pay | Admitting: Cardiovascular Disease

## 2022-11-30 VITALS — BP 134/76 | HR 95 | Ht 73.0 in | Wt 195.6 lb

## 2022-11-30 DIAGNOSIS — I10 Essential (primary) hypertension: Secondary | ICD-10-CM

## 2022-11-30 DIAGNOSIS — I7143 Infrarenal abdominal aortic aneurysm, without rupture: Secondary | ICD-10-CM | POA: Diagnosis not present

## 2022-11-30 DIAGNOSIS — E782 Mixed hyperlipidemia: Secondary | ICD-10-CM | POA: Diagnosis not present

## 2022-11-30 DIAGNOSIS — I25119 Atherosclerotic heart disease of native coronary artery with unspecified angina pectoris: Secondary | ICD-10-CM

## 2022-11-30 DIAGNOSIS — I351 Nonrheumatic aortic (valve) insufficiency: Secondary | ICD-10-CM

## 2022-11-30 NOTE — Patient Instructions (Signed)
Medication Instructions:  Your physician recommends that you continue on your current medications as directed. Please refer to the Current Medication list given to you today.  *If you need a refill on your cardiac medications before your next appointment, please call your pharmacy*   Lab Work: NONE If you have labs (blood work) drawn today and your tests are completely normal, you will receive your results only by: MyChart Message (if you have MyChart) OR A paper copy in the mail If you have any lab test that is abnormal or we need to change your treatment, we will call you to review the results.   Testing/Procedures: ECHO Your physician has requested that you have an echocardiogram. Echocardiography is a painless test that uses sound waves to create images of your heart. It provides your doctor with information about the size and shape of your heart and how well your heart's chambers and valves are working. This procedure takes approximately one hour. There are no restrictions for this procedure. Please do NOT wear cologne, perfume, aftershave, or lotions (deodorant is allowed). Please arrive 15 minutes prior to your appointment time.    Follow-Up: At [facility], you and your health needs are our priority.  As part of our continuing mission to provide you with exceptional heart care, we have created designated Provider Care Teams.  These Care Teams include your primary Cardiologist (physician) and Advanced Practice Providers (APPs -  Physician Assistants and Nurse Practitioners) who all work together to provide you with the care you need, when you need it.  We recommend signing up for the patient portal called "MyChart".  Sign up information is provided on this After Visit Summary.  MyChart is used to connect with patients for Virtual Visits (Telemedicine).  Patients are able to view lab/test results, encounter notes, upcoming appointments, etc.  Non-urgent messages can be sent  to your provider as well.   To learn more about what you can do with MyChart, go to [email or web address].    Your next appointment:   1 year(s)  Provider:   Michael Cooper, MD      

## 2022-11-30 NOTE — Progress Notes (Signed)
Cardiology Office Note:    Date:  11/30/2022   ID:  [name], DOB September 11, 1950, MRN [id number]  PCP:  Charlane Ferretti, DO   [facility] Providers Cardiologist:  Tonny Bollman, MD     Referring MD: Charlane Ferretti, DO   Chief Complaint  Patient presents with   Shortness of Breath    History of Present Illness:    [name] is a 72 y.o. male with a hx of coronary artery disease, presenting for follow-up of evaluation.  The patient had remote PCI of the right coronary artery left circumflex vessel in 2004 and repeat stenting of the left circumflex in 2015.  He has a history of metastatic lung cancer found in 2004.  Comorbid conditions include infrarenal abdominal aortic aneurysm, hypertension, mixed hyperlipidemia, diabetes, COPD. He had recurrence of lung cancer in 2022 requiring radiation and he is maintained now on Erlotinib under the care of Dr Arbutus Ped.  In reviewing his most recent note from October 17, 2022, Dr. Arbutus Ped plan to see him back in 3 months with a repeat CT scan of the chest.  The patient is here alone today.  He is getting along pretty well and continues to tolerate his cancer treatment well.  He has mild exertional dyspnea unchanged over time.  No orthopnea, PND, or leg swelling.  When he goes up a hill, he has mild discomfort that he thinks could be is angina.  There is no progression of this.  He has noticed this when the temperatures are cold.  He otherwise is feeling fine and denies any chest pain or pressure with his normal activities.  Past Medical History:  Diagnosis Date   AAA (abdominal aortic aneurysm)    a. 3.4 x 3.5 on scan 02/28/13.   Allergic reaction to contrast dye    Atherosclerosis    CAD (coronary artery disease)    a. stenting of RCA/LCx 2004. b. [place] 08/2013:  s/p DES to LCx for severe ISR.   Diabetes mellitus    Diverticulosis    Emphysema lung    Emphysema of lung    Encounter for therapeutic drug monitoring 06/06/2016   GERD  (gastroesophageal reflux disease)    Hemorrhoids    Hepatic steatosis    History of radiation therapy 09/07/20-09/22/20   SBRT left lung ; Dr Antony Blackbird   HTN (hypertension)    Hyperlipidemia    Kidney stone    Lung cancer dx'd 2004   chemo/xrt comp; tarceva ongoing- January 2022 radiation    Past Surgical History:  Procedure Laterality Date   ANGIOPLASTY / STENTING FEMORAL  08/14/2013   2004-groin   CATARACT EXTRACTION     COLONOSCOPY     LEFT HEART CATHETERIZATION WITH CORONARY ANGIOGRAM N/A 09/08/2013   Procedure: LEFT HEART CATHETERIZATION WITH CORONARY ANGIOGRAM;  Surgeon: Micheline Chapman, MD;  Location: [facility] CATH LAB;  Service: Cardiovascular;  Laterality: N/A;   left partial lobectomy     WISDOM TOOTH EXTRACTION      Current Medications: Current Meds  Medication Sig   ACCU-CHEK AVIVA PLUS test strip 1 each daily.   aspirin EC 81 MG tablet Take 81 mg by mouth daily. Swallow whole.   atorvastatin (LIPITOR) 40 MG tablet Take 40 mg by mouth daily.   clopidogrel (PLAVIX) 75 MG tablet TAKE 1 TABLET (75 MG TOTAL) BY MOUTH DAILY.   cyanocobalamin 1000 MCG tablet Take 1,000 mcg by mouth daily.   erlotinib (TARCEVA) 100 MG tablet Take 1  tablet (100 mg total) by mouth daily. Take on an empty stomach 1 hour before meals or 2 hours after.   famotidine (PEPCID) 40 MG tablet TAKE 1 TABLET BY MOUTH TWICE A DAY   glimepiride (AMARYL) 2 MG tablet Take 4 mg by mouth every morning.   ipratropium (ATROVENT) 0.06 % nasal spray Place 2 sprays into both nostrils 4 (four) times daily as needed for rhinitis.   JARDIANCE 25 MG TABS tablet Take 25 mg by mouth daily.   Lancets (FREESTYLE) lancets 1 each daily.   levocetirizine (XYZAL) 5 MG tablet Take 1 tablet (5 mg total) by mouth daily as needed (Can take an extra dose during flare ups.).   levothyroxine (SYNTHROID) 25 MCG tablet Take 25 mcg by mouth every morning.   losartan (COZAAR) 25 MG tablet TAKE 1 TABLET BY MOUTH EVERY DAY FOR 90 DAYS    loteprednol (LOTEMAX) 0.5 % ophthalmic suspension Place 1 drop into both eyes daily.   metoprolol tartrate (LOPRESSOR) 50 MG tablet Take 1 tablet (50 mg total) by mouth 2 (two) times daily.   nitroGLYCERIN (NITROSTAT) 0.4 MG SL tablet Place 1 tablet (0.4 mg total) under the tongue every 5 (five) minutes as needed for chest pain (up to 3 doses).   nystatin-triamcinolone ointment (MYCOLOG) Apply 1 Application topically as needed.   Olopatadine HCl (PATADAY) 0.2 % SOLN Place 1 drop into both eyes 1 day or 1 dose.   Olopatadine-Mometasone (RYALTRIS) [id number] MCG/ACT SUSP Place 2 sprays into the nose in the morning and at bedtime.   Propylene Glycol, PF, (SYSTANE COMPLETE PF) 0.6 % SOLN Apply 1 drop to eye every 4 (four) hours as needed.   TRADJENTA 5 MG TABS tablet Take 5 mg by mouth daily.   zaleplon (SONATA) 10 MG capsule Take 20 mg by mouth at bedtime.     Allergies:   Iohexol, Ivp dye [iodinated contrast media], Morphine and related, Other, and Povidone iodine   Social History   Socioeconomic History   Marital status: Married    Spouse name: Not on file   Number of children: 1   Years of education: Not on file   Highest education level: Not on file  Occupational History   Occupation: Retired-disability  Tobacco Use   Smoking status: Former    Types: Cigarettes    Quit date: 08/14/2002    Years since quitting: 20.3   Smokeless tobacco: Never  Vaping Use   Vaping Use: Never used  Substance and Sexual Activity   Alcohol use: No   Drug use: No   Sexual activity: Not on file  Other Topics Concern   Not on file  Social History Narrative   Not on file   Social Determinants of Health   Financial Resource Strain: Not on file  Food Insecurity: Not on file  Transportation Needs: Not on file  Physical Activity: Not on file  Stress: Not on file  Social Connections: Not on file     Family History: The patient's family history includes Diabetes in his sister; Heart disease in his  sister; Ovarian cancer in his sister. There is no history of Colon cancer, Esophageal cancer, Rectal cancer, or Stomach cancer.  ROS:   Please see the history of present illness.    All other systems reviewed and are negative.  EKGs/Labs/Other Studies Reviewed:    The following studies were reviewed today: Cardiac Studies & Procedures     STRESS TESTS  MYOCARDIAL PERFUSION IMAGING 07/14/2020  Narrative  Nuclear stress  EF: 46%. Mild generalized hypokinesis.  There was no ST segment deviation noted during stress.  There is mild reduced uptake noted along the inferior wall distribution seen at both rest and partially at stress. No large territory ischemia identified.  This is an intermediate risk study based upon mildly reduced ejection fraction of 46%.  Donato Schultz, MD   ECHOCARDIOGRAM  ECHOCARDIOGRAM COMPLETE 05/30/2017  Narrative *[facility]* [address] [place], [place] [address] [phone]  ------------------------------------------------------------------- Echocardiography  Patient:    Sostenes, Kauffmann MR #:       [id number] Study Date: 05/30/2017 Gender:     M Age:        20 Height:     185.4 cm Weight:     97 kg BSA:        2.25 m^2 Pt. Status: Room:  [facility] [id number] SONOGRAPHER  Randa Evens, Will Dora Sims, MD REFERRING    Tonny Bollman, MD PERFORMING   [facility], Outpatient ATTENDING    Chilton Si, MD  cc:  ------------------------------------------------------------------- LV EF: 45% -   50%  ------------------------------------------------------------------- Indications:      (I35.1).  ------------------------------------------------------------------- History:   PMH:  Functionally bicuspid aortic valve. Acquired from the patient and from the patient&'s chart.  Coronary artery disease. Aortic regurgitation.  Risk factors:  Hypertension. Diabetes mellitus.  Dyslipidemia.  ------------------------------------------------------------------- Study Conclusions  - Left ventricle: The cavity size was normal. Wall thickness was increased in a pattern of mild LVH. Systolic function was mildly reduced. The estimated ejection fraction was in the range of 45% to 50%. Diffuse hypokinesis. Doppler parameters are consistent with abnormal left ventricular relaxation (grade 1 diastolic dysfunction). Doppler parameters are consistent with indeterminate ventricular filling pressure. - Aortic valve: Transvalvular velocity was within the normal range. There was no stenosis. There was moderate regurgitation. Regurgitation pressure half-time: 472 ms. - Mitral valve: Transvalvular velocity was within the normal range. There was no evidence for stenosis. There was no regurgitation. - Right ventricle: The cavity size was normal. Wall thickness was normal. Systolic function was normal. - Atrial septum: No defect or patent foramen ovale was identified. - Tricuspid valve: There was no regurgitation.  ------------------------------------------------------------------- Study data:  Comparison was made to the study of 05/26/2016.  Study status:  Routine.  Procedure:  The patient reported no pain pre or post test. Transthoracic echocardiography. Image quality was adequate.  Study completion:  There were no complications. Echocardiography.  M-mode, complete 2D, spectral Doppler, and color Doppler.  Birthdate:  Patient birthdate: 09-19-1950.  Age:  Patient is 72 yr old.  Sex:  Gender: male.    BMI: 28.2 kg/m^2.  Blood pressure:     109/65  Patient status:  Outpatient.  Study date: Study date: 05/30/2017. Study time: 02:22 PM.  Location:  [facility]  -------------------------------------------------------------------  ------------------------------------------------------------------- Left ventricle:  The cavity size was normal. Wall thickness was increased  in a pattern of mild LVH. Systolic function was mildly reduced. The estimated ejection fraction was in the range of 45% to 50%. Diffuse hypokinesis. Doppler parameters are consistent with abnormal left ventricular relaxation (grade 1 diastolic dysfunction). Doppler parameters are consistent with indeterminate ventricular filling pressure.  ------------------------------------------------------------------- Aortic valve:   Trileaflet; normal thickness leaflets. Mobility was not restricted.  Doppler:  Transvalvular velocity was within the normal range. There was no stenosis. There was moderate regurgitation.  ------------------------------------------------------------------- Aorta:  Aortic root: The aortic root was normal in size. Ascending aorta:  The ascending aorta was mildly dilated.  ------------------------------------------------------------------- Mitral valve:   Structurally normal valve.   Mobility was not restricted.  Doppler:  Transvalvular velocity was within the normal range. There was no evidence for stenosis. There was no regurgitation.  ------------------------------------------------------------------- Left atrium:  The atrium was normal in size.  ------------------------------------------------------------------- Atrial septum:  No defect or patent foramen ovale was identified.  ------------------------------------------------------------------- Right ventricle:  The cavity size was normal. Wall thickness was normal. Systolic function was normal.  ------------------------------------------------------------------- Pulmonic valve:    Doppler:  Transvalvular velocity was within the normal range. There was no evidence for stenosis.  ------------------------------------------------------------------- Tricuspid valve:   Structurally normal valve.    Doppler: Transvalvular velocity was within the normal range. There was  no regurgitation.  ------------------------------------------------------------------- Pulmonary artery:   The main pulmonary artery was normal-sized. Systolic pressure could not be accurately estimated.  ------------------------------------------------------------------- Right atrium:  The atrium was normal in size.  ------------------------------------------------------------------- Pericardium:  There was no pericardial effusion.  ------------------------------------------------------------------- Systemic veins: Inferior vena cava: The vessel was normal in size. The respirophasic diameter changes were in the normal range (>= 50%), consistent with normal central venous pressure.  ------------------------------------------------------------------- Measurements  Left ventricle                           Value        Reference LV ID, ED, PLAX chordal                  45.2  mm     43 - 52 LV ID, ES, PLAX chordal                  35.1  mm     23 - 38 LV fx shortening, PLAX chordal   (L)     22    %      >=29 LV PW thickness, ED                      11.3  mm     --------- IVS/LV PW ratio, ED                      1.08         <=1.3 Stroke volume, 2D                        69    ml     --------- Stroke volume/bsa, 2D                    31    ml/m^2 --------- LV ejection fraction, 1-p A4C            45    %      --------- LV end-diastolic volume, 2-p             96    ml     --------- LV end-systolic volume, 2-p              53    ml     --------- LV ejection fraction, 2-p                45    %      --------- Stroke volume, 2-p                       43    ml     ---------  LV end-diastolic volume/bsa, 2-p         43    ml/m^2 --------- LV end-systolic volume/bsa, 2-p          24    ml/m^2 --------- Stroke volume/bsa, 2-p                   19.1  ml/m^2 --------- LV e&', lateral                           6.43  cm/s   --------- LV E/e&', lateral                         8.76          --------- LV e&', medial                            4.87  cm/s   --------- LV E/e&', medial                          11.56        --------- LV e&', average                           5.65  cm/s   --------- LV E/e&', average                         9.96         ---------  Ventricular septum                       Value        Reference IVS thickness, ED                        12.2  mm     ---------  LVOT                                     Value        Reference LVOT ID, S                               25    mm     --------- LVOT area                                4.91  cm^2   --------- LVOT ID                                  25    mm     --------- LVOT peak velocity, S                    65.4  cm/s   --------- LVOT mean velocity, S                    44.5  cm/s   --------- LVOT VTI, S  14.1  cm     --------- LVOT peak gradient, S                    2     mm Hg  --------- Stroke volume (SV), LVOT DP              69.2  ml     --------- Stroke index (SV/bsa), LVOT DP           30.7  ml/m^2 ---------  Aortic valve                             Value        Reference Aortic regurg pressure half-time         472   ms     ---------  Aorta                                    Value        Reference Aortic root ID, ED                       38    mm     --------- Ascending aorta ID, A-P, S               37    mm     ---------  Left atrium                              Value        Reference LA ID, A-P, ES                           34    mm     --------- LA ID/bsa, A-P                           1.51  cm/m^2 <=2.2 LA volume, S                             44    ml     --------- LA volume/bsa, S                         19.5  ml/m^2 --------- LA volume, ES, 1-p A4C                   46    ml     --------- LA volume/bsa, ES, 1-p A4C               20.4  ml/m^2 --------- LA volume, ES, 1-p A2C                   41    ml     --------- LA volume/bsa, ES, 1-p A2C                18.2  ml/m^2 ---------  Mitral valve                             Value        Reference Mitral E-wave peak velocity  56.3  cm/s   --------- Mitral A-wave peak velocity              68.1  cm/s   --------- Mitral deceleration time         (H)     285   ms     150 - 230 Mitral E/A ratio, peak                   0.8          ---------  Systemic veins                           Value        Reference Estimated CVP                            3     mm Hg  ---------  Right ventricle                          Value        Reference RV s&', lateral, S                        12    cm/s   ---------  Legend: (L)  and  (H)  mark values outside specified reference range.  ------------------------------------------------------------------- Prepared and Electronically Authenticated by  Chilton Si, MD 2018-10-17T17:10:32              EKG:  EKG is ordered today.  The ekg ordered today demonstrates normal sinus rhythm 95 bpm, minimal voltage criteria for LVH may be normal variant.  Recent Labs: 10/17/2022: ALT 23; BUN 14; Creatinine 1.54; Hemoglobin 16.7; Platelet Count 241; Potassium 4.7; Sodium 135  Recent Lipid Panel No results found for: "CHOL", "TRIG", "HDL", "CHOLHDL", "VLDL", "LDLCALC", "LDLDIRECT"   Risk Assessment/Calculations:               Physical Exam:    VS:  BP 134/76   Pulse 95   Ht 6\' 1"  (1.854 m)   Wt 195 lb 9.6 oz (88.7 kg)   SpO2 95%   BMI 25.81 kg/m     Wt Readings from Last 3 Encounters:  11/30/22 195 lb 9.6 oz (88.7 kg)  11/21/22 200 lb (90.7 kg)  10/17/22 196 lb 9 oz (89.2 kg)     GEN:  Well nourished, well developed in no acute distress HEENT: Normal NECK: No JVD; No carotid bruits LYMPHATICS: No lymphadenopathy CARDIAC: RRR, no murmurs, rubs, gallops RESPIRATORY:  Clear to auscultation without rales, wheezing or rhonchi  ABDOMEN: Soft, non-tender, non-distended MUSCULOSKELETAL:  No edema; No deformity  SKIN: Warm and dry NEUROLOGIC:   Alert and oriented x 3 PSYCHIATRIC:  Normal affect   ASSESSMENT:    1. Infrarenal abdominal aortic aneurysm (AAA) without rupture   2. Coronary artery disease involving native coronary artery of native heart with angina pectoris   3. Mixed hyperlipidemia   4. Essential hypertension   5. Aortic valve insufficiency, etiology of cardiac valve disease unspecified    PLAN:    In order of problems listed above:  Reviewed most recent infrarenal abdominal aortic ultrasound demonstrating an aneurysm of 3.9 cm.  He will continue current medications which include losartan and metoprolol.  Follow-up duplex scan in 1 year. Continue DAPT with aspirin and clopidogrel after multiple first generation  DES.  He has tolerated well with no bleeding complications.  Continue atorvastatin.  Continue Jardiance.  Continue metoprolol. Lipids have been at goal.  He has an upcoming physical with his PCP.  Last labs with a cholesterol 122, HDL 29, LDL 62.  Continue atorvastatin 40 mg daily. Blood pressure is well-controlled on current medical therapy. Reviewed prior echo studies from 2018 when the patient had moderate aortic valve insufficiency.  He also had mild LV systolic dysfunction.  Recommend surveillance echocardiogram to evaluate LV function and degree of aortic insufficiency.     Medication Adjustments/Labs and Tests Ordered: Current medicines are reviewed at length with the patient today.  Concerns regarding medicines are outlined above.  Orders Placed This Encounter  Procedures   ECHOCARDIOGRAM COMPLETE   No orders of the defined types were placed in this encounter.   Patient Instructions  Medication Instructions:  Your physician recommends that you continue on your current medications as directed. Please refer to the Current Medication list given to you today.  *If you need a refill on your cardiac medications before your next appointment, please call your pharmacy*   Lab Work: NONE If you  have labs (blood work) drawn today and your tests are completely normal, you will receive your results only by: MyChart Message (if you have MyChart) OR A paper copy in the mail If you have any lab test that is abnormal or we need to change your treatment, we will call you to review the results.   Testing/Procedures: ECHO Your physician has requested that you have an echocardiogram. Echocardiography is a painless test that uses sound waves to create images of your heart. It provides your doctor with information about the size and shape of your heart and how well your heart's chambers and valves are working. This procedure takes approximately one hour. There are no restrictions for this procedure. Please do NOT wear cologne, perfume, aftershave, or lotions (deodorant is allowed). Please arrive 15 minutes prior to your appointment time.  Follow-Up: At [facility] you and your health needs are our priority.  As part of our continuing mission to provide you with exceptional heart care, we have created designated Provider Care Teams.  These Care Teams include your primary Cardiologist (physician) and Advanced Practice Providers (APPs -  Physician Assistants and Nurse Practitioners) who all work together to provide you with the care you need, when you need it.  We recommend signing up for the patient portal called "MyChart".  Sign up information is provided on this After Visit Summary.  MyChart is used to connect with patients for Virtual Visits (Telemedicine).  Patients are able to view lab/test results, encounter notes, upcoming appointments, etc.  Non-urgent messages can be sent to your provider as well.   To learn more about what you can do with MyChart, go to [email or web address].    Your next appointment:   1 year(s)  Provider:   Tonny Bollman, MD        Signed, Tonny Bollman, MD  11/30/2022 4:32 PM    [facility]

## 2022-12-14 ENCOUNTER — Other Ambulatory Visit: Payer: Self-pay | Admitting: Allergy and Immunology

## 2022-12-25 ENCOUNTER — Other Ambulatory Visit: Payer: Self-pay | Admitting: Cardiovascular Disease

## 2022-12-25 ENCOUNTER — Other Ambulatory Visit: Payer: Self-pay | Admitting: Allergy and Immunology

## 2022-12-25 DIAGNOSIS — I259 Chronic ischemic heart disease, unspecified: Secondary | ICD-10-CM

## 2022-12-26 ENCOUNTER — Ambulatory Visit (HOSPITAL_COMMUNITY): Payer: Medicare Other | Attending: Cardiovascular Disease

## 2022-12-26 DIAGNOSIS — I351 Nonrheumatic aortic (valve) insufficiency: Secondary | ICD-10-CM | POA: Insufficient documentation

## 2022-12-26 LAB — ECHOCARDIOGRAM COMPLETE
Area-P 1/2: 2.4 cm2
P 1/2 time: 556 msec
S' Lateral: 3.3 cm

## 2022-12-27 ENCOUNTER — Telehealth: Payer: Self-pay | Admitting: Cardiovascular Disease

## 2022-12-27 NOTE — Telephone Encounter (Signed)
I spoke with the pt and he is anxious to get his Echo results from yesterday... I advised him that Dr Excell Seltzer is reviewing it and we will follow up with him soon re: his results.

## 2022-12-27 NOTE — Telephone Encounter (Signed)
Patient calling in for update on his echo. Please advise

## 2022-12-27 NOTE — Telephone Encounter (Signed)
Already spoke with patient today and documented. No further questions.

## 2023-01-13 ENCOUNTER — Other Ambulatory Visit: Payer: Self-pay | Admitting: Allergy and Immunology

## 2023-01-16 ENCOUNTER — Inpatient Hospital Stay: Payer: Medicare Other | Attending: Physician Assistant

## 2023-01-16 ENCOUNTER — Ambulatory Visit (HOSPITAL_COMMUNITY)
Admission: RE | Admit: 2023-01-16 | Discharge: 2023-01-16 | Disposition: A | Discharge status: Home / Self Care | Payer: Medicare Other | Source: Ambulatory Visit | Attending: Internal Medicine | Admitting: Internal Medicine

## 2023-01-16 ENCOUNTER — Other Ambulatory Visit: Payer: Self-pay

## 2023-01-16 DIAGNOSIS — C3490 Malignant neoplasm of unspecified part of unspecified bronchus or lung: Secondary | ICD-10-CM

## 2023-01-16 DIAGNOSIS — C3412 Malignant neoplasm of upper lobe, left bronchus or lung: Secondary | ICD-10-CM | POA: Insufficient documentation

## 2023-01-16 DIAGNOSIS — Z79899 Other long term (current) drug therapy: Secondary | ICD-10-CM | POA: Insufficient documentation

## 2023-01-16 LAB — CMP (CANCER CENTER ONLY)
ALT: 21 U/L (ref 0–44)
AST: 21 U/L (ref 15–41)
Albumin: 3.9 g/dL (ref 3.5–5.0)
Alkaline Phosphatase: 84 U/L (ref 38–126)
Anion gap: 5 (ref 5–15)
BUN: 22 mg/dL (ref 8–23)
CO2: 26 mmol/L (ref 22–32)
Calcium: 9.1 mg/dL (ref 8.9–10.3)
Chloride: 105 mmol/L (ref 98–111)
Creatinine: 1.76 mg/dL — ABNORMAL HIGH (ref 0.61–1.24)
GFR, Estimated: 40 mL/min — ABNORMAL LOW
Glucose, Bld: 226 mg/dL — ABNORMAL HIGH (ref 70–99)
Potassium: 4.4 mmol/L (ref 3.5–5.1)
Sodium: 136 mmol/L (ref 135–145)
Total Bilirubin: 1.2 mg/dL (ref 0.3–1.2)
Total Protein: 6.7 g/dL (ref 6.5–8.1)

## 2023-01-16 LAB — CBC WITH DIFFERENTIAL (CANCER CENTER ONLY)
Abs Immature Granulocytes: 0.03 10*3/uL (ref 0.00–0.07)
Basophils Absolute: 0 10*3/uL (ref 0.0–0.1)
Basophils Relative: 1 %
Eosinophils Absolute: 0.2 10*3/uL (ref 0.0–0.5)
Eosinophils Relative: 3 %
HCT: 45.1 % (ref 39.0–52.0)
Hemoglobin: 16 g/dL (ref 13.0–17.0)
Immature Granulocytes: 0 %
Lymphocytes Relative: 21 %
Lymphs Abs: 1.7 10*3/uL (ref 0.7–4.0)
MCH: 32.5 pg (ref 26.0–34.0)
MCHC: 35.5 g/dL (ref 30.0–36.0)
MCV: 91.5 fL (ref 80.0–100.0)
Monocytes Absolute: 0.7 10*3/uL (ref 0.1–1.0)
Monocytes Relative: 9 %
Neutro Abs: 5.3 10*3/uL (ref 1.7–7.7)
Neutrophils Relative %: 66 %
Platelet Count: 222 10*3/uL (ref 150–400)
RBC: 4.93 MIL/uL (ref 4.22–5.81)
RDW: 13.9 % (ref 11.5–15.5)
WBC Count: 8 10*3/uL (ref 4.0–10.5)
nRBC: 0 % (ref 0.0–0.2)

## 2023-01-18 ENCOUNTER — Telehealth: Payer: Self-pay | Admitting: Medical Oncology

## 2023-01-18 ENCOUNTER — Inpatient Hospital Stay: Payer: Medicare Other | Admitting: Internal Medicine

## 2023-01-18 ENCOUNTER — Other Ambulatory Visit: Payer: Self-pay

## 2023-01-18 VITALS — BP 118/61 | HR 78 | Temp 97.4°F | Resp 18 | Wt 194.7 lb

## 2023-01-18 DIAGNOSIS — C3412 Malignant neoplasm of upper lobe, left bronchus or lung: Secondary | ICD-10-CM | POA: Diagnosis present

## 2023-01-18 DIAGNOSIS — Z79899 Other long term (current) drug therapy: Secondary | ICD-10-CM | POA: Diagnosis not present

## 2023-01-18 NOTE — Progress Notes (Signed)
[name] [phone]   Fax:(336) [phone]  OFFICE PROGRESS NOTE  Charlane Ferretti, DO [address] [place] [address]  DIAGNOSIS: Metastatic non-small cell lung cancer, adenocarcinoma initially diagnosed in December of 2004  PRIOR THERAPY: 1) status post course of concurrent chemoradiation under the care of Dr. Donnie Coffin. 2) status post left upper lobe wedge resection on 01/10/2005 for recurrent disease in the left lung.  CURRENT THERAPY: Erlotinib 150 mg by mouth daily started in 2006.  Starting August 2022 the patient will be on erlotinib 100 mg p.o. daily.  Status post 21 months of treatment.  INTERVAL HISTORY:  [name] 72 y.o. male returns to the clinic today for follow-up visit.  The patient is feeling fine today with no concerning complaints.  He continues to tolerate his treatment with Erlotinib fairly well.  He denied having any skin rash or diarrhea.  He has no chest pain, shortness of breath, cough or hemoptysis.  He has no nausea, vomiting, diarrhea or constipation.  He has no headache or visual changes.  He had repeat CT scan of the chest performed recently and is here for evaluation and discussion of his scan results.  MEDICAL HISTORY: Past Medical History:  Diagnosis Date   AAA (abdominal aortic aneurysm) (HCC)    a. 3.4 x 3.5 on scan 02/28/13.   Allergic reaction to contrast dye    Atherosclerosis    CAD (coronary artery disease)    a. stenting of RCA/LCx 2004. b. [place] 08/2013:  s/p DES to LCx for severe ISR.   Diabetes mellitus    Diverticulosis    Emphysema lung (HCC)    Emphysema of lung (HCC)    Encounter for therapeutic drug monitoring 06/06/2016   GERD (gastroesophageal reflux disease)    Hemorrhoids    Hepatic steatosis    History of radiation therapy 09/07/20-09/22/20   SBRT left lung ; Dr Antony Blackbird   HTN (hypertension)    Hyperlipidemia    Kidney stone    Lung cancer [facility]) dx'd 2004   chemo/xrt comp; tarceva  ongoing- January 2022 radiation    ALLERGIES:  is allergic to iohexol, ivp dye [iodinated contrast media], morphine and codeine, other, and povidone iodine.  MEDICATIONS:  Current Outpatient Medications  Medication Sig Dispense Refill   ACCU-CHEK AVIVA PLUS test strip 1 each daily.     aspirin EC 81 MG tablet Take 81 mg by mouth daily. Swallow whole.     atorvastatin (LIPITOR) 40 MG tablet Take 40 mg by mouth daily.     clopidogrel (PLAVIX) 75 MG tablet TAKE 1 TABLET (75 MG TOTAL) BY MOUTH DAILY. 90 tablet 2   cyanocobalamin 1000 MCG tablet Take 1,000 mcg by mouth daily.     DULoxetine (CYMBALTA) 60 MG capsule Take 60 mg by mouth daily. (Patient not taking: Reported on 11/30/2022)     erlotinib (TARCEVA) 100 MG tablet Take 1 tablet (100 mg total) by mouth daily. Take on an empty stomach 1 hour before meals or 2 hours after. 90 tablet 1   famotidine (PEPCID) 40 MG tablet TAKE 1 TABLET BY MOUTH TWICE A DAY 180 tablet 2   glimepiride (AMARYL) 2 MG tablet Take 4 mg by mouth every morning.     ipratropium (ATROVENT) 0.06 % nasal spray SPRAY 2 SPRAYS INTO EACH NOSTRIL 4 TIMES A DAY AS NEEDED FOR RHINITIS 15 mL 5   JARDIANCE 25 MG TABS tablet Take 25 mg by mouth daily.  Lancets (FREESTYLE) lancets 1 each daily.     levocetirizine (XYZAL) 5 MG tablet Take 1 tablet (5 mg total) by mouth daily as needed (Can take an extra dose during flare ups.). 180 tablet 1   levothyroxine (SYNTHROID) 25 MCG tablet Take 25 mcg by mouth every morning.     losartan (COZAAR) 25 MG tablet TAKE 1 TABLET BY MOUTH EVERY DAY FOR 90 DAYS     loteprednol (LOTEMAX) 0.5 % ophthalmic suspension Place 1 drop into both eyes daily. 15 mL 5   metoprolol tartrate (LOPRESSOR) 50 MG tablet Take 1 tablet (50 mg total) by mouth 2 (two) times daily. 180 tablet 3   nitroGLYCERIN (NITROSTAT) 0.4 MG SL tablet Place 1 tablet (0.4 mg total) under the tongue every 5 (five) minutes as needed for chest pain (up to 3 doses). 25 tablet 3    nystatin-triamcinolone ointment (MYCOLOG) Apply 1 Application topically as needed.     Olopatadine HCl (PATADAY) 0.2 % SOLN Place 1 drop into both eyes 1 day or 1 dose. 8 mL 1   Olopatadine-Mometasone (RYALTRIS) 665-25 MCG/ACT SUSP Place 2 sprays into the nose in the morning and at bedtime. 30 g 5   Propylene Glycol, PF, (SYSTANE COMPLETE PF) 0.6 % SOLN Apply 1 drop to eye every 4 (four) hours as needed. 30 mL 1   TRADJENTA 5 MG TABS tablet Take 5 mg by mouth daily.     zaleplon (SONATA) 10 MG capsule Take 20 mg by mouth at bedtime.  5   No current facility-administered medications for this visit.    REVIEW OF SYSTEMS:  Constitutional: negative Eyes: negative Ears, nose, mouth, throat, and face: negative Respiratory: negative Cardiovascular: negative Gastrointestinal: negative Genitourinary:negative Integument/breast: negative Hematologic/lymphatic: negative Musculoskeletal:negative Neurological: negative Behavioral/Psych: negative Endocrine: negative Allergic/Immunologic: negative   PHYSICAL EXAMINATION: General appearance: alert, cooperative, and no distress Head: Normocephalic, without obvious abnormality, atraumatic Neck: no adenopathy Lymph nodes: Cervical, supraclavicular, and axillary nodes normal. Resp: clear to auscultation bilaterally Back: symmetric, no curvature. ROM normal. No CVA tenderness. Cardio: regular rate and rhythm, S1, S2 normal, no murmur, click, rub or gallop GI: soft, non-tender; bowel sounds normal; no masses,  no organomegaly Extremities: extremities normal, atraumatic, no cyanosis or edema Neurologic: Alert and oriented X 3, normal strength and tone. Normal symmetric reflexes. Normal coordination and gait    ECOG PERFORMANCE STATUS: 1 - Symptomatic but completely ambulatory  Blood pressure 118/61, pulse 78, temperature (!) 97.4 F (36.3 C), temperature source Oral, resp. rate 18, weight 194 lb 11.2 oz (88.3 kg), SpO2 96 %.  LABORATORY DATA: Lab  Results  Component Value Date   WBC 8.0 01/16/2023   HGB 16.0 01/16/2023   HCT 45.1 01/16/2023   MCV 91.5 01/16/2023   PLT 222 01/16/2023      Chemistry      Component Value Date/Time   NA 136 01/16/2023 1247   NA 139 06/04/2017 0948   K 4.4 01/16/2023 1247   K 5.1 06/04/2017 0948   CL 105 01/16/2023 1247   CL 106 06/10/2012 0900   CO2 26 01/16/2023 1247   CO2 26 06/04/2017 0948   BUN 22 01/16/2023 1247   BUN 14.2 06/04/2017 0948   CREATININE 1.76 (H) 01/16/2023 1247   CREATININE 1.5 (H) 06/04/2017 0948      Component Value Date/Time   CALCIUM 9.1 01/16/2023 1247   CALCIUM 9.6 06/04/2017 0948   ALKPHOS 84 01/16/2023 1247   ALKPHOS 84 06/04/2017 0948   AST 21 01/16/2023 1247  AST 24 06/04/2017 0948   ALT 21 01/16/2023 1247   ALT 35 06/04/2017 0948   BILITOT 1.2 01/16/2023 1247   BILITOT 0.82 06/04/2017 0948       RADIOGRAPHIC STUDIES: ECHOCARDIOGRAM COMPLETE  Result Date: 12/26/2022    ECHOCARDIOGRAM REPORT   Patient Name:   [name] Date of Exam: 12/26/2022 Medical Rec #:  [id number]        Height:       73.0 in Accession #:    [id number]       Weight:       195.6 lb Date of Birth:  June 28, 1951        BSA:          2.131 m Patient Age:    72 years         BP:           187/93 mmHg Patient Gender: M                HR:           69 bpm. Exam Location:  [address] Procedure: 2D Echo, 3D Echo, Strain Analysis, Cardiac Doppler and Color Doppler Indications:    I35.1 Aortic Insifficiency  History:        Patient has prior history of Echocardiogram examinations, most                 recent 05/30/2017. CAD, AAA, Lung cancer; Risk                 Factors:Hypertension and HLD.  Sonographer:    Clearence Ped RCS Referring Phys: [id number] MICHAEL COOPER IMPRESSIONS  1. Left ventricular ejection fraction, by estimation, is 55 to 60%. The left ventricle has normal function. The left ventricle has no regional wall motion abnormalities. The left ventricular internal cavity size was  mildly dilated. Left ventricular diastolic parameters are consistent with Grade I diastolic dysfunction (impaired relaxation). The average left ventricular global longitudinal strain is -18.3 %. The global longitudinal strain is normal.  2. Right ventricular systolic function is normal. The right ventricular size is normal.  3. The mitral valve is normal in structure. No evidence of mitral valve regurgitation. No evidence of mitral stenosis.  4. The aortic valve is tricuspid. There is moderate calcification of the aortic valve. There is moderate thickening of the aortic valve. Aortic valve regurgitation is moderate. Aortic valve sclerosis/calcification is present, without any evidence of aortic stenosis.  5. Aortic dilatation noted. There is mild dilatation of the aortic root, measuring 38 mm. There is mild dilatation of the ascending aorta, measuring 38 mm.  6. The inferior vena cava is normal in size with greater than 50% respiratory variability, suggesting right atrial pressure of 3 mmHg. FINDINGS  Left Ventricle: Left ventricular ejection fraction, by estimation, is 55 to 60%. The left ventricle has normal function. The left ventricle has no regional wall motion abnormalities. The average left ventricular global longitudinal strain is -18.3 %. The global longitudinal strain is normal. The left ventricular internal cavity size was mildly dilated. There is no left ventricular hypertrophy. Left ventricular diastolic parameters are consistent with Grade I diastolic dysfunction (impaired relaxation). Right Ventricle: The right ventricular size is normal. No increase in right ventricular wall thickness. Right ventricular systolic function is normal. Left Atrium: Left atrial size was normal in size. Right Atrium: Right atrial size was normal in size. Pericardium: There is no evidence of pericardial effusion. Mitral Valve: The mitral valve is normal in  structure. No evidence of mitral valve regurgitation. No evidence of  mitral valve stenosis. Tricuspid Valve: The tricuspid valve is normal in structure. Tricuspid valve regurgitation is trivial. No evidence of tricuspid stenosis. Aortic Valve: The aortic valve is tricuspid. There is moderate calcification of the aortic valve. There is moderate thickening of the aortic valve. Aortic valve regurgitation is moderate. Aortic regurgitation PHT measures 556 msec. Aortic valve sclerosis/calcification is present, without any evidence of aortic stenosis. Pulmonic Valve: The pulmonic valve was normal in structure. Pulmonic valve regurgitation is not visualized. No evidence of pulmonic stenosis. Aorta: Aortic dilatation noted. There is mild dilatation of the aortic root, measuring 38 mm. There is mild dilatation of the ascending aorta, measuring 38 mm. Venous: The inferior vena cava is normal in size with greater than 50% respiratory variability, suggesting right atrial pressure of 3 mmHg. IAS/Shunts: No atrial level shunt detected by color flow Doppler.  LEFT VENTRICLE PLAX 2D LVIDd:         4.60 cm   Diastology LVIDs:         3.30 cm   LV e' medial:    4.57 cm/s LV PW:         1.20 cm   LV E/e' medial:  9.1 LV IVS:        1.10 cm   LV e' lateral:   7.29 cm/s LVOT diam:     2.00 cm   LV E/e' lateral: 5.7 LV SV:         52 LV SV Index:   25        2D Longitudinal Strain LVOT Area:     3.14 cm  2D Strain GLS (A2C):   -19.3 %                          2D Strain GLS (A3C):   -18.3 %                          2D Strain GLS (A4C):   -17.2 %                          2D Strain GLS Avg:     -18.3 %                           3D Volume EF:                          3D EF:        51 %                          LV EDV:       138 ml                          LV ESV:       68 ml                          LV SV:        70 ml RIGHT VENTRICLE RV Basal diam:  2.80 cm RV S prime:     14.40 cm/s TAPSE (M-mode): 2.2 cm LEFT ATRIUM  Index        RIGHT ATRIUM           Index LA diam:        3.60 cm 1.69 cm/m    RA Area:     10.20 cm LA Vol (A2C):   43.8 ml 20.55 ml/m  RA Volume:   19.30 ml  9.06 ml/m LA Vol (A4C):   28.3 ml 13.28 ml/m LA Biplane Vol: 37.9 ml 17.78 ml/m  AORTIC VALVE LVOT Vmax:   73.40 cm/s LVOT Vmean:  54.800 cm/s LVOT VTI:    0.167 m AI PHT:      556 msec  AORTA Ao Root diam: 3.80 cm Ao Asc diam:  3.80 cm MITRAL VALVE MV Area (PHT):             SHUNTS MV Decel Time:             Systemic VTI:  0.17 m MV E velocity: 41.40 cm/s  Systemic Diam: 2.00 cm MV A velocity: 84.90 cm/s MV E/A ratio:  0.49 Charlton Haws MD Electronically signed by Charlton Haws MD Signature Date/Time: 12/26/2022/5:06:44 PM    Final      ASSESSMENT/PLAN:  This is a very pleasant 72 years old white male with metastatic non-small cell lung cancer, adenocarcinoma with positive EGFR mutation and he is currently on treatment with Tarceva since 2006.  This was a switch of 8 months ago to the generic Erlotinib and he is tolerating it well except for few episodes of diarrhea and mild skin rash.  Since the switch to the generic Erlotinib, the patient has been complaining of worsening rash conjunctivitis as well as diarrhea that already improved. He also underwent SBRT to the enlarging left upper lobe lung nodule. His treatment was switched to erlotinib 100 mg p.o. twice daily 21 months ago. The patient has been tolerating his treatment with Erlotinib fairly well with no concerning complaints. He had repeat CT scan of the chest performed recently.  The final report of the scan is still pending but I personally and independently reviewed the scan images and discussed the result with the patient today. I do not see any clear evidence for disease progression but I will wait for the final report for confirmation. I recommended for him to continue his current treatment with Erlotinib 100 mg p.o. daily for now. I will see him back for follow-up visit in 3 months for evaluation and repeat blood work. The patient was advised to call  immediately if he has any other concerning symptoms in the interval.  All questions were answered. The patient knows to call the clinic with any problems, questions or concerns. We can certainly see the patient much sooner if necessary.  Disclaimer: This note was dictated with voice recognition software. Similar sounding words can inadvertently be transcribed and may not be corrected upon review.

## 2023-01-18 NOTE — Telephone Encounter (Signed)
Wife notified to pick up pt.

## 2023-01-20 ENCOUNTER — Telehealth: Payer: Self-pay | Admitting: Internal Medicine

## 2023-02-12 ENCOUNTER — Other Ambulatory Visit: Payer: Self-pay | Admitting: Cardiovascular Disease

## 2023-02-12 ENCOUNTER — Other Ambulatory Visit: Payer: Self-pay | Admitting: Internal Medicine

## 2023-03-28 ENCOUNTER — Other Ambulatory Visit: Payer: Self-pay | Admitting: Cardiovascular Disease

## 2023-03-28 DIAGNOSIS — I259 Chronic ischemic heart disease, unspecified: Secondary | ICD-10-CM

## 2023-04-13 ENCOUNTER — Other Ambulatory Visit: Payer: Self-pay | Admitting: Cardiovascular Disease

## 2023-04-13 DIAGNOSIS — I259 Chronic ischemic heart disease, unspecified: Secondary | ICD-10-CM

## 2023-04-17 ENCOUNTER — Telehealth: Payer: Self-pay | Admitting: Cardiovascular Disease

## 2023-04-17 NOTE — Telephone Encounter (Signed)
Called pt to inform him that his medication was D/C by his oncologist and that the medication was no longer on his medication list. I advised that pt that if he has any other problems, questions or concerns,to give our office a call back. Pt verbalized understanding.

## 2023-04-17 NOTE — Telephone Encounter (Signed)
*  STAT* If patient is at the pharmacy, call can be transferred to refill team.   1. Which medications need to be refilled? (please list name of each medication and dose if known) Isosorbide   2. Which pharmacy/location (including street and city if local pharmacy) is medication to be sent to? [id number] - [place] [place] - [address] [facility] [place] [facility]   3. Do they need a 30 day or 90 day supply?   90 day supply + refills  Patient states he has been out of medication for a few days.

## 2023-04-19 ENCOUNTER — Inpatient Hospital Stay: Payer: Medicare Other | Admitting: Internal Medicine

## 2023-04-19 ENCOUNTER — Telehealth: Payer: Self-pay | Admitting: Cardiovascular Disease

## 2023-04-19 ENCOUNTER — Other Ambulatory Visit: Payer: Self-pay

## 2023-04-19 ENCOUNTER — Inpatient Hospital Stay: Payer: Medicare Other | Attending: Physician Assistant

## 2023-04-19 DIAGNOSIS — Z79899 Other long term (current) drug therapy: Secondary | ICD-10-CM | POA: Insufficient documentation

## 2023-04-19 DIAGNOSIS — C3490 Malignant neoplasm of unspecified part of unspecified bronchus or lung: Secondary | ICD-10-CM

## 2023-04-19 DIAGNOSIS — C3412 Malignant neoplasm of upper lobe, left bronchus or lung: Secondary | ICD-10-CM | POA: Diagnosis present

## 2023-04-19 LAB — CMP (CANCER CENTER ONLY)
ALT: 16 U/L (ref 0–44)
AST: 18 U/L (ref 15–41)
Albumin: 3.8 g/dL (ref 3.5–5.0)
Alkaline Phosphatase: 79 U/L (ref 38–126)
Anion gap: 5 (ref 5–15)
BUN: 15 mg/dL (ref 8–23)
CO2: 26 mmol/L (ref 22–32)
Calcium: 9.2 mg/dL (ref 8.9–10.3)
Chloride: 106 mmol/L (ref 98–111)
Creatinine: 1.59 mg/dL — ABNORMAL HIGH (ref 0.61–1.24)
GFR, Estimated: 46 mL/min — ABNORMAL LOW (ref >60)
Glucose, Bld: 211 mg/dL — ABNORMAL HIGH (ref 70–99)
Potassium: 4.5 mmol/L (ref 3.5–5.1)
Sodium: 137 mmol/L (ref 135–145)
Total Bilirubin: 1.2 mg/dL (ref 0.3–1.2)
Total Protein: 6.6 g/dL (ref 6.5–8.1)

## 2023-04-19 LAB — CBC WITH DIFFERENTIAL (CANCER CENTER ONLY)
Abs Immature Granulocytes: 0.04 10*3/uL (ref 0.00–0.07)
Basophils Absolute: 0 10*3/uL (ref 0.0–0.1)
Basophils Relative: 1 %
Eosinophils Absolute: 0.2 10*3/uL (ref 0.0–0.5)
Eosinophils Relative: 2 %
HCT: 44.7 % (ref 39.0–52.0)
Hemoglobin: 16 g/dL (ref 13.0–17.0)
Immature Granulocytes: 1 %
Lymphocytes Relative: 18 %
Lymphs Abs: 1.6 10*3/uL (ref 0.7–4.0)
MCH: 32.5 pg (ref 26.0–34.0)
MCHC: 35.8 g/dL (ref 30.0–36.0)
MCV: 90.9 fL (ref 80.0–100.0)
Monocytes Absolute: 0.6 10*3/uL (ref 0.1–1.0)
Monocytes Relative: 7 %
Neutro Abs: 6.1 10*3/uL (ref 1.7–7.7)
Neutrophils Relative %: 71 %
Platelet Count: 230 10*3/uL (ref 150–400)
RBC: 4.92 MIL/uL (ref 4.22–5.81)
RDW: 13.5 % (ref 11.5–15.5)
WBC Count: 8.5 10*3/uL (ref 4.0–10.5)
nRBC: 0 % (ref 0.0–0.2)

## 2023-04-19 MED ORDER — ERLOTINIB HCL 100 MG PO TABS: 100.0000 mg | ORAL_TABLET | Freq: Every day | ORAL | 1 refills | Status: DC

## 2023-04-19 MED ORDER — ISOSORBIDE MONONITRATE ER 60 MG PO TB24: 60.0000 mg | ORAL_TABLET | Freq: Every day | ORAL | 0 refills | Status: DC

## 2023-04-19 NOTE — Telephone Encounter (Signed)
See previous encounter.  Patient is requesting to speak with Dr. Earmon Phoenix nurse regarding Isosorbide. He preferred not to go into detail with me. He states he just needs to speak with the nurse when she is available.

## 2023-04-19 NOTE — Telephone Encounter (Signed)
Per OV note on 11/18/21:  He is treated with isosorbide and metoprolol for antianginal therapy.   Pt was seen by oncology and the medication was discontinued on 01/12/22. Pt called this office and MD denies that he made changes. Pt now requesting refill from [place]. Advised that this was likely a mistake during the rooming process at that office. Pt last seen here on 11/30/22 by Excell Seltzer, but no mention of Imdur since drug was not on the [facility] at that time. Explained to patient that I would issue him a month fill and speak with Excell Seltzer to ensure continuation is recommended. Pt has not had any angina, but has been taking medication until now when RX expired.

## 2023-04-19 NOTE — Progress Notes (Signed)
[facility] Telephone:(336) [phone]   Fax:(336) [phone]  OFFICE PROGRESS NOTE  Charlane Ferretti, DO [address] [place] [address]  DIAGNOSIS: Metastatic non-small cell lung cancer, adenocarcinoma initially diagnosed in December of 2004  PRIOR THERAPY: 1) status post course of concurrent chemoradiation under the care of Dr. Donnie Coffin. 2) status post left upper lobe wedge resection on 01/10/2005 for recurrent disease in the left lung.  CURRENT THERAPY: Erlotinib 150 mg by mouth daily started in 2006.  Starting August 2022 the patient will be on erlotinib 100 mg p.o. daily.  Status post 24 months of treatment.  INTERVAL HISTORY:  [name] 72 y.o. male returns to the clinic today for 3 months follow-up visit.  The patient is feeling fine today with no concerning complaints.  He has been off his medication with Erlotinib for few days because he ran out of the refills.  He denied having any current chest pain, shortness of breath, cough or hemoptysis.  He has no nausea, vomiting, diarrhea or constipation.  He has no headache or visual changes.  He has no fever or chills.  He is here today for evaluation and repeat blood work.  MEDICAL HISTORY: Past Medical History:  Diagnosis Date   AAA (abdominal aortic aneurysm) (HCC)    a. 3.4 x 3.5 on scan 02/28/13.   Allergic reaction to contrast dye    Atherosclerosis    CAD (coronary artery disease)    a. stenting of RCA/LCx 2004. b. [place] 08/2013:  s/p DES to LCx for severe ISR.   Diabetes mellitus    Diverticulosis    Emphysema lung (HCC)    Emphysema of lung (HCC)    Encounter for therapeutic drug monitoring 06/06/2016   GERD (gastroesophageal reflux disease)    Hemorrhoids    Hepatic steatosis    History of radiation therapy 09/07/20-09/22/20   SBRT left lung ; Dr Antony Blackbird   HTN (hypertension)    Hyperlipidemia    Kidney stone    Lung cancer [facility]) dx'd 2004   chemo/xrt comp; tarceva ongoing- January 2022  radiation    ALLERGIES:  is allergic to iohexol, ivp dye [iodinated contrast media], morphine and codeine, other, and povidone iodine.  MEDICATIONS:  Current Outpatient Medications  Medication Sig Dispense Refill   ACCU-CHEK AVIVA PLUS test strip 1 each daily.     aspirin EC 81 MG tablet Take 81 mg by mouth daily. Swallow whole.     atorvastatin (LIPITOR) 40 MG tablet Take 40 mg by mouth daily.     clopidogrel (PLAVIX) 75 MG tablet TAKE 1 TABLET (75 MG TOTAL) BY MOUTH DAILY. 90 tablet 2   cyanocobalamin 1000 MCG tablet Take 1,000 mcg by mouth daily.     DULoxetine (CYMBALTA) 60 MG capsule Take 60 mg by mouth daily. (Patient not taking: Reported on 11/30/2022)     erlotinib (TARCEVA) 100 MG tablet Take 1 tablet (100 mg total) by mouth daily. Take on an empty stomach 1 hour before meals or 2 hours after. 90 tablet 1   famotidine (PEPCID) 40 MG tablet Take 1 tablet (40 mg total) by mouth 2 (two) times daily. Please call [phone] to schedule an office visit for more refills. 180 tablet 2   glimepiride (AMARYL) 2 MG tablet Take 4 mg by mouth every morning.     ipratropium (ATROVENT) 0.06 % nasal spray SPRAY 2 SPRAYS INTO EACH NOSTRIL 4 TIMES A DAY AS NEEDED FOR RHINITIS 15 mL 5  JARDIANCE 25 MG TABS tablet Take 25 mg by mouth daily.     Lancets (FREESTYLE) lancets 1 each daily.     levocetirizine (XYZAL) 5 MG tablet Take 1 tablet (5 mg total) by mouth daily as needed (Can take an extra dose during flare ups.). 180 tablet 1   levothyroxine (SYNTHROID) 25 MCG tablet Take 25 mcg by mouth every morning.     losartan (COZAAR) 25 MG tablet TAKE 1 TABLET BY MOUTH EVERY DAY FOR 90 DAYS     loteprednol (LOTEMAX) 0.5 % ophthalmic suspension Place 1 drop into both eyes daily. 15 mL 5   metoprolol tartrate (LOPRESSOR) 50 MG tablet TAKE 1 TABLET BY MOUTH TWICE A DAY 180 tablet 2   nitroGLYCERIN (NITROSTAT) 0.4 MG SL tablet Place 1 tablet (0.4 mg total) under the tongue every 5 (five) minutes as needed for  chest pain (up to 3 doses). 25 tablet 3   nystatin-triamcinolone ointment (MYCOLOG) Apply 1 Application topically as needed.     Olopatadine HCl (PATADAY) 0.2 % SOLN Place 1 drop into both eyes 1 day or 1 dose. 8 mL 1   Olopatadine-Mometasone (RYALTRIS) 665-25 MCG/ACT SUSP Place 2 sprays into the nose in the morning and at bedtime. 30 g 5   Propylene Glycol, PF, (SYSTANE COMPLETE PF) 0.6 % SOLN Apply 1 drop to eye every 4 (four) hours as needed. 30 mL 1   TRADJENTA 5 MG TABS tablet Take 5 mg by mouth daily.     zaleplon (SONATA) 10 MG capsule Take 20 mg by mouth at bedtime.  5   No current facility-administered medications for this visit.    REVIEW OF SYSTEMS:  A comprehensive review of systems was negative.   PHYSICAL EXAMINATION: General appearance: alert, cooperative, and no distress Head: Normocephalic, without obvious abnormality, atraumatic Neck: no adenopathy Lymph nodes: Cervical, supraclavicular, and axillary nodes normal. Resp: clear to auscultation bilaterally Back: symmetric, no curvature. ROM normal. No CVA tenderness. Cardio: regular rate and rhythm, S1, S2 normal, no murmur, click, rub or gallop GI: soft, non-tender; bowel sounds normal; no masses,  no organomegaly Extremities: extremities normal, atraumatic, no cyanosis or edema    ECOG PERFORMANCE STATUS: 1 - Symptomatic but completely ambulatory  Blood pressure 130/86, pulse 72, temperature 98 F (36.7 C), temperature source Oral, resp. rate 18, height 6\' 1"  (1.854 m), weight 191 lb 6.4 oz (86.8 kg), SpO2 99%.  LABORATORY DATA: Lab Results  Component Value Date   WBC 8.0 01/16/2023   HGB 16.0 01/16/2023   HCT 45.1 01/16/2023   MCV 91.5 01/16/2023   PLT 222 01/16/2023      Chemistry      Component Value Date/Time   NA 136 01/16/2023 1247   NA 139 06/04/2017 0948   K 4.4 01/16/2023 1247   K 5.1 06/04/2017 0948   CL 105 01/16/2023 1247   CL 106 06/10/2012 0900   CO2 26 01/16/2023 1247   CO2 26  06/04/2017 0948   BUN 22 01/16/2023 1247   BUN 14.2 06/04/2017 0948   CREATININE 1.76 (H) 01/16/2023 1247   CREATININE 1.5 (H) 06/04/2017 0948      Component Value Date/Time   CALCIUM 9.1 01/16/2023 1247   CALCIUM 9.6 06/04/2017 0948   ALKPHOS 84 01/16/2023 1247   ALKPHOS 84 06/04/2017 0948   AST 21 01/16/2023 1247   AST 24 06/04/2017 0948   ALT 21 01/16/2023 1247   ALT 35 06/04/2017 0948   BILITOT 1.2 01/16/2023 1247   BILITOT  0.82 06/04/2017 0948       RADIOGRAPHIC STUDIES: No results found.   ASSESSMENT/PLAN:  This is a very pleasant 72 years old white male with metastatic non-small cell lung cancer, adenocarcinoma with positive EGFR mutation and he is currently on treatment with Tarceva since 2006.  This was a switch of 8 months ago to the generic Erlotinib and he is tolerating it well except for few episodes of diarrhea and mild skin rash.  Since the switch to the generic Erlotinib, the patient has been complaining of worsening rash conjunctivitis as well as diarrhea that already improved. He also underwent SBRT to the enlarging left upper lobe lung nodule. His treatment was switched to erlotinib 100 mg p.o. twice daily 24 months ago. He has been tolerating this treatment well with no concerning adverse effects.  I recommended for him to continue his current treatment with Erlotinib and I will send refill to his pharmacy today. I will see him back for follow-up visit in 3 months for evaluation with repeat blood work as well as CT scan of the chest for restaging of his disease. The patient was advised to call immediately if he has any other concerning symptoms in the interval.  All questions were answered. The patient knows to call the clinic with any problems, questions or concerns. We can certainly see the patient much sooner if necessary.  Disclaimer: This note was dictated with voice recognition software. Similar sounding words can inadvertently be transcribed and may not  be corrected upon review.

## 2023-04-20 NOTE — Telephone Encounter (Signed)
Yes I think if he's been taking it, tolerating it, and not having angina, let's refill it and keep him on it. thx

## 2023-04-23 MED ORDER — ISOSORBIDE MONONITRATE ER 60 MG PO TB24: 60.0000 mg | ORAL_TABLET | Freq: Every day | ORAL | 3 refills | Status: DC

## 2023-04-23 NOTE — Telephone Encounter (Signed)
Year prescription sent to [facility] on file per [facility]. Called and left message letting pt know.

## 2023-04-25 ENCOUNTER — Ambulatory Visit: Payer: Medicare Other | Admitting: Internal Medicine

## 2023-04-25 ENCOUNTER — Other Ambulatory Visit: Payer: Medicare Other

## 2023-05-15 ENCOUNTER — Telehealth: Payer: Self-pay | Admitting: Internal Medicine

## 2023-05-15 NOTE — Telephone Encounter (Signed)
Patient called to confirm November/December appointments, patient is notified of upcoming appointments.

## 2023-05-22 ENCOUNTER — Ambulatory Visit: Payer: Medicare Other | Admitting: Allergy and Immunology

## 2023-05-22 ENCOUNTER — Encounter: Payer: Self-pay | Admitting: Allergy and Immunology

## 2023-05-22 ENCOUNTER — Other Ambulatory Visit: Payer: Self-pay

## 2023-05-22 VITALS — BP 138/86 | HR 84 | Temp 98.1°F | Resp 16 | Ht 73.0 in | Wt 194.8 lb

## 2023-05-22 DIAGNOSIS — R442 Other hallucinations: Secondary | ICD-10-CM

## 2023-05-22 DIAGNOSIS — J3089 Other allergic rhinitis: Secondary | ICD-10-CM | POA: Diagnosis not present

## 2023-05-22 DIAGNOSIS — H10503 Unspecified blepharoconjunctivitis, bilateral: Secondary | ICD-10-CM | POA: Diagnosis not present

## 2023-05-22 MED ORDER — DOXYCYCLINE MONOHYDRATE 100 MG PO CAPS: 100.0000 mg | ORAL_CAPSULE | Freq: Every day | ORAL | 5 refills | Status: DC

## 2023-05-22 MED ORDER — SYSTANE COMPLETE PF 0.6 % OP SOLN: 1.0000 [drp] | OPHTHALMIC | 1 refills | Status: AC | PRN

## 2023-05-22 MED ORDER — RYALTRIS 665-25 MCG/ACT NA SUSP: 2.0000 | Freq: Two times a day (BID) | NASAL | 5 refills | Status: DC

## 2023-05-22 MED ORDER — LOTEPREDNOL ETABONATE 0.5 % OP SUSP: 1.0000 [drp] | Freq: Every day | OPHTHALMIC | 5 refills | Status: DC

## 2023-05-22 MED ORDER — OLOPATADINE HCL 0.2 % OP SOLN: 1.0000 [drp] | Freq: Every day | OPHTHALMIC | 1 refills | Status: DC

## 2023-05-22 MED ORDER — LEVOCETIRIZINE DIHYDROCHLORIDE 5 MG PO TABS: 5.0000 mg | ORAL_TABLET | Freq: Every day | ORAL | 1 refills | Status: DC | PRN

## 2023-05-22 MED ORDER — IPRATROPIUM BROMIDE 0.06 % NA SOLN: 2.0000 | Freq: Four times a day (QID) | NASAL | 5 refills | Status: DC | PRN

## 2023-05-22 NOTE — Progress Notes (Unsigned)
[place] [facility] [place] - [place] - [place] - [place]   Follow-up Note  Referring Provider: Charlane Ferretti, DO Primary Provider: Charlane Ferretti, DO Date of Office Visit: 05/22/2023  Subjective:   [name] (DOB: [birth year]) is a 72 y.o. male who [facility] in re-evaluation of the following:  HPI: [name] returns to this clinic in evaluation of allergic rhinoconjunctivitis, blepharoconjunctivitis, phantosmia, and a history of pityriasis rosea.  I last saw him in this clinic 21 November 2022.  He has done pretty well with his nose and rarely does he have any phantosmia and he is able to smell without much difficulty while using a nasal steroid and nasal antihistamine combination a few times a week.  He does have a lot of runny nose and he is only using his nasal ipratropium once a day.  He has developed a little bit more irritation with his eyes recently.  He has had a little bit more of an opportunity to rub his eyes lately.  He is not using his Pataday.  He does continue on doxycycline.  He has received a flu vaccine and a COVID-vaccine.   Allergies as of 05/22/2023       Reactions   Iohexol Other (See Comments)    Code: HIVES, Desc: pt recieves 50mg  benadryl 1 hour prior to scan   Ivp Dye [iodinated Contrast Media] Rash   Morphine And Codeine Anxiety   hallucinations   Other Rash   chlorascrub when starting IV   Povidone Iodine Rash   Other reaction(s): heated rash        Medication List    Accu-Chek Aviva Plus test strip Generic drug: glucose blood 1 each daily.   aspirin EC 81 MG tablet Take 81 mg by mouth daily. Swallow whole.   atorvastatin 40 MG tablet Commonly known as: LIPITOR Take 40 mg by mouth daily.   clopidogrel 75 MG tablet Commonly known as: PLAVIX TAKE 1 TABLET (75 MG TOTAL) BY MOUTH DAILY.   cyanocobalamin 1000 MCG tablet Take 1,000 mcg by mouth daily.   doxycycline 100 MG capsule Commonly  known as: MONODOX Take 100 mg by mouth daily.   DULoxetine 60 MG capsule Commonly known as: CYMBALTA Take 60 mg by mouth daily.   erlotinib 100 MG tablet Commonly known as: TARCEVA Take 1 tablet (100 mg total) by mouth daily. Take on an empty stomach 1 hour before meals or 2 hours after.   famotidine 40 MG tablet Commonly known as: PEPCID Take 1 tablet (40 mg total) by mouth 2 (two) times daily. Please call [phone] to schedule an office visit for more refills.   freestyle lancets 1 each daily.   glimepiride 2 MG tablet Commonly known as: AMARYL Take 4 mg by mouth every morning.   ipratropium 0.06 % nasal spray Commonly known as: ATROVENT SPRAY 2 SPRAYS INTO EACH NOSTRIL 4 TIMES A DAY AS NEEDED FOR RHINITIS   isosorbide mononitrate 60 MG 24 hr tablet Commonly known as: IMDUR Take 1 tablet (60 mg total) by mouth daily.   Jardiance 25 MG Tabs tablet Generic drug: empagliflozin Take 25 mg by mouth daily.   levocetirizine 5 MG tablet Commonly known as: XYZAL Take 1 tablet (5 mg total) by mouth daily as needed (Can take an extra dose during flare ups.).   levothyroxine 25 MCG tablet Commonly known as: SYNTHROID Take 25 mcg by mouth every morning.   losartan 25 MG tablet Commonly known  as: COZAAR TAKE 1 TABLET BY MOUTH EVERY DAY FOR 90 DAYS   loteprednol 0.5 % ophthalmic suspension Commonly known as: Lotemax Place 1 drop into both eyes daily.   metoprolol tartrate 50 MG tablet Commonly known as: LOPRESSOR TAKE 1 TABLET BY MOUTH TWICE A DAY   nitroGLYCERIN 0.4 MG SL tablet Commonly known as: Nitrostat Place 1 tablet (0.4 mg total) under the tongue every 5 (five) minutes as needed for chest pain (up to 3 doses).   nystatin-triamcinolone ointment Commonly known as: MYCOLOG Apply 1 Application topically as needed.   Olopatadine HCl 0.2 % Soln Commonly known as: Pataday Place 1 drop into both eyes 1 day or 1 dose.   Ryaltris 409-81 MCG/ACT Susp Generic  drug: Olopatadine-Mometasone Place 2 sprays into the nose in the morning and at bedtime.   Systane Complete PF 0.6 % Soln Generic drug: Propylene Glycol (PF) Apply 1 drop to eye every 4 (four) hours as needed.   Tradjenta 5 MG Tabs tablet Generic drug: linagliptin Take 5 mg by mouth daily.   Vitamin D3 75 MCG (3000 UT) Tabs Take 50 mcg by mouth daily. Takes 50 mcg/day   zaleplon 10 MG capsule Commonly known as: SONATA Take 20 mg by mouth at bedtime.    Past Medical History:  Diagnosis Date   AAA (abdominal aortic aneurysm) (HCC)    a. 3.4 x 3.5 on scan 02/28/13.   Allergic reaction to contrast dye    Atherosclerosis    CAD (coronary artery disease)    a. stenting of RCA/LCx 2004. b. [place] 08/2013:  s/p DES to LCx for severe ISR.   Diabetes mellitus    Diverticulosis    Emphysema lung (HCC)    Emphysema of lung (HCC)    Encounter for therapeutic drug monitoring 06/06/2016   GERD (gastroesophageal reflux disease)    Hemorrhoids    Hepatic steatosis    History of radiation therapy 09/07/20-09/22/20   SBRT left lung ; Dr Antony Blackbird   HTN (hypertension)    Hyperlipidemia    Kidney stone    Lung cancer [facility]) dx'd 2004   chemo/xrt comp; tarceva ongoing- January 2022 radiation    Past Surgical History:  Procedure Laterality Date   ANGIOPLASTY / STENTING FEMORAL  08/14/2013   2004-groin   CATARACT EXTRACTION     COLONOSCOPY     LEFT HEART CATHETERIZATION WITH CORONARY ANGIOGRAM N/A 09/08/2013   Procedure: LEFT HEART CATHETERIZATION WITH CORONARY ANGIOGRAM;  Surgeon: Micheline Chapman, MD;  Location: [facility] CATH LAB;  Service: Cardiovascular;  Laterality: N/A;   left partial lobectomy     WISDOM TOOTH EXTRACTION      Review of systems negative except as noted in HPI / PMHx or noted below:  Review of Systems  Constitutional: Negative.   HENT: Negative.    Eyes: Negative.   Respiratory: Negative.    Cardiovascular: Negative.   Gastrointestinal: Negative.   Genitourinary:  Negative.   Musculoskeletal: Negative.   Skin: Negative.   Neurological: Negative.   Endo/Heme/Allergies: Negative.   Psychiatric/Behavioral: Negative.       Objective:   Vitals:   05/22/23 1322  BP: 138/86  Pulse: 84  Resp: 16  Temp: 98.1 F (36.7 C)  SpO2: 96%   Height: 6\' 1"  (185.4 cm)  Weight: 194 lb 12.8 oz (88.4 kg)   Physical Exam Constitutional:      Appearance: He is not diaphoretic.  HENT:     Head: Normocephalic.     Right Ear: Tympanic  membrane, ear canal and external ear normal.     Left Ear: Tympanic membrane, ear canal and external ear normal.     Nose: Nose normal. No mucosal edema or rhinorrhea.     Mouth/Throat:     Pharynx: Uvula midline. No oropharyngeal exudate.  Eyes:     Conjunctiva/sclera: Conjunctivae normal.     Comments: Erythematous indurated lower lid greater than upper lid left eye greater than right eye  Neck:     Thyroid: No thyromegaly.     Trachea: Trachea normal. No tracheal tenderness or tracheal deviation.  Cardiovascular:     Rate and Rhythm: Normal rate and regular rhythm.     Heart sounds: Normal heart sounds, S1 normal and S2 normal. No murmur heard. Pulmonary:     Effort: No respiratory distress.     Breath sounds: Normal breath sounds. No stridor. No wheezing or rales.  Lymphadenopathy:     Head:     Right side of head: No tonsillar adenopathy.     Left side of head: No tonsillar adenopathy.     Cervical: No cervical adenopathy.  Skin:    Findings: No erythema or rash.     Nails: There is no clubbing.  Neurological:     Mental Status: He is alert.     Diagnostics: none  Assessment and Plan:   1. Perennial allergic rhinitis   2. Blepharoconjunctivitis of both eyes, unspecified blepharoconjunctivitis type   3. Phantosmia    1.  Continue Ryaltris - 2 sprays each nostril 3-7 times per week  2.  Continue Doxycycline 100 mg - 1 tablet 1 time per day  3.  If needed:   A. OTC antihistamine  B. OTC Systane eye  drops   C. Pataday - 1 drop each eye 1 time per day  D. Ipratropium 0.06% - 2 sprays each nostril every 6 hours to dry nose  4. During flare up of eyelid inflammation add Lotemax - 1 drop each eye 1 time per day to Pataday - 1 drop each eye 1 time per day  5.  Return in 6 months or earlier if problem  6. Obtain RSV vaccine  [name] has had a flareup of his eye condition and there does not really appear to be any obvious trigger giving rise to this issue.  He will continue on a nasal steroid/antihistamine combination and he will continue on doxycycline and he has a selection of agents to be utilized should they be required as noted above and whenever he flares up his eyelid he can add Lotemax to Pataday and use until clear.  I did inform him that he needs to stop rubbing his eye and he cannot touch or rub his eye ever or he is never gone to clear up his eyes.  Laurette Schimke, MD Allergy / Immunology [facility]

## 2023-05-22 NOTE — Patient Instructions (Signed)
  1.  Continue Ryaltris - 2 sprays each nostril 3-7 times per week  2.  Continue Doxycycline 100 mg - 1 tablet 1 time per day  3.  If needed:   A. OTC antihistamine  B. OTC Systane eye drops   C. Pataday - 1 drop each eye 1 time per day  D. Ipratropium 0.06% - 2 sprays each nostril every 6 hours to dry nose  4. During flare up of eyelid inflammation add Lotemax - 1 drop each eye 1 time per day to Pataday - 1 drop each eye 1 time per day  5.  Return in 6 months or earlier if problem  6. Obtain RSV vaccine

## 2023-05-23 ENCOUNTER — Encounter: Payer: Self-pay | Admitting: Allergy and Immunology

## 2023-07-11 ENCOUNTER — Ambulatory Visit (HOSPITAL_COMMUNITY)
Admission: RE | Admit: 2023-07-11 | Discharge: 2023-07-11 | Disposition: A | Discharge status: Home / Self Care | Payer: Medicare Other | Source: Ambulatory Visit | Attending: Internal Medicine | Admitting: Internal Medicine

## 2023-07-11 ENCOUNTER — Inpatient Hospital Stay: Payer: Medicare Other | Attending: Physician Assistant

## 2023-07-11 DIAGNOSIS — C3412 Malignant neoplasm of upper lobe, left bronchus or lung: Secondary | ICD-10-CM | POA: Diagnosis present

## 2023-07-11 DIAGNOSIS — C3490 Malignant neoplasm of unspecified part of unspecified bronchus or lung: Secondary | ICD-10-CM

## 2023-07-11 LAB — CMP (CANCER CENTER ONLY)
ALT: 19 U/L (ref 0–44)
AST: 17 U/L (ref 15–41)
Albumin: 4 g/dL (ref 3.5–5.0)
Alkaline Phosphatase: 100 U/L (ref 38–126)
Anion gap: 5 (ref 5–15)
BUN: 18 mg/dL (ref 8–23)
CO2: 26 mmol/L (ref 22–32)
Calcium: 9.3 mg/dL (ref 8.9–10.3)
Chloride: 105 mmol/L (ref 98–111)
Creatinine: 1.57 mg/dL — ABNORMAL HIGH (ref 0.61–1.24)
GFR, Estimated: 46 mL/min — ABNORMAL LOW (ref >60)
Glucose, Bld: 230 mg/dL — ABNORMAL HIGH (ref 70–99)
Potassium: 4.3 mmol/L (ref 3.5–5.1)
Sodium: 136 mmol/L (ref 135–145)
Total Bilirubin: 1.2 mg/dL — ABNORMAL HIGH (ref <1.2)
Total Protein: 6.9 g/dL (ref 6.5–8.1)

## 2023-07-11 LAB — CBC WITH DIFFERENTIAL (CANCER CENTER ONLY)
Abs Immature Granulocytes: 0.03 10*3/uL (ref 0.00–0.07)
Basophils Absolute: 0.1 10*3/uL (ref 0.0–0.1)
Basophils Relative: 1 %
Eosinophils Absolute: 0.3 10*3/uL (ref 0.0–0.5)
Eosinophils Relative: 3 %
HCT: 45.4 % (ref 39.0–52.0)
Hemoglobin: 16.1 g/dL (ref 13.0–17.0)
Immature Granulocytes: 0 %
Lymphocytes Relative: 20 %
Lymphs Abs: 2.1 10*3/uL (ref 0.7–4.0)
MCH: 33.1 pg (ref 26.0–34.0)
MCHC: 35.5 g/dL (ref 30.0–36.0)
MCV: 93.2 fL (ref 80.0–100.0)
Monocytes Absolute: 0.9 10*3/uL (ref 0.1–1.0)
Monocytes Relative: 9 %
Neutro Abs: 7.2 10*3/uL (ref 1.7–7.7)
Neutrophils Relative %: 67 %
Platelet Count: 201 10*3/uL (ref 150–400)
RBC: 4.87 MIL/uL (ref 4.22–5.81)
RDW: 13.9 % (ref 11.5–15.5)
WBC Count: 10.5 10*3/uL (ref 4.0–10.5)
nRBC: 0 % (ref 0.0–0.2)

## 2023-07-16 ENCOUNTER — Telehealth: Payer: Self-pay | Admitting: Medical Oncology

## 2023-07-16 NOTE — Telephone Encounter (Signed)
Ok for Prolia? Dr Thornell Mule did Bone Density scan and based on results he wants to start pt on Prolia.

## 2023-07-19 NOTE — Telephone Encounter (Signed)
Per pharmacist, I told pt that there are no issues between taking the Prolia and Tarceva  together.

## 2023-07-20 ENCOUNTER — Telehealth: Payer: Self-pay | Admitting: Internal Medicine

## 2023-07-20 NOTE — Telephone Encounter (Signed)
Left patient a message in regards to rescheduled appointment times/dates due to provider Dr. Kerry Fort being out of office

## 2023-07-25 ENCOUNTER — Other Ambulatory Visit (HOSPITAL_COMMUNITY): Payer: Self-pay | Admitting: *Deleted

## 2023-07-25 ENCOUNTER — Ambulatory Visit (HOSPITAL_COMMUNITY)
Admission: RE | Admit: 2023-07-25 | Discharge: 2023-07-25 | Disposition: A | Discharge status: Home / Self Care | Payer: Medicare Other | Source: Ambulatory Visit | Attending: Internal Medicine | Admitting: Internal Medicine

## 2023-07-25 DIAGNOSIS — M810 Age-related osteoporosis without current pathological fracture: Secondary | ICD-10-CM | POA: Diagnosis present

## 2023-07-25 MED ORDER — DENOSUMAB 60 MG/ML ~~LOC~~ SOSY
PREFILLED_SYRINGE | SUBCUTANEOUS | Status: AC
  Administered 2023-07-25: 60 mg via SUBCUTANEOUS
  Filled 2023-07-25: qty 1

## 2023-07-25 MED ORDER — DENOSUMAB 60 MG/ML ~~LOC~~ SOSY: 60.0000 mg | PREFILLED_SYRINGE | Freq: Once | SUBCUTANEOUS | Status: AC

## 2023-07-26 ENCOUNTER — Ambulatory Visit (HOSPITAL_COMMUNITY): Admission: RE | Admit: 2023-07-26 | Payer: Medicare Other | Source: Ambulatory Visit

## 2023-07-26 ENCOUNTER — Ambulatory Visit: Payer: Medicare Other | Admitting: Internal Medicine

## 2023-08-12 ENCOUNTER — Inpatient Hospital Stay (HOSPITAL_COMMUNITY)
Admission: EM | Admit: 2023-08-12 | Discharge: 2023-08-14 | DRG: 083 | Disposition: A | Discharge status: Home / Self Care | Payer: Medicare Other | Attending: Family Medicine | Admitting: Family Medicine

## 2023-08-12 ENCOUNTER — Emergency Department (HOSPITAL_COMMUNITY): Payer: Medicare Other

## 2023-08-12 ENCOUNTER — Observation Stay (HOSPITAL_COMMUNITY): Payer: Medicare Other

## 2023-08-12 DIAGNOSIS — Z883 Allergy status to other anti-infective agents status: Secondary | ICD-10-CM

## 2023-08-12 DIAGNOSIS — Z8249 Family history of ischemic heart disease and other diseases of the circulatory system: Secondary | ICD-10-CM

## 2023-08-12 DIAGNOSIS — E039 Hypothyroidism, unspecified: Secondary | ICD-10-CM

## 2023-08-12 DIAGNOSIS — W06XXXA Fall from bed, initial encounter: Secondary | ICD-10-CM | POA: Diagnosis present

## 2023-08-12 DIAGNOSIS — S066XAA Traumatic subarachnoid hemorrhage with loss of consciousness status unknown, initial encounter: Secondary | ICD-10-CM | POA: Diagnosis not present

## 2023-08-12 DIAGNOSIS — Z7982 Long term (current) use of aspirin: Secondary | ICD-10-CM

## 2023-08-12 DIAGNOSIS — Z955 Presence of coronary angioplasty implant and graft: Secondary | ICD-10-CM

## 2023-08-12 DIAGNOSIS — S0990XA Unspecified injury of head, initial encounter: Secondary | ICD-10-CM

## 2023-08-12 DIAGNOSIS — I10 Essential (primary) hypertension: Secondary | ICD-10-CM | POA: Diagnosis present

## 2023-08-12 DIAGNOSIS — Z87891 Personal history of nicotine dependence: Secondary | ICD-10-CM

## 2023-08-12 DIAGNOSIS — Z95820 Peripheral vascular angioplasty status with implants and grafts: Secondary | ICD-10-CM

## 2023-08-12 DIAGNOSIS — R55 Syncope and collapse: Secondary | ICD-10-CM | POA: Diagnosis not present

## 2023-08-12 DIAGNOSIS — I609 Nontraumatic subarachnoid hemorrhage, unspecified: Secondary | ICD-10-CM | POA: Diagnosis not present

## 2023-08-12 DIAGNOSIS — D72829 Elevated white blood cell count, unspecified: Secondary | ICD-10-CM | POA: Diagnosis present

## 2023-08-12 DIAGNOSIS — R402363 Coma scale, best motor response, obeys commands, at hospital admission: Secondary | ICD-10-CM | POA: Diagnosis present

## 2023-08-12 DIAGNOSIS — Z7902 Long term (current) use of antithrombotics/antiplatelets: Secondary | ICD-10-CM

## 2023-08-12 DIAGNOSIS — I951 Orthostatic hypotension: Secondary | ICD-10-CM | POA: Diagnosis present

## 2023-08-12 DIAGNOSIS — Z808 Family history of malignant neoplasm of other organs or systems: Secondary | ICD-10-CM

## 2023-08-12 DIAGNOSIS — G4700 Insomnia, unspecified: Secondary | ICD-10-CM | POA: Diagnosis present

## 2023-08-12 DIAGNOSIS — R410 Disorientation, unspecified: Secondary | ICD-10-CM | POA: Diagnosis not present

## 2023-08-12 DIAGNOSIS — R402253 Coma scale, best verbal response, oriented, at hospital admission: Secondary | ICD-10-CM | POA: Diagnosis present

## 2023-08-12 DIAGNOSIS — Z8041 Family history of malignant neoplasm of ovary: Secondary | ICD-10-CM

## 2023-08-12 DIAGNOSIS — Z9221 Personal history of antineoplastic chemotherapy: Secondary | ICD-10-CM

## 2023-08-12 DIAGNOSIS — E1122 Type 2 diabetes mellitus with diabetic chronic kidney disease: Secondary | ICD-10-CM | POA: Diagnosis present

## 2023-08-12 DIAGNOSIS — J439 Emphysema, unspecified: Secondary | ICD-10-CM | POA: Diagnosis present

## 2023-08-12 DIAGNOSIS — I714 Abdominal aortic aneurysm, without rupture, unspecified: Secondary | ICD-10-CM | POA: Diagnosis present

## 2023-08-12 DIAGNOSIS — R4701 Aphasia: Secondary | ICD-10-CM | POA: Diagnosis present

## 2023-08-12 DIAGNOSIS — N1832 Chronic kidney disease, stage 3b: Secondary | ICD-10-CM | POA: Diagnosis present

## 2023-08-12 DIAGNOSIS — D6832 Hemorrhagic disorder due to extrinsic circulating anticoagulants: Secondary | ICD-10-CM | POA: Diagnosis present

## 2023-08-12 DIAGNOSIS — R471 Dysarthria and anarthria: Secondary | ICD-10-CM | POA: Diagnosis present

## 2023-08-12 DIAGNOSIS — Z923 Personal history of irradiation: Secondary | ICD-10-CM

## 2023-08-12 DIAGNOSIS — Z79899 Other long term (current) drug therapy: Secondary | ICD-10-CM

## 2023-08-12 DIAGNOSIS — I130 Hypertensive heart and chronic kidney disease with heart failure and stage 1 through stage 4 chronic kidney disease, or unspecified chronic kidney disease: Secondary | ICD-10-CM | POA: Diagnosis present

## 2023-08-12 DIAGNOSIS — Z833 Family history of diabetes mellitus: Secondary | ICD-10-CM

## 2023-08-12 DIAGNOSIS — T45525A Adverse effect of antithrombotic drugs, initial encounter: Secondary | ICD-10-CM | POA: Diagnosis present

## 2023-08-12 DIAGNOSIS — C3490 Malignant neoplasm of unspecified part of unspecified bronchus or lung: Secondary | ICD-10-CM | POA: Diagnosis present

## 2023-08-12 DIAGNOSIS — R402143 Coma scale, eyes open, spontaneous, at hospital admission: Secondary | ICD-10-CM | POA: Diagnosis present

## 2023-08-12 DIAGNOSIS — Z87442 Personal history of urinary calculi: Secondary | ICD-10-CM

## 2023-08-12 DIAGNOSIS — E785 Hyperlipidemia, unspecified: Secondary | ICD-10-CM | POA: Diagnosis present

## 2023-08-12 DIAGNOSIS — Y92013 Bedroom of single-family (private) house as the place of occurrence of the external cause: Secondary | ICD-10-CM

## 2023-08-12 DIAGNOSIS — I5032 Chronic diastolic (congestive) heart failure: Secondary | ICD-10-CM | POA: Diagnosis present

## 2023-08-12 DIAGNOSIS — Z79890 Hormone replacement therapy (postmenopausal): Secondary | ICD-10-CM

## 2023-08-12 DIAGNOSIS — Z7984 Long term (current) use of oral hypoglycemic drugs: Secondary | ICD-10-CM

## 2023-08-12 DIAGNOSIS — R197 Diarrhea, unspecified: Secondary | ICD-10-CM | POA: Diagnosis present

## 2023-08-12 DIAGNOSIS — I2510 Atherosclerotic heart disease of native coronary artery without angina pectoris: Secondary | ICD-10-CM | POA: Diagnosis present

## 2023-08-12 DIAGNOSIS — K760 Fatty (change of) liver, not elsewhere classified: Secondary | ICD-10-CM | POA: Diagnosis present

## 2023-08-12 DIAGNOSIS — Z85118 Personal history of other malignant neoplasm of bronchus and lung: Secondary | ICD-10-CM

## 2023-08-12 DIAGNOSIS — K219 Gastro-esophageal reflux disease without esophagitis: Secondary | ICD-10-CM | POA: Diagnosis present

## 2023-08-12 DIAGNOSIS — E1169 Type 2 diabetes mellitus with other specified complication: Secondary | ICD-10-CM | POA: Diagnosis present

## 2023-08-12 DIAGNOSIS — Z91041 Radiographic dye allergy status: Secondary | ICD-10-CM

## 2023-08-12 HISTORY — DX: Cerebral infarction, unspecified: I63.9

## 2023-08-12 LAB — APTT: aPTT: 29 s (ref 24–36)

## 2023-08-12 LAB — COMPREHENSIVE METABOLIC PANEL
ALT: 30 U/L (ref 0–44)
AST: 41 U/L (ref 15–41)
Albumin: 3.5 g/dL (ref 3.5–5.0)
Alkaline Phosphatase: 76 U/L (ref 38–126)
Anion gap: 11 (ref 5–15)
BUN: 13 mg/dL (ref 8–23)
CO2: 20 mmol/L — ABNORMAL LOW (ref 22–32)
Calcium: 8.1 mg/dL — ABNORMAL LOW (ref 8.9–10.3)
Chloride: 106 mmol/L (ref 98–111)
Creatinine, Ser: 1.45 mg/dL — ABNORMAL HIGH (ref 0.61–1.24)
GFR, Estimated: 51 mL/min — ABNORMAL LOW (ref >60)
Glucose, Bld: 169 mg/dL — ABNORMAL HIGH (ref 70–99)
Potassium: 5.1 mmol/L (ref 3.5–5.1)
Sodium: 137 mmol/L (ref 135–145)
Total Bilirubin: 1.9 mg/dL — ABNORMAL HIGH (ref <1.2)
Total Protein: 6.2 g/dL — ABNORMAL LOW (ref 6.5–8.1)

## 2023-08-12 LAB — I-STAT CHEM 8, ED
BUN: 17 mg/dL (ref 8–23)
Calcium, Ion: 1 mmol/L — ABNORMAL LOW (ref 1.15–1.40)
Chloride: 107 mmol/L (ref 98–111)
Creatinine, Ser: 1.5 mg/dL — ABNORMAL HIGH (ref 0.61–1.24)
Glucose, Bld: 169 mg/dL — ABNORMAL HIGH (ref 70–99)
HCT: 44 % (ref 39.0–52.0)
Hemoglobin: 15 g/dL (ref 13.0–17.0)
Potassium: 5.6 mmol/L — ABNORMAL HIGH (ref 3.5–5.1)
Sodium: 139 mmol/L (ref 135–145)
TCO2: 23 mmol/L (ref 22–32)

## 2023-08-12 LAB — URINALYSIS, ROUTINE W REFLEX MICROSCOPIC
Bacteria, UA: NONE SEEN
Bilirubin Urine: NEGATIVE
Glucose, UA: >=500 mg/dL — AB
Hgb urine dipstick: NEGATIVE
Ketones, ur: NEGATIVE mg/dL
Leukocytes,Ua: NEGATIVE
Nitrite: NEGATIVE
Protein, ur: NEGATIVE mg/dL
Specific Gravity, Urine: 1.028 (ref 1.005–1.030)
pH: 6 (ref 5.0–8.0)

## 2023-08-12 LAB — DIFFERENTIAL
Abs Immature Granulocytes: 0.08 10*3/uL — ABNORMAL HIGH (ref 0.00–0.07)
Basophils Absolute: 0.1 10*3/uL (ref 0.0–0.1)
Basophils Relative: 0 %
Eosinophils Absolute: 0.3 10*3/uL (ref 0.0–0.5)
Eosinophils Relative: 2 %
Immature Granulocytes: 1 %
Lymphocytes Relative: 18 %
Lymphs Abs: 2.7 10*3/uL (ref 0.7–4.0)
Monocytes Absolute: 1.2 10*3/uL — ABNORMAL HIGH (ref 0.1–1.0)
Monocytes Relative: 8 %
Neutro Abs: 10.9 10*3/uL — ABNORMAL HIGH (ref 1.7–7.7)
Neutrophils Relative %: 71 %

## 2023-08-12 LAB — CBG MONITORING, ED
Glucose-Capillary: 177 mg/dL — ABNORMAL HIGH (ref 70–99)
Glucose-Capillary: 141 mg/dL — ABNORMAL HIGH (ref 70–99)
Glucose-Capillary: 116 mg/dL — ABNORMAL HIGH (ref 70–99)
Glucose-Capillary: 147 mg/dL — ABNORMAL HIGH (ref 70–99)

## 2023-08-12 LAB — CBC
HCT: 46 % (ref 39.0–52.0)
Hemoglobin: 15.9 g/dL (ref 13.0–17.0)
MCH: 33 pg (ref 26.0–34.0)
MCHC: 34.6 g/dL (ref 30.0–36.0)
MCV: 95.4 fL (ref 80.0–100.0)
Platelets: 227 10*3/uL (ref 150–400)
RBC: 4.82 MIL/uL (ref 4.22–5.81)
RDW: 14.3 % (ref 11.5–15.5)
WBC: 15.2 10*3/uL — ABNORMAL HIGH (ref 4.0–10.5)
nRBC: 0 % (ref 0.0–0.2)

## 2023-08-12 LAB — TSH: TSH: 1.567 u[IU]/mL (ref 0.350–4.500)

## 2023-08-12 LAB — PROTIME-INR
INR: 1 (ref 0.8–1.2)
Prothrombin Time: 13.3 s (ref 11.4–15.2)

## 2023-08-12 LAB — MAGNESIUM: Magnesium: 1.5 mg/dL — ABNORMAL LOW (ref 1.7–2.4)

## 2023-08-12 LAB — ETHANOL: Alcohol, Ethyl (B): <10 mg/dL (ref <10)

## 2023-08-12 LAB — CK: Total CK: 94 U/L (ref 49–397)

## 2023-08-12 MED ORDER — PREDNISOLONE ACETATE 1 % OP SUSP
1.0000 [drp] | Freq: Every day | OPHTHALMIC | Status: DC
  Administered 2023-08-14: 1 [drp] via OPHTHALMIC
  Filled 2023-08-12 (×2): qty 5

## 2023-08-12 MED ORDER — FLUTICASONE PROPIONATE 50 MCG/ACT NA SUSP
2.0000 | Freq: Every day | NASAL | Status: DC
  Administered 2023-08-14: 2 via NASAL
  Filled 2023-08-12 (×3): qty 16

## 2023-08-12 MED ORDER — DOXYCYCLINE HYCLATE 100 MG PO TABS
100.0000 mg | ORAL_TABLET | Freq: Every day | ORAL | Status: DC
  Administered 2023-08-12 – 2023-08-14 (×3): 100 mg via ORAL
  Filled 2023-08-12 (×3): qty 1

## 2023-08-12 MED ORDER — ZOLPIDEM TARTRATE 5 MG PO TABS: 5.0000 mg | ORAL_TABLET | Freq: Every evening | ORAL | Status: DC | PRN

## 2023-08-12 MED ORDER — OLOPATADINE HCL 0.1 % OP SOLN
1.0000 [drp] | Freq: Every day | OPHTHALMIC | Status: DC
  Administered 2023-08-14: 1 [drp] via OPHTHALMIC
  Filled 2023-08-12 (×2): qty 5

## 2023-08-12 MED ORDER — ATORVASTATIN CALCIUM 40 MG PO TABS
40.0000 mg | ORAL_TABLET | Freq: Every day | ORAL | Status: DC
  Administered 2023-08-12 – 2023-08-14 (×3): 40 mg via ORAL
  Filled 2023-08-12 (×3): qty 1

## 2023-08-12 MED ORDER — ACETAMINOPHEN 650 MG RE SUPP: 650.0000 mg | Freq: Four times a day (QID) | RECTAL | Status: DC | PRN

## 2023-08-12 MED ORDER — FAMOTIDINE 20 MG PO TABS: 40.0000 mg | ORAL_TABLET | Freq: Two times a day (BID) | ORAL | Status: DC

## 2023-08-12 MED ORDER — SODIUM CHLORIDE 0.9% FLUSH
3.0000 mL | Freq: Once | INTRAVENOUS | Status: AC
  Administered 2023-08-12: 3 mL via INTRAVENOUS

## 2023-08-12 MED ORDER — LINAGLIPTIN 5 MG PO TABS
5.0000 mg | ORAL_TABLET | Freq: Every day | ORAL | Status: DC
  Administered 2023-08-12 – 2023-08-14 (×3): 5 mg via ORAL
  Filled 2023-08-12 (×3): qty 1

## 2023-08-12 MED ORDER — DULOXETINE HCL 60 MG PO CPEP
60.0000 mg | ORAL_CAPSULE | Freq: Every evening | ORAL | Status: DC
  Administered 2023-08-12 – 2023-08-13 (×2): 60 mg via ORAL
  Filled 2023-08-12: qty 2
  Filled 2023-08-12: qty 1

## 2023-08-12 MED ORDER — MAGNESIUM SULFATE 2 GM/50ML IV SOLN
2.0000 g | Freq: Once | INTRAVENOUS | Status: AC
  Administered 2023-08-12: 2 g via INTRAVENOUS
  Filled 2023-08-12: qty 50

## 2023-08-12 MED ORDER — LOTEPREDNOL ETABONATE 0.5 % OP SUSP
1.0000 [drp] | Freq: Every day | OPHTHALMIC | Status: DC
  Filled 2023-08-12: qty 5

## 2023-08-12 MED ORDER — ACETAMINOPHEN 500 MG PO TABS
1000.0000 mg | ORAL_TABLET | Freq: Once | ORAL | Status: AC
  Administered 2023-08-12: 1000 mg via ORAL
  Filled 2023-08-12: qty 2

## 2023-08-12 MED ORDER — FAMOTIDINE 20 MG PO TABS
20.0000 mg | ORAL_TABLET | Freq: Two times a day (BID) | ORAL | Status: DC
  Administered 2023-08-12 – 2023-08-14 (×4): 20 mg via ORAL
  Filled 2023-08-12 (×4): qty 1

## 2023-08-12 MED ORDER — LEVOTHYROXINE SODIUM 25 MCG PO TABS: 25.0000 ug | ORAL_TABLET | Freq: Every morning | ORAL | Status: DC

## 2023-08-12 MED ORDER — ERLOTINIB HCL 100 MG PO TABS: 100.0000 mg | ORAL_TABLET | Freq: Every day | ORAL | Status: DC

## 2023-08-12 MED ORDER — LOTEPREDNOL ETABONATE 0.5 % OP SUSP: 1.0000 [drp] | Freq: Every day | OPHTHALMIC | Status: DC

## 2023-08-12 MED ORDER — LEVOTHYROXINE SODIUM 25 MCG PO TABS
25.0000 ug | ORAL_TABLET | Freq: Every day | ORAL | Status: DC
  Administered 2023-08-13 – 2023-08-14 (×2): 25 ug via ORAL
  Filled 2023-08-12 (×2): qty 1

## 2023-08-12 MED ORDER — ONDANSETRON HCL 4 MG/2ML IJ SOLN
4.0000 mg | Freq: Four times a day (QID) | INTRAMUSCULAR | Status: DC | PRN
  Filled 2023-08-12: qty 2

## 2023-08-12 MED ORDER — OLOPATADINE-MOMETASONE 665-25 MCG/ACT NA SUSP: 2.0000 | Freq: Two times a day (BID) | NASAL | Status: DC

## 2023-08-12 MED ORDER — INSULIN ASPART 100 UNIT/ML IJ SOLN
0.0000 [IU] | Freq: Three times a day (TID) | INTRAMUSCULAR | Status: DC
  Administered 2023-08-12 – 2023-08-13 (×2): 1 [IU] via SUBCUTANEOUS

## 2023-08-12 MED ORDER — ONDANSETRON HCL 4 MG PO TABS: 4.0000 mg | ORAL_TABLET | Freq: Four times a day (QID) | ORAL | Status: DC | PRN

## 2023-08-12 MED ORDER — ACETAMINOPHEN 325 MG PO TABS
650.0000 mg | ORAL_TABLET | Freq: Four times a day (QID) | ORAL | Status: DC | PRN
  Administered 2023-08-12 – 2023-08-14 (×3): 650 mg via ORAL
  Filled 2023-08-12 (×4): qty 2

## 2023-08-12 NOTE — Assessment & Plan Note (Signed)
Continue pepcid

## 2023-08-12 NOTE — Assessment & Plan Note (Addendum)
Unwitnessed with no prodromal symptoms ? Orthostasis with diarrhea/poor PO intake and soft BP Orthostatics pending/TED hose  Telemetry monitoring  CT head with SAH, check brain MRI with hx of stage IV lung cancer  No hx of seizures and no signs of seizure, low threshold for EEG Check mag/CK and troponin  Echo in 12/2022 with no aortic stenosis. Moderate AI, unchanged from previous echo

## 2023-08-12 NOTE — Assessment & Plan Note (Signed)
Check TSH. Continue synthroid 25 mcg daily.

## 2023-08-12 NOTE — Assessment & Plan Note (Signed)
Likely reactive, but has had stomach upset with diarrhea  No stomach pain and no other clinical signs at this time to warrant CT abdomen Trend and follow closely F/u on UA

## 2023-08-12 NOTE — Assessment & Plan Note (Signed)
Creatinine baseline around 1.5 Stable, continue to monitor

## 2023-08-12 NOTE — Assessment & Plan Note (Signed)
Euvolemic Last echo 12/2022 with normal EF and grade 1 DD Daily weights  Hold meds with hypotension

## 2023-08-12 NOTE — Code Documentation (Signed)
Stroke Response Nurse Documentation Code Documentation  [name] is a 72 y.o. male arriving to [facility]  via [facility] on 12/29  with past medical hx of HTN, CAD, COPD, HLD, lung cancer, AAA. On clopidogrel 75 mg daily. Code stroke was activated by EMS.   Patient from home where he was LKW at 0100 and now complaining of confusion, headache, and dizzyness.  Stroke team at the bedside on patient arrival. Labs drawn and patient cleared for CT by Dr. Jacqulyn Bath. Patient to CT with team. NIHSS 0, see documentation for details and code stroke times. Patient with no focal deficits on exam. The following imaging was completed:  CT Head. Patient is not a candidate for IV Thrombolytic due to resolving symptoms. Patient is not a candidate for IR due to low suspicion for Stroke.    Bedside handoff with ED RN Chloe.    Rose Fillers  Rapid Response RN

## 2023-08-12 NOTE — Assessment & Plan Note (Signed)
Continue lipitor 40mg daily. 

## 2023-08-12 NOTE — Assessment & Plan Note (Addendum)
-  remote PCI of the right coronary artery left circumflex vessel in 2004 and repeat stenting of the left circumflex in 2015  -hold DAPT -check troponin in setting of unwitnessed syncope -continue medical management

## 2023-08-12 NOTE — Assessment & Plan Note (Signed)
A1C of 6.3 in 2022, repeat pending  Hold amyaryl, jardiance  Continue tradjenta SSI accuchecks qac/hs

## 2023-08-12 NOTE — Consult Note (Addendum)
NEUROLOGY CONSULT NOTE   Date of service: August 12, 2023 Patient Name: [name] MRN:  [id number] DOB:  Jul 11, 1951 Chief Complaint: "Unresponsive" Requesting Provider: Maia Plan, MD  History of Present Illness  [name] is a 72 y.o. male who  has a past medical history of AAA (abdominal aortic aneurysm) (HCC), Allergic reaction to contrast dye, Atherosclerosis, CAD (coronary artery disease), Diabetes mellitus, Diverticulosis, Emphysema lung (HCC), Emphysema of lung (HCC), Encounter for therapeutic drug monitoring (06/06/2016), GERD (gastroesophageal reflux disease), Hemorrhoids, Hepatic steatosis, History of radiation therapy (09/07/20-09/22/20), HTN (hypertension), Hyperlipidemia, Kidney stone, and Lung cancer (HCC) (dx'd 2004)., history of coronary artery stenting, who presents from home to the ED via EMS as a Code Stroke after wife awoke to the sound of a loud thud this morning then noticed the patient on the ground in an eyes-open unresponsive state. LKN was 0100. When EMS arrived, the patient was confused with speech disturbance consisting of word finding difficulty. No seizure activity seen. No CP or palpitations. Not complaining of lightheadedness or vertigo.    On arrival to the ED, he was conversant and able to move all 4 extremities symmetrically.   When initially interviewed by Neurology, the patient was uncertain regarding what were the first and last things he remembered prior to the above episode, Later, when interviewed by EDP, he recalled getting up and walking to the kitchen to get something to drink and then not remembering anything past that point until he was in the back of the ambulance.   Home medications include Plavix, ASA and atorvastatin.    LKW: 0100 Modified rankin score: 0-Completely asymptomatic and back to baseline post- stroke IV Thrombolysis: Patient is not a candidate for IV Thrombolytic due to resolving symptoms.  EVT: Patient is not a  candidate for IR due to low suspicion for Stroke.    NIHSS components Score: Comment  1a Level of Conscious 0[x]  1[]  2[]  3[]      1b LOC Questions 0[x]  1[]  2[]       1c LOC Commands 0[x]  1[]  2[]       2 Best Gaze 0[x]  1[]  2[]       3 Visual 0[x]  1[]  2[]  3[]      4 Facial Palsy 0[x]  1[]  2[]  3[]      5a Motor Arm - left 0[x]  1[]  2[]  3[]  4[]  UN[]    5b Motor Arm - Right 0[x]  1[]  2[]  3[]  4[]  UN[]    6a Motor Leg - Left 0[x]  1[]  2[]  3[]  4[]  UN[]    6b Motor Leg - Right 0[x]  1[]  2[]  3[]  4[]  UN[]    7 Limb Ataxia 0[x]  1[]  2[]  3[]  UN[]     8 Sensory 0[x]  1[]  2[]  UN[]      9 Best Language 0[x]  1[]  2[]  3[]      10 Dysarthria 0[x]  1[]  2[]  UN[]      11 Extinct. and Inattention 0[x]  1[]  2[]       TOTAL:   0      ROS  Comprehensive ROS performed and pertinent positives documented in HPI    Past History   Past Medical History:  Diagnosis Date   AAA (abdominal aortic aneurysm) (HCC)    a. 3.4 x 3.5 on scan 02/28/13.   Allergic reaction to contrast dye    Atherosclerosis    CAD (coronary artery disease)    a. stenting of RCA/LCx 2004. b. [place] 08/2013:  s/p DES to LCx for severe ISR.   Diabetes mellitus    Diverticulosis    Emphysema lung (HCC)  Emphysema of lung (HCC)    Encounter for therapeutic drug monitoring 06/06/2016   GERD (gastroesophageal reflux disease)    Hemorrhoids    Hepatic steatosis    History of radiation therapy 09/07/20-09/22/20   SBRT left lung ; Dr Antony Blackbird   HTN (hypertension)    Hyperlipidemia    Kidney stone    Lung cancer [facility]) dx'd 2004   chemo/xrt comp; tarceva ongoing- January 2022 radiation    Past Surgical History:  Procedure Laterality Date   ANGIOPLASTY / STENTING FEMORAL  08/14/2013   2004-groin   CATARACT EXTRACTION     COLONOSCOPY     LEFT HEART CATHETERIZATION WITH CORONARY ANGIOGRAM N/A 09/08/2013   Procedure: LEFT HEART CATHETERIZATION WITH CORONARY ANGIOGRAM;  Surgeon: Micheline Chapman, MD;  Location: [facility] CATH LAB;  Service: Cardiovascular;   Laterality: N/A;   left partial lobectomy     WISDOM TOOTH EXTRACTION      Family History: Family History  Problem Relation Age of Onset   Heart disease Sister    Diabetes Sister        oral cancer   Ovarian cancer Sister    Colon cancer Neg Hx    Esophageal cancer Neg Hx    Rectal cancer Neg Hx    Stomach cancer Neg Hx     Social History  reports that he quit smoking about 21 years ago. His smoking use included cigarettes. He has never used smokeless tobacco. He reports that he does not drink alcohol and does not use drugs.  Allergies  Allergen Reactions   Iohexol Other (See Comments)     Code: HIVES, Desc: pt recieves 50mg  benadryl 1 hour prior to scan    Ivp Dye [Iodinated Contrast Media] Rash   Morphine And Codeine Anxiety    hallucinations   Other Rash    chlorascrub when starting IV   Povidone Iodine Rash    Other reaction(s): heated rash    Medications   Current Facility-Administered Medications:    sodium chloride flush (NS) 0.9 % injection 3 mL, 3 mL, Intravenous, Once, Long, Arlyss Repress, MD  Current Outpatient Medications:    ACCU-CHEK AVIVA PLUS test strip, 1 each daily., Disp: , Rfl:    aspirin EC 81 MG tablet, Take 81 mg by mouth daily. Swallow whole., Disp: , Rfl:    atorvastatin (LIPITOR) 40 MG tablet, Take 40 mg by mouth daily., Disp: , Rfl:    Cholecalciferol (VITAMIN D3) 75 MCG (3000 UT) TABS, Take 50 mcg by mouth daily. Takes 50 mcg/day, Disp: , Rfl:    clopidogrel (PLAVIX) 75 MG tablet, TAKE 1 TABLET (75 MG TOTAL) BY MOUTH DAILY., Disp: 90 tablet, Rfl: 2   cyanocobalamin 1000 MCG tablet, Take 1,000 mcg by mouth daily., Disp: , Rfl:    doxycycline (MONODOX) 100 MG capsule, Take 1 capsule (100 mg total) by mouth daily., Disp: 30 capsule, Rfl: 5   DULoxetine (CYMBALTA) 60 MG capsule, Take 60 mg by mouth daily., Disp: , Rfl:    erlotinib (TARCEVA) 100 MG tablet, Take 1 tablet (100 mg total) by mouth daily. Take on an empty stomach 1 hour before meals or 2  hours after., Disp: 90 tablet, Rfl: 1   famotidine (PEPCID) 40 MG tablet, Take 1 tablet (40 mg total) by mouth 2 (two) times daily. Please call [phone] to schedule an office visit for more refills., Disp: 180 tablet, Rfl: 2   glimepiride (AMARYL) 2 MG tablet, Take 4 mg by mouth every morning., Disp: ,  Rfl:    ipratropium (ATROVENT) 0.06 % nasal spray, Place 2 sprays into both nostrils every 6 (six) hours as needed for rhinitis., Disp: 15 mL, Rfl: 5   isosorbide mononitrate (IMDUR) 60 MG 24 hr tablet, Take 1 tablet (60 mg total) by mouth daily., Disp: 90 tablet, Rfl: 3   JARDIANCE 25 MG TABS tablet, Take 25 mg by mouth daily., Disp: , Rfl:    Lancets (FREESTYLE) lancets, 1 each daily., Disp: , Rfl:    levocetirizine (XYZAL) 5 MG tablet, Take 1 tablet (5 mg total) by mouth daily as needed (Can take an extra dose during flare ups.)., Disp: 180 tablet, Rfl: 1   levothyroxine (SYNTHROID) 25 MCG tablet, Take 25 mcg by mouth every morning., Disp: , Rfl:    losartan (COZAAR) 25 MG tablet, TAKE 1 TABLET BY MOUTH EVERY DAY FOR 90 DAYS, Disp: , Rfl:    loteprednol (LOTEMAX) 0.5 % ophthalmic suspension, Place 1 drop into both eyes daily., Disp: 15 mL, Rfl: 5   metoprolol tartrate (LOPRESSOR) 50 MG tablet, TAKE 1 TABLET BY MOUTH TWICE A DAY, Disp: 180 tablet, Rfl: 2   nitroGLYCERIN (NITROSTAT) 0.4 MG SL tablet, Place 1 tablet (0.4 mg total) under the tongue every 5 (five) minutes as needed for chest pain (up to 3 doses)., Disp: 25 tablet, Rfl: 3   nystatin-triamcinolone ointment (MYCOLOG), Apply 1 Application topically as needed., Disp: , Rfl:    Olopatadine HCl (PATADAY) 0.2 % SOLN, Place 1 drop into both eyes daily., Disp: 8 mL, Rfl: 1   Olopatadine-Mometasone (RYALTRIS) 665-25 MCG/ACT SUSP, Place 2 sprays into the nose in the morning and at bedtime., Disp: 30 g, Rfl: 5   Propylene Glycol, PF, (SYSTANE COMPLETE PF) 0.6 % SOLN, Apply 1 drop to eye every 4 (four) hours as needed., Disp: 30 mL, Rfl: 1    TRADJENTA 5 MG TABS tablet, Take 5 mg by mouth daily., Disp: , Rfl:    zaleplon (SONATA) 10 MG capsule, Take 20 mg by mouth at bedtime., Disp: , Rfl: 5  Vitals  There were no vitals filed for this visit.  There is no height or weight on file to calculate BMI.  Physical Exam   Physical Exam  HEENT-  [place]/AT    Lungs- Respirations unlabored Extremities- No edema  Neurological Examination Mental Status: Alert, oriented x 5, thought content appropriate.  Speech fluent without evidence of aphasia.  Able to follow all commands without difficulty. Cranial Nerves: [name]: Temporal visual fields intact with no extinction to DSS. PERRL  III,IV, VI: No ptosis. EOMI.  V: Temp sensation equal bilaterally  VII: Smile symmetric VIII: Hearing intact to voice IX,X: No hoarseness XI: Symmetric shoulder shrug XII: Midline tongue extension Motor: BUE 5/5 proximally and distally BLE 5/5 proximally and distally  No pronator drift.  Sensory: Temp and light touch intact throughout, bilaterally. No extinction to DSS.  Deep Tendon Reflexes: 2+ and symmetric throughout Cerebellar: No ataxia with FNF bilaterally  Gait: Able to stand with own power and ambulate to stretcher from CT table without difficulty  Labs/Imaging/Neurodiagnostic studies   CBC:  Recent Labs  Lab Sep 05, 2023 0705 09-05-2023 0716  WBC 15.2*  --   NEUTROABS 10.9*  --   HGB 15.9 15.0  HCT 46.0 44.0  MCV 95.4  --   PLT 227  --    Basic Metabolic Panel:  Lab Results  Component Value Date   NA 139 September 05, 2023   K 5.6 (H) 2023-09-05   CO2 26 07/11/2023  GLUCOSE 169 (H) 08/12/2023   BUN 17 08/12/2023   CREATININE 1.50 (H) 08/12/2023   CALCIUM 9.3 07/11/2023   GFRNONAA 46 (L) 07/11/2023   GFRAA 39 (L) 12/16/2019   Lipid Panel: No results found for: "LDLCALC" HgbA1c:  Lab Results  Component Value Date   HGBA1C 6.3 (H) 09/26/2020   Urine Drug Screen: No results found for: "LABOPIA", "COCAINSCRNUR", "LABBENZ", "AMPHETMU", "THCU",  "LABBARB"  Alcohol Level No results found for: "ETH" INR  Lab Results  Component Value Date   INR 1.0 08/12/2023   APTT  Lab Results  Component Value Date   APTT 29 08/12/2023     ASSESSMENT  72 year old male who presents from home to the ED via EMS as a Code Stroke after wife awoke to the sound of a loud thud this morning then noticed the patient on the ground in an eyes-open unresponsive state. LKN was 0100. When EMS arrived, the patient was confused with speech disturbance consisting of word finding difficulty. On arrival to the ED, he was conversant. No seizure activity seen. No CP or palpitations. Not complaining of lightheadedness or vertigo.   - Neurological exam is nonfocal - CT head: Positive for bilateral Acute Subarachnoid Hemorrhage. Small to moderate volume relatively sparing the basilar cisterns and sylvian fissures, more pronounced over the convexities, a pattern most resembling sequelae of trauma. Coagulopathy might appear similar. No skull fracture or scalp hematoma identified. - Overall impression: Transient aphasia due to acute traumatic subarachnoid hemorrhages  RECOMMENDATIONS  - Traumatic subarachnoid hemorrhages are admitted to Neurosurgery or Trauma service.  - Has an allergy to iodinated contrast manifesting as hives, classified as low to medium in his Epic allergies list. Can give 50 mg IV Benadryl prior to scans requiring iodinated contrast.  - CTA head is recommended to assess for possible aneurysm - May benefit from nimodipine x 21 days, but the subarachnoid hemorrhages are relatively small and not all small SAH are treated with nimodipine.  - BP management to SBP goal of < 160 - Hold ASA and Plavix - DVT prophylaxis with SCDs - Discussed with EDP - Neurohospitalist service will be available PRN.   ______________________________________________________________________    Dessa Phi, Anab Vivar, MD [facility]

## 2023-08-12 NOTE — Assessment & Plan Note (Addendum)
72 year old presenting to ED after unwitnessed fall, found by wife on ground and not responsive found to have bilateral SAH -obs to progressive -initially called as a code stroke,seen by neurology but signing off -neurosurgery consulted, will follow. Repeat head CT in 24 hours -plavix dose on 12/28 in the AM. Hold. Resume per neurosurgery  -hold ASA  -blood pressure control  -frequent neuro checks -seizure precautions

## 2023-08-12 NOTE — H&P (Addendum)
History and Physical    Patient: [name] NUU:[id number] DOB: 10/04/1950 DOA: 08/12/2023 DOS: the patient was seen and examined on 08/12/2023 PCP: Charlane Ferretti, DO  Patient coming from: Home - lives with  his wife    Chief Complaint: unwitnessed fall/confusion/speech issues   HPI: [name] is a 72 y.o. male with medical history significant of   hypertension, hyperlipidemia, COPD, GERD, coronary artery disease (PCI to RCA and LCx in 2004, stent to LCx in 2015), diastolic CHF (EF 34-74% 05/2017), T2DM and adenocarcinoma of the lung (Dx 2004) who presented to ED with code stroke. He got up in the night to get something to drink and doesn't remember anything past this until he woke up in the ambulance. His wife states she heard a thud and he was on the ground in his room. She denies any shaking, urinary incontinence. He was not really responding fro several minutes. When EMS got there he had some word difficulty finding issues and some confusion.   He tells me he hasn't been feeling well for 3 days with some diarrhea. No stomach pain, fever/chills. No nausea/vomiting. Around 5:30 he remembers going to get a drink in the kitchen and then his wife heard a thud in his bedroom and found him on the floor. He didn't respond to her so she called EMS. Denies any shaking, urinary incontinence, tongue biting. No hx of seizures. He can not recall any prodromal symptoms. Remembers waking up in ambulance. He is on plavix, last took yesterday morning. Only new medication has been prolia. Has had decreased PO intake.     Denies any fever/chills, vision changes/headaches, chest pain or palpitations, shortness of breath or cough, abdominal pain, N/V, dysuria or leg swelling.   He does not smoke or drink alcohol.    ER Course:  vitals: afebrile, bp: 130/64, HR: 75, RR: 17, oxygen: 99%RA Pertinent labs: wbc: 15.2, creatinine: 1.45, t.bili: 1.9,  CT head: . Positive for bilateral Acute  Subarachnoid Hemorrhage. Small to moderate volume relatively sparing the basilar cisterns and sylvian fissures, more pronounced over the convexities, a pattern most resembling sequelae of trauma. Coagulopathy might appear similar.   2. No intracranial mass effect, midline shift, acute cortically based infarct. No IVH or ventriculomegaly.   3. No skull fracture or scalp hematoma identified. In ED: neurosurgery consulted. [facility] asked to admit.    Review of Systems: As mentioned in the history of present illness. All other systems reviewed and are negative. Past Medical History:  Diagnosis Date   AAA (abdominal aortic aneurysm) (HCC)    a. 3.4 x 3.5 on scan 02/28/13.   Allergic reaction to contrast dye    Atherosclerosis    CAD (coronary artery disease)    a. stenting of RCA/LCx 2004. b. [place] 08/2013:  s/p DES to LCx for severe ISR.   Diabetes mellitus    Diverticulosis    Emphysema lung (HCC)    Emphysema of lung (HCC)    Encounter for therapeutic drug monitoring 06/06/2016   GERD (gastroesophageal reflux disease)    Hemorrhoids    Hepatic steatosis    History of radiation therapy 09/07/20-09/22/20   SBRT left lung ; Dr Antony Blackbird   HTN (hypertension)    Hyperlipidemia    Kidney stone    Lung cancer [facility]) dx'd 2004   chemo/xrt comp; tarceva ongoing- January 2022 radiation   Past Surgical History:  Procedure Laterality Date   ANGIOPLASTY / STENTING FEMORAL  08/14/2013   2004-groin  CATARACT EXTRACTION     COLONOSCOPY     LEFT HEART CATHETERIZATION WITH CORONARY ANGIOGRAM N/A 09/08/2013   Procedure: LEFT HEART CATHETERIZATION WITH CORONARY ANGIOGRAM;  Surgeon: Micheline Chapman, MD;  Location: [facility] CATH LAB;  Service: Cardiovascular;  Laterality: N/A;   left partial lobectomy     WISDOM TOOTH EXTRACTION     Social History:  reports that he quit smoking about 21 years ago. His smoking use included cigarettes. He has never used smokeless tobacco. He reports that he does not drink  alcohol and does not use drugs.  Allergies  Allergen Reactions   Iohexol Other (See Comments)     Code: HIVES, Desc: pt recieves 50mg  benadryl 1 hour prior to scan    Ivp Dye [Iodinated Contrast Media] Rash   Morphine And Codeine Anxiety    hallucinations   Other Rash    chlorascrub when starting IV   Povidone Iodine Rash    Other reaction(s): heated rash    Family History  Problem Relation Age of Onset   Heart disease Sister    Diabetes Sister        oral cancer   Ovarian cancer Sister    Colon cancer Neg Hx    Esophageal cancer Neg Hx    Rectal cancer Neg Hx    Stomach cancer Neg Hx     Prior to Admission medications   Medication Sig Start Date End Date Taking? Authorizing Provider  ACCU-CHEK AVIVA PLUS test strip 1 each daily. 06/08/20   [provider]  aspirin EC 81 MG tablet Take 81 mg by mouth daily. Swallow whole.    [provider]  atorvastatin (LIPITOR) 40 MG tablet Take 40 mg by mouth daily.    [provider]  Cholecalciferol (VITAMIN D3) 75 MCG (3000 UT) TABS Take 50 mcg by mouth daily. Takes 50 mcg/day    [provider]  clopidogrel (PLAVIX) 75 MG tablet TAKE 1 TABLET (75 MG TOTAL) BY MOUTH DAILY. 08/10/14   Tonny Bollman, MD  cyanocobalamin 1000 MCG tablet Take 1,000 mcg by mouth daily.    [provider]  doxycycline (MONODOX) 100 MG capsule Take 1 capsule (100 mg total) by mouth daily. 05/22/23   Kozlow, Alvira Philips, MD  DULoxetine (CYMBALTA) 60 MG capsule Take 60 mg by mouth daily. 09/15/22   [provider]  erlotinib (TARCEVA) 100 MG tablet Take 1 tablet (100 mg total) by mouth daily. Take on an empty stomach 1 hour before meals or 2 hours after. 04/19/23   Si Gaul, MD  famotidine (PEPCID) 40 MG tablet Take 1 tablet (40 mg total) by mouth 2 (two) times daily. Please call [phone] to schedule an office visit for more refills. 02/12/23   Hilarie Fredrickson, MD  glimepiride (AMARYL) 2 MG tablet Take 4 mg by  mouth every morning. 07/05/21   [provider]  ipratropium (ATROVENT) 0.06 % nasal spray Place 2 sprays into both nostrils every 6 (six) hours as needed for rhinitis. 05/22/23   Kozlow, Alvira Philips, MD  isosorbide mononitrate (IMDUR) 60 MG 24 hr tablet Take 1 tablet (60 mg total) by mouth daily. 04/23/23   Tonny Bollman, MD  JARDIANCE 25 MG TABS tablet Take 25 mg by mouth daily. 06/29/21   [provider]  Lancets (FREESTYLE) lancets 1 each daily. 06/08/20   [provider]  levocetirizine (XYZAL) 5 MG tablet Take 1 tablet (5 mg total) by mouth daily as needed (Can take an extra dose during  flare ups.). 05/22/23   Kozlow, Alvira Philips, MD  levothyroxine (SYNTHROID) 25 MCG tablet Take 25 mcg by mouth every morning. 05/27/22   [provider]  losartan (COZAAR) 25 MG tablet TAKE 1 TABLET BY MOUTH EVERY DAY FOR 90 DAYS    [provider]  loteprednol (LOTEMAX) 0.5 % ophthalmic suspension Place 1 drop into both eyes daily. 05/22/23   Kozlow, Alvira Philips, MD  metoprolol tartrate (LOPRESSOR) 50 MG tablet TAKE 1 TABLET BY MOUTH TWICE A DAY 02/13/23   Tonny Bollman, MD  nitroGLYCERIN (NITROSTAT) 0.4 MG SL tablet Place 1 tablet (0.4 mg total) under the tongue every 5 (five) minutes as needed for chest pain (up to 3 doses). 09/09/13   Dunn, Tacey Ruiz, PA-C  nystatin-triamcinolone ointment (MYCOLOG) Apply 1 Application topically as needed. 04/25/22   [provider]  Olopatadine HCl (PATADAY) 0.2 % SOLN Place 1 drop into both eyes daily. 05/22/23   Kozlow, Alvira Philips, MD  Olopatadine-Mometasone Cristal Generous) [phone] MCG/ACT SUSP Place 2 sprays into the nose in the morning and at bedtime. 05/22/23   Kozlow, Alvira Philips, MD  Propylene Glycol, PF, (SYSTANE COMPLETE PF) 0.6 % SOLN Apply 1 drop to eye every 4 (four) hours as needed. 05/22/23   Kozlow, Alvira Philips, MD  TRADJENTA 5 MG TABS tablet Take 5 mg by mouth daily. 06/01/20   [provider]  zaleplon (SONATA) 10 MG capsule Take 20 mg by  mouth at bedtime. 05/04/18   [provider]    Physical Exam: Vitals:   08/12/23 1145 08/12/23 1215 08/12/23 1635 08/12/23 1718  BP: 117/67  134/71   Pulse: 66  82   Resp: 15  (!) 21   Temp:  97.9 F (36.6 C)  98 F (36.7 C)  TempSrc:  Oral  Oral  SpO2: 94%  98%   Weight:      Height:       General:  Appears calm and comfortable and is in NAD Eyes:  PERRL, EOMI, normal lids, iris ENT:  grossly normal hearing, lips & tongue, mmm; appropriate dentition Neck:  no LAD, masses or thyromegaly; no carotid bruits Cardiovascular:  RRR, no m/r/g. No LE edema.  Respiratory:   CTA bilaterally with no wheezes/rales/rhonchi.  Normal respiratory effort. Abdomen:  soft, NT, ND, NABS Back:   normal alignment, no CVAT Skin:  no rash or induration seen on limited exam Musculoskeletal:  grossly normal tone BUE/BLE, good ROM, no bony abnormality Lower extremity:  No LE edema.  Limited foot exam with no ulcerations.  2+ distal pulses. Psychiatric:  grossly normal mood and affect, speech fluent and appropriate, AOx3 Neurologic:  CN 2-12 grossly intact, moves all extremities in coordinated fashion, sensation intact. Gait deferred.    Radiological Exams on Admission: Independently reviewed - see discussion in A/P where applicable  MR BRAIN WO CONTRAST Result Date: 08/12/2023 CLINICAL DATA:  72 year old male with syncope, code stroke presentation this morning with subarachnoid hemorrhage. EXAM: MRI HEAD WITHOUT CONTRAST TECHNIQUE: Multiplanar, multiecho pulse sequences of the brain and surrounding structures were obtained without intravenous contrast. COMPARISON:  Head CT 0712 hours today. FINDINGS: Brain: Scattered subarachnoid susceptibility on DWI, most pronounced at the vertex, compatible with known blood products. No restricted diffusion or evidence of acute infarction. No midline shift, mass effect, or evidence of intracranial mass lesion. No ventriculomegaly. No intraventricular  hemorrhage. Scattered and layering subarachnoid blood as seen by head CT, most apparent at the bilateral vertex on FLAIR and SWI imaging. No subdural or  extradural hemorrhage or collection. No convincing underlying chronic microhemorrhage or superficial siderosis. And otherwise largely normal for age gray and white matter signal throughout the brain. No definite cortical encephalomalacia. Vascular: Major intracranial vascular flow voids are preserved. Skull and upper cervical spine: Negative visible cervical spine. Visualized bone marrow signal is within normal limits. Sinuses/Orbits: Postoperative changes to both globes. Fluid, mucosal thickening again noted in the maxillary sinuses. Other: Mastoids are well aerated. Grossly normal visible internal auditory structures. Negative visible scalp and face. IMPRESSION: 1. Stable small volume bilateral Subarachnoid Hemorrhage, pattern unchanged from earlier Head CT. No IVH, ventriculomegaly, or other complicating features. 2. No evidence of acute infarct, and otherwise largely normal for age noncontrast MRI appearance of the Brain. Electronically Signed   By: Odessa Fleming M.D.   On: 08/12/2023 11:09   CT HEAD CODE STROKE WO CONTRAST Result Date: 08/12/2023 CLINICAL DATA:  Code stroke.  72 year old male neurologic deficit. EXAM: CT HEAD WITHOUT CONTRAST TECHNIQUE: Contiguous axial images were obtained from the base of the skull through the vertex without intravenous contrast. RADIATION DOSE REDUCTION: This exam was performed according to the departmental dose-optimization program which includes automated exposure control, adjustment of the mA and/or kV according to patient size and/or use of iterative reconstruction technique. COMPARISON:  Head CT 02/11/2007. FINDINGS: Brain: Bilateral subarachnoid hemorrhage. Scattered bilateral hyperdense blood in the cerebral sulci which is most apparent at the bilateral vertex although there is anterior frontal convexity involvement  near the anterior cranial fossa (series 2, image 15). There is occipital lobe sulcal involvement more pronounced on the right. However, the bilateral sylvian fissures and the bilateral basilar cisterns are relatively spared. No intraventricular hemorrhage or ventriculomegaly identified. No midline shift or intracranial mass effect. No convincing subdural blood. No cortically based acute infarct identified. Mild for age patchy bilateral white matter hypodensity. Vascular: Calcified atherosclerosis at the skull base. No suspicious intracranial vascular hyperdensity. Skull: Intact.  No acute osseous abnormality identified. Sinuses/Orbits: Maxillary sinus mucoperiosteal thickening with small new fluid level since 2008. Other Visualized paranasal sinuses and mastoids are stable and well aerated. Other: Postoperative changes to the globes since 2008. No gaze deviation. Visualized scalp soft tissues are within normal limits. ASPECTS [facility] Stroke Program Early CT Score) Total score (0-10 with 10 being normal): Not applicable, acute subarachnoid hemorrhage. IMPRESSION: 1. Positive for bilateral Acute Subarachnoid Hemorrhage. Small to moderate volume relatively sparing the basilar cisterns and sylvian fissures, more pronounced over the convexities, a pattern most resembling sequelae of trauma. Coagulopathy might appear similar. 2. No intracranial mass effect, midline shift, acute cortically based infarct. No IVH or ventriculomegaly. 3. No skull fracture or scalp hematoma identified. These results were communicated to Dr. Otelia Limes at 7:20 am on 08/12/2023 by text page via the [facility] messaging system. Electronically Signed   By: Odessa Fleming M.D.   On: 08/12/2023 07:21    EKG: Independently reviewed.  NSR with rate 73; nonspecific ST changes with no evidence of acute ischemia   Labs on Admission: I have personally reviewed the available labs and imaging studies at the time of the admission.  Pertinent labs:   wbc: 15.2,   creatinine: 1.45,  t.bili: 1.9  Assessment and Plan: Principal Problem:   SAH (subarachnoid hemorrhage) (HCC) Active Problems:   Syncope and collapse   Leukocytosis   Type 2 diabetes mellitus with other specified complication (HCC)   Hypertension   Lung cancer (HCC)   Chronic kidney disease, stage 3b (HCC)   Chronic diastolic CHF (congestive heart  failure) (HCC)   Coronary artery disease involving native coronary artery of native heart without angina pectoris   Gastro-esophageal reflux disease without esophagitis   Hyperlipidemia   Hypothyroidism   Insomnia   Abdominal aortic aneurysm without rupture (HCC)    Assessment and Plan: * SAH (subarachnoid hemorrhage) (HCC) 72 year old presenting to ED after unwitnessed fall, found by wife on ground and not responsive found to have bilateral SAH -obs to progressive -initially called as a code stroke,seen by neurology but signing off -neurosurgery consulted, will follow. Repeat head CT in 24 hours -plavix dose on 12/28 in the AM. Hold. Resume per neurosurgery  -hold ASA  -blood pressure control  -frequent neuro checks -seizure precautions   Syncope and collapse Unwitnessed with no prodromal symptoms ? Orthostasis with diarrhea/poor PO intake and soft BP Orthostatics pending/TED hose  Telemetry monitoring  CT head with SAH, check brain MRI with hx of stage IV lung cancer  No hx of seizures and no signs of seizure, low threshold for EEG Check mag/CK and troponin  Echo in 12/2022 with no aortic stenosis. Moderate AI, unchanged from previous echo    Leukocytosis Likely reactive, but has had stomach upset with diarrhea  No stomach pain and no other clinical signs at this time to warrant CT abdomen Trend and follow closely F/u on UA   Type 2 diabetes mellitus with other specified complication (HCC) A1C of 6.3 in 2022, repeat pending  Hold amyaryl, jardiance  Continue tradjenta SSI accuchecks qac/hs   Hypertension Blood  pressures soft in setting of possible syncope Check orthostatics, hold for now Want bp controlled with SAH, continue metoprolol, hold cozaar   Chronic kidney disease, stage 3b (HCC) Creatinine baseline around 1.5 Stable, continue to monitor   Lung cancer (HCC) Diagnosed nearly 20 years ago Continue tarceva  Check brain MRI with syncopal hx in setting of lung cancer   Chronic diastolic CHF (congestive heart failure) (HCC) Euvolemic Last echo 12/2022 with normal EF and grade 1 DD Daily weights  Hold meds with hypotension   Coronary artery disease involving native coronary artery of native heart without angina pectoris -remote PCI of the right coronary artery left circumflex vessel in 2004 and repeat stenting of the left circumflex in 2015  -hold DAPT -check troponin in setting of unwitnessed syncope -continue medical management   Gastro-esophageal reflux disease without esophagitis Continue pepcid   Hyperlipidemia Continue lipitor 40mg  daily   Hypothyroidism Check TSH Continue synthroid daily   Insomnia Continue sleep meds   Abdominal aortic aneurysm without rupture (HCC) Followed by cardiology  Last scan 4/24: infrarenal abdominal aortic ultrasound demonstrating an aneurysm of 3.9 cm  Repeat duplex screening yearly     Advance Care Planning:   Code Status: Full Code   Consults: neurology, neurosurgery   DVT Prophylaxis: SCDs  Family Communication: wife at bedside   Severity of Illness: The appropriate patient status for this patient is OBSERVATION. Observation status is judged to be reasonable and necessary in order to provide the required intensity of service to ensure the patient's safety. The patient's presenting symptoms, physical exam findings, and initial radiographic and laboratory data in the context of their medical condition is felt to place them at decreased risk for further clinical deterioration. Furthermore, it is anticipated that the patient  will be medically stable for discharge from the hospital within 2 midnights of admission.   Author: Orland Mustard, MD 08/12/2023 6:09 PM  For on call review [email or web address].

## 2023-08-12 NOTE — Assessment & Plan Note (Signed)
Diagnosed nearly 20 years ago Continue tarceva  Check brain MRI with syncopal hx in setting of lung cancer

## 2023-08-12 NOTE — Assessment & Plan Note (Signed)
Followed by cardiology  Last scan 4/24: infrarenal abdominal aortic ultrasound demonstrating an aneurysm of 3.9 cm  Repeat duplex screening yearly

## 2023-08-12 NOTE — Assessment & Plan Note (Signed)
Continue sleep meds

## 2023-08-12 NOTE — Assessment & Plan Note (Addendum)
Blood pressures soft in setting of possible syncope Check orthostatics, hold for now Want bp controlled with SAH, continue metoprolol, hold cozaar

## 2023-08-12 NOTE — Consult Note (Addendum)
Providing Compassionate, Quality Care - Together   Reason for Consult: [facility] Referring Physician: Dr. Theodis Shove II is an 72 y.o. male.  HPI: [name] is a 72 year old male with history outlined below. He reports he hadn't been feeling well over the last three days. He went to the kitchen this morning to get a drink and had a syncopal event. He tells me his next memory is waking up in the ambulance. His wife called EMS and he was transported to [facility] ED. CT imaging in the ED revealed very small bilateral SAH. Patient is on ASA and Plavix. Neurosurgery was consulted for further evaluation and recommendation.  The patient denies fever, chills, headache, changes in vision or speech, weakness, or facial droop.  Past Medical History:  Diagnosis Date   AAA (abdominal aortic aneurysm) (HCC)    a. 3.4 x 3.5 on scan 02/28/13.   Allergic reaction to contrast dye    Atherosclerosis    CAD (coronary artery disease)    a. stenting of RCA/LCx 2004. b. [place] 08/2013:  s/p DES to LCx for severe ISR.   Diabetes mellitus    Diverticulosis    Emphysema lung (HCC)    Emphysema of lung (HCC)    Encounter for therapeutic drug monitoring 06/06/2016   GERD (gastroesophageal reflux disease)    Hemorrhoids    Hepatic steatosis    History of radiation therapy 09/07/20-09/22/20   SBRT left lung ; Dr Antony Blackbird   HTN (hypertension)    Hyperlipidemia    Kidney stone    Lung cancer [facility]) dx'd 2004   chemo/xrt comp; tarceva ongoing- January 2022 radiation    Past Surgical History:  Procedure Laterality Date   ANGIOPLASTY / STENTING FEMORAL  08/14/2013   2004-groin   CATARACT EXTRACTION     COLONOSCOPY     LEFT HEART CATHETERIZATION WITH CORONARY ANGIOGRAM N/A 09/08/2013   Procedure: LEFT HEART CATHETERIZATION WITH CORONARY ANGIOGRAM;  Surgeon: Micheline Chapman, MD;  Location: [facility] CATH LAB;  Service: Cardiovascular;  Laterality: N/A;   left partial lobectomy     WISDOM TOOTH EXTRACTION       Family History  Problem Relation Age of Onset   Heart disease Sister    Diabetes Sister        oral cancer   Ovarian cancer Sister    Colon cancer Neg Hx    Esophageal cancer Neg Hx    Rectal cancer Neg Hx    Stomach cancer Neg Hx     Social History:  reports that he quit smoking about 21 years ago. His smoking use included cigarettes. He has never used smokeless tobacco. He reports that he does not drink alcohol and does not use drugs.  Allergies:  Allergies  Allergen Reactions   Iohexol Other (See Comments)     Code: HIVES, Desc: pt recieves 50mg  benadryl 1 hour prior to scan    Ivp Dye [Iodinated Contrast Media] Rash   Morphine And Codeine Anxiety    hallucinations   Other Rash    chlorascrub when starting IV   Povidone Iodine Rash    Other reaction(s): heated rash    Medications: I have reviewed the patient's current medications.  Results for orders placed or performed during the hospital encounter of 08/12/23 (from the past 48 hours)  Protime-INR     Status: None   Collection Time: 08/12/23  7:05 AM  Result Value Ref Range   Prothrombin Time 13.3 11.4 - 15.2  seconds   INR 1.0 0.8 - 1.2    Comment: (NOTE) INR goal varies based on device and disease states. Performed at [facility], [address] [place], [place] [address]   APTT     Status: None   Collection Time: 08/12/23  7:05 AM  Result Value Ref Range   aPTT 29 24 - 36 seconds    Comment: Performed at [facility], [address], [place], [place] [address]  CBC     Status: Abnormal   Collection Time: 08/12/23  7:05 AM  Result Value Ref Range   WBC 15.2 (H) 4.0 - 10.5 K/uL   RBC 4.82 4.22 - 5.81 MIL/uL   Hemoglobin 15.9 13.0 - 17.0 g/dL   HCT 24.4 01.0 - 27.2 %   MCV 95.4 80.0 - 100.0 fL   MCH 33.0 26.0 - 34.0 pg   MCHC 34.6 30.0 - 36.0 g/dL   RDW 53.6 64.4 - 03.4 %   Platelets 227 150 - 400 K/uL   nRBC 0.0 0.0 - 0.2 %    Comment: Performed at [facility], [address], [place], [place] [address]  Differential     Status: Abnormal   Collection Time: 08/12/23  7:05 AM  Result Value Ref Range   Neutrophils Relative % 71 %   Neutro Abs 10.9 (H) 1.7 - 7.7 K/uL   Lymphocytes Relative 18 %   Lymphs Abs 2.7 0.7 - 4.0 K/uL   Monocytes Relative 8 %   Monocytes Absolute 1.2 (H) 0.1 - 1.0 K/uL   Eosinophils Relative 2 %   Eosinophils Absolute 0.3 0.0 - 0.5 K/uL   Basophils Relative 0 %   Basophils Absolute 0.1 0.0 - 0.1 K/uL   Immature Granulocytes 1 %   Abs Immature Granulocytes 0.08 (H) 0.00 - 0.07 K/uL    Comment: Performed at [facility], [address] [place], [place] [address]  Comprehensive metabolic panel     Status: Abnormal   Collection Time: 08/12/23  7:05 AM  Result Value Ref Range   Sodium 137 135 - 145 mmol/L   Potassium 5.1 3.5 - 5.1 mmol/L   Chloride 106 98 - 111 mmol/L   CO2 20 (L) 22 - 32 mmol/L   Glucose, Bld 169 (H) 70 - 99 mg/dL    Comment: Glucose reference range applies only to samples taken after fasting for at least 8 hours.   BUN 13 8 - 23 mg/dL   Creatinine, Ser 5.64 (H) 0.61 - 1.24 mg/dL   Calcium 8.1 (L) 8.9 - 10.3 mg/dL   Total Protein 6.2 (L) 6.5 - 8.1 g/dL   Albumin 3.5 3.5 - 5.0 g/dL   AST 41 15 - 41 U/L   ALT 30 0 - 44 U/L   Alkaline Phosphatase 76 38 - 126 U/L   Total Bilirubin 1.9 (H) <1.2 mg/dL   GFR, Estimated 51 (L) >60 mL/min    Comment: (NOTE) Calculated using the CKD-EPI Creatinine Equation (2021)    Anion gap 11 5 - 15    Comment: Performed at [facility], [address], [place], [place] [address]  Ethanol     Status: None   Collection Time: 08/12/23  7:05 AM  Result Value Ref Range   Alcohol, Ethyl (B) <10 <10 mg/dL    Comment: (NOTE) Lowest detectable limit for serum alcohol is 10 mg/dL.  For medical purposes only. Performed at [facility], [address], [place], [place] [address]   CBG monitoring, ED     Status: Abnormal   Collection Time: 08/12/23  7:06 AM  Result  Value Ref Range   Glucose-Capillary 177 (H) 70 - 99 mg/dL    Comment: Glucose reference range applies only to samples taken after fasting for at least 8 hours.  I-stat chem 8, ED     Status: Abnormal   Collection Time: 08/12/23  7:16 AM  Result Value Ref Range   Sodium 139 135 - 145 mmol/L   Potassium 5.6 (H) 3.5 - 5.1 mmol/L   Chloride 107 98 - 111 mmol/L   BUN 17 8 - 23 mg/dL   Creatinine, Ser 6.30 (H) 0.61 - 1.24 mg/dL   Glucose, Bld 160 (H) 70 - 99 mg/dL    Comment: Glucose reference range applies only to samples taken after fasting for at least 8 hours.   Calcium, Ion 1.00 (L) 1.15 - 1.40 mmol/L   TCO2 23 22 - 32 mmol/L   Hemoglobin 15.0 13.0 - 17.0 g/dL   HCT 10.9 32.3 - 55.7 %    CT HEAD CODE STROKE WO CONTRAST Result Date: 08/12/2023 CLINICAL DATA:  Code stroke.  72 year old male neurologic deficit. EXAM: CT HEAD WITHOUT CONTRAST TECHNIQUE: Contiguous axial images were obtained from the base of the skull through the vertex without intravenous contrast. RADIATION DOSE REDUCTION: This exam was performed according to the departmental dose-optimization program which includes automated exposure control, adjustment of the mA and/or kV according to patient size and/or use of iterative reconstruction technique. COMPARISON:  Head CT 02/11/2007. FINDINGS: Brain: Bilateral subarachnoid hemorrhage. Scattered bilateral hyperdense blood in the cerebral sulci which is most apparent at the bilateral vertex although there is anterior frontal convexity involvement near the anterior cranial fossa (series 2, image 15). There is occipital lobe sulcal involvement more pronounced on the right. However, the bilateral sylvian fissures and the bilateral basilar cisterns are relatively spared. No intraventricular hemorrhage or ventriculomegaly identified. No midline shift or intracranial mass effect. No convincing subdural blood. No cortically based acute infarct identified. Mild for age patchy bilateral white  matter hypodensity. Vascular: Calcified atherosclerosis at the skull base. No suspicious intracranial vascular hyperdensity. Skull: Intact.  No acute osseous abnormality identified. Sinuses/Orbits: Maxillary sinus mucoperiosteal thickening with small new fluid level since 2008. Other Visualized paranasal sinuses and mastoids are stable and well aerated. Other: Postoperative changes to the globes since 2008. No gaze deviation. Visualized scalp soft tissues are within normal limits. ASPECTS [facility] Stroke Program Early CT Score) Total score (0-10 with 10 being normal): Not applicable, acute subarachnoid hemorrhage. IMPRESSION: 1. Positive for bilateral Acute Subarachnoid Hemorrhage. Small to moderate volume relatively sparing the basilar cisterns and sylvian fissures, more pronounced over the convexities, a pattern most resembling sequelae of trauma. Coagulopathy might appear similar. 2. No intracranial mass effect, midline shift, acute cortically based infarct. No IVH or ventriculomegaly. 3. No skull fracture or scalp hematoma identified. These results were communicated to Dr. Otelia Limes at 7:20 am on 08/12/2023 by text page via the [facility] messaging system. Electronically Signed   By: Odessa Fleming M.D.   On: 08/12/2023 07:21    Review of Systems  Constitutional: Negative.   HENT: Negative.    Eyes: Negative.   Respiratory: Negative.    Cardiovascular: Negative.   Gastrointestinal:  Positive for diarrhea. Negative for nausea and vomiting.  Genitourinary: Negative.   Musculoskeletal: Negative.   Skin: Negative.   Neurological: Negative.   Endo/Heme/Allergies: Negative.   Psychiatric/Behavioral:  Negative.     Blood pressure 117/67, pulse 66, temperature (!) 97.5 F (36.4 C), temperature source Oral, resp. rate 15, height 6\' 1"  (1.854 m), weight 88.2 kg, SpO2 94%. Estimated body mass index is 25.65 kg/m as calculated from the following:   Height as of this encounter: 6\' 1"  (1.854 m).   Weight as of this  encounter: 88.2 kg.  Physical Exam Constitutional:      Appearance: Normal appearance.  HENT:     Head: Normocephalic and atraumatic.     Nose: Nose normal.     Mouth/Throat:     Mouth: Mucous membranes are moist.     Pharynx: Oropharynx is clear.  Eyes:     Extraocular Movements: Extraocular movements intact.     Conjunctiva/sclera: Conjunctivae normal.     Pupils: Pupils are equal, round, and reactive to light.  Cardiovascular:     Rate and Rhythm: Normal rate and regular rhythm.     Pulses: Normal pulses.  Pulmonary:     Effort: Pulmonary effort is normal. No respiratory distress.  Abdominal:     Palpations: Abdomen is soft.  Musculoskeletal:        General: Normal range of motion.     Cervical back: Normal range of motion.  Skin:    General: Skin is warm and dry.  Neurological:     General: No focal deficit present.     Mental Status: He is alert and oriented to person, place, and time.     GCS: GCS eye subscore is 4. GCS verbal subscore is 5. GCS motor subscore is 6.     Cranial Nerves: Cranial nerves 2-12 are intact.     Sensory: Sensation is intact.     Motor: Motor function is intact.     Coordination: Coordination is intact.  Psychiatric:        Mood and Affect: Mood normal.        Behavior: Behavior normal.        Thought Content: Thought content normal.        Judgment: Judgment normal.     Assessment/Plan: Patient with small bilateral SAH following a syncopal event at home. Hold ASA and Plavix for now. Follow up CT in the AM. Neurosurgery will continue to follow.   Val Eagle, DNP, AGNP-C Nurse Practitioner  [facility] [address], [place], [place] [address] P: [phone]    F: [phone]  08/12/2023, 12:06 PM

## 2023-08-12 NOTE — ED Provider Notes (Signed)
Emergency Department Provider Note   I have reviewed the triage vital signs and the nursing notes.   HISTORY  Chief Complaint Code Stroke   HPI [name] is a 72 y.o. male with prior history reviewed below including diabetes, hypertension, hyperlipidemia, CAD presents to the emergency department after a fall from bed with unresponsive episode and resulting confusion/speech disturbance.  Last seen normal at 1 AM.  According to EMS, the patient's wife heard him fall from bed and looked to find him with eyes open on the floor.  He was unresponsive to voice and EMS was called.  Upon their arrival he appeared to be having some word finding difficulty and overall confusion.  No report of witnessed seizure activity. Patient denies recent CP or palpitations.    Past Medical History:  Diagnosis Date   AAA (abdominal aortic aneurysm) (HCC)    a. 3.4 x 3.5 on scan 02/28/13.   Allergic reaction to contrast dye    Atherosclerosis    CAD (coronary artery disease)    a. stenting of RCA/LCx 2004. b. [place] 08/2013:  s/p DES to LCx for severe ISR.   Diabetes mellitus    Diverticulosis    Emphysema lung (HCC)    Emphysema of lung (HCC)    Encounter for therapeutic drug monitoring 06/06/2016   GERD (gastroesophageal reflux disease)    Hemorrhoids    Hepatic steatosis    History of radiation therapy 09/07/20-09/22/20   SBRT left lung ; Dr Antony Blackbird   HTN (hypertension)    Hyperlipidemia    Kidney stone    Lung cancer [facility]) dx'd 2004   chemo/xrt comp; tarceva ongoing- January 2022 radiation    Review of Systems  Constitutional: No fever/chills Cardiovascular: Denies chest pain. Respiratory: Denies shortness of breath. Gastrointestinal: No abdominal pain.  No nausea, no vomiting.  No diarrhea.  No constipation. Genitourinary: Negative for dysuria. Musculoskeletal: Negative for back pain. Skin: Negative for rash. Neurological: Negative for headaches, focal weakness or  numbness.   ____________________________________________   PHYSICAL EXAM:  VITAL SIGNS: Vitals:   08/12/23 0800 08/12/23 0815  BP: 124/65 (!) 117/55  Pulse: 71 72  Resp: 11 11  Temp:    SpO2: 96% 99%    Constitutional: Alert and oriented. Well appearing and in no acute distress. Eyes: Conjunctivae are normal. PERRL. EOMI. Head: Atraumatic. Nose: No congestion/rhinnorhea. Mouth/Throat: Mucous membranes are moist.  Neck: No stridor.   Cardiovascular: Normal rate, regular rhythm. Good peripheral circulation. Grossly normal heart sounds.   Respiratory: Normal respiratory effort.  No retractions. Lungs CTAB. Gastrointestinal: Soft and nontender. No distention.  Musculoskeletal: No lower extremity tenderness nor edema. No gross deformities of extremities. Neurologic:  Speech is clear. No gross focal neurologic deficits are appreciated.  Skin:  Skin is warm, dry and intact. No rash noted.  ____________________________________________   LABS (all labs ordered are listed, but only abnormal results are displayed)  Labs Reviewed  CBC - Abnormal; Notable for the following components:      Result Value   WBC 15.2 (*)    All other components within normal limits  DIFFERENTIAL - Abnormal; Notable for the following components:   Neutro Abs 10.9 (*)    Monocytes Absolute 1.2 (*)    Abs Immature Granulocytes 0.08 (*)    All other components within normal limits  COMPREHENSIVE METABOLIC PANEL - Abnormal; Notable for the following components:   CO2 20 (*)    Glucose, Bld 169 (*)    Creatinine,  Ser 1.45 (*)    Calcium 8.1 (*)    Total Protein 6.2 (*)    Total Bilirubin 1.9 (*)    GFR, Estimated 51 (*)    All other components within normal limits  I-STAT CHEM 8, ED - Abnormal; Notable for the following components:   Potassium 5.6 (*)    Creatinine, Ser 1.50 (*)    Glucose, Bld 169 (*)    Calcium, Ion 1.00 (*)    All other components within normal limits  CBG MONITORING, ED -  Abnormal; Notable for the following components:   Glucose-Capillary 177 (*)    All other components within normal limits  PROTIME-INR  APTT  ETHANOL   ____________________________________________  EKG   EKG Interpretation Date/Time:  Sunday August 12 2023 07:31:56 EST Ventricular Rate:  73 PR Interval:  185 QRS Duration:  88 QT Interval:  391 QTC Calculation: 431 R Axis:   20  Text Interpretation: Sinus rhythm Abnormal R-wave progression, early transition Baseline wander in lead(s) V3 Confirmed by Alona Bene [phone]) on 08/12/2023 8:28:17 AM        ____________________________________________  RADIOLOGY  CT HEAD CODE STROKE WO CONTRAST Result Date: 08/12/2023 CLINICAL DATA:  Code stroke.  72 year old male neurologic deficit. EXAM: CT HEAD WITHOUT CONTRAST TECHNIQUE: Contiguous axial images were obtained from the base of the skull through the vertex without intravenous contrast. RADIATION DOSE REDUCTION: This exam was performed according to the departmental dose-optimization program which includes automated exposure control, adjustment of the mA and/or kV according to patient size and/or use of iterative reconstruction technique. COMPARISON:  Head CT 02/11/2007. FINDINGS: Brain: Bilateral subarachnoid hemorrhage. Scattered bilateral hyperdense blood in the cerebral sulci which is most apparent at the bilateral vertex although there is anterior frontal convexity involvement near the anterior cranial fossa (series 2, image 15). There is occipital lobe sulcal involvement more pronounced on the right. However, the bilateral sylvian fissures and the bilateral basilar cisterns are relatively spared. No intraventricular hemorrhage or ventriculomegaly identified. No midline shift or intracranial mass effect. No convincing subdural blood. No cortically based acute infarct identified. Mild for age patchy bilateral white matter hypodensity. Vascular: Calcified atherosclerosis at the skull  base. No suspicious intracranial vascular hyperdensity. Skull: Intact.  No acute osseous abnormality identified. Sinuses/Orbits: Maxillary sinus mucoperiosteal thickening with small new fluid level since 2008. Other Visualized paranasal sinuses and mastoids are stable and well aerated. Other: Postoperative changes to the globes since 2008. No gaze deviation. Visualized scalp soft tissues are within normal limits. ASPECTS [facility] Stroke Program Early CT Score) Total score (0-10 with 10 being normal): Not applicable, acute subarachnoid hemorrhage. IMPRESSION: 1. Positive for bilateral Acute Subarachnoid Hemorrhage. Small to moderate volume relatively sparing the basilar cisterns and sylvian fissures, more pronounced over the convexities, a pattern most resembling sequelae of trauma. Coagulopathy might appear similar. 2. No intracranial mass effect, midline shift, acute cortically based infarct. No IVH or ventriculomegaly. 3. No skull fracture or scalp hematoma identified. These results were communicated to Dr. Otelia Limes at 7:20 am on 08/12/2023 by text page via the [facility] messaging system. Electronically Signed   By: Odessa Fleming M.D.   On: 08/12/2023 07:21    ____________________________________________   PROCEDURES  Procedure(s) performed:   Procedures  CRITICAL CARE Performed by: Maia Plan Total critical care time: 35 minutes Critical care time was exclusive of separately billable procedures and treating other patients. Critical care was necessary to treat or prevent imminent or life-threatening deterioration. Critical care was time spent personally  by me on the following activities: development of treatment plan with patient and/or surrogate as well as nursing, discussions with consultants, evaluation of patient's response to treatment, examination of patient, obtaining history from patient or surrogate, ordering and performing treatments and interventions, ordering and review of laboratory studies,  ordering and review of radiographic studies, pulse oximetry and re-evaluation of patient's condition.  Alona Bene, MD Emergency Medicine  ____________________________________________   INITIAL IMPRESSION / ASSESSMENT AND PLAN / ED COURSE  Pertinent labs & imaging results that were available during my care of the patient were reviewed by me and considered in my medical decision making (see chart for details).   This patient is Presenting for Evaluation of head injury/AMS, which does require a range of treatment options, and is a complaint that involves a high risk of morbidity and mortality.  The Differential Diagnoses includes but is not exclusive to alcohol, illicit or prescription medications, intracranial pathology such as stroke, intracerebral hemorrhage, fever or infectious causes including sepsis, hypoxemia, uremia, trauma, endocrine related disorders such as diabetes, hypoglycemia, thyroid-related diseases, etc.   Critical Interventions-    Medications  sodium chloride flush (NS) 0.9 % injection 3 mL (3 mLs Intravenous Given 08/12/23 0759)  acetaminophen (TYLENOL) tablet 1,000 mg (1,000 mg Oral Given 08/12/23 0823)    Reassessment after intervention: patient looking well.    I did obtain Additional Historical Information from EMS and wife at bedside.    Clinical Laboratory Tests Ordered, included i-STAT chemistry shows creatinine of 1.5.  Mild leukocytosis to 15.2 with no severe anemia.  Radiologic Tests Ordered, included CT head. I independently interpreted the images and agree with radiology interpretation.   Cardiac Monitor Tracing which shows NSR.    Social Determinants of Health Risk patient is a non-smoker.   Consult complete with Neurology, Dr. Otelia Limes. Traumatic SAH. Defer to NSG regarding mgmt.   NSG, Dr. Lovell Sheehan. They will consult. Plan for repeat CT head in the AM to assess stability.   TRH, Dr. Artis Flock. Plan for admit.   Medical Decision Making: Summary:   Patient presents emergency department activated as a code stroke prior to arrival with speech disturbance.  Awake, alert here.  Oriented.  No gross motor or sensory deficit on arrival.  Patient cleared for CT and taken with neurology team.   Reevaluation with update and discussion with patient and now wife bedside.  Patient recalls getting up and walking to the kitchen to get something to drink.  He does not remember anything past that point until he was in the back of the ambulance.  His wife heard a loud noise and came to the bedside at 5:30 AM.   Patient's presentation is most consistent with acute presentation with potential threat to life or bodily function.   Disposition: admit  ____________________________________________  FINAL CLINICAL IMPRESSION(S) / ED DIAGNOSES  Final diagnoses:  Syncope, unspecified syncope type  Injury of head, initial encounter  SAH (subarachnoid hemorrhage) (HCC)    Note:  This document was prepared using Dragon voice recognition software and may include unintentional dictation errors.  Alona Bene, MD, [facility] Emergency Medicine    Trude Cansler, Arlyss Repress, MD 08/12/23 [phone]

## 2023-08-13 ENCOUNTER — Encounter (HOSPITAL_COMMUNITY): Payer: Self-pay | Admitting: Family Medicine

## 2023-08-13 ENCOUNTER — Other Ambulatory Visit: Payer: Self-pay

## 2023-08-13 ENCOUNTER — Observation Stay (HOSPITAL_COMMUNITY): Payer: Medicare Other

## 2023-08-13 DIAGNOSIS — S066XAA Traumatic subarachnoid hemorrhage with loss of consciousness status unknown, initial encounter: Secondary | ICD-10-CM | POA: Diagnosis present

## 2023-08-13 DIAGNOSIS — I609 Nontraumatic subarachnoid hemorrhage, unspecified: Secondary | ICD-10-CM

## 2023-08-13 DIAGNOSIS — R410 Disorientation, unspecified: Secondary | ICD-10-CM | POA: Diagnosis present

## 2023-08-13 DIAGNOSIS — G4700 Insomnia, unspecified: Secondary | ICD-10-CM | POA: Diagnosis present

## 2023-08-13 DIAGNOSIS — I5032 Chronic diastolic (congestive) heart failure: Secondary | ICD-10-CM | POA: Diagnosis present

## 2023-08-13 DIAGNOSIS — K219 Gastro-esophageal reflux disease without esophagitis: Secondary | ICD-10-CM | POA: Diagnosis present

## 2023-08-13 DIAGNOSIS — R4701 Aphasia: Secondary | ICD-10-CM | POA: Diagnosis present

## 2023-08-13 DIAGNOSIS — Y92013 Bedroom of single-family (private) house as the place of occurrence of the external cause: Secondary | ICD-10-CM | POA: Diagnosis not present

## 2023-08-13 DIAGNOSIS — I951 Orthostatic hypotension: Secondary | ICD-10-CM | POA: Diagnosis present

## 2023-08-13 DIAGNOSIS — D72829 Elevated white blood cell count, unspecified: Secondary | ICD-10-CM | POA: Diagnosis present

## 2023-08-13 DIAGNOSIS — I714 Abdominal aortic aneurysm, without rupture, unspecified: Secondary | ICD-10-CM | POA: Diagnosis present

## 2023-08-13 DIAGNOSIS — K760 Fatty (change of) liver, not elsewhere classified: Secondary | ICD-10-CM | POA: Diagnosis present

## 2023-08-13 DIAGNOSIS — N1832 Chronic kidney disease, stage 3b: Secondary | ICD-10-CM | POA: Diagnosis present

## 2023-08-13 DIAGNOSIS — E1122 Type 2 diabetes mellitus with diabetic chronic kidney disease: Secondary | ICD-10-CM | POA: Diagnosis present

## 2023-08-13 DIAGNOSIS — E039 Hypothyroidism, unspecified: Secondary | ICD-10-CM | POA: Diagnosis present

## 2023-08-13 DIAGNOSIS — T45525A Adverse effect of antithrombotic drugs, initial encounter: Secondary | ICD-10-CM | POA: Diagnosis present

## 2023-08-13 DIAGNOSIS — E1169 Type 2 diabetes mellitus with other specified complication: Secondary | ICD-10-CM | POA: Diagnosis present

## 2023-08-13 DIAGNOSIS — R402143 Coma scale, eyes open, spontaneous, at hospital admission: Secondary | ICD-10-CM | POA: Diagnosis present

## 2023-08-13 DIAGNOSIS — Z955 Presence of coronary angioplasty implant and graft: Secondary | ICD-10-CM | POA: Diagnosis not present

## 2023-08-13 DIAGNOSIS — D6832 Hemorrhagic disorder due to extrinsic circulating anticoagulants: Secondary | ICD-10-CM | POA: Diagnosis present

## 2023-08-13 DIAGNOSIS — E785 Hyperlipidemia, unspecified: Secondary | ICD-10-CM | POA: Diagnosis present

## 2023-08-13 DIAGNOSIS — I2510 Atherosclerotic heart disease of native coronary artery without angina pectoris: Secondary | ICD-10-CM | POA: Diagnosis present

## 2023-08-13 DIAGNOSIS — R197 Diarrhea, unspecified: Secondary | ICD-10-CM | POA: Diagnosis present

## 2023-08-13 DIAGNOSIS — W06XXXA Fall from bed, initial encounter: Secondary | ICD-10-CM | POA: Diagnosis present

## 2023-08-13 DIAGNOSIS — J439 Emphysema, unspecified: Secondary | ICD-10-CM | POA: Diagnosis present

## 2023-08-13 DIAGNOSIS — R471 Dysarthria and anarthria: Secondary | ICD-10-CM | POA: Diagnosis present

## 2023-08-13 DIAGNOSIS — I130 Hypertensive heart and chronic kidney disease with heart failure and stage 1 through stage 4 chronic kidney disease, or unspecified chronic kidney disease: Secondary | ICD-10-CM | POA: Diagnosis present

## 2023-08-13 LAB — CBC
HCT: 43.5 % (ref 39.0–52.0)
Hemoglobin: 15.5 g/dL (ref 13.0–17.0)
MCH: 33.3 pg (ref 26.0–34.0)
MCHC: 35.6 g/dL (ref 30.0–36.0)
MCV: 93.3 fL (ref 80.0–100.0)
Platelets: 180 10*3/uL (ref 150–400)
RBC: 4.66 MIL/uL (ref 4.22–5.81)
RDW: 14 % (ref 11.5–15.5)
WBC: 11.3 10*3/uL — ABNORMAL HIGH (ref 4.0–10.5)
nRBC: 0 % (ref 0.0–0.2)

## 2023-08-13 LAB — BASIC METABOLIC PANEL
Anion gap: 9 (ref 5–15)
BUN: 14 mg/dL (ref 8–23)
CO2: 21 mmol/L — ABNORMAL LOW (ref 22–32)
Calcium: 8.1 mg/dL — ABNORMAL LOW (ref 8.9–10.3)
Chloride: 110 mmol/L (ref 98–111)
Creatinine, Ser: 1.57 mg/dL — ABNORMAL HIGH (ref 0.61–1.24)
GFR, Estimated: 46 mL/min — ABNORMAL LOW (ref >60)
Glucose, Bld: 107 mg/dL — ABNORMAL HIGH (ref 70–99)
Potassium: 4.2 mmol/L (ref 3.5–5.1)
Sodium: 140 mmol/L (ref 135–145)

## 2023-08-13 LAB — D-DIMER, QUANTITATIVE: D-Dimer, Quant: 1.04 ug{FEU}/mL — ABNORMAL HIGH (ref 0.00–0.50)

## 2023-08-13 LAB — GLUCOSE, CAPILLARY
Glucose-Capillary: 124 mg/dL — ABNORMAL HIGH (ref 70–99)
Glucose-Capillary: 132 mg/dL — ABNORMAL HIGH (ref 70–99)
Glucose-Capillary: 140 mg/dL — ABNORMAL HIGH (ref 70–99)

## 2023-08-13 LAB — CBG MONITORING, ED: Glucose-Capillary: 132 mg/dL — ABNORMAL HIGH (ref 70–99)

## 2023-08-13 LAB — HEMOGLOBIN A1C
Hgb A1c MFr Bld: 7.3 % — ABNORMAL HIGH (ref 4.8–5.6)
Mean Plasma Glucose: 163 mg/dL

## 2023-08-13 LAB — MAGNESIUM: Magnesium: 1.9 mg/dL (ref 1.7–2.4)

## 2023-08-13 MED ORDER — SODIUM CHLORIDE 0.9 % IV BOLUS
500.0000 mL | Freq: Once | INTRAVENOUS | Status: AC
Start: 2023-08-13 — End: 2023-08-13
  Administered 2023-08-13: 500 mL via INTRAVENOUS

## 2023-08-13 MED ORDER — SODIUM CHLORIDE 0.9 % IV SOLN: INTRAVENOUS | Status: DC

## 2023-08-13 MED ORDER — NICOTINE POLACRILEX 2 MG MT GUM
2.0000 mg | CHEWING_GUM | OROMUCOSAL | Status: DC | PRN
  Filled 2023-08-13: qty 1

## 2023-08-13 MED ORDER — ACETAMINOPHEN 325 MG PO TABS
ORAL_TABLET | ORAL | Status: AC
  Filled 2023-08-13: qty 2

## 2023-08-13 NOTE — Evaluation (Signed)
Physical Therapy Evaluation Patient Details Name: [name] MRN: [id number] DOB: March 17, 1951 Today's Date: 08/13/2023  History of Present Illness  72 y.o. male presents to [facility] hospital on 08/12/2023 after a fall at home. CT head positive for bilateral acute SAH. PMH includes HTN, HLD, COPD, GERD, CAD, diastolic CHF, DMII, adenocarcinoma of lung.  Clinical Impression  Pt presents to PT with reports of chronic deficits in balance when making quikc changes in direction. Pt reports instability when turning corners at baseline, stating that he often holds onto the wall to improve stability. Pt denies dizziness with bed mobility and horizontal VOR is intact and asymptomatic today. Pt is able to ambulate independently during this session. PT does note that the pt is orthostatic, although asymptomatic. PT provides education on methods to reduce the risk of orthostatic hypotension and syncope, including adequate hydration, increased time between positional changes, and LE exercise prior to standing/ambulating. PT recommends discharge home when medically appropriate.        If plan is discharge home, recommend the following:     Can travel by private vehicle        Equipment Recommendations None recommended by PT  Recommendations for Other Services       Functional Status Assessment Patient has not had a recent decline in their functional status     Precautions / Restrictions Precautions Precautions: Fall Precaution Comments: orthostatic hypotension Restrictions Weight Bearing Restrictions Per Provider Order: No      Mobility  Bed Mobility Overal bed mobility: Independent                  Transfers Overall transfer level: Independent                      Ambulation/Gait Ambulation/Gait assistance: Modified independent (Device/Increase time) Gait Distance (Feet): 250 Feet Assistive device: None Gait Pattern/deviations: Step-through pattern Gait velocity:  functional Gait velocity interpretation: 1.31 - 2.62 ft/sec, indicative of limited community ambulator   General Gait Details: pt with steady step-through gait, pt reports mild instability when changing directions at times although no significant balance deviations observed.  Stairs            Wheelchair Mobility     Tilt Bed    Modified Rankin (Stroke Patients Only)       Balance Overall balance assessment: No apparent balance deficits (not formally assessed), Needs assistance Sitting-balance support: No upper extremity supported, Feet supported Sitting balance-Leahy Scale: Good     Standing balance support: No upper extremity supported, During functional activity Standing balance-Leahy Scale: Good           Rhomberg - Eyes Opened: 30 Rhomberg - Eyes Closed: 30                 Pertinent Vitals/Pain Pain Assessment Pain Assessment: 0-10 Pain Score: 2  Pain Location: head Pain Descriptors / Indicators: Headache Pain Intervention(s): Monitored during session    Home Living Family/patient expects to be discharged to:: Private residence Living Arrangements: Spouse/significant other Available Help at Discharge: Family;Available PRN/intermittently Type of Home: House Home Access: Stairs to enter Entrance Stairs-Rails: None Entrance Stairs-Number of Steps: 2   Home Layout: One level Home Equipment: Agricultural consultant (2 wheels);Cane - single point;Shower seat;BSC/3in1      Prior Function Prior Level of Function : Independent/Modified Independent;Driving                     Extremity/Trunk Assessment   Upper Extremity Assessment  Upper Extremity Assessment: Overall WFL for tasks assessed    Lower Extremity Assessment Lower Extremity Assessment: Overall WFL for tasks assessed    Cervical / Trunk Assessment Cervical / Trunk Assessment: Normal  Communication   Communication Communication: No apparent difficulties Cueing Techniques: Verbal  cues  Cognition Arousal: Alert Behavior During Therapy: WFL for tasks assessed/performed Overall Cognitive Status: Within Functional Limits for tasks assessed                                          General Comments General comments (skin integrity, edema, etc.): pt with orthostatic hypotension, asymptomatic during session    Exercises     Assessment/Plan    PT Assessment Patient needs continued PT services  PT Problem List Decreased balance;Cardiopulmonary status limiting activity       PT Treatment Interventions DME instruction;Gait training;Stair training;Functional mobility training;Balance training;Neuromuscular re-education;Patient/family education    PT Goals (Current goals can be found in the Care Plan section)  Acute Rehab PT Goals Patient Stated Goal: to improve stability with quick changes in direction PT Goal Formulation: With patient Time For Goal Achievement: 08/27/23 Potential to Achieve Goals: Good Additional Goals Additional Goal #1: Pt will score >19/24 on the DGI to indicate a reduced risk for falls Additional Goal #2: Pt will score >45/56 on the BERG to indicate a reduced risk for falls    Frequency Min 1X/week     Co-evaluation               AM-PAC PT "6 Clicks" Mobility  Outcome Measure Help needed turning from your back to your side while in a flat bed without using bedrails?: None Help needed moving from lying on your back to sitting on the side of a flat bed without using bedrails?: None Help needed moving to and from a bed to a chair (including a wheelchair)?: None Help needed standing up from a chair using your arms (e.g., wheelchair or bedside chair)?: None Help needed to walk in hospital room?: None Help needed climbing 3-5 steps with a railing? : A Little 6 Click Score: 23    End of Session   Activity Tolerance: Patient tolerated treatment well Patient left: in bed;with call bell/phone within reach Nurse  Communication: Mobility status PT Visit Diagnosis: Other abnormalities of gait and mobility (R26.89)    Time: 7829-5621 PT Time Calculation (min) (ACUTE ONLY): 29 min   Charges:   PT Evaluation $PT Eval Low Complexity: 1 Low   PT General Charges $$ ACUTE PT VISIT: 1 Visit         Arlyss Gandy, PT, DPT Acute Rehabilitation Office [phone]   Arlyss Gandy 08/13/2023, 9:21 AM

## 2023-08-13 NOTE — Progress Notes (Signed)
Providing Compassionate, Quality Care - Together   Subjective: Patient reports no new issues. He is working with physical therapy, who reports the patient experienced orthostatic hypotension when standing.  Objective: Vital signs in last 24 hours: Temp:  [97.9 F (36.6 C)-98.4 F (36.9 C)] 98.4 F (36.9 C) (12/30 0600) Pulse Rate:  [65-111] 111 (12/30 0845) Resp:  [9-21] 14 (12/30 0845) BP: (111-159)/(57-92) 129/73 (12/30 0845) SpO2:  [92 %-100 %] 96 % (12/30 0845)  Intake/Output from previous day: No intake/output data recorded. Intake/Output this shift: No intake/output data recorded.  Alert and oriented x 4 CN II-XII intact PERRLA CN II-XII grossly intact MAE, Strength and sensation intact   Lab Results: Recent Labs    08/12/23 0705 08/12/23 0716 08/13/23 0454  WBC 15.2*  --  11.3*  HGB 15.9 15.0 15.5  HCT 46.0 44.0 43.5  PLT 227  --  180   BMET Recent Labs    08/12/23 0705 08/12/23 0716 08/13/23 0454  NA 137 139 140  K 5.1 5.6* 4.2  CL 106 107 110  CO2 20*  --  21*  GLUCOSE 169* 169* 107*  BUN 13 17 14   CREATININE 1.45* 1.50* 1.57*  CALCIUM 8.1*  --  8.1*    Studies/Results: CT HEAD WO CONTRAST ( ) Result Date: 08/13/2023 CLINICAL DATA:  Provided history: Subarachnoid hemorrhage. EXAM: CT HEAD WITHOUT CONTRAST TECHNIQUE: Contiguous axial images were obtained from the base of the skull through the vertex without intravenous contrast. RADIATION DOSE REDUCTION: This exam was performed according to the departmental dose-optimization program which includes automated exposure control, adjustment of the mA and/or kV according to patient size and/or use of iterative reconstruction technique. COMPARISON:  Brain MRI 08/12/2023.  Head CT 08/12/2023. FINDINGS: Brain: Mild generalized cerebral volume loss. Small-to-moderate volume acute subarachnoid hemorrhage again noted along the bilateral cerebral hemispheres (greatest along the frontoparietal convexities),  not significantly changed from the prior head CT and brain MRI examinations of 08/12/2023. No demarcated cortical infarct. No evidence of an intracranial mass. No midline shift or hydrocephalus. Vascular: No hyperdense vessel.  Atherosclerotic calcifications. Skull: No calvarial fracture or aggressive osseous lesion. Sinuses/Orbits: No mass or acute finding within the imaged orbits. No significant paranasal sinus disease at the imaged levels. IMPRESSION: 1. Small-to-moderate volume acute subarachnoid hemorrhage again noted along the bilateral cerebral hemispheres (greatest along the frontoparietal convexities), not significantly changed from the prior head CT and brain MRI examinations of 08/12/2023. 2. No evidence of an interval acute intracranial abnormality. Electronically Signed   By: Jackey Loge D.O.   On: 08/13/2023 07:27   MR BRAIN WO CONTRAST Result Date: 08/12/2023 CLINICAL DATA:  72 year old male with syncope, code stroke presentation this morning with subarachnoid hemorrhage. EXAM: MRI HEAD WITHOUT CONTRAST TECHNIQUE: Multiplanar, multiecho pulse sequences of the brain and surrounding structures were obtained without intravenous contrast. COMPARISON:  Head CT 0712 hours today. FINDINGS: Brain: Scattered subarachnoid susceptibility on DWI, most pronounced at the vertex, compatible with known blood products. No restricted diffusion or evidence of acute infarction. No midline shift, mass effect, or evidence of intracranial mass lesion. No ventriculomegaly. No intraventricular hemorrhage. Scattered and layering subarachnoid blood as seen by head CT, most apparent at the bilateral vertex on FLAIR and SWI imaging. No subdural or extradural hemorrhage or collection. No convincing underlying chronic microhemorrhage or superficial siderosis. And otherwise largely normal for age gray and white matter signal throughout the brain. No definite cortical encephalomalacia. Vascular: Major intracranial vascular flow  voids are preserved. Skull and upper  cervical spine: Negative visible cervical spine. Visualized bone marrow signal is within normal limits. Sinuses/Orbits: Postoperative changes to both globes. Fluid, mucosal thickening again noted in the maxillary sinuses. Other: Mastoids are well aerated. Grossly normal visible internal auditory structures. Negative visible scalp and face. IMPRESSION: 1. Stable small volume bilateral Subarachnoid Hemorrhage, pattern unchanged from earlier Head CT. No IVH, ventriculomegaly, or other complicating features. 2. No evidence of acute infarct, and otherwise largely normal for age noncontrast MRI appearance of the Brain. Electronically Signed   By: Odessa Fleming M.D.   On: 08/12/2023 11:09   CT HEAD CODE STROKE WO CONTRAST Result Date: 08/12/2023 CLINICAL DATA:  Code stroke.  72 year old male neurologic deficit. EXAM: CT HEAD WITHOUT CONTRAST TECHNIQUE: Contiguous axial images were obtained from the base of the skull through the vertex without intravenous contrast. RADIATION DOSE REDUCTION: This exam was performed according to the departmental dose-optimization program which includes automated exposure control, adjustment of the mA and/or kV according to patient size and/or use of iterative reconstruction technique. COMPARISON:  Head CT 02/11/2007. FINDINGS: Brain: Bilateral subarachnoid hemorrhage. Scattered bilateral hyperdense blood in the cerebral sulci which is most apparent at the bilateral vertex although there is anterior frontal convexity involvement near the anterior cranial fossa (series 2, image 15). There is occipital lobe sulcal involvement more pronounced on the right. However, the bilateral sylvian fissures and the bilateral basilar cisterns are relatively spared. No intraventricular hemorrhage or ventriculomegaly identified. No midline shift or intracranial mass effect. No convincing subdural blood. No cortically based acute infarct identified. Mild for age patchy  bilateral white matter hypodensity. Vascular: Calcified atherosclerosis at the skull base. No suspicious intracranial vascular hyperdensity. Skull: Intact.  No acute osseous abnormality identified. Sinuses/Orbits: Maxillary sinus mucoperiosteal thickening with small new fluid level since 2008. Other Visualized paranasal sinuses and mastoids are stable and well aerated. Other: Postoperative changes to the globes since 2008. No gaze deviation. Visualized scalp soft tissues are within normal limits. ASPECTS [facility] Stroke Program Early CT Score) Total score (0-10 with 10 being normal): Not applicable, acute subarachnoid hemorrhage. IMPRESSION: 1. Positive for bilateral Acute Subarachnoid Hemorrhage. Small to moderate volume relatively sparing the basilar cisterns and sylvian fissures, more pronounced over the convexities, a pattern most resembling sequelae of trauma. Coagulopathy might appear similar. 2. No intracranial mass effect, midline shift, acute cortically based infarct. No IVH or ventriculomegaly. 3. No skull fracture or scalp hematoma identified. These results were communicated to Dr. Otelia Limes at 7:20 am on 08/12/2023 by text page via the [facility] messaging system. Electronically Signed   By: Odessa Fleming M.D.   On: 08/12/2023 07:21    Assessment/Plan: Patient with small bilateral SAH following a syncopal event at home. Hold ASA and Plavix for at least 5 days. Follow up with PCP to determine if patient should remain on these medications. No need for outpatient follow up with Neurosurgery. Neurosurgery will sign off. Please re-consult if we can be of further assistance.   LOS: 0 days    Val Eagle, DNP, AGNP-C Nurse Practitioner  [facility] [address], [place], [place] [address] P: [phone]    F: [phone]  08/13/2023, 9:20 AM

## 2023-08-13 NOTE — ED Notes (Signed)
Patient transported to CT 

## 2023-08-13 NOTE — ED Notes (Signed)
Glucose 132

## 2023-08-13 NOTE — ED Notes (Signed)
ED TO INPATIENT HANDOFF REPORT  ED Nurse Name and Phone #: Percival Spanish [phone]  S Name/Age/Gender [name] 72 y.o. male Room/Bed: 003C/003C  Code Status   Code Status: Full Code  Home/SNF/Other Home Patient oriented to: self, place, time, and situation Is this baseline? Yes   Triage Complete: Triage complete  Chief Complaint SAH (subarachnoid hemorrhage) (HCC) [I60.9]  Triage Note No notes on file   Allergies Allergies  Allergen Reactions   Iohexol Other (See Comments)     Code: HIVES, Desc: pt recieves 50mg  benadryl 1 hour prior to scan    Ivp Dye [Iodinated Contrast Media] Rash   Morphine And Codeine Anxiety    hallucinations   Other Rash    chlorascrub when starting IV   Povidone Iodine Rash    Other reaction(s): heated rash    Level of Care/Admitting Diagnosis ED Disposition     ED Disposition  Admit   Condition  --   Comment  Hospital [facility]  [facility] [[id number]]  Level of Care: Progressive [102]  Admit to Progressive based on following criteria: NEUROLOGICAL AND NEUROSURGICAL complex patients with significant risk of instability, who do not meet ICU criteria, yet require close observation or frequent assessment (< / = every 2 - 4 hours) with medical / nursing intervention.  May place patient in observation at [facility] or [facility] if equivalent level of care is available:: No  Covid Evaluation: Asymptomatic - no recent exposure (last 10 days) testing not required  Diagnosis: SAH (subarachnoid hemorrhage) [facility]) [[id number]]  Admitting Physician: Orland Mustard [[id number]]  Attending Physician: Orland Mustard [[id number]]          B Medical/Surgery History Past Medical History:  Diagnosis Date   AAA (abdominal aortic aneurysm) (HCC)    a. 3.4 x 3.5 on scan 02/28/13.   Allergic reaction to contrast dye    Atherosclerosis    CAD (coronary artery disease)    a. stenting of RCA/LCx 2004. b. [place] 08/2013:  s/p DES to LCx for severe ISR.    Diabetes mellitus    Diverticulosis    Emphysema lung (HCC)    Emphysema of lung (HCC)    Encounter for therapeutic drug monitoring 06/06/2016   GERD (gastroesophageal reflux disease)    Hemorrhoids    Hepatic steatosis    History of radiation therapy 09/07/20-09/22/20   SBRT left lung ; Dr Antony Blackbird   HTN (hypertension)    Hyperlipidemia    Kidney stone    Lung cancer [facility]) dx'd 2004   chemo/xrt comp; tarceva ongoing- January 2022 radiation   Stroke [facility])    Past Surgical History:  Procedure Laterality Date   ANGIOPLASTY / STENTING FEMORAL  08/14/2013   2004-groin   CATARACT EXTRACTION     COLONOSCOPY     LEFT HEART CATHETERIZATION WITH CORONARY ANGIOGRAM N/A 09/08/2013   Procedure: LEFT HEART CATHETERIZATION WITH CORONARY ANGIOGRAM;  Surgeon: Micheline Chapman, MD;  Location: [facility] CATH LAB;  Service: Cardiovascular;  Laterality: N/A;   left partial lobectomy     WISDOM TOOTH EXTRACTION       A IV Location/Drains/Wounds Patient Lines/Drains/Airways Status     Active Line/Drains/Airways     Name Placement date Placement time Site Days   Peripheral IV 08/12/23 18 G Anterior;Distal;Right;Upper Arm 08/12/23  0700  Arm  1            Intake/Output Last 24 hours No intake or output data in the 24 hours ending 08/13/23 1110  Labs/Imaging Results for orders placed or performed during the hospital encounter of 08/12/23 (from the past 48 hours)  Protime-INR     Status: None   Collection Time: 08/12/23  7:05 AM  Result Value Ref Range   Prothrombin Time 13.3 11.4 - 15.2 seconds   INR 1.0 0.8 - 1.2    Comment: (NOTE) INR goal varies based on device and disease states. Performed at [facility], [address], [place], [place] [address]   APTT     Status: None   Collection Time: 08/12/23  7:05 AM  Result Value Ref Range   aPTT 29 24 - 36 seconds    Comment: Performed at [facility], [address] [place], [place] [address]  CBC     Status:  Abnormal   Collection Time: 08/12/23  7:05 AM  Result Value Ref Range   WBC 15.2 (H) 4.0 - 10.5 K/uL   RBC 4.82 4.22 - 5.81 MIL/uL   Hemoglobin 15.9 13.0 - 17.0 g/dL   HCT 86.5 78.4 - 69.6 %   MCV 95.4 80.0 - 100.0 fL   MCH 33.0 26.0 - 34.0 pg   MCHC 34.6 30.0 - 36.0 g/dL   RDW 29.5 28.4 - 13.2 %   Platelets 227 150 - 400 K/uL   nRBC 0.0 0.0 - 0.2 %    Comment: Performed at [facility], [address], [place], [place] [address]  Differential     Status: Abnormal   Collection Time: 08/12/23  7:05 AM  Result Value Ref Range   Neutrophils Relative % 71 %   Neutro Abs 10.9 (H) 1.7 - 7.7 K/uL   Lymphocytes Relative 18 %   Lymphs Abs 2.7 0.7 - 4.0 K/uL   Monocytes Relative 8 %   Monocytes Absolute 1.2 (H) 0.1 - 1.0 K/uL   Eosinophils Relative 2 %   Eosinophils Absolute 0.3 0.0 - 0.5 K/uL   Basophils Relative 0 %   Basophils Absolute 0.1 0.0 - 0.1 K/uL   Immature Granulocytes 1 %   Abs Immature Granulocytes 0.08 (H) 0.00 - 0.07 K/uL    Comment: Performed at [facility], [address], [place], [place] [address]  Comprehensive metabolic panel     Status: Abnormal   Collection Time: 08/12/23  7:05 AM  Result Value Ref Range   Sodium 137 135 - 145 mmol/L   Potassium 5.1 3.5 - 5.1 mmol/L   Chloride 106 98 - 111 mmol/L   CO2 20 (L) 22 - 32 mmol/L   Glucose, Bld 169 (H) 70 - 99 mg/dL    Comment: Glucose reference range applies only to samples taken after fasting for at least 8 hours.   BUN 13 8 - 23 mg/dL   Creatinine, Ser 6.64 (H) 0.61 - 1.24 mg/dL   Calcium 8.1 (L) 8.9 - 10.3 mg/dL   Total Protein 6.2 (L) 6.5 - 8.1 g/dL   Albumin 3.5 3.5 - 5.0 g/dL   AST 41 15 - 41 U/L   ALT 30 0 - 44 U/L   Alkaline Phosphatase 76 38 - 126 U/L   Total Bilirubin 1.9 (H) <1.2 mg/dL   GFR, Estimated 51 (L) >60 mL/min    Comment: (NOTE) Calculated using the CKD-EPI Creatinine Equation (2021)    Anion gap 11 5 - 15    Comment: Performed at [facility], [address],  [place], [place] [address]  Ethanol     Status: None  Collection Time: 08/12/23  7:05 AM  Result Value Ref Range   Alcohol, Ethyl (B) <10 <10 mg/dL    Comment: (NOTE) Lowest detectable limit for serum alcohol is 10 mg/dL.  For medical purposes only. Performed at [facility], [address] [place], [place] [address]   CBG monitoring, ED     Status: Abnormal   Collection Time: 08/12/23  7:06 AM  Result Value Ref Range   Glucose-Capillary 177 (H) 70 - 99 mg/dL    Comment: Glucose reference range applies only to samples taken after fasting for at least 8 hours.  I-stat chem 8, ED     Status: Abnormal   Collection Time: 08/12/23  7:16 AM  Result Value Ref Range   Sodium 139 135 - 145 mmol/L   Potassium 5.6 (H) 3.5 - 5.1 mmol/L   Chloride 107 98 - 111 mmol/L   BUN 17 8 - 23 mg/dL   Creatinine, Ser 6.04 (H) 0.61 - 1.24 mg/dL   Glucose, Bld 540 (H) 70 - 99 mg/dL    Comment: Glucose reference range applies only to samples taken after fasting for at least 8 hours.   Calcium, Ion 1.00 (L) 1.15 - 1.40 mmol/L   TCO2 23 22 - 32 mmol/L   Hemoglobin 15.0 13.0 - 17.0 g/dL   HCT 98.1 19.1 - 47.8 %  Magnesium     Status: Abnormal   Collection Time: 08/12/23 11:15 AM  Result Value Ref Range   Magnesium 1.5 (L) 1.7 - 2.4 mg/dL    Comment: HEMOLYSIS AT THIS LEVEL MAY AFFECT RESULT Performed at [facility], [address], [place], [place] [address]   CK     Status: None   Collection Time: 08/12/23 11:15 AM  Result Value Ref Range   Total CK 94 49 - 397 U/L    Comment: Performed at [facility], [address], [place], [place] [address]  TSH     Status: None   Collection Time: 08/12/23 11:16 AM  Result Value Ref Range   TSH 1.567 0.350 - 4.500 uIU/mL    Comment: Performed by a 3rd Generation assay with a functional sensitivity of <=0.01 uIU/mL. Performed at [facility], [address], [place], [place] [address]   Hemoglobin A1c     Status: Abnormal   Collection  Time: 08/12/23 12:14 PM  Result Value Ref Range   Hgb A1c MFr Bld 7.3 (H) 4.8 - 5.6 %    Comment: (NOTE)         Prediabetes: 5.7 - 6.4         Diabetes: >6.4         Glycemic control for adults with diabetes: <7.0    Mean Plasma Glucose 163 mg/dL    Comment: (NOTE) Performed At: [facility] [address] [place], [place] [id number] Jolene Schimke MD LK:[id number]   CBG monitoring, ED     Status: Abnormal   Collection Time: 08/12/23 12:47 PM  Result Value Ref Range   Glucose-Capillary 141 (H) 70 - 99 mg/dL    Comment: Glucose reference range applies only to samples taken after fasting for at least 8 hours.  CBG monitoring, ED     Status: Abnormal   Collection Time: 08/12/23  6:42 PM  Result Value Ref Range   Glucose-Capillary 116 (H) 70 - 99 mg/dL    Comment: Glucose reference range applies only to samples taken after fasting for at least 8 hours.  Urinalysis,  Routine w reflex microscopic -Urine, Clean Catch     Status: Abnormal   Collection Time: 08/12/23  7:52 PM  Result Value Ref Range   Color, Urine YELLOW YELLOW   APPearance CLEAR CLEAR   Specific Gravity, Urine 1.028 1.005 - 1.030   pH 6.0 5.0 - 8.0   Glucose, UA >=500 (A) NEGATIVE mg/dL   Hgb urine dipstick NEGATIVE NEGATIVE   Bilirubin Urine NEGATIVE NEGATIVE   Ketones, ur NEGATIVE NEGATIVE mg/dL   Protein, ur NEGATIVE NEGATIVE mg/dL   Nitrite NEGATIVE NEGATIVE   Leukocytes,Ua NEGATIVE NEGATIVE   RBC / HPF 0-5 0 - 5 RBC/hpf   WBC, UA 0-5 0 - 5 WBC/hpf   Bacteria, UA NONE SEEN NONE SEEN   Squamous Epithelial / HPF 0-5 0 - 5 /HPF    Comment: Performed at [facility], [address] [place], [place] [address]  CBG monitoring, ED     Status: Abnormal   Collection Time: 08/12/23  9:54 PM  Result Value Ref Range   Glucose-Capillary 147 (H) 70 - 99 mg/dL    Comment: Glucose reference range applies only to samples taken after fasting for at least 8 hours.  Basic metabolic panel     Status: Abnormal    Collection Time: 08/13/23  4:54 AM  Result Value Ref Range   Sodium 140 135 - 145 mmol/L   Potassium 4.2 3.5 - 5.1 mmol/L   Chloride 110 98 - 111 mmol/L   CO2 21 (L) 22 - 32 mmol/L   Glucose, Bld 107 (H) 70 - 99 mg/dL    Comment: Glucose reference range applies only to samples taken after fasting for at least 8 hours.   BUN 14 8 - 23 mg/dL   Creatinine, Ser 8.46 (H) 0.61 - 1.24 mg/dL   Calcium 8.1 (L) 8.9 - 10.3 mg/dL   GFR, Estimated 46 (L) >60 mL/min    Comment: (NOTE) Calculated using the CKD-EPI Creatinine Equation (2021)    Anion gap 9 5 - 15    Comment: Performed at [facility], [address], [place], [place] [address]  CBC     Status: Abnormal   Collection Time: 08/13/23  4:54 AM  Result Value Ref Range   WBC 11.3 (H) 4.0 - 10.5 K/uL   RBC 4.66 4.22 - 5.81 MIL/uL   Hemoglobin 15.5 13.0 - 17.0 g/dL   HCT 28.4 13.2 - 44.0 %   MCV 93.3 80.0 - 100.0 fL   MCH 33.3 26.0 - 34.0 pg   MCHC 35.6 30.0 - 36.0 g/dL   RDW 10.2 72.5 - 36.6 %   Platelets 180 150 - 400 K/uL   nRBC 0.0 0.0 - 0.2 %    Comment: Performed at [facility], [address] [place], [place] [address]  Magnesium     Status: None   Collection Time: 08/13/23  4:54 AM  Result Value Ref Range   Magnesium 1.9 1.7 - 2.4 mg/dL    Comment: Performed at [facility], [address], [place], [place] [address]  CBG monitoring, ED     Status: Abnormal   Collection Time: 08/13/23  8:00 AM  Result Value Ref Range   Glucose-Capillary 132 (H) 70 - 99 mg/dL    Comment: Glucose reference range applies only to samples taken after fasting for at least 8 hours.   CT HEAD WO CONTRAST ( ) Result Date: 08/13/2023 CLINICAL DATA:  Provided history: Subarachnoid hemorrhage. EXAM: CT HEAD WITHOUT  CONTRAST TECHNIQUE: Contiguous axial images were obtained from the base of the skull through the vertex without intravenous contrast. RADIATION DOSE REDUCTION: This exam was performed according to the departmental  dose-optimization program which includes automated exposure control, adjustment of the mA and/or kV according to patient size and/or use of iterative reconstruction technique. COMPARISON:  Brain MRI 08/12/2023.  Head CT 08/12/2023. FINDINGS: Brain: Mild generalized cerebral volume loss. Small-to-moderate volume acute subarachnoid hemorrhage again noted along the bilateral cerebral hemispheres (greatest along the frontoparietal convexities), not significantly changed from the prior head CT and brain MRI examinations of 08/12/2023. No demarcated cortical infarct. No evidence of an intracranial mass. No midline shift or hydrocephalus. Vascular: No hyperdense vessel.  Atherosclerotic calcifications. Skull: No calvarial fracture or aggressive osseous lesion. Sinuses/Orbits: No mass or acute finding within the imaged orbits. No significant paranasal sinus disease at the imaged levels. IMPRESSION: 1. Small-to-moderate volume acute subarachnoid hemorrhage again noted along the bilateral cerebral hemispheres (greatest along the frontoparietal convexities), not significantly changed from the prior head CT and brain MRI examinations of 08/12/2023. 2. No evidence of an interval acute intracranial abnormality. Electronically Signed   By: Jackey Loge D.O.   On: 08/13/2023 07:27   MR BRAIN WO CONTRAST Result Date: 08/12/2023 CLINICAL DATA:  72 year old male with syncope, code stroke presentation this morning with subarachnoid hemorrhage. EXAM: MRI HEAD WITHOUT CONTRAST TECHNIQUE: Multiplanar, multiecho pulse sequences of the brain and surrounding structures were obtained without intravenous contrast. COMPARISON:  Head CT 0712 hours today. FINDINGS: Brain: Scattered subarachnoid susceptibility on DWI, most pronounced at the vertex, compatible with known blood products. No restricted diffusion or evidence of acute infarction. No midline shift, mass effect, or evidence of intracranial mass lesion. No ventriculomegaly. No  intraventricular hemorrhage. Scattered and layering subarachnoid blood as seen by head CT, most apparent at the bilateral vertex on FLAIR and SWI imaging. No subdural or extradural hemorrhage or collection. No convincing underlying chronic microhemorrhage or superficial siderosis. And otherwise largely normal for age gray and white matter signal throughout the brain. No definite cortical encephalomalacia. Vascular: Major intracranial vascular flow voids are preserved. Skull and upper cervical spine: Negative visible cervical spine. Visualized bone marrow signal is within normal limits. Sinuses/Orbits: Postoperative changes to both globes. Fluid, mucosal thickening again noted in the maxillary sinuses. Other: Mastoids are well aerated. Grossly normal visible internal auditory structures. Negative visible scalp and face. IMPRESSION: 1. Stable small volume bilateral Subarachnoid Hemorrhage, pattern unchanged from earlier Head CT. No IVH, ventriculomegaly, or other complicating features. 2. No evidence of acute infarct, and otherwise largely normal for age noncontrast MRI appearance of the Brain. Electronically Signed   By: Odessa Fleming M.D.   On: 08/12/2023 11:09   CT HEAD CODE STROKE WO CONTRAST Result Date: 08/12/2023 CLINICAL DATA:  Code stroke.  72 year old male neurologic deficit. EXAM: CT HEAD WITHOUT CONTRAST TECHNIQUE: Contiguous axial images were obtained from the base of the skull through the vertex without intravenous contrast. RADIATION DOSE REDUCTION: This exam was performed according to the departmental dose-optimization program which includes automated exposure control, adjustment of the mA and/or kV according to patient size and/or use of iterative reconstruction technique. COMPARISON:  Head CT 02/11/2007. FINDINGS: Brain: Bilateral subarachnoid hemorrhage. Scattered bilateral hyperdense blood in the cerebral sulci which is most apparent at the bilateral vertex although there is anterior frontal  convexity involvement near the anterior cranial fossa (series 2, image 15). There is occipital lobe sulcal involvement more pronounced on the right. However, the bilateral sylvian fissures  and the bilateral basilar cisterns are relatively spared. No intraventricular hemorrhage or ventriculomegaly identified. No midline shift or intracranial mass effect. No convincing subdural blood. No cortically based acute infarct identified. Mild for age patchy bilateral white matter hypodensity. Vascular: Calcified atherosclerosis at the skull base. No suspicious intracranial vascular hyperdensity. Skull: Intact.  No acute osseous abnormality identified. Sinuses/Orbits: Maxillary sinus mucoperiosteal thickening with small new fluid level since 2008. Other Visualized paranasal sinuses and mastoids are stable and well aerated. Other: Postoperative changes to the globes since 2008. No gaze deviation. Visualized scalp soft tissues are within normal limits. ASPECTS [facility] Stroke Program Early CT Score) Total score (0-10 with 10 being normal): Not applicable, acute subarachnoid hemorrhage. IMPRESSION: 1. Positive for bilateral Acute Subarachnoid Hemorrhage. Small to moderate volume relatively sparing the basilar cisterns and sylvian fissures, more pronounced over the convexities, a pattern most resembling sequelae of trauma. Coagulopathy might appear similar. 2. No intracranial mass effect, midline shift, acute cortically based infarct. No IVH or ventriculomegaly. 3. No skull fracture or scalp hematoma identified. These results were communicated to Dr. Otelia Limes at 7:20 am on 08/12/2023 by text page via the [facility] messaging system. Electronically Signed   By: Odessa Fleming M.D.   On: 08/12/2023 07:21    Pending Labs Unresulted Labs (From admission, onward)    None       Vitals/Pain Today's Vitals   08/13/23 0915 08/13/23 1000 08/13/23 1026 08/13/23 1027  BP: (!) 166/93 (!) 144/77    Pulse: (!) 124 (!) 106    Resp: 18 19     Temp:    (!) 97.5 F (36.4 C)  TempSrc:    Oral  SpO2: 97% 100%    Weight:      Height:      PainSc:   2      Isolation Precautions No active isolations  Medications Medications  acetaminophen (TYLENOL) tablet 650 mg (650 mg Oral Given 08/13/23 0737)    Or  acetaminophen (TYLENOL) suppository 650 mg ( Rectal See Alternative 08/13/23 0737)  ondansetron (ZOFRAN) tablet 4 mg (has no administration in time range)    Or  ondansetron (ZOFRAN) injection 4 mg (has no administration in time range)  doxycycline (VIBRA-TABS) tablet 100 mg (100 mg Oral Given 08/13/23 1028)  atorvastatin (LIPITOR) tablet 40 mg (40 mg Oral Given 08/13/23 1028)  linagliptin (TRADJENTA) tablet 5 mg (5 mg Oral Given 08/13/23 1028)  olopatadine (PATANOL) 0.1 % ophthalmic solution 1 drop (has no administration in time range)  insulin aspart (novoLOG) injection 0-9 Units ( Subcutaneous Not Given 08/13/23 0808)  famotidine (PEPCID) tablet 20 mg (20 mg Oral Given 08/13/23 1028)  levothyroxine (SYNTHROID) tablet 25 mcg (25 mcg Oral Given 08/13/23 0508)  fluticasone (FLONASE) 50 MCG/ACT nasal spray 2 spray (has no administration in time range)  DULoxetine (CYMBALTA) DR capsule 60 mg (60 mg Oral Given 08/12/23 1819)  prednisoLONE acetate (PRED FORTE) 1 % ophthalmic suspension 1 drop (has no administration in time range)  sodium chloride flush (NS) 0.9 % injection 3 mL (3 mLs Intravenous Given 08/12/23 0759)  acetaminophen (TYLENOL) tablet 1,000 mg (1,000 mg Oral Given 08/12/23 0823)  magnesium sulfate IVPB 2 g 50 mL (0 g Intravenous Stopped 08/12/23 1923)  acetaminophen (TYLENOL) 325 MG tablet (  Given 08/13/23 0130)  sodium chloride 0.9 % bolus 500 mL (500 mLs Intravenous New Bag/Given 08/13/23 1029)    Mobility walks     Focused Assessments Neuro Assessment Handoff:  Swallow screen pass? Yes  Cardiac Rhythm:  Normal sinus rhythm NIH Stroke Scale  Dizziness Present: No Headache Present: Yes Interval: Shift  assessment Level of Consciousness (1a.)   : Alert, keenly responsive LOC Questions (1b. )   : Answers both questions correctly LOC Commands (1c. )   : Performs both tasks correctly Best Gaze (2. )  : Normal Visual (3. )  : No visual loss Facial Palsy (4. )    : Minor paralysis Motor Arm, Left (5a. )   : No drift Motor Arm, Right (5b. ) : No drift Motor Leg, Left (6a. )  : No drift Motor Leg, Right (6b. ) : No drift Limb Ataxia (7. ): Absent Sensory (8. )  : Normal, no sensory loss Best Language (9. )  : No aphasia Dysarthria (10. ): Normal Extinction/Inattention (11.)   : No Abnormality Complete NIHSS TOTAL: 1 Last date known well: 08/12/23 Last time known well: 0100 Neuro Assessment: Within Defined Limits Neuro Checks:   Initial (08/12/23 0710)  Has TPA been given? No If patient is a Neuro Trauma and patient is going to OR before floor call report to 4N Charge nurse: [phone] or [phone]   R Recommendations: See Admitting Provider Note  Report given to:   Additional Notes:

## 2023-08-13 NOTE — ED Notes (Signed)
Pt placed on hosp bed.

## 2023-08-13 NOTE — Progress Notes (Signed)
PROGRESS NOTE  [name] IEP:[id number] DOB: 01-08-1951 DOA: 08/12/2023 PCP: Charlane Ferretti, DO   LOS: 0 days   Brief Narrative / Interim history: 72 y.o. male with medical history significant of   hypertension, hyperlipidemia, COPD, GERD, coronary artery disease (PCI to RCA and LCx in 2004, stent to LCx in 2015), diastolic CHF (EF 66-06% 05/2017), T2DM and adenocarcinoma of the lung (Dx 2004) who presented to ED with code stroke. He got up in the night to get something to drink and doesn't remember anything past this until he woke up in the ambulance. His wife states she heard a thud and he was on the ground in his room. She denies any shaking, urinary incontinence. He was not really responding fro several minutes. When EMS got there he had some word difficulty finding issues and some confusion.  Imaging in the ED showed bilateral acute subarachnoid hemorrhage.  Neurosurgery was consulted and he was admitted to the hospital.  Subjective / 24h Interval events: Doing well mild HA  Assesement and Plan: Principal Problem:   SAH (subarachnoid hemorrhage) (HCC) Active Problems:   Syncope and collapse   Leukocytosis   Type 2 diabetes mellitus with other specified complication (HCC)   Hypertension   Lung cancer (HCC)   Chronic kidney disease, stage 3b (HCC)   Chronic diastolic CHF (congestive heart failure) (HCC)   Coronary artery disease involving native coronary artery of native heart without angina pectoris   Gastro-esophageal reflux disease without esophagitis   Hyperlipidemia   Hypothyroidism   Insomnia   Abdominal aortic aneurysm without rupture (HCC)   Principal problem SAH (subarachnoid hemorrhage) - 72 year old presenting to ED after unwitnessed fall, found by wife on ground and not responsive found to have bilateral SAH.  He was found to be positive.  Vital signs were negative goals.  Neurology has been consulted repeat CT scan this morning shows to dry.  Plavix and  aspirin on on hold per neurosurgery  Active problems  Syncope and collapse -Unwitnessed with no prodromal symptoms, he was orthostatic, significantly and is again orthostatic this afternoon. He is not very symptomatic, but given his recent syncopal episode would favor to continue to hold antihypertensives and provide additional fluids overnight.  MRI of the brain check on admission is without acute findings. Has been having loose stools but these are acute on chronic and not necessarily severe -Most recent echocardiogram as an outpatient shows EF 55-60%, grade 1 dd  Type 2 diabetes mellitus -on SSI   Lab Results  Component Value Date   HGBA1C 7.3 (H) 08/12/2023   CBG (last 3)  Recent Labs    08/12/23 2154 08/13/23 0800 08/13/23 1228  GLUCAP 147* 132* 124*   Hypertension - Blood pressures soft in setting of possible syncope. Hold antihypertensives for now   Chronic kidney disease, stage 3b (HCC) - Creatinine baseline around 1.5. Stable, continue to monitor    Lung cancer [facility]) - Diagnosed nearly 20 years ago, continue tarceva    Chronic diastolic CHF (congestive heart failure) - dry, fluids overnight   Coronary artery disease involving native coronary artery of native heart without angina pectoris -remote PCI of the right coronary artery left circumflex vessel in 2004 and repeat stenting of the left circumflex in 2015  -hold DAPT -no chest pain   Gastro-esophageal reflux disease without esophagitis -Continue pepcid    Hyperlipidemia - Continue lipitor 40mg  daily    Hypothyroidism - TSH normal   Insomnia - Continue sleep meds  Abdominal aortic aneurysm without rupture (HCC) - Followed by cardiology. Last scan 4/24: infrarenal abdominal aortic ultrasound demonstrating an aneurysm of 3.9 cm. Repeat duplex screening yearly   Scheduled Meds:  atorvastatin  40 mg Oral Daily   doxycycline  100 mg Oral Daily   DULoxetine  60 mg Oral QPM   famotidine  20 mg Oral BID    fluticasone  2 spray Each Nare Daily   insulin aspart  0-9 Units Subcutaneous TID WC   levothyroxine  25 mcg Oral Q0600   linagliptin  5 mg Oral Daily   olopatadine  1 drop Both Eyes Daily   prednisoLONE acetate  1 drop Both Eyes Daily   Continuous Infusions:  sodium chloride     PRN Meds:.acetaminophen **OR** acetaminophen, nicotine polacrilex, ondansetron **OR** ondansetron (ZOFRAN) IV  Current Outpatient Medications  Medication Instructions   ACCU-CHEK AVIVA PLUS test strip 1 each, Daily   aspirin EC 81 mg, Daily   atorvastatin (LIPITOR) 40 mg, Daily   clopidogrel (PLAVIX) 75 MG tablet TAKE 1 TABLET (75 MG TOTAL) BY MOUTH DAILY.   cyanocobalamin 1,000 mcg, Daily   doxycycline (MONODOX) 100 mg, Oral, Daily   erlotinib (TARCEVA) 100 mg, Oral, Daily, Take on an empty stomach 1 hour before meals or 2 hours after.   famotidine (PEPCID) 40 mg, Oral, 2 times daily, Please call [phone] to schedule an office visit for more refills.   glimepiride (AMARYL) 4 mg, Oral, Every morning   ipratropium (ATROVENT) 0.06 % nasal spray 2 sprays, Each Nare, Every 6 hours PRN   isosorbide mononitrate (IMDUR) 60 mg, Oral, Daily   Jardiance 25 mg, Oral, Daily   Lancets (FREESTYLE) lancets 1 each, Other, Daily   levocetirizine (XYZAL) 5 mg, Oral, Daily PRN   levothyroxine (SYNTHROID) 25 mcg, Oral, Every morning   losartan (COZAAR) 25 MG tablet TAKE 1 TABLET BY MOUTH EVERY DAY FOR 90 DAYS   loteprednol (LOTEMAX) 0.5 % ophthalmic suspension 1 drop, Both Eyes, Daily   metoprolol tartrate (LOPRESSOR) 50 mg, Oral, 2 times daily   nitroGLYCERIN (NITROSTAT) 0.4 mg, Sublingual, Every 5 min PRN   nystatin-triamcinolone ointment (MYCOLOG) 1 Application, Topical, As needed   Olopatadine HCl (PATADAY) 0.2 % SOLN 1 drop, Both Eyes, Daily   Olopatadine-Mometasone (RYALTRIS) 665-25 MCG/ACT SUSP 2 sprays, Nasal, 2 times daily   Propylene Glycol, PF, (SYSTANE COMPLETE PF) 0.6 % SOLN 1 drop, Ophthalmic, Every 4 hours  PRN   Tradjenta 5 mg, Daily   Vitamin D3 50 mcg, Oral, Daily, Takes 50 mcg/day   zaleplon (SONATA) 20 mg, Daily at bedtime    Diet Orders (From admission, onward)     Start     Ordered   08/12/23 0940  Diet Heart Room service appropriate? Yes; Fluid consistency: Thin  Diet effective now       Comments: Once passes swallow study  Question Answer Comment  Room service appropriate? Yes   Fluid consistency: Thin      08/12/23 0939            DVT prophylaxis: SCDs Start: 08/12/23 2202   Lab Results  Component Value Date   PLT 180 08/13/2023      Code Status: Full Code  Family Communication: wife at bedside   Status is: Inpatient  Level of care: Progressive  Consultants:  Neurosurgery  Neurology   Objective: Vitals:   08/13/23 1227 08/13/23 1258 08/13/23 1259 08/13/23 1300  BP: (!) 155/87 (!) 165/82 (!) 148/85 105/63  Pulse: 83  Resp: 11     Temp: 97.9 F (36.6 C)     TempSrc: Oral     SpO2: 95%     Weight:      Height:        Intake/Output Summary (Last 24 hours) at 08/13/2023 1401 Last data filed at 08/13/2023 1112 Gross per 24 hour  Intake 500 ml  Output --  Net 500 ml   Wt Readings from Last 3 Encounters:  08/12/23 88.2 kg  05/22/23 88.4 kg  04/19/23 86.8 kg    Examination:  Constitutional: NAD Eyes: no scleral icterus ENMT: Mucous membranes are moist.  Neck: normal, supple Respiratory: clear to auscultation bilaterally, no wheezing, no crackles.  Cardiovascular: Regular rate and rhythm, no murmurs / rubs / gallops. No LE edema.  Abdomen: non distended, no tenderness. Bowel sounds positive.  Musculoskeletal: no clubbing / cyanosis.   Data Reviewed: I have independently reviewed following labs and imaging studies   CBC Recent Labs  Lab 08/12/23 0705 08/12/23 0716 08/13/23 0454  WBC 15.2*  --  11.3*  HGB 15.9 15.0 15.5  HCT 46.0 44.0 43.5  PLT 227  --  180  MCV 95.4  --  93.3  MCH 33.0  --  33.3  MCHC 34.6  --  35.6  RDW  14.3  --  14.0  LYMPHSABS 2.7  --   --   MONOABS 1.2*  --   --   EOSABS 0.3  --   --   BASOSABS 0.1  --   --     Recent Labs  Lab 08/12/23 0705 08/12/23 0716 08/12/23 1115 08/12/23 1116 08/12/23 1214 08/13/23 0454  NA 137 139  --   --   --  140  K 5.1 5.6*  --   --   --  4.2  CL 106 107  --   --   --  110  CO2 20*  --   --   --   --  21*  GLUCOSE 169* 169*  --   --   --  107*  BUN 13 17  --   --   --  14  CREATININE 1.45* 1.50*  --   --   --  1.57*  CALCIUM 8.1*  --   --   --   --  8.1*  AST 41  --   --   --   --   --   ALT 30  --   --   --   --   --   ALKPHOS 76  --   --   --   --   --   BILITOT 1.9*  --   --   --   --   --   ALBUMIN 3.5  --   --   --   --   --   MG  --   --  1.5*  --   --  1.9  INR 1.0  --   --   --   --   --   TSH  --   --   --  1.567  --   --   HGBA1C  --   --   --   --  7.3*  --     ------------------------------------------------------------------------------------------------------------------ No results for input(s): "CHOL", "HDL", "LDLCALC", "TRIG", "CHOLHDL", "LDLDIRECT" in the last 72 hours.  Lab Results  Component Value Date   HGBA1C 7.3 (H) 08/12/2023   ------------------------------------------------------------------------------------------------------------------ Recent Labs    08/12/23  1116  TSH 1.567    Cardiac Enzymes No results for input(s): "CKMB", "TROPONINI", "MYOGLOBIN" in the last 168 hours.  Invalid input(s): "CK" ------------------------------------------------------------------------------------------------------------------ No results found for: "BNP"  CBG: Recent Labs  Lab 08/12/23 1247 08/12/23 1842 08/12/23 2154 08/13/23 0800 08/13/23 1228  GLUCAP 141* 116* 147* 132* 124*    No results found for this or any previous visit (from the past 240 hours).   Radiology Studies: CT HEAD WO CONTRAST ( ) Result Date: 08/13/2023 CLINICAL DATA:  Provided history: Subarachnoid hemorrhage. EXAM: CT HEAD WITHOUT  CONTRAST TECHNIQUE: Contiguous axial images were obtained from the base of the skull through the vertex without intravenous contrast. RADIATION DOSE REDUCTION: This exam was performed according to the departmental dose-optimization program which includes automated exposure control, adjustment of the mA and/or kV according to patient size and/or use of iterative reconstruction technique. COMPARISON:  Brain MRI 08/12/2023.  Head CT 08/12/2023. FINDINGS: Brain: Mild generalized cerebral volume loss. Small-to-moderate volume acute subarachnoid hemorrhage again noted along the bilateral cerebral hemispheres (greatest along the frontoparietal convexities), not significantly changed from the prior head CT and brain MRI examinations of 08/12/2023. No demarcated cortical infarct. No evidence of an intracranial mass. No midline shift or hydrocephalus. Vascular: No hyperdense vessel.  Atherosclerotic calcifications. Skull: No calvarial fracture or aggressive osseous lesion. Sinuses/Orbits: No mass or acute finding within the imaged orbits. No significant paranasal sinus disease at the imaged levels. IMPRESSION: 1. Small-to-moderate volume acute subarachnoid hemorrhage again noted along the bilateral cerebral hemispheres (greatest along the frontoparietal convexities), not significantly changed from the prior head CT and brain MRI examinations of 08/12/2023. 2. No evidence of an interval acute intracranial abnormality. Electronically Signed   By: Jackey Loge D.O.   On: 08/13/2023 07:27     Pamella Pert, MD, PhD [facility]  Between 7 am - 7 pm I am available, please contact me via Amion (for emergencies) or Securechat (non urgent messages)  Between 7 pm - 7 am I am not available, please contact night coverage MD/APP via Amion

## 2023-08-14 ENCOUNTER — Telehealth: Payer: Self-pay | Admitting: Cardiovascular Disease

## 2023-08-14 DIAGNOSIS — I609 Nontraumatic subarachnoid hemorrhage, unspecified: Secondary | ICD-10-CM | POA: Diagnosis not present

## 2023-08-14 LAB — COMPREHENSIVE METABOLIC PANEL
ALT: 23 U/L (ref 0–44)
AST: 23 U/L (ref 15–41)
Albumin: 3.3 g/dL — ABNORMAL LOW (ref 3.5–5.0)
Alkaline Phosphatase: 66 U/L (ref 38–126)
Anion gap: 5 (ref 5–15)
BUN: 11 mg/dL (ref 8–23)
CO2: 22 mmol/L (ref 22–32)
Calcium: 8 mg/dL — ABNORMAL LOW (ref 8.9–10.3)
Chloride: 112 mmol/L — ABNORMAL HIGH (ref 98–111)
Creatinine, Ser: 1.36 mg/dL — ABNORMAL HIGH (ref 0.61–1.24)
GFR, Estimated: 55 mL/min — ABNORMAL LOW (ref >60)
Glucose, Bld: 108 mg/dL — ABNORMAL HIGH (ref 70–99)
Potassium: 5 mmol/L (ref 3.5–5.1)
Sodium: 139 mmol/L (ref 135–145)
Total Bilirubin: 1.4 mg/dL — ABNORMAL HIGH (ref 0.0–1.2)
Total Protein: 5.9 g/dL — ABNORMAL LOW (ref 6.5–8.1)

## 2023-08-14 LAB — CBC
HCT: 42.4 % (ref 39.0–52.0)
Hemoglobin: 15.3 g/dL (ref 13.0–17.0)
MCH: 33.3 pg (ref 26.0–34.0)
MCHC: 36.1 g/dL — ABNORMAL HIGH (ref 30.0–36.0)
MCV: 92.4 fL (ref 80.0–100.0)
Platelets: 174 10*3/uL (ref 150–400)
RBC: 4.59 MIL/uL (ref 4.22–5.81)
RDW: 13.9 % (ref 11.5–15.5)
WBC: 9.8 10*3/uL (ref 4.0–10.5)
nRBC: 0 % (ref 0.0–0.2)

## 2023-08-14 LAB — GLUCOSE, CAPILLARY: Glucose-Capillary: 126 mg/dL — ABNORMAL HIGH (ref 70–99)

## 2023-08-14 LAB — MAGNESIUM: Magnesium: 1.6 mg/dL — ABNORMAL LOW (ref 1.7–2.4)

## 2023-08-14 MED ORDER — ZALEPLON 10 MG PO CAPS: 10.0000 mg | ORAL_CAPSULE | Freq: Every day | ORAL | Status: AC

## 2023-08-14 MED ORDER — MAGNESIUM SULFATE 2 GM/50ML IV SOLN
2.0000 g | Freq: Once | INTRAVENOUS | Status: AC
Start: 2023-08-14 — End: 2023-08-14
  Administered 2023-08-14: 2 g via INTRAVENOUS
  Filled 2023-08-14: qty 50

## 2023-08-14 NOTE — Telephone Encounter (Signed)
 Patient states he had a medical incident over the weekend and he would like to discuss medication changes. He prefers to speak directly with Dr. Margurite nurse if possible, and declines discussing in detail with me at this time. Patient reiterates that he had an accident and this is an urgent matter. May be related to syncopal episodes. See ED notes.

## 2023-08-14 NOTE — Plan of Care (Signed)

## 2023-08-14 NOTE — Progress Notes (Signed)
Patient notified of discharge orders but Magnesium IVPB still infusing.  Wife at bedside.

## 2023-08-14 NOTE — Telephone Encounter (Signed)
Thanks for the update.  Chart reviewed.  I will discuss with him when he returns for follow-up next week.

## 2023-08-14 NOTE — Telephone Encounter (Signed)
Per pt got out of bed the other day and remembers settting glass down and the next he remembers was awakening in the ambulance on the way to [facility] Per pt had brain bleed and was discharged today Appt made with Dr Excell Seltzer for 08/21/23 at 1:40 pm

## 2023-08-14 NOTE — TOC Transition Note (Signed)
 Transition of Care [facility]) - Discharge Note   Patient Details  Name: [name] MRN: [id number] Date of Birth: 1950-12-18  Transition of Care [facility]) CM/SW Contact:  Hendricks KANDICE Her, RN Phone Number: 08/14/2023, 11:00 AM   Clinical Narrative:     Patient to DC to home today No TOC needs identified Family to transport            Patient Goals and CMS Choice            Discharge Placement                       Discharge Plan and Services Additional resources added to the After Visit Summary for                                       Social Drivers of Health (SDOH) Interventions SDOH Screenings   Food Insecurity: No Food Insecurity (08/13/2023)  Housing: Unknown (08/13/2023)  Transportation Needs: No Transportation Needs (08/13/2023)  Utilities: Not At Risk (08/13/2023)  Social Connections: Socially Isolated (08/13/2023)  Tobacco Use: Medium Risk (08/13/2023)     Readmission Risk Interventions     No data to display

## 2023-08-14 NOTE — Discharge Summary (Signed)
 Physician Discharge Summary   Patient: [name] MRN: [id number] DOB: April 20, 1951  Admit date:     08/12/2023  Discharge date: 08/14/23  Discharge Physician: Bernardino KATHEE Come   PCP: [name], DO   Recommendations at discharge:  - STOP taking aspirin  and plavix  until you can follow up with Dr. Margurite office. Neurosurgery recommends holding these at least 5 days.  - Your heart monitor only showed a normal rhythm throughout your admission here. Cardiology's office may suggest an outpatient cardiac monitor if episodes continue. - Only get up with assistance for the next week until you know you aren't having the lightheaded symptoms. To help reduce risk of orthostatic hypotension, do NOT take imdur  until you follow up with either Dr. Wonda or Dr. Valentin.  - The maximum dose of sonata  you should take based on age is 10mg , and even at that dose, this should be used with caution.  - Seek medical attention if your headache worsens, you notice any new numbness or weakness or another episode.   Discharge Diagnoses: Principal Problem:   SAH (subarachnoid hemorrhage) (HCC) Active Problems:   Syncope and collapse   Leukocytosis   Type 2 diabetes mellitus with other specified complication (HCC)   Hypertension   Lung cancer (HCC)   Chronic kidney disease, stage 3b (HCC)   Chronic diastolic CHF (congestive heart failure) (HCC)   Coronary artery disease involving native coronary artery of native heart without angina pectoris   Gastro-esophageal reflux disease without esophagitis   Hyperlipidemia   Hypothyroidism   Insomnia   Abdominal aortic aneurysm without rupture (HCC)   Subarachnoid hemorrhage [facility])  Future Appointments  Date Time Provider Department Center  08/21/2023  1:40 PM Wonda Sharper, MD [facility]  08/27/2023 10:00 AM Sherrod Sherrod, MD [facility] None  11/20/2023  1:30 PM Kozlow, Camellia PARAS, MD [facility] None                 Hospital Course: 72 y.o. male with  medical history significant of   hypertension, hyperlipidemia, COPD, GERD, coronary artery disease (PCI to RCA and LCx in 2004, stent to LCx in 2015), diastolic CHF (EF 54-49% 05/2017), T2DM and adenocarcinoma of the lung (Dx 2004) who presented to ED with code stroke. He got up in the night to get something to drink and doesn't remember anything past this until he woke up in the ambulance. His wife states she heard a thud and he was on the ground in his room. She denies any shaking, urinary incontinence. He was not really responding fro several minutes. When EMS got there he had some word difficulty finding issues and some confusion.  Imaging in the ED showed bilateral acute subarachnoid hemorrhage.  Neurosurgery was consulted and he was admitted to the hospital.  Antiplatelets were held, repeat CT showed stability. He was found to be orthostatic so HTN medications were held and IVF given with resolution on repeat orthostatic vital signs 12/31. Neurologically intact and requesting discharge, he is cleared to return home in stable condition.  Assessment and Plan: SAH (subarachnoid hemorrhage): Traumatic, stable on recheck CT 12/30. Neurosurgery evaluated, recommended no further work up or intervention except to hold antiplatelets at least 5 days.    Orthostatic hypotension Syncope and collapse -Unwitnessed with no prodromal symptoms, he was orthostatic, significantly, states has had this for some time now. MRI of the brain check on admission is without acute findings. Has been having loose stools but these are acute on chronic and not necessarily  severe. Most recent echocardiogram as an outpatient shows EF 55-60%, grade 1 dd - Hold imdur  until follow up.  - Up with assistance only - consider cardiac monitoring if recurrent episode - Decrease sonata  dosing based on age to reduce risk of fall nocturnally   Type 2 diabetes mellitus: Continue home Tx. HbA1c 7.3%.   Hypertension: BP elevated supine.  Restart home Tx but hold imdur  for now.    Chronic kidney disease, stage 3b (HCC) - Creatinine baseline around 1.5. Stable, continue to monitor    Lung cancer [facility]) - Diagnosed nearly 20 years ago, continue tarceva     Chronic diastolic CHF (congestive heart failure) - dry, fluids overnight   Coronary artery disease involving native coronary artery of native heart without angina pectoris -remote PCI of the right coronary artery left circumflex vessel in 2004 and repeat stenting of the left circumflex in 2015  -hold DAPT -no chest pain   Gastro-esophageal reflux disease without esophagitis -Continue pepcid     Hyperlipidemia - Continue lipitor 40mg  daily    Hypothyroidism - TSH normal   Insomnia: Decrease sonata  dose as discussed   Abdominal aortic aneurysm without rupture (HCC) - Followed by cardiology. Last scan 4/24: infrarenal abdominal aortic ultrasound demonstrating an aneurysm of 3.9 cm. Repeat duplex screening yearly   Consultants: Neurosurgery Procedures performed: None  Disposition: Home Diet recommendation: As tolerated DISCHARGE MEDICATION: Allergies as of 08/14/2023       Reactions   Iohexol  Other (See Comments)    Code: HIVES, Desc: pt recieves 50mg  benadryl  1 hour prior to scan   Ivp Dye [iodinated Contrast Media] Rash   Morphine And Codeine Anxiety   hallucinations   Other Rash   chlorascrub when starting IV   Povidone Iodine Rash   Other reaction(s): heated rash        Medication List     STOP taking these medications    aspirin  EC 81 MG tablet   clopidogrel  75 MG tablet Commonly known as: PLAVIX    isosorbide  mononitrate 60 MG 24 hr tablet Commonly known as: IMDUR        TAKE these medications    Accu-Chek Aviva Plus test strip Generic drug: glucose blood 1 each by Other route daily.   atorvastatin  40 MG tablet Commonly known as: LIPITOR Take 40 mg by mouth daily.   cyanocobalamin  1000 MCG tablet Take 1,000 mcg by mouth daily.    doxycycline  100 MG capsule Commonly known as: MONODOX  Take 1 capsule (100 mg total) by mouth daily.   erlotinib  100 MG tablet Commonly known as: TARCEVA  Take 1 tablet (100 mg total) by mouth daily. Take on an empty stomach 1 hour before meals or 2 hours after.   famotidine  40 MG tablet Commonly known as: PEPCID  Take 1 tablet (40 mg total) by mouth 2 (two) times daily. Please call [phone] to schedule an office visit for more refills.   freestyle lancets 1 each by Other route daily.   glimepiride  2 MG tablet Commonly known as: AMARYL  Take 4 mg by mouth every morning.   ipratropium 0.06 % nasal spray Commonly known as: ATROVENT  Place 2 sprays into both nostrils every 6 (six) hours as needed for rhinitis.   Jardiance  25 MG Tabs tablet Generic drug: empagliflozin  Take 25 mg by mouth daily.   levocetirizine 5 MG tablet Commonly known as: XYZAL  Take 1 tablet (5 mg total) by mouth daily as needed (Can take an extra dose during flare ups.).   levothyroxine  25 MCG tablet Commonly known as:  SYNTHROID  Take 25 mcg by mouth every morning.   losartan  25 MG tablet Commonly known as: COZAAR  TAKE 1 TABLET BY MOUTH EVERY DAY FOR 90 DAYS   loteprednol  0.5 % ophthalmic suspension Commonly known as: Lotemax  Place 1 drop into both eyes daily.   metoprolol  tartrate 50 MG tablet Commonly known as: LOPRESSOR  TAKE 1 TABLET BY MOUTH TWICE A DAY   nitroGLYCERIN  0.4 MG SL tablet Commonly known as: Nitrostat  Place 1 tablet (0.4 mg total) under the tongue every 5 (five) minutes as needed for chest pain (up to 3 doses).   nystatin-triamcinolone ointment Commonly known as: MYCOLOG Apply 1 Application topically as needed.   Olopatadine  HCl 0.2 % Soln Commonly known as: Pataday  Place 1 drop into both eyes daily.   Ryaltris  665-25 MCG/ACT Susp Generic drug: Olopatadine -Mometasone  Place 2 sprays into the nose in the morning and at bedtime.   Systane Complete PF 0.6 % Soln Generic  drug: Propylene Glycol (PF) Apply 1 drop to eye every 4 (four) hours as needed.   Tradjenta  5 MG Tabs tablet Generic drug: linagliptin  Take 5 mg by mouth daily.   Vitamin D3 75 MCG (3000 UT) Tabs Take 50 mcg by mouth daily. Takes 50 mcg/day   zaleplon  10 MG capsule Commonly known as: SONATA  Take 1 capsule (10 mg total) by mouth at bedtime. What changed: how much to take        Follow-up Information     [name], DO. Schedule an appointment as soon as possible for a visit in 1 week(s).   Specialty: Internal Medicine Contact information: [address] [place] [address] [phone]         Wonda Sharper, MD. Schedule an appointment as soon as possible for a visit.   Specialty: Cardiology Contact information: [address] [place] [address] [phone]                Discharge Exam: Fredricka Weights   08/12/23 0816 08/14/23 0500  Weight: 88.2 kg 88.3 kg   No distress wearing jeans with spouse at bedside Clear, nonlabored RRR, no MRG or JVD or edema Alert, oriented, no deficits.  Condition at discharge: stable  The results of significant diagnostics from this hospitalization (including imaging, microbiology, ancillary and laboratory) are listed below for reference.   Imaging Studies: CT HEAD WO CONTRAST ( ) Result Date: 08/13/2023 CLINICAL DATA:  Provided history: Subarachnoid hemorrhage. EXAM: CT HEAD WITHOUT CONTRAST TECHNIQUE: Contiguous axial images were obtained from the base of the skull through the vertex without intravenous contrast. RADIATION DOSE REDUCTION: This exam was performed according to the departmental dose-optimization program which includes automated exposure control, adjustment of the mA and/or kV according to patient size and/or use of iterative reconstruction technique. COMPARISON:  Brain MRI 08/12/2023.  Head CT 08/12/2023. FINDINGS: Brain: Mild generalized cerebral volume loss. Small-to-moderate volume  acute subarachnoid hemorrhage again noted along the bilateral cerebral hemispheres (greatest along the frontoparietal convexities), not significantly changed from the prior head CT and brain MRI examinations of 08/12/2023. No demarcated cortical infarct. No evidence of an intracranial mass. No midline shift or hydrocephalus. Vascular: No hyperdense vessel.  Atherosclerotic calcifications. Skull: No calvarial fracture or aggressive osseous lesion. Sinuses/Orbits: No mass or acute finding within the imaged orbits. No significant paranasal sinus disease at the imaged levels. IMPRESSION: 1. Small-to-moderate volume acute subarachnoid hemorrhage again noted along the bilateral cerebral hemispheres (greatest along the frontoparietal convexities), not significantly changed from the prior head CT and brain MRI examinations of  08/12/2023. 2. No evidence of an interval acute intracranial abnormality. Electronically Signed   By: Rockey Childs D.O.   On: 08/13/2023 07:27   MR BRAIN WO CONTRAST Result Date: 08/12/2023 CLINICAL DATA:  72 year old male with syncope, code stroke presentation this morning with subarachnoid hemorrhage. EXAM: MRI HEAD WITHOUT CONTRAST TECHNIQUE: Multiplanar, multiecho pulse sequences of the brain and surrounding structures were obtained without intravenous contrast. COMPARISON:  Head CT 0712 hours today. FINDINGS: Brain: Scattered subarachnoid susceptibility on DWI, most pronounced at the vertex, compatible with known blood products. No restricted diffusion or evidence of acute infarction. No midline shift, mass effect, or evidence of intracranial mass lesion. No ventriculomegaly. No intraventricular hemorrhage. Scattered and layering subarachnoid blood as seen by head CT, most apparent at the bilateral vertex on FLAIR and SWI imaging. No subdural or extradural hemorrhage or collection. No convincing underlying chronic microhemorrhage or superficial siderosis. And otherwise largely normal for age  gray and white matter signal throughout the brain. No definite cortical encephalomalacia. Vascular: Major intracranial vascular flow voids are preserved. Skull and upper cervical spine: Negative visible cervical spine. Visualized bone marrow signal is within normal limits. Sinuses/Orbits: Postoperative changes to both globes. Fluid, mucosal thickening again noted in the maxillary sinuses. Other: Mastoids are well aerated. Grossly normal visible internal auditory structures. Negative visible scalp and face. IMPRESSION: 1. Stable small volume bilateral Subarachnoid Hemorrhage, pattern unchanged from earlier Head CT. No IVH, ventriculomegaly, or other complicating features. 2. No evidence of acute infarct, and otherwise largely normal for age noncontrast MRI appearance of the Brain. Electronically Signed   By: VEAR Hurst M.D.   On: 08/12/2023 11:09   CT HEAD CODE STROKE WO CONTRAST Result Date: 08/12/2023 CLINICAL DATA:  Code stroke.  72 year old male neurologic deficit. EXAM: CT HEAD WITHOUT CONTRAST TECHNIQUE: Contiguous axial images were obtained from the base of the skull through the vertex without intravenous contrast. RADIATION DOSE REDUCTION: This exam was performed according to the departmental dose-optimization program which includes automated exposure control, adjustment of the mA and/or kV according to patient size and/or use of iterative reconstruction technique. COMPARISON:  Head CT 02/11/2007. FINDINGS: Brain: Bilateral subarachnoid hemorrhage. Scattered bilateral hyperdense blood in the cerebral sulci which is most apparent at the bilateral vertex although there is anterior frontal convexity involvement near the anterior cranial fossa (series 2, image 15). There is occipital lobe sulcal involvement more pronounced on the right. However, the bilateral sylvian fissures and the bilateral basilar cisterns are relatively spared. No intraventricular hemorrhage or ventriculomegaly identified. No midline shift  or intracranial mass effect. No convincing subdural blood. No cortically based acute infarct identified. Mild for age patchy bilateral white matter hypodensity. Vascular: Calcified atherosclerosis at the skull base. No suspicious intracranial vascular hyperdensity. Skull: Intact.  No acute osseous abnormality identified. Sinuses/Orbits: Maxillary sinus mucoperiosteal thickening with small new fluid level since 2008. Other Visualized paranasal sinuses and mastoids are stable and well aerated. Other: Postoperative changes to the globes since 2008. No gaze deviation. Visualized scalp soft tissues are within normal limits. ASPECTS [facility] Stroke Program Early CT Score) Total score (0-10 with 10 being normal): Not applicable, acute subarachnoid hemorrhage. IMPRESSION: 1. Positive for bilateral Acute Subarachnoid Hemorrhage. Small to moderate volume relatively sparing the basilar cisterns and sylvian fissures, more pronounced over the convexities, a pattern most resembling sequelae of trauma. Coagulopathy might appear similar. 2. No intracranial mass effect, midline shift, acute cortically based infarct. No IVH or ventriculomegaly. 3. No skull fracture or scalp hematoma identified. These results were  communicated to Dr. Lindzen at 7:20 am on 08/12/2023 by text page via the [facility] messaging system. Electronically Signed   By: VEAR Hurst M.D.   On: 08/12/2023 07:21    Microbiology: Results for orders placed or performed in visit on 02/10/21  Helicobacter pylori screen-biopsy     Status: None   Collection Time: 02/10/21  4:14 PM   Specimen: Stomach; Tissue  Result Value Ref Range Status   UREASE Negative Negative Final    Labs: CBC: Recent Labs  Lab 08/12/23 0705 08/12/23 0716 08/13/23 0454 08/14/23 0456  WBC 15.2*  --  11.3* 9.8  NEUTROABS 10.9*  --   --   --   HGB 15.9 15.0 15.5 15.3  HCT 46.0 44.0 43.5 42.4  MCV 95.4  --  93.3 92.4  PLT 227  --  180 174   Basic Metabolic Panel: Recent Labs  Lab  08/12/23 0705 08/12/23 0716 08/12/23 1115 08/13/23 0454 08/14/23 0456  NA 137 139  --  140 139  K 5.1 5.6*  --  4.2 5.0  CL 106 107  --  110 112*  CO2 20*  --   --  21* 22  GLUCOSE 169* 169*  --  107* 108*  BUN 13 17  --  14 11  CREATININE 1.45* 1.50*  --  1.57* 1.36*  CALCIUM  8.1*  --   --  8.1* 8.0*  MG  --   --  1.5* 1.9 1.6*   Liver Function Tests: Recent Labs  Lab 08/12/23 0705 08/14/23 0456  AST 41 23  ALT 30 23  ALKPHOS 76 66  BILITOT 1.9* 1.4*  PROT 6.2* 5.9*  ALBUMIN 3.5 3.3*   CBG: Recent Labs  Lab 08/13/23 0800 08/13/23 1228 08/13/23 1654 08/13/23 2156 08/14/23 0903  GLUCAP 132* 124* 132* 140* 126*    Discharge time spent: greater than 30 minutes.  Signed: Bernardino KATHEE Come, MD [facility] 08/14/2023

## 2023-08-21 ENCOUNTER — Ambulatory Visit: Payer: Medicare Other | Attending: Cardiovascular Disease | Admitting: Cardiovascular Disease

## 2023-08-21 ENCOUNTER — Encounter: Payer: Self-pay | Admitting: Cardiovascular Disease

## 2023-08-21 VITALS — BP 130/74 | HR 69 | Ht 72.0 in | Wt 195.2 lb

## 2023-08-21 DIAGNOSIS — E782 Mixed hyperlipidemia: Secondary | ICD-10-CM | POA: Diagnosis not present

## 2023-08-21 DIAGNOSIS — I10 Essential (primary) hypertension: Secondary | ICD-10-CM | POA: Diagnosis not present

## 2023-08-21 DIAGNOSIS — R55 Syncope and collapse: Secondary | ICD-10-CM

## 2023-08-21 DIAGNOSIS — I25119 Atherosclerotic heart disease of native coronary artery with unspecified angina pectoris: Secondary | ICD-10-CM | POA: Diagnosis not present

## 2023-08-21 DIAGNOSIS — I7143 Infrarenal abdominal aortic aneurysm, without rupture: Secondary | ICD-10-CM | POA: Diagnosis not present

## 2023-08-21 NOTE — Progress Notes (Signed)
 Cardiology Office Note:    Date:  08/21/2023   ID:  [name], DOB [birth year], MRN [id number]  PCP:  [name], DO   [facility] Providers Cardiologist:  Ozell Fell, MD     Referring MD: [name], DO   Chief Complaint  Patient presents with   Coronary Artery Disease   Loss of Consciousness    History of Present Illness:    [name] is a 73 y.o. male presenting for follow-up evaluation.  He recently had an episode of syncope and decreased responsiveness at home.  His wife was present and witnessed the event.  The patient was taken by EMS to the hospital after regaining consciousness.  He never required CPR or rescue breaths.  He was found to have a small subarachnoid hemorrhage and his antiplatelet drugs were stopped.  He had been on aspirin  and clopidogrel  for treatment of coronary artery disease. The patient had remote PCI of the right coronary artery left circumflex vessel in 2004 and repeat stenting of the left circumflex in 2015.  He has a history of metastatic lung cancer found in 2004.  Comorbid conditions include infrarenal abdominal aortic aneurysm, hypertension, mixed hyperlipidemia, diabetes, COPD. He had recurrence of lung cancer in 2022 requiring radiation and he is maintained now on Erlotinib  under the care of Dr Sherrod.  The patient was evaluated by neurosurgery who cleared him to start back on aspirin  and clopidogrel  and he is taking both of these again.  He denies any change in chronic exertional dyspnea.  He denies edema, heart palpitations, or recurrent lightheadedness.  He admits to some mild chest discomfort when walking up a hill in the cold weather.  He otherwise has no cardiac-related symptoms.  He has taken 1 nitroglycerin  in the last 6 months.   Current Medications: Current Meds  Medication Sig   ACCU-CHEK AVIVA PLUS test strip 1 each by Other route daily.   atorvastatin  (LIPITOR) 40 MG tablet Take 40 mg by mouth daily.    Cholecalciferol  (VITAMIN D3) 75 MCG (3000 UT) TABS Take 50 mcg by mouth daily. Takes 50 mcg/day   cyanocobalamin  1000 MCG tablet Take 1,000 mcg by mouth daily.   denosumab  (PROLIA ) 60 MG/ML SOSY injection Inject 60 mg into the skin every 6 (six) months.   doxycycline  (MONODOX ) 100 MG capsule Take 1 capsule (100 mg total) by mouth daily.   erlotinib  (TARCEVA ) 100 MG tablet Take 1 tablet (100 mg total) by mouth daily. Take on an empty stomach 1 hour before meals or 2 hours after.   famotidine  (PEPCID ) 40 MG tablet Take 1 tablet (40 mg total) by mouth 2 (two) times daily. Please call [phone] to schedule an office visit for more refills.   glimepiride  (AMARYL ) 2 MG tablet Take 4 mg by mouth every morning.   ipratropium (ATROVENT ) 0.06 % nasal spray Place 2 sprays into both nostrils every 6 (six) hours as needed for rhinitis.   JARDIANCE  25 MG TABS tablet Take 25 mg by mouth daily.   Lancets (FREESTYLE) lancets 1 each by Other route daily.   levocetirizine (XYZAL ) 5 MG tablet Take 1 tablet (5 mg total) by mouth daily as needed (Can take an extra dose during flare ups.).   levothyroxine  (SYNTHROID ) 25 MCG tablet Take 25 mcg by mouth every morning.   losartan  (COZAAR ) 25 MG tablet TAKE 1 TABLET BY MOUTH EVERY DAY FOR 90 DAYS   loteprednol  (LOTEMAX ) 0.5 % ophthalmic suspension Place 1 drop into  both eyes daily.   Magnesium  Glycinate 100 MG CAPS Take 2 capsules Orally nightly   metoprolol  tartrate (LOPRESSOR ) 50 MG tablet TAKE 1 TABLET BY MOUTH TWICE A DAY   nitroGLYCERIN  (NITROSTAT ) 0.4 MG SL tablet Place 1 tablet (0.4 mg total) under the tongue every 5 (five) minutes as needed for chest pain (up to 3 doses).   nystatin-triamcinolone ointment (MYCOLOG) Apply 1 Application topically as needed.   Olopatadine  HCl (PATADAY ) 0.2 % SOLN Place 1 drop into both eyes daily.   Olopatadine -Mometasone  (RYALTRIS ) 665-25 MCG/ACT SUSP Place 2 sprays into the nose in the morning and at bedtime.   Propylene Glycol,  PF, (SYSTANE COMPLETE PF) 0.6 % SOLN Apply 1 drop to eye every 4 (four) hours as needed.   TRADJENTA  5 MG TABS tablet Take 5 mg by mouth daily.   zaleplon  (SONATA ) 10 MG capsule Take 1 capsule (10 mg total) by mouth at bedtime.     Allergies:   Iohexol , Ivp dye [iodinated contrast media], Morphine and codeine, Other, and Povidone iodine   ROS:   Please see the history of present illness.    All other systems reviewed and are negative.  EKGs/Labs/Other Studies Reviewed:    The following studies were reviewed today: Cardiac Studies & Procedures     STRESS TESTS  MYOCARDIAL PERFUSION IMAGING 07/14/2020  Narrative  Nuclear stress EF: 46%. Mild generalized hypokinesis.  There was no ST segment deviation noted during stress.  There is mild reduced uptake noted along the inferior wall distribution seen at both rest and partially at stress. No large territory ischemia identified.  This is an intermediate risk study based upon mildly reduced ejection fraction of 46%.  Oneil Parchment, MD  ECHOCARDIOGRAM  ECHOCARDIOGRAM COMPLETE 12/26/2022  Narrative ECHOCARDIOGRAM REPORT    Patient Name:   [name] Date of Exam: 12/26/2022 Medical Rec #:  [id number]        Height:       73.0 in Accession #:    [id number]       Weight:       195.6 lb Date of Birth:  1951-02-10        BSA:          2.131 m Patient Age:    72 years         BP:           187/93 mmHg Patient Gender: M                HR:           69 bpm. Exam Location:  [address]  Procedure: 2D Echo, 3D Echo, Strain Analysis, Cardiac Doppler and Color Doppler  Indications:    I35.1 Aortic Insifficiency  History:        Patient has prior history of Echocardiogram examinations, most recent 05/30/2017. CAD, AAA, Lung cancer; Risk Factors:Hypertension and HLD.  Sonographer:    Waldo Guadalajara RCS Referring Phys: [id number] [place] [facility]  IMPRESSIONS   1. Left ventricular ejection fraction, by estimation, is 55 to 60%. The  left ventricle has normal function. The left ventricle has no regional wall motion abnormalities. The left ventricular internal cavity size was mildly dilated. Left ventricular diastolic parameters are consistent with Grade I diastolic dysfunction (impaired relaxation). The average left ventricular global longitudinal strain is -18.3 %. The global longitudinal strain is normal. 2. Right ventricular systolic function is normal. The right ventricular size is normal. 3. The mitral valve is normal in structure. No  evidence of mitral valve regurgitation. No evidence of mitral stenosis. 4. The aortic valve is tricuspid. There is moderate calcification of the aortic valve. There is moderate thickening of the aortic valve. Aortic valve regurgitation is moderate. Aortic valve sclerosis/calcification is present, without any evidence of aortic stenosis. 5. Aortic dilatation noted. There is mild dilatation of the aortic root, measuring 38 mm. There is mild dilatation of the ascending aorta, measuring 38 mm. 6. The inferior vena cava is normal in size with greater than 50% respiratory variability, suggesting right atrial pressure of 3 mmHg.  FINDINGS Left Ventricle: Left ventricular ejection fraction, by estimation, is 55 to 60%. The left ventricle has normal function. The left ventricle has no regional wall motion abnormalities. The average left ventricular global longitudinal strain is -18.3 %. The global longitudinal strain is normal. The left ventricular internal cavity size was mildly dilated. There is no left ventricular hypertrophy. Left ventricular diastolic parameters are consistent with Grade I diastolic dysfunction (impaired relaxation).  Right Ventricle: The right ventricular size is normal. No increase in right ventricular wall thickness. Right ventricular systolic function is normal.  Left Atrium: Left atrial size was normal in size.  Right Atrium: Right atrial size was normal in  size.  Pericardium: There is no evidence of pericardial effusion.  Mitral Valve: The mitral valve is normal in structure. No evidence of mitral valve regurgitation. No evidence of mitral valve stenosis.  Tricuspid Valve: The tricuspid valve is normal in structure. Tricuspid valve regurgitation is trivial. No evidence of tricuspid stenosis.  Aortic Valve: The aortic valve is tricuspid. There is moderate calcification of the aortic valve. There is moderate thickening of the aortic valve. Aortic valve regurgitation is moderate. Aortic regurgitation PHT measures 556 msec. Aortic valve sclerosis/calcification is present, without any evidence of aortic stenosis.  Pulmonic Valve: The pulmonic valve was normal in structure. Pulmonic valve regurgitation is not visualized. No evidence of pulmonic stenosis.  Aorta: Aortic dilatation noted. There is mild dilatation of the aortic root, measuring 38 mm. There is mild dilatation of the ascending aorta, measuring 38 mm.  Venous: The inferior vena cava is normal in size with greater than 50% respiratory variability, suggesting right atrial pressure of 3 mmHg.  IAS/Shunts: No atrial level shunt detected by color flow Doppler.   LEFT VENTRICLE PLAX 2D LVIDd:         4.60 cm   Diastology LVIDs:         3.30 cm   LV e' medial:    4.57 cm/s LV PW:         1.20 cm   LV E/e' medial:  9.1 LV IVS:        1.10 cm   LV e' lateral:   7.29 cm/s LVOT diam:     2.00 cm   LV E/e' lateral: 5.7 LV SV:         52 LV SV Index:   25        2D Longitudinal Strain LVOT Area:     3.14 cm  2D Strain GLS (A2C):   -19.3 % 2D Strain GLS (A3C):   -18.3 % 2D Strain GLS (A4C):   -17.2 % 2D Strain GLS Avg:     -18.3 %  3D Volume EF: 3D EF:        51 % LV EDV:       138 ml LV ESV:       68 ml LV SV:        70  ml  RIGHT VENTRICLE RV Basal diam:  2.80 cm RV S prime:     14.40 cm/s TAPSE (M-mode): 2.2 cm  LEFT ATRIUM             Index        RIGHT ATRIUM            Index LA diam:        3.60 cm 1.69 cm/m   RA Area:     10.20 cm LA Vol (A2C):   43.8 ml 20.55 ml/m  RA Volume:   19.30 ml  9.06 ml/m LA Vol (A4C):   28.3 ml 13.28 ml/m LA Biplane Vol: 37.9 ml 17.78 ml/m AORTIC VALVE LVOT Vmax:   73.40 cm/s LVOT Vmean:  54.800 cm/s LVOT VTI:    0.167 m AI PHT:      556 msec  AORTA Ao Root diam: 3.80 cm Ao Asc diam:  3.80 cm  MITRAL VALVE MV Area (PHT):             SHUNTS MV Decel Time:             Systemic VTI:  0.17 m MV E velocity: 41.40 cm/s  Systemic Diam: 2.00 cm MV A velocity: 84.90 cm/s MV E/A ratio:  0.49  Maude Emmer MD Electronically signed by Maude Emmer MD Signature Date/Time: 12/26/2022/5:06:44 PM    Final             EKG:        Recent Labs: 08/12/2023: TSH 1.567 08/14/2023: ALT 23; BUN 11; Creatinine, Ser 1.36; Hemoglobin 15.3; Magnesium  1.6; Platelets 174; Potassium 5.0; Sodium 139  Recent Lipid Panel No results found for: CHOL, TRIG, HDL, CHOLHDL, VLDL, LDLCALC, LDLDIRECT   Risk Assessment/Calculations:                Physical Exam:    VS:  BP 130/74   Pulse 69   Ht 6' (1.829 m)   Wt 195 lb 3.2 oz (88.5 kg)   SpO2 97%   BMI 26.47 kg/m     Wt Readings from Last 3 Encounters:  08/21/23 195 lb 3.2 oz (88.5 kg)  08/14/23 194 lb 10.7 oz (88.3 kg)  05/22/23 194 lb 12.8 oz (88.4 kg)     GEN:  Well nourished, well developed in no acute distress HEENT: Normal NECK: No JVD; No carotid bruits LYMPHATICS: No lymphadenopathy CARDIAC: RRR, no murmurs, rubs, gallops RESPIRATORY:  Clear to auscultation without rales, wheezing or rhonchi  ABDOMEN: Soft, non-tender, non-distended MUSCULOSKELETAL:  No edema; No deformity  SKIN: Warm and dry NEUROLOGIC:  Alert and oriented x 3 PSYCHIATRIC:  Normal affect   Assessment & Plan Coronary artery disease involving native coronary artery of native heart with angina pectoris (HCC) I think he should stay off of isosorbide .  This was discontinued  during his hospitalization.  His LV function has been normal.  If he has progressive angina we may need to start him back on a lower dose.  I asked him to stay on aspirin  81 mg daily.  I think he can discontinue clopidogrel .  His last PCI procedure was 2015.  He has had no recurrent ischemic events.  We discussed that in assessing need for long-term DAPT, we weighed the risks and potential benefits.  Now that he has had a small subarachnoid hemorrhage after a fall, it is probably best to keep him off of clopidogrel . Mixed hyperlipidemia Treated with high intensity statin drug using atorvastatin  40 mg daily.  Last  lipids with an LDL of 55.  Continue current management. Infrarenal abdominal aortic aneurysm (AAA) without rupture [facility] Abdominal aortic ultrasound March 2024 shows a 3.9 cm aneurysm, stable from previous studies.  Continue annual surveillance. Essential hypertension Treated with low-dose losartan  and metoprolol .  Continue current management.  If he has further evidence of orthostasis or episodes of syncope, we will need to discontinue losartan  as a next step.  Stressed the importance of adequate fluid hydration. Syncope and collapse Discussed the importance of adequate fluid hydration.  Isosorbide  stopped.  Otherwise continue current medical program.  Last echo in May 2024 shows normal LVEF of 55 to 60%.            Medication Adjustments/Labs and Tests Ordered: Current medicines are reviewed at length with the patient today.  Concerns regarding medicines are outlined above.  No orders of the defined types were placed in this encounter.  No orders of the defined types were placed in this encounter.   Patient Instructions  Medication Instructions:  Stop Clopidogrel  (Plavix ) Stop Isosorbide   *If you need a refill on your cardiac medications before your next appointment, please call your pharmacy*   Follow-Up: At [facility], you and your health needs are our  priority.  As part of our continuing mission to provide you with exceptional heart care, we have created designated Provider Care Teams.  These Care Teams include your primary Cardiologist (physician) and Advanced Practice Providers (APPs -  Physician Assistants and Nurse Practitioners) who all work together to provide you with the care you need, when you need it.  We recommend signing up for the patient portal called MyChart.  Sign up information is provided on this After Visit Summary.  MyChart is used to connect with patients for Virtual Visits (Telemedicine).  Patients are able to view lab/test results, encounter notes, upcoming appointments, etc.  Non-urgent messages can be sent to your provider as well.   To learn more about what you can do with MyChart, go to [email or web address].    Your next appointment:   1 year(s)  Provider:   Ozell Fell, MD       Signed, Ozell Fell, MD  08/21/2023 2:11 PM    [facility]

## 2023-08-21 NOTE — Patient Instructions (Signed)
 Medication Instructions:  Stop Clopidogrel  (Plavix ) Stop Isosorbide   *If you need a refill on your cardiac medications before your next appointment, please call your pharmacy*   Follow-Up: At [facility], you and your health needs are our priority.  As part of our continuing mission to provide you with exceptional heart care, we have created designated Provider Care Teams.  These Care Teams include your primary Cardiologist (physician) and Advanced Practice Providers (APPs -  Physician Assistants and Nurse Practitioners) who all work together to provide you with the care you need, when you need it.  We recommend signing up for the patient portal called MyChart.  Sign up information is provided on this After Visit Summary.  MyChart is used to connect with patients for Virtual Visits (Telemedicine).  Patients are able to view lab/test results, encounter notes, upcoming appointments, etc.  Non-urgent messages can be sent to your provider as well.   To learn more about what you can do with MyChart, go to [email or web address].    Your next appointment:   1 year(s)  Provider:   Ozell Fell, MD

## 2023-08-21 NOTE — Assessment & Plan Note (Signed)
 Abdominal aortic ultrasound March 2024 shows a 3.9 cm aneurysm, stable from previous studies.  Continue annual surveillance.

## 2023-08-21 NOTE — Assessment & Plan Note (Signed)
 Discussed the importance of adequate fluid hydration.  Isosorbide stopped.  Otherwise continue current medical program.  Last echo in May 2024 shows normal LVEF of 55 to 60%.

## 2023-08-21 NOTE — Assessment & Plan Note (Signed)
 Treated with high intensity statin drug using atorvastatin 40 mg daily.  Last lipids with an LDL of 55.  Continue current management.

## 2023-08-21 NOTE — Assessment & Plan Note (Signed)
 Treated with low-dose losartan and metoprolol.  Continue current management.  If he has further evidence of orthostasis or episodes of syncope, we will need to discontinue losartan as a next step.  Stressed the importance of adequate fluid hydration.

## 2023-08-27 ENCOUNTER — Inpatient Hospital Stay: Payer: Medicare Other | Attending: Physician Assistant | Admitting: Internal Medicine

## 2023-08-27 VITALS — BP 124/69 | HR 75 | Temp 97.6°F | Resp 16 | Ht 72.0 in | Wt 193.1 lb

## 2023-08-27 DIAGNOSIS — Z79899 Other long term (current) drug therapy: Secondary | ICD-10-CM | POA: Diagnosis not present

## 2023-08-27 DIAGNOSIS — C3412 Malignant neoplasm of upper lobe, left bronchus or lung: Secondary | ICD-10-CM

## 2023-08-27 NOTE — Progress Notes (Signed)
 [facility] [phone]   [phone]  OFFICE PROGRESS NOTE  [name], DO [address] [place] [address]  DIAGNOSIS: Metastatic non-small cell lung cancer, adenocarcinoma initially diagnosed in December of 2004  PRIOR THERAPY: 1) status post course of concurrent chemoradiation under the care of Dr. Melodye. 2) status post left upper lobe wedge resection on 01/10/2005 for recurrent disease in the left lung.  CURRENT THERAPY: Erlotinib  150 mg by mouth daily started in 2006.  Starting August 2022 the patient will be on erlotinib  100 mg p.o. daily.  Status post 28 months of treatment.  INTERVAL HISTORY:  DUTCH ING II 73 y.o. male returns to the clinic today for follow-up visit accompanied by his wife.Discussed the use of AI scribe software for clinical note transcription with the patient, who gave verbal consent to proceed.  History of Present Illness   The patient, a 73 year old with a history of metastatic adenocarcinoma of the lung diagnosed in December 2004, has been on treatment with Tarceva  since 2006 and switched to the generic form, Erlotinib , 100mg  for the past 28 months. He reports a recent episode of syncope, which occurred while getting up early in the morning. The last memory before losing consciousness was setting a glass of iced tea on the chest of drawers. He woke up in the back of an ambulance en route to the emergency room. Following this episode, he was taken off Plavix  and Isosorbide , and is currently on a daily aspirin  regimen. He believes the syncope was due to a drop in blood pressure.  Post-syncope, he has been experiencing a persistent headache, which he was informed to expect for a few weeks. He denies any chest pain, shortness of breath, nausea, vomiting, or diarrhea. He also denies any rash, a previous side effect from the Erlotinib . He reports no new symptoms related to his lung cancer, and his last scan in November  showed stable disease.  He has also been started on magnesium , which has been causing some stomach problems. He has learned to stand slowly after sitting or lying down to prevent further syncopal episodes.       MEDICAL HISTORY: Past Medical History:  Diagnosis Date   AAA (abdominal aortic aneurysm) (HCC)    a. 3.4 x 3.5 on scan 02/28/13.   Allergic reaction to contrast dye    Atherosclerosis    CAD (coronary artery disease)    a. stenting of RCA/LCx 2004. b. [place]  08/2013:  s/p DES to LCx for severe ISR.   Diabetes mellitus    Diverticulosis    Emphysema lung (HCC)    Emphysema of lung (HCC)    Encounter for therapeutic drug monitoring 06/06/2016   GERD (gastroesophageal reflux disease)    Hemorrhoids    Hepatic steatosis    History of radiation therapy 09/07/20-09/22/20   SBRT left lung ; Dr Lynwood Nasuti   HTN (hypertension)    Hyperlipidemia    Kidney stone    Lung cancer [facility]) dx'd 2004   chemo/xrt comp; tarceva  ongoing- January 2022 radiation   Stroke [facility])     ALLERGIES:  is allergic to iohexol , ivp dye [iodinated contrast media], morphine and codeine, other, and povidone iodine.  MEDICATIONS:  Current Outpatient Medications  Medication Sig Dispense Refill   ACCU-CHEK AVIVA PLUS test strip 1 each by Other route daily.     atorvastatin  (LIPITOR) 40 MG tablet Take 40 mg by mouth daily.     Cholecalciferol  (VITAMIN D3)  75 MCG (3000 UT) TABS Take 50 mcg by mouth daily. Takes 50 mcg/day     cyanocobalamin  1000 MCG tablet Take 1,000 mcg by mouth daily.     denosumab  (PROLIA ) 60 MG/ML SOSY injection Inject 60 mg into the skin every 6 (six) months.     doxycycline  (MONODOX ) 100 MG capsule Take 1 capsule (100 mg total) by mouth daily. 30 capsule 5   erlotinib  (TARCEVA ) 100 MG tablet Take 1 tablet (100 mg total) by mouth daily. Take on an empty stomach 1 hour before meals or 2 hours after. 90 tablet 1   famotidine  (PEPCID ) 40 MG tablet Take 1 tablet (40 mg total) by mouth 2 (two)  times daily. Please call [phone] to schedule an office visit for more refills. 180 tablet 2   glimepiride  (AMARYL ) 2 MG tablet Take 4 mg by mouth every morning.     ipratropium (ATROVENT ) 0.06 % nasal spray Place 2 sprays into both nostrils every 6 (six) hours as needed for rhinitis. 15 mL 5   JARDIANCE  25 MG TABS tablet Take 25 mg by mouth daily.     Lancets (FREESTYLE) lancets 1 each by Other route daily.     levocetirizine (XYZAL ) 5 MG tablet Take 1 tablet (5 mg total) by mouth daily as needed (Can take an extra dose during flare ups.). 180 tablet 1   levothyroxine  (SYNTHROID ) 25 MCG tablet Take 25 mcg by mouth every morning.     losartan  (COZAAR ) 25 MG tablet TAKE 1 TABLET BY MOUTH EVERY DAY FOR 90 DAYS     loteprednol  (LOTEMAX ) 0.5 % ophthalmic suspension Place 1 drop into both eyes daily. 15 mL 5   Magnesium  Glycinate 100 MG CAPS Take 2 capsules Orally nightly     metoprolol  tartrate (LOPRESSOR ) 50 MG tablet TAKE 1 TABLET BY MOUTH TWICE A DAY 180 tablet 2   nitroGLYCERIN  (NITROSTAT ) 0.4 MG SL tablet Place 1 tablet (0.4 mg total) under the tongue every 5 (five) minutes as needed for chest pain (up to 3 doses). 25 tablet 3   nystatin-triamcinolone ointment (MYCOLOG) Apply 1 Application topically as needed.     Olopatadine  HCl (PATADAY ) 0.2 % SOLN Place 1 drop into both eyes daily. 8 mL 1   Olopatadine -Mometasone  (RYALTRIS ) 665-25 MCG/ACT SUSP Place 2 sprays into the nose in the morning and at bedtime. 30 g 5   Propylene Glycol, PF, (SYSTANE COMPLETE PF) 0.6 % SOLN Apply 1 drop to eye every 4 (four) hours as needed. 30 mL 1   TRADJENTA  5 MG TABS tablet Take 5 mg by mouth daily.     zaleplon  (SONATA ) 10 MG capsule Take 1 capsule (10 mg total) by mouth at bedtime.     No current facility-administered medications for this visit.    REVIEW OF SYSTEMS:  Constitutional: positive for fatigue Eyes: negative Ears, nose, mouth, throat, and face: negative Respiratory: negative Cardiovascular:  negative Gastrointestinal: negative Genitourinary:negative Integument/breast: negative Hematologic/lymphatic: negative Musculoskeletal:negative Neurological: positive for headaches Behavioral/Psych: negative Endocrine: negative Allergic/Immunologic: negative   PHYSICAL EXAMINATION: General appearance: alert, cooperative, and no distress Head: Normocephalic, without obvious abnormality, atraumatic Neck: no adenopathy Lymph nodes: Cervical, supraclavicular, and axillary nodes normal. Resp: clear to auscultation bilaterally Back: symmetric, no curvature. ROM normal. No CVA tenderness. Cardio: regular rate and rhythm, S1, S2 normal, no murmur, click, rub or gallop GI: soft, non-tender; bowel sounds normal; no masses,  no organomegaly Extremities: extremities normal, atraumatic, no cyanosis or edema Neurologic: Alert and oriented X 3, normal strength and  tone. Normal symmetric reflexes. Normal coordination and gait    ECOG PERFORMANCE STATUS: 1 - Symptomatic but completely ambulatory  Blood pressure 124/69, pulse 75, temperature 97.6 F (36.4 C), temperature source Temporal, resp. rate 16, height 6' (1.829 m), weight 193 lb 1.6 oz (87.6 kg), SpO2 99%.  LABORATORY DATA: Lab Results  Component Value Date   WBC 9.8 08/14/2023   HGB 15.3 08/14/2023   HCT 42.4 08/14/2023   MCV 92.4 08/14/2023   PLT 174 08/14/2023      Chemistry      Component Value Date/Time   NA 139 08/14/2023 0456   NA 139 06/04/2017 0948   K 5.0 08/14/2023 0456   K 5.1 06/04/2017 0948   CL 112 (H) 08/14/2023 0456   CL 106 06/10/2012 0900   CO2 22 08/14/2023 0456   CO2 26 06/04/2017 0948   BUN 11 08/14/2023 0456   BUN 14.2 06/04/2017 0948   CREATININE 1.36 (H) 08/14/2023 0456   CREATININE 1.57 (H) 07/11/2023 0844   CREATININE 1.5 (H) 06/04/2017 0948      Component Value Date/Time   CALCIUM  8.0 (L) 08/14/2023 0456   CALCIUM  9.6 06/04/2017 0948   ALKPHOS 66 08/14/2023 0456   ALKPHOS 84 06/04/2017 0948    AST 23 08/14/2023 0456   AST 17 07/11/2023 0844   AST 24 06/04/2017 0948   ALT 23 08/14/2023 0456   ALT 19 07/11/2023 0844   ALT 35 06/04/2017 0948   BILITOT 1.4 (H) 08/14/2023 0456   BILITOT 1.2 (H) 07/11/2023 0844   BILITOT 0.82 06/04/2017 0948       RADIOGRAPHIC STUDIES: CT HEAD WO CONTRAST ( ) Result Date: 08/13/2023 CLINICAL DATA:  Provided history: Subarachnoid hemorrhage. EXAM: CT HEAD WITHOUT CONTRAST TECHNIQUE: Contiguous axial images were obtained from the base of the skull through the vertex without intravenous contrast. RADIATION DOSE REDUCTION: This exam was performed according to the departmental dose-optimization program which includes automated exposure control, adjustment of the mA and/or kV according to patient size and/or use of iterative reconstruction technique. COMPARISON:  Brain MRI 08/12/2023.  Head CT 08/12/2023. FINDINGS: Brain: Mild generalized cerebral volume loss. Small-to-moderate volume acute subarachnoid hemorrhage again noted along the bilateral cerebral hemispheres (greatest along the frontoparietal convexities), not significantly changed from the prior head CT and brain MRI examinations of 08/12/2023. No demarcated cortical infarct. No evidence of an intracranial mass. No midline shift or hydrocephalus. Vascular: No hyperdense vessel.  Atherosclerotic calcifications. Skull: No calvarial fracture or aggressive osseous lesion. Sinuses/Orbits: No mass or acute finding within the imaged orbits. No significant paranasal sinus disease at the imaged levels. IMPRESSION: 1. Small-to-moderate volume acute subarachnoid hemorrhage again noted along the bilateral cerebral hemispheres (greatest along the frontoparietal convexities), not significantly changed from the prior head CT and brain MRI examinations of 08/12/2023. 2. No evidence of an interval acute intracranial abnormality. Electronically Signed   By: Rockey Childs D.O.   On: 08/13/2023 07:27   MR BRAIN WO  CONTRAST Result Date: 08/12/2023 CLINICAL DATA:  73 year old male with syncope, code stroke presentation this morning with subarachnoid hemorrhage. EXAM: MRI HEAD WITHOUT CONTRAST TECHNIQUE: Multiplanar, multiecho pulse sequences of the brain and surrounding structures were obtained without intravenous contrast. COMPARISON:  Head CT 0712 hours today. FINDINGS: Brain: Scattered subarachnoid susceptibility on DWI, most pronounced at the vertex, compatible with known blood products. No restricted diffusion or evidence of acute infarction. No midline shift, mass effect, or evidence of intracranial mass lesion. No ventriculomegaly. No intraventricular hemorrhage. Scattered and layering subarachnoid  blood as seen by head CT, most apparent at the bilateral vertex on FLAIR and SWI imaging. No subdural or extradural hemorrhage or collection. No convincing underlying chronic microhemorrhage or superficial siderosis. And otherwise largely normal for age gray and white matter signal throughout the brain. No definite cortical encephalomalacia. Vascular: Major intracranial vascular flow voids are preserved. Skull and upper cervical spine: Negative visible cervical spine. Visualized bone marrow signal is within normal limits. Sinuses/Orbits: Postoperative changes to both globes. Fluid, mucosal thickening again noted in the maxillary sinuses. Other: Mastoids are well aerated. Grossly normal visible internal auditory structures. Negative visible scalp and face. IMPRESSION: 1. Stable small volume bilateral Subarachnoid Hemorrhage, pattern unchanged from earlier Head CT. No IVH, ventriculomegaly, or other complicating features. 2. No evidence of acute infarct, and otherwise largely normal for age noncontrast MRI appearance of the Brain. Electronically Signed   By: VEAR Hurst M.D.   On: 08/12/2023 11:09   CT HEAD CODE STROKE WO CONTRAST Result Date: 08/12/2023 CLINICAL DATA:  Code stroke.  73 year old male neurologic deficit. EXAM:  CT HEAD WITHOUT CONTRAST TECHNIQUE: Contiguous axial images were obtained from the base of the skull through the vertex without intravenous contrast. RADIATION DOSE REDUCTION: This exam was performed according to the departmental dose-optimization program which includes automated exposure control, adjustment of the mA and/or kV according to patient size and/or use of iterative reconstruction technique. COMPARISON:  Head CT 02/11/2007. FINDINGS: Brain: Bilateral subarachnoid hemorrhage. Scattered bilateral hyperdense blood in the cerebral sulci which is most apparent at the bilateral vertex although there is anterior frontal convexity involvement near the anterior cranial fossa (series 2, image 15). There is occipital lobe sulcal involvement more pronounced on the right. However, the bilateral sylvian fissures and the bilateral basilar cisterns are relatively spared. No intraventricular hemorrhage or ventriculomegaly identified. No midline shift or intracranial mass effect. No convincing subdural blood. No cortically based acute infarct identified. Mild for age patchy bilateral white matter hypodensity. Vascular: Calcified atherosclerosis at the skull base. No suspicious intracranial vascular hyperdensity. Skull: Intact.  No acute osseous abnormality identified. Sinuses/Orbits: Maxillary sinus mucoperiosteal thickening with small new fluid level since 2008. Other Visualized paranasal sinuses and mastoids are stable and well aerated. Other: Postoperative changes to the globes since 2008. No gaze deviation. Visualized scalp soft tissues are within normal limits. ASPECTS [facility] Stroke Program Early CT Score) Total score (0-10 with 10 being normal): Not applicable, acute subarachnoid hemorrhage. IMPRESSION: 1. Positive for bilateral Acute Subarachnoid Hemorrhage. Small to moderate volume relatively sparing the basilar cisterns and sylvian fissures, more pronounced over the convexities, a pattern most resembling  sequelae of trauma. Coagulopathy might appear similar. 2. No intracranial mass effect, midline shift, acute cortically based infarct. No IVH or ventriculomegaly. 3. No skull fracture or scalp hematoma identified. These results were communicated to Dr. Lindzen at 7:20 am on 08/12/2023 by text page via the [facility] messaging system. Electronically Signed   By: VEAR Hurst M.D.   On: 08/12/2023 07:21     ASSESSMENT/PLAN:  This is a very pleasant 73 years old white male with metastatic non-small cell lung cancer, adenocarcinoma with positive EGFR mutation and he is currently on treatment with Tarceva  since 2006.  This was a switch of 8 months ago to the generic Erlotinib  and he is tolerating it well except for few episodes of diarrhea and mild skin rash.  Since the switch to the generic Erlotinib , the patient has been complaining of worsening rash conjunctivitis as well as diarrhea that already improved.  He also underwent SBRT to the enlarging left upper lobe lung nodule. His treatment was switched to erlotinib  100 mg p.o. twice daily 28 months ago. The patient has been tolerating this treatment fairly well. He had repeat CT scan of the chest performed in November 2024 that showed no concerning findings for disease progression.    Metastatic Adenocarcinoma of the Lung Diagnosed in December 2004. Underwent concurrent chemoradiation initially and has been on erlotinib  100 mg daily for 28 months. Recent chest scan on July 11, 2023, showed well-managed disease with no new growth. No current symptoms of chest pain, dyspnea, or rash. No gastrointestinal side effects from erlotinib . Discussed continuation of erlotinib  given stable disease and no significant side effects. Patient prefers to continue current treatment regimen. - Continue erlotinib  100 mg daily - Follow-up in three months with repeat blood work  Suspicious Stroke Experienced a stroke in December 2024, resulting in a syncopal episode. Underwent CT  head and brain MRI. Currently experiencing residual headache, expected to last a few weeks. Blood pressure management adjusted by discontinuing Plavix  and isosorbide , now on aspirin  81 mg daily. Discussed importance of monitoring blood pressure to prevent future episodes. - Monitor headache symptoms - Continue aspirin  81 mg daily  Magnesium  Supplementation Side Effects Started on magnesium  supplementation, experiencing gastrointestinal side effects including upset stomach and diarrhea. Discussed potential for magnesium  to cause gastrointestinal upset and need to monitor symptoms. - Monitor gastrointestinal symptoms  General Health Maintenance  - None  Follow-up - Schedule follow-up appointment in three months with repeat blood work.   The patient was advised to call immediately if he has any other concerning symptoms in the interval. All questions were answered. The patient knows to call the clinic with any problems, questions or concerns. We can certainly see the patient much sooner if necessary.  Disclaimer: This note was dictated with voice recognition software. Similar sounding words can inadvertently be transcribed and may not be corrected upon review.

## 2023-10-18 ENCOUNTER — Other Ambulatory Visit: Payer: Self-pay | Admitting: Internal Medicine

## 2023-10-18 DIAGNOSIS — C3490 Malignant neoplasm of unspecified part of unspecified bronchus or lung: Secondary | ICD-10-CM

## 2023-10-29 ENCOUNTER — Other Ambulatory Visit: Payer: Self-pay | Admitting: Internal Medicine

## 2023-10-29 ENCOUNTER — Other Ambulatory Visit: Payer: Self-pay | Admitting: Cardiovascular Disease

## 2023-11-20 ENCOUNTER — Encounter: Payer: Self-pay | Admitting: Allergy and Immunology

## 2023-11-20 ENCOUNTER — Other Ambulatory Visit: Payer: Self-pay | Admitting: Allergy and Immunology

## 2023-11-20 ENCOUNTER — Other Ambulatory Visit: Payer: Self-pay

## 2023-11-20 ENCOUNTER — Other Ambulatory Visit: Payer: Self-pay | Admitting: Internal Medicine

## 2023-11-20 ENCOUNTER — Ambulatory Visit: Payer: Medicare Other | Admitting: Allergy and Immunology

## 2023-11-20 VITALS — BP 120/62 | HR 71 | Temp 97.6°F | Resp 16 | Ht 73.0 in | Wt 191.1 lb

## 2023-11-20 DIAGNOSIS — R442 Other hallucinations: Secondary | ICD-10-CM | POA: Diagnosis not present

## 2023-11-20 DIAGNOSIS — J3089 Other allergic rhinitis: Secondary | ICD-10-CM | POA: Diagnosis not present

## 2023-11-20 DIAGNOSIS — H10503 Unspecified blepharoconjunctivitis, bilateral: Secondary | ICD-10-CM

## 2023-11-20 NOTE — Progress Notes (Unsigned)
 [place] - [place] - [place] - [place] - [place]   Follow-up Note  Referring Provider: Charlane Ferretti, DO Primary Provider: Charlane Ferretti, DO Date of Office Visit: 11/20/2023  Subjective:   [name] (DOB: 14-Oct-1950) is a 73 y.o. male who [facility] in re-evaluation of the following:  HPI: [name] returns to this clinic in evaluation of allergic rhinoconjunctivitis, blepharoconjunctivitis, history of phantosmia.  I last saw him in this clinic 22 May 2023.  Overall he is doing very well with his eyes while using his Lotemax about 1 or 2 times per week along with his doxycycline and Pataday.  He has had some occasional nasal congestion although his phantosmia has not returned to the extent that it was existing in the past.  He is only using his nasal steroid/antihistamine combination spray once or twice a week.  He has not required a systemic steroid or an antibiotic for any type of airway issue.  Allergies as of 11/20/2023       Reactions   Iohexol Other (See Comments)    Code: HIVES, Desc: pt recieves 50mg  benadryl 1 hour prior to scan   Ivp Dye [iodinated Contrast Media] Rash   Morphine And Codeine Anxiety   hallucinations   Other Rash   chlorascrub when starting IV   Povidone Iodine Rash   Other reaction(s): heated rash        Medication List    Accu-Chek Aviva Plus test strip Generic drug: glucose blood 1 each by Other route daily.   atorvastatin 40 MG tablet Commonly known as: LIPITOR Take 40 mg by mouth daily.   cyanocobalamin 1000 MCG tablet Take 1,000 mcg by mouth daily.   doxycycline 100 MG capsule Commonly known as: MONODOX Take 1 capsule (100 mg total) by mouth daily.   erlotinib 100 MG tablet Commonly known as: TARCEVA Take 1 tablet by mouth once daily on an empty stomach, 1 hour before meals or 2 hours after.   famotidine 40 MG tablet Commonly known as: PEPCID TAKE 1 TABLET BY MOUTH  2 TIMES DAILY. PLEASE CALL [phone] SCHEDULE AN OFFICE VISIT FOR REFILLS.   freestyle lancets 1 each by Other route daily.   glimepiride 2 MG tablet Commonly known as: AMARYL Take 4 mg by mouth every morning.   ipratropium 0.06 % nasal spray Commonly known as: ATROVENT Place 2 sprays into both nostrils every 6 (six) hours as needed for rhinitis.   Jardiance 25 MG Tabs tablet Generic drug: empagliflozin Take 25 mg by mouth daily.   levocetirizine 5 MG tablet Commonly known as: XYZAL Take 1 tablet (5 mg total) by mouth daily as needed (Can take an extra dose during flare ups.).   levothyroxine 25 MCG tablet Commonly known as: SYNTHROID Take 25 mcg by mouth every morning.   losartan 25 MG tablet Commonly known as: COZAAR TAKE 1 TABLET BY MOUTH EVERY DAY FOR 90 DAYS   loteprednol 0.5 % ophthalmic suspension Commonly known as: Lotemax Place 1 drop into both eyes daily.   Magnesium Glycinate 100 MG Caps Take 2 capsules Orally nightly   metoprolol tartrate 50 MG tablet Commonly known as: LOPRESSOR TAKE 1 TABLET BY MOUTH TWICE A DAY   nitroGLYCERIN 0.4 MG SL tablet Commonly known as: Nitrostat Place 1 tablet (0.4 mg total) under the tongue every 5 (five) minutes as needed for chest pain (up to 3 doses).   nystatin-triamcinolone ointment Commonly known as: MYCOLOG Apply 1 Application  topically as needed.   Olopatadine HCl 0.2 % Soln Commonly known as: Pataday Place 1 drop into both eyes daily.   Prolia 60 MG/ML Sosy injection Generic drug: denosumab Inject 60 mg into the skin every 6 (six) months.   Ryaltris 161-09 MCG/ACT Susp Generic drug: Olopatadine-Mometasone Place 2 sprays into the nose in the morning and at bedtime.   Systane Complete PF 0.6 % Soln Generic drug: Propylene Glycol (PF) Apply 1 drop to eye every 4 (four) hours as needed.   Tradjenta 5 MG Tabs tablet Generic drug: linagliptin Take 5 mg by mouth daily.   Vitamin D3 75 MCG (3000 UT)  Tabs Take 50 mcg by mouth daily. Takes 50 mcg/day   zaleplon 10 MG capsule Commonly known as: SONATA Take 1 capsule (10 mg total) by mouth at bedtime.    Past Medical History:  Diagnosis Date   AAA (abdominal aortic aneurysm) (HCC)    a. 3.4 x 3.5 on scan 02/28/13.   Allergic reaction to contrast dye    Atherosclerosis    CAD (coronary artery disease)    a. stenting of RCA/LCx 2004. b. [place] 08/2013:  s/p DES to LCx for severe ISR.   Diabetes mellitus    Diverticulosis    Emphysema lung (HCC)    Emphysema of lung (HCC)    Encounter for therapeutic drug monitoring 06/06/2016   GERD (gastroesophageal reflux disease)    Hemorrhoids    Hepatic steatosis    History of radiation therapy 09/07/20-09/22/20   SBRT left lung ; Dr Antony Blackbird   HTN (hypertension)    Hyperlipidemia    Kidney stone    Lung cancer [facility]) dx'd 2004   chemo/xrt comp; tarceva ongoing- January 2022 radiation   Stroke [facility])     Past Surgical History:  Procedure Laterality Date   ANGIOPLASTY / STENTING FEMORAL  08/14/2013   2004-groin   CATARACT EXTRACTION     COLONOSCOPY     LEFT HEART CATHETERIZATION WITH CORONARY ANGIOGRAM N/A 09/08/2013   Procedure: LEFT HEART CATHETERIZATION WITH CORONARY ANGIOGRAM;  Surgeon: Micheline Chapman, MD;  Location: [facility] CATH LAB;  Service: Cardiovascular;  Laterality: N/A;   left partial lobectomy     WISDOM TOOTH EXTRACTION      Review of systems negative except as noted in HPI / PMHx or noted below:  Review of Systems  Constitutional: Negative.   HENT: Negative.    Eyes: Negative.   Respiratory: Negative.    Cardiovascular: Negative.   Gastrointestinal: Negative.   Genitourinary: Negative.   Musculoskeletal: Negative.   Skin: Negative.   Neurological: Negative.   Endo/Heme/Allergies: Negative.   Psychiatric/Behavioral: Negative.       Objective:   Vitals:   11/20/23 1333  BP: 120/62  Pulse: 71  Resp: 16  Temp: 97.6 F (36.4 C)  SpO2: 97%   Height: 6'  1" (185.4 cm)  Weight: 191 lb 1.6 oz (86.7 kg)   Physical Exam Constitutional:      Appearance: He is not diaphoretic.  HENT:     Head: Normocephalic.     Right Ear: Tympanic membrane, ear canal and external ear normal.     Left Ear: Tympanic membrane, ear canal and external ear normal.     Nose: Nose normal. No mucosal edema or rhinorrhea.     Mouth/Throat:     Pharynx: Uvula midline. No oropharyngeal exudate.  Eyes:     Conjunctiva/sclera: Conjunctivae normal.  Neck:     Thyroid: No thyromegaly.  Trachea: Trachea normal. No tracheal tenderness or tracheal deviation.  Cardiovascular:     Rate and Rhythm: Normal rate and regular rhythm.     Heart sounds: Normal heart sounds, S1 normal and S2 normal. No murmur heard. Pulmonary:     Effort: No respiratory distress.     Breath sounds: Normal breath sounds. No stridor. No wheezing or rales.  Lymphadenopathy:     Head:     Right side of head: No tonsillar adenopathy.     Left side of head: No tonsillar adenopathy.     Cervical: No cervical adenopathy.  Skin:    Findings: No erythema or rash.     Nails: There is no clubbing.  Neurological:     Mental Status: He is alert.     Diagnostics: none  Assessment and Plan:   1. Perennial allergic rhinitis   2. Blepharoconjunctivitis of both eyes, unspecified blepharoconjunctivitis type   3. Phantosmia    1.  Continue Ryaltris - 2 sprays each nostril 3-7 times per week  2.  Continue Doxycycline 100 mg - 1 tablet 1 time per day  3.  If needed:   A. OTC antihistamine  B. OTC Systane eye drops   C. Pataday - 1 drop each eye 1 time per day  D. Lotemax - 1 drop each eye 1 time per day  E. Ipratropium 0.06% - 2 sprays each nostril every 6 hours to dry nose  4. Return in 6 months or earlier if problem  5. Influenza = Tamiflu. Covid = Paxlovid  [name] appears to be doing very well regarding his eye inflammatory condition on his current plan and he will remain on doxycycline every  day and he can use his Pataday and Lotemax as needed.  His upper airway would probably benefit from a little more frequent use of his combination nasal steroid/antihistamine and he can increase this from 1 or 2 times per week to 3-7 times per week.  Assuming he does well with this plan I will see him back in this clinic in 6 months or earlier if there is a problem.   Laurette Schimke, MD Allergy / Immunology [facility]

## 2023-11-20 NOTE — Patient Instructions (Addendum)
  1.  Continue Ryaltris - 2 sprays each nostril 3-7 times per week  2.  Continue Doxycycline 100 mg - 1 tablet 1 time per day  3.  If needed:   A. OTC antihistamine  B. OTC Systane eye drops   C. Pataday - 1 drop each eye 1 time per day  D. Lotemax - 1 drop each eye 1 time per day  E. Ipratropium 0.06% - 2 sprays each nostril every 6 hours to dry nose  4. Return in 6 months or earlier if problem  5. Influenza = Tamiflu. Covid = Paxlovid

## 2023-11-21 ENCOUNTER — Other Ambulatory Visit: Payer: Self-pay

## 2023-11-21 ENCOUNTER — Encounter: Payer: Self-pay | Admitting: Allergy and Immunology

## 2023-11-21 MED ORDER — DOXYCYCLINE MONOHYDRATE 100 MG PO CAPS: 100.0000 mg | ORAL_CAPSULE | Freq: Every day | ORAL | 11 refills | Status: DC

## 2023-11-21 MED ORDER — IPRATROPIUM BROMIDE 0.06 % NA SOLN: 2.0000 | Freq: Four times a day (QID) | NASAL | 1 refills | Status: DC | PRN

## 2023-11-21 MED ORDER — LEVOCETIRIZINE DIHYDROCHLORIDE 5 MG PO TABS: 5.0000 mg | ORAL_TABLET | Freq: Every day | ORAL | 3 refills | Status: AC | PRN

## 2023-11-22 ENCOUNTER — Telehealth: Payer: Self-pay | Admitting: Allergy and Immunology

## 2023-11-22 MED ORDER — RYALTRIS 665-25 MCG/ACT NA SUSP: 2.0000 | Freq: Two times a day (BID) | NASAL | 5 refills | Status: DC

## 2023-11-22 NOTE — Addendum Note (Signed)
 Addended by: Floydene Flock C on: 11/22/2023 05:08 PM   Modules accepted: Orders

## 2023-11-22 NOTE — Telephone Encounter (Signed)
 Rx Sent

## 2023-11-22 NOTE — Telephone Encounter (Signed)
 Patient is requesting a refill on his Ryaltris and wants that sent over to [facility] in [place].  Best Contact: [phone]

## 2023-11-27 ENCOUNTER — Telehealth: Payer: Self-pay | Admitting: Physician Assistant

## 2023-11-27 ENCOUNTER — Inpatient Hospital Stay: Payer: Medicare Other

## 2023-11-27 ENCOUNTER — Inpatient Hospital Stay: Payer: Medicare Other | Admitting: Internal Medicine

## 2023-11-27 NOTE — Telephone Encounter (Signed)
 Returned a call from the patients wife. Rescheduled appointments per the patients request. Will be mailed an appointment reminder.

## 2023-12-14 NOTE — Progress Notes (Unsigned)
 [facility] OFFICE PROGRESS NOTE  [name] Hasty, DO [address] [place] [address]  DIAGNOSIS: Metastatic non-small cell lung cancer, adenocarcinoma initially diagnosed in December of 2004   PRIOR THERAPY: 1) status post course of concurrent chemoradiation under the care of Dr. [name]. 2) status post left upper lobe wedge resection on 01/10/2005 for recurrent disease in the left lung.  CURRENT THERAPY: Erlotinib  150 mg by mouth daily started in 2006.  Starting August 2022 the patient will be on erlotinib  100 mg p.o. daily.  Status post 33 months of treatment.   INTERVAL HISTORY: RONDEY BROOMFIELD [name] 73 y.o. male returns to the clinic for a follow up visit.  The patient was last seen in January 2025 by Dr. [name].  His last appointment he was feeling well except he had a recent episode of syncope and was hospitalized.   Today, he is feeling well. He is accompanied by his wife today. He does not have any concerning complaints. The patient continues to tolerate treatment with oral treatment with Erlotinib  well without any adverse effects.  His last scan was in November, and he is due for another around this time.  He experiences some weight fluctuations, noting that his weight goes 'up and down a couple pounds here and there.' Breathing is generally fine, but he experiences shortness of breath when walking uphill. No cough, hemoptysis, or chest pain.  He has a history of prediabetes and notes that his blood sugar was slightly elevated today, possibly due to recent consumption of candy and sweets. His kidney function has shown mild fluctuations.  In December, he experienced a syncopal episode attributed to a drop in blood pressure, which resulted in a brief hospitalization. During this time, he had a CT head and MRI brain, which revealed a small brain bleed. He recalls passing out and waking up in the emergency room, with no pain reported during the incident.  He has been  dealing with conjunctivitis for about two years, for which he has been prescribed steroid eye drops and other medications. He also reports occasional skin spots that sometimes itch, but dermatological evaluations have deemed them non-concerning.The patient is here today for evaluation and repeat blood work.     MEDICAL HISTORY: Past Medical History:  Diagnosis Date   AAA (abdominal aortic aneurysm) (HCC)    a. 3.4 x 3.5 on scan 02/28/13.   Allergic reaction to contrast dye    Atherosclerosis    CAD (coronary artery disease)    a. stenting of RCA/LCx 2004. b. [place]  08/2013:  s/p DES to LCx for severe ISR.   Diabetes mellitus    Diverticulosis    Emphysema lung (HCC)    Emphysema of lung (HCC)    Encounter for therapeutic drug monitoring 06/06/2016   GERD (gastroesophageal reflux disease)    Hemorrhoids    Hepatic steatosis    History of radiation therapy 09/07/20-09/22/20   SBRT left lung ; Dr Retta Caster   HTN (hypertension)    Hyperlipidemia    Kidney stone    Lung cancer [facility]) dx'd 2004   chemo/xrt comp; tarceva  ongoing- January 2022 radiation   Stroke [facility])     ALLERGIES:  is allergic to iohexol , ivp dye [iodinated contrast media], morphine and codeine, other, and povidone iodine.  MEDICATIONS:  Current Outpatient Medications  Medication Sig Dispense Refill   ACCU-CHEK AVIVA PLUS test strip 1 each by Other route daily.     atorvastatin  (LIPITOR) 40 MG tablet Take 40 mg  by mouth daily.     Cholecalciferol (VITAMIN D3) 75 MCG (3000 UT) TABS Take 50 mcg by mouth daily. Takes 50 mcg/day     cyanocobalamin 1000 MCG tablet Take 1,000 mcg by mouth daily.     denosumab  (PROLIA ) 60 MG/ML SOSY injection Inject 60 mg into the skin every 6 (six) months.     doxycycline  (MONODOX ) 100 MG capsule Take 1 capsule (100 mg total) by mouth daily. 90 capsule 11   erlotinib  (TARCEVA ) 100 MG tablet Take 1 tablet by mouth once daily on an empty stomach, 1 hour before meals or 2 hours after. 30  tablet 10   famotidine  (PEPCID ) 40 MG tablet TAKE 1 TABLET BY MOUTH 2 TIMES DAILY. PLEASE CALL [phone] SCHEDULE AN OFFICE VISIT FOR REFILLS. 180 tablet 0   glimepiride  (AMARYL ) 2 MG tablet Take 4 mg by mouth every morning.     ipratropium (ATROVENT ) 0.06 % nasal spray Place 2 sprays into both nostrils every 6 (six) hours as needed for rhinitis. 15 mL 1   JARDIANCE  25 MG TABS tablet Take 25 mg by mouth daily.     Lancets (FREESTYLE) lancets 1 each by Other route daily.     levocetirizine (XYZAL ) 5 MG tablet Take 1 tablet (5 mg total) by mouth daily as needed (Can take an extra dose during flare ups.). 90 tablet 3   levothyroxine  (SYNTHROID ) 25 MCG tablet Take 25 mcg by mouth every morning.     losartan  (COZAAR ) 25 MG tablet TAKE 1 TABLET BY MOUTH EVERY DAY FOR 90 DAYS     loteprednol  (LOTEMAX ) 0.5 % ophthalmic suspension Place 1 drop into both eyes daily. 15 mL 5   Magnesium  Glycinate 100 MG CAPS Take 2 capsules Orally nightly     metoprolol  tartrate (LOPRESSOR ) 50 MG tablet TAKE 1 TABLET BY MOUTH TWICE A DAY 180 tablet 3   nitroGLYCERIN  (NITROSTAT ) 0.4 MG SL tablet Place 1 tablet (0.4 mg total) under the tongue every 5 (five) minutes as needed for chest pain (up to 3 doses). 25 tablet 3   nystatin-triamcinolone ointment (MYCOLOG) Apply 1 Application topically as needed.     Olopatadine  HCl (PATADAY ) 0.2 % SOLN Place 1 drop into both eyes daily. 8 mL 1   Olopatadine -Mometasone  (RYALTRIS ) 665-25 MCG/ACT SUSP Place 2 sprays into the nose in the morning and at bedtime. 30 g 5   Propylene Glycol, PF, (SYSTANE COMPLETE PF) 0.6 % SOLN Apply 1 drop to eye every 4 (four) hours as needed. 30 mL 1   TRADJENTA  5 MG TABS tablet Take 5 mg by mouth daily.     zaleplon  (SONATA ) 10 MG capsule Take 1 capsule (10 mg total) by mouth at bedtime.     No current facility-administered medications for this visit.    SURGICAL HISTORY:  Past Surgical History:  Procedure Laterality Date   ANGIOPLASTY / STENTING  FEMORAL  08/14/2013   2004-groin   CATARACT EXTRACTION     COLONOSCOPY     LEFT HEART CATHETERIZATION WITH CORONARY ANGIOGRAM N/A 09/08/2013   Procedure: LEFT HEART CATHETERIZATION WITH CORONARY ANGIOGRAM;  Surgeon: Arlander Bellman, MD;  Location[facility] CATH LAB;  Service: Cardiovascular;  Laterality: N/A;   left partial lobectomy     WISDOM TOOTH EXTRACTION      REVIEW OF SYSTEMS:   Review of Systems  Constitutional: Negative for appetite change, chills, fatigue, fever and unexpected weight change.  HENT: Negative for mouth sores, nosebleeds, sore throat and trouble swallowing.   Eyes: positive  for Ectropion. Negative for eye problems and icterus.  Respiratory: Negative for cough, hemoptysis, shortness of breath and wheezing.   Cardiovascular: Negative for chest pain and leg swelling.  Gastrointestinal: Negative for abdominal pain, constipation, diarrhea, nausea and vomiting.  Genitourinary: Negative for bladder incontinence, difficulty urinating, dysuria, frequency and hematuria.   Musculoskeletal: Negative for back pain, gait problem, neck pain and neck stiffness.  Skin: Positive for itching and rash.  Neurological: Negative for dizziness, extremity weakness, gait problem, headaches, light-headedness and seizures.  Hematological: Negative for adenopathy. Does not bruise/bleed easily.  Psychiatric/Behavioral: Negative for confusion, depression and sleep disturbance. The patient is not nervous/anxious.     PHYSICAL EXAMINATION:  Blood pressure (!) 122/58, pulse 68, temperature 98 F (36.7 C), temperature source Temporal, resp. rate 13, weight 192 lb 3.2 oz (87.2 kg), SpO2 99%.  ECOG PERFORMANCE STATUS: 1  Physical Exam  Constitutional: Oriented to person, place, and time and well-developed, well-nourished, and in no distress.  HENT:  Head: Normocephalic and atraumatic.  Mouth/Throat: Oropharynx is clear and moist. No oropharyngeal exudate.  Eyes: Ectropion. Conjunctivae are  normal. Right eye exhibits no discharge. Left eye exhibits no discharge. No scleral icterus.  Neck: Normal range of motion. Neck supple.  Cardiovascular: Normal rate, regular rhythm, normal heart sounds and intact distal pulses.   Pulmonary/Chest: Effort normal and breath sounds normal. No respiratory distress. No wheezes. No rales.  Abdominal: Soft. Bowel sounds are normal. Exhibits no distension and no mass. There is no tenderness.  Musculoskeletal: Normal range of motion. Exhibits no edema.  Lymphadenopathy:    No cervical adenopathy.  Neurological: Alert and oriented to person, place, and time. Exhibits normal muscle tone. Gait normal. Coordination normal.  Skin: Skin is warm and dry. No rash noted. Not diaphoretic. No erythema. No pallor.  Psychiatric: Mood, memory and judgment normal.  Vitals reviewed.  LABORATORY DATA: Lab Results  Component Value Date   WBC 10.9 (H) 12/19/2023   HGB 16.3 12/19/2023   HCT 45.7 12/19/2023   MCV 91.2 12/19/2023   PLT 224 12/19/2023      Chemistry      Component Value Date/Time   NA 136 12/19/2023 1309   NA 139 06/04/2017 0948   K 4.8 12/19/2023 1309   K 5.1 06/04/2017 0948   CL 103 12/19/2023 1309   CL 106 06/10/2012 0900   CO2 26 12/19/2023 1309   CO2 26 06/04/2017 0948   BUN 17 12/19/2023 1309   BUN 14.2 06/04/2017 0948   CREATININE 1.63 (H) 12/19/2023 1309   CREATININE 1.5 (H) 06/04/2017 0948      Component Value Date/Time   CALCIUM  9.0 12/19/2023 1309   CALCIUM  9.6 06/04/2017 0948   ALKPHOS 69 12/19/2023 1309   ALKPHOS 84 06/04/2017 0948   AST 17 12/19/2023 1309   AST 24 06/04/2017 0948   ALT 19 12/19/2023 1309   ALT 35 06/04/2017 0948   BILITOT 1.2 12/19/2023 1309   BILITOT 0.82 06/04/2017 0948       RADIOGRAPHIC STUDIES:  No results found.   ASSESSMENT/PLAN:  This is a very pleasant 73 year old Caucasian male with metastatic non-small cell lung cancer, adenocarcinoma with positive EGFR mutation and he is  currently on treatment with Tarceva  since 2006.  This was a switched to generic Erlotinib  and he is tolerating it well except for mild skin rash   He also underwent SBRT to the enlarging left upper lobe lung nodule. His treatment was switched to erlotinib  100 mg p.o. twice daily  in August 2022.    The patient was seen with Dr. [name]. Labs were reviewed. Recommend he continue on the same treatment at the same dose.   His last CT scan was on 07/10/24. I will arrange for a restaging CT in 2 months. We will order this without contrast due to his CKD. He was advised to hydrate well at home. .    We will see him back for a follow up visit in 2 months for lab work. We arrange CT scans every 6 months.   He will continue to see his specialist for his eye.   I let him know he can use hydrocortisone cream for his rash it it is pruritic.   Prediabetes Slightly elevated blood sugar, possibly dietary-related. Monitoring advised. - Monitor blood sugar levels with primary care physician.  The patient was advised to call immediately if he has any concerning symptoms in the interval. The patient voices understanding of current disease status and treatment options and is in agreement with the current care plan. All questions were answered. The patient knows to call the clinic with any problems, questions or concerns. We can certainly see the patient much sooner if necessary  Orders Placed This Encounter  Procedures   CT Chest Wo Contrast    Standing Status:   Future    Expected Date:   02/14/2024    Expiration Date:   12/18/2024    Preferred imaging location?:   [facility]   CBC with Differential (Cancer Center Only)    Standing Status:   Future    Expected Date:   02/14/2024    Expiration Date:   12/18/2024   CMP (Cancer Center only)    Standing Status:   Future    Expected Date:   04/17/2024    Expiration Date:   12/18/2024     Jewel Mcafee L Iliani Vejar,  PA-C 12/19/23  ADDENDUM: Hematology/Oncology Attending: I had a face-to-face encounter with the patient today.  I reviewed his record, lab and recommended his care plan.  This is a very pleasant 73 years old white male with history of metastatic non-small cell lung cancer, adenocarcinoma that was initially diagnosed in December 2004.  He was treated at that time with a course of concurrent chemoradiation and he also had left upper lobe wedge resection in 2006.  He has been on treatment with Erlotinib  initially at a dose of 150 mg p.o. daily until August 2022 when his dose was reduced to 100 mg p.o. daily status post additional 33 months of treatment.  The patient is here today for evaluation with his wife.  He is feeling fine with no concerning complaints.  He denied having any significant rash or diarrhea.  He denied having any recent weight loss or night sweats.  He has no headache or visual changes.  I recommended for the patient to continue his current treatment with Erlotinib  100 mg p.o. daily. Will see him back for follow-up visit in 2 months for evaluation with repeat CT scan of the chest for restaging of his disease. The patient was advised to call immediately if he has any concerning symptoms in the interval. The total time spent in the appointment was 20 minutes. Disclaimer: This note was dictated with voice recognition software. Similar sounding words can inadvertently be transcribed and may be missed upon review. Aurelio Blower, MD

## 2023-12-19 ENCOUNTER — Inpatient Hospital Stay: Attending: Physician Assistant

## 2023-12-19 ENCOUNTER — Inpatient Hospital Stay: Admitting: Physician Assistant

## 2023-12-19 VITALS — BP 122/58 | HR 68 | Temp 98.0°F | Resp 13 | Wt 192.2 lb

## 2023-12-19 DIAGNOSIS — C3412 Malignant neoplasm of upper lobe, left bronchus or lung: Secondary | ICD-10-CM | POA: Insufficient documentation

## 2023-12-19 DIAGNOSIS — C3491 Malignant neoplasm of unspecified part of right bronchus or lung: Secondary | ICD-10-CM | POA: Diagnosis not present

## 2023-12-19 DIAGNOSIS — Z902 Acquired absence of lung [part of]: Secondary | ICD-10-CM | POA: Diagnosis not present

## 2023-12-19 DIAGNOSIS — Z79899 Other long term (current) drug therapy: Secondary | ICD-10-CM | POA: Insufficient documentation

## 2023-12-19 LAB — CBC WITH DIFFERENTIAL (CANCER CENTER ONLY)
Abs Immature Granulocytes: 0.04 10*3/uL (ref 0.00–0.07)
Basophils Absolute: 0.1 10*3/uL (ref 0.0–0.1)
Basophils Relative: 1 %
Eosinophils Absolute: 0.3 10*3/uL (ref 0.0–0.5)
Eosinophils Relative: 3 %
HCT: 45.7 % (ref 39.0–52.0)
Hemoglobin: 16.3 g/dL (ref 13.0–17.0)
Immature Granulocytes: 0 %
Lymphocytes Relative: 18 %
Lymphs Abs: 2 10*3/uL (ref 0.7–4.0)
MCH: 32.5 pg (ref 26.0–34.0)
MCHC: 35.7 g/dL (ref 30.0–36.0)
MCV: 91.2 fL (ref 80.0–100.0)
Monocytes Absolute: 0.8 10*3/uL (ref 0.1–1.0)
Monocytes Relative: 8 %
Neutro Abs: 7.7 10*3/uL (ref 1.7–7.7)
Neutrophils Relative %: 70 %
Platelet Count: 224 10*3/uL (ref 150–400)
RBC: 5.01 MIL/uL (ref 4.22–5.81)
RDW: 14.1 % (ref 11.5–15.5)
WBC Count: 10.9 10*3/uL — ABNORMAL HIGH (ref 4.0–10.5)
nRBC: 0 % (ref 0.0–0.2)

## 2023-12-19 LAB — CMP (CANCER CENTER ONLY)
ALT: 19 U/L (ref 0–44)
AST: 17 U/L (ref 15–41)
Albumin: 4 g/dL (ref 3.5–5.0)
Alkaline Phosphatase: 69 U/L (ref 38–126)
Anion gap: 7 (ref 5–15)
BUN: 17 mg/dL (ref 8–23)
CO2: 26 mmol/L (ref 22–32)
Calcium: 9 mg/dL (ref 8.9–10.3)
Chloride: 103 mmol/L (ref 98–111)
Creatinine: 1.63 mg/dL — ABNORMAL HIGH (ref 0.61–1.24)
GFR, Estimated: 44 mL/min — ABNORMAL LOW (ref >60)
Glucose, Bld: 255 mg/dL — ABNORMAL HIGH (ref 70–99)
Potassium: 4.8 mmol/L (ref 3.5–5.1)
Sodium: 136 mmol/L (ref 135–145)
Total Bilirubin: 1.2 mg/dL (ref 0.0–1.2)
Total Protein: 6.7 g/dL (ref 6.5–8.1)

## 2023-12-24 ENCOUNTER — Other Ambulatory Visit: Payer: Self-pay | Admitting: Allergy and Immunology

## 2023-12-24 ENCOUNTER — Other Ambulatory Visit: Payer: Self-pay | Admitting: Internal Medicine

## 2024-01-22 ENCOUNTER — Other Ambulatory Visit (HOSPITAL_COMMUNITY): Payer: Self-pay | Admitting: *Deleted

## 2024-01-24 ENCOUNTER — Ambulatory Visit (HOSPITAL_COMMUNITY)
Admission: RE | Admit: 2024-01-24 | Discharge: 2024-01-24 | Disposition: A | Discharge status: Home / Self Care | Source: Ambulatory Visit | Attending: Internal Medicine | Admitting: Internal Medicine

## 2024-01-24 DIAGNOSIS — M810 Age-related osteoporosis without current pathological fracture: Secondary | ICD-10-CM | POA: Diagnosis present

## 2024-01-24 MED ORDER — DENOSUMAB 60 MG/ML ~~LOC~~ SOSY
PREFILLED_SYRINGE | SUBCUTANEOUS | Status: AC
  Filled 2024-01-24: qty 1

## 2024-01-24 MED ORDER — DENOSUMAB 60 MG/ML ~~LOC~~ SOSY
60.0000 mg | PREFILLED_SYRINGE | Freq: Once | SUBCUTANEOUS | Status: AC
  Administered 2024-01-24: 60 mg via SUBCUTANEOUS

## 2024-02-02 ENCOUNTER — Encounter (HOSPITAL_COMMUNITY): Payer: Self-pay

## 2024-02-02 ENCOUNTER — Other Ambulatory Visit: Payer: Self-pay

## 2024-02-02 ENCOUNTER — Inpatient Hospital Stay (HOSPITAL_COMMUNITY)
Admission: EM | Admit: 2024-02-02 | Discharge: 2024-02-05 | DRG: 871 | Disposition: A | Discharge status: Home / Self Care | Attending: Internal Medicine | Admitting: Internal Medicine

## 2024-02-02 ENCOUNTER — Emergency Department (HOSPITAL_COMMUNITY)

## 2024-02-02 DIAGNOSIS — E1122 Type 2 diabetes mellitus with diabetic chronic kidney disease: Secondary | ICD-10-CM | POA: Diagnosis present

## 2024-02-02 DIAGNOSIS — N179 Acute kidney failure, unspecified: Secondary | ICD-10-CM | POA: Diagnosis not present

## 2024-02-02 DIAGNOSIS — R6521 Severe sepsis with septic shock: Secondary | ICD-10-CM

## 2024-02-02 DIAGNOSIS — I129 Hypertensive chronic kidney disease with stage 1 through stage 4 chronic kidney disease, or unspecified chronic kidney disease: Secondary | ICD-10-CM | POA: Diagnosis present

## 2024-02-02 DIAGNOSIS — Z888 Allergy status to other drugs, medicaments and biological substances status: Secondary | ICD-10-CM

## 2024-02-02 DIAGNOSIS — R6520 Severe sepsis without septic shock: Secondary | ICD-10-CM | POA: Diagnosis present

## 2024-02-02 DIAGNOSIS — Z79890 Hormone replacement therapy (postmenopausal): Secondary | ICD-10-CM

## 2024-02-02 DIAGNOSIS — Z955 Presence of coronary angioplasty implant and graft: Secondary | ICD-10-CM

## 2024-02-02 DIAGNOSIS — J159 Unspecified bacterial pneumonia: Secondary | ICD-10-CM | POA: Diagnosis present

## 2024-02-02 DIAGNOSIS — R579 Shock, unspecified: Secondary | ICD-10-CM | POA: Diagnosis present

## 2024-02-02 DIAGNOSIS — E876 Hypokalemia: Secondary | ICD-10-CM | POA: Diagnosis present

## 2024-02-02 DIAGNOSIS — E1169 Type 2 diabetes mellitus with other specified complication: Secondary | ICD-10-CM | POA: Diagnosis present

## 2024-02-02 DIAGNOSIS — N1832 Chronic kidney disease, stage 3b: Secondary | ICD-10-CM | POA: Diagnosis present

## 2024-02-02 DIAGNOSIS — A419 Sepsis, unspecified organism: Secondary | ICD-10-CM | POA: Diagnosis present

## 2024-02-02 DIAGNOSIS — E11649 Type 2 diabetes mellitus with hypoglycemia without coma: Secondary | ICD-10-CM | POA: Diagnosis not present

## 2024-02-02 DIAGNOSIS — Z79899 Other long term (current) drug therapy: Secondary | ICD-10-CM

## 2024-02-02 DIAGNOSIS — C3491 Malignant neoplasm of unspecified part of right bronchus or lung: Secondary | ICD-10-CM | POA: Diagnosis present

## 2024-02-02 DIAGNOSIS — Z883 Allergy status to other anti-infective agents status: Secondary | ICD-10-CM

## 2024-02-02 DIAGNOSIS — I10 Essential (primary) hypertension: Secondary | ICD-10-CM | POA: Diagnosis present

## 2024-02-02 DIAGNOSIS — K219 Gastro-esophageal reflux disease without esophagitis: Secondary | ICD-10-CM | POA: Diagnosis present

## 2024-02-02 DIAGNOSIS — Z91041 Radiographic dye allergy status: Secondary | ICD-10-CM

## 2024-02-02 DIAGNOSIS — Z87891 Personal history of nicotine dependence: Secondary | ICD-10-CM

## 2024-02-02 DIAGNOSIS — Z885 Allergy status to narcotic agent status: Secondary | ICD-10-CM

## 2024-02-02 DIAGNOSIS — Z8249 Family history of ischemic heart disease and other diseases of the circulatory system: Secondary | ICD-10-CM | POA: Diagnosis not present

## 2024-02-02 DIAGNOSIS — F321 Major depressive disorder, single episode, moderate: Secondary | ICD-10-CM | POA: Diagnosis present

## 2024-02-02 DIAGNOSIS — I2510 Atherosclerotic heart disease of native coronary artery without angina pectoris: Secondary | ICD-10-CM | POA: Diagnosis present

## 2024-02-02 DIAGNOSIS — Z833 Family history of diabetes mellitus: Secondary | ICD-10-CM

## 2024-02-02 DIAGNOSIS — E039 Hypothyroidism, unspecified: Secondary | ICD-10-CM | POA: Diagnosis present

## 2024-02-02 DIAGNOSIS — J439 Emphysema, unspecified: Secondary | ICD-10-CM | POA: Diagnosis present

## 2024-02-02 DIAGNOSIS — Z7984 Long term (current) use of oral hypoglycemic drugs: Secondary | ICD-10-CM

## 2024-02-02 DIAGNOSIS — Z8673 Personal history of transient ischemic attack (TIA), and cerebral infarction without residual deficits: Secondary | ICD-10-CM

## 2024-02-02 DIAGNOSIS — Z923 Personal history of irradiation: Secondary | ICD-10-CM

## 2024-02-02 DIAGNOSIS — E860 Dehydration: Secondary | ICD-10-CM | POA: Diagnosis present

## 2024-02-02 DIAGNOSIS — Z85118 Personal history of other malignant neoplasm of bronchus and lung: Secondary | ICD-10-CM

## 2024-02-02 DIAGNOSIS — Z8679 Personal history of other diseases of the circulatory system: Secondary | ICD-10-CM

## 2024-02-02 DIAGNOSIS — Z9221 Personal history of antineoplastic chemotherapy: Secondary | ICD-10-CM

## 2024-02-02 DIAGNOSIS — I714 Abdominal aortic aneurysm, without rupture, unspecified: Secondary | ICD-10-CM | POA: Diagnosis present

## 2024-02-02 DIAGNOSIS — E872 Acidosis, unspecified: Secondary | ICD-10-CM | POA: Diagnosis present

## 2024-02-02 DIAGNOSIS — E785 Hyperlipidemia, unspecified: Secondary | ICD-10-CM | POA: Diagnosis present

## 2024-02-02 DIAGNOSIS — Z87442 Personal history of urinary calculi: Secondary | ICD-10-CM

## 2024-02-02 DIAGNOSIS — Z808 Family history of malignant neoplasm of other organs or systems: Secondary | ICD-10-CM

## 2024-02-02 DIAGNOSIS — Z7982 Long term (current) use of aspirin: Secondary | ICD-10-CM

## 2024-02-02 DIAGNOSIS — E1151 Type 2 diabetes mellitus with diabetic peripheral angiopathy without gangrene: Secondary | ICD-10-CM | POA: Diagnosis present

## 2024-02-02 DIAGNOSIS — Z8041 Family history of malignant neoplasm of ovary: Secondary | ICD-10-CM

## 2024-02-02 LAB — I-STAT CG4 LACTIC ACID, ED
Lactic Acid, Venous: 3.8 mmol/L (ref 0.5–1.9)
Lactic Acid, Venous: 2.4 mmol/L (ref 0.5–1.9)

## 2024-02-02 LAB — CBC WITH DIFFERENTIAL/PLATELET
Abs Immature Granulocytes: 0.06 10*3/uL (ref 0.00–0.07)
Basophils Absolute: 0 10*3/uL (ref 0.0–0.1)
Basophils Relative: 0 %
Eosinophils Absolute: 0 10*3/uL (ref 0.0–0.5)
Eosinophils Relative: 0 %
HCT: 40.8 % (ref 39.0–52.0)
Hemoglobin: 14.5 g/dL (ref 13.0–17.0)
Immature Granulocytes: 1 %
Lymphocytes Relative: 3 %
Lymphs Abs: 0.4 10*3/uL — ABNORMAL LOW (ref 0.7–4.0)
MCH: 33.2 pg (ref 26.0–34.0)
MCHC: 35.5 g/dL (ref 30.0–36.0)
MCV: 93.4 fL (ref 80.0–100.0)
Monocytes Absolute: 0.1 10*3/uL (ref 0.1–1.0)
Monocytes Relative: 1 %
Neutro Abs: 11 10*3/uL — ABNORMAL HIGH (ref 1.7–7.7)
Neutrophils Relative %: 95 %
Platelets: 175 10*3/uL (ref 150–400)
RBC: 4.37 MIL/uL (ref 4.22–5.81)
RDW: 14 % (ref 11.5–15.5)
WBC: 11.6 10*3/uL — ABNORMAL HIGH (ref 4.0–10.5)
nRBC: 0 % (ref 0.0–0.2)

## 2024-02-02 LAB — URINALYSIS, W/ REFLEX TO CULTURE (INFECTION SUSPECTED)
Bacteria, UA: NONE SEEN
Bilirubin Urine: NEGATIVE
Glucose, UA: >=500 mg/dL — AB
Ketones, ur: NEGATIVE mg/dL
Leukocytes,Ua: NEGATIVE
Nitrite: NEGATIVE
Protein, ur: NEGATIVE mg/dL
Specific Gravity, Urine: 1.022 (ref 1.005–1.030)
pH: 5 (ref 5.0–8.0)

## 2024-02-02 LAB — COMPREHENSIVE METABOLIC PANEL WITH GFR
ALT: 23 U/L (ref 0–44)
AST: 29 U/L (ref 15–41)
Albumin: 3.1 g/dL — ABNORMAL LOW (ref 3.5–5.0)
Alkaline Phosphatase: 63 U/L (ref 38–126)
Anion gap: 12 (ref 5–15)
BUN: 21 mg/dL (ref 8–23)
CO2: 21 mmol/L — ABNORMAL LOW (ref 22–32)
Calcium: 8.9 mg/dL (ref 8.9–10.3)
Chloride: 104 mmol/L (ref 98–111)
Creatinine, Ser: 1.79 mg/dL — ABNORMAL HIGH (ref 0.61–1.24)
GFR, Estimated: 39 mL/min — ABNORMAL LOW (ref >60)
Glucose, Bld: 179 mg/dL — ABNORMAL HIGH (ref 70–99)
Potassium: 3.5 mmol/L (ref 3.5–5.1)
Sodium: 137 mmol/L (ref 135–145)
Total Bilirubin: 1.6 mg/dL — ABNORMAL HIGH (ref 0.0–1.2)
Total Protein: 5.9 g/dL — ABNORMAL LOW (ref 6.5–8.1)

## 2024-02-02 LAB — I-STAT VENOUS BLOOD GAS, ED
Acid-base deficit: 2 mmol/L (ref 0.0–2.0)
Bicarbonate: 20.6 mmol/L (ref 20.0–28.0)
Calcium, Ion: 1.07 mmol/L — ABNORMAL LOW (ref 1.15–1.40)
HCT: 40 % (ref 39.0–52.0)
Hemoglobin: 13.6 g/dL (ref 13.0–17.0)
O2 Saturation: 83 %
Potassium: 3.6 mmol/L (ref 3.5–5.1)
Sodium: 139 mmol/L (ref 135–145)
TCO2: 21 mmol/L — ABNORMAL LOW (ref 22–32)
pCO2, Ven: 28.8 mmHg — ABNORMAL LOW (ref 44–60)
pH, Ven: 7.462 — ABNORMAL HIGH (ref 7.25–7.43)
pO2, Ven: 44 mmHg (ref 32–45)

## 2024-02-02 LAB — I-STAT CHEM 8, ED
BUN: 22 mg/dL (ref 8–23)
Calcium, Ion: 1.11 mmol/L — ABNORMAL LOW (ref 1.15–1.40)
Chloride: 104 mmol/L (ref 98–111)
Creatinine, Ser: 1.7 mg/dL — ABNORMAL HIGH (ref 0.61–1.24)
Glucose, Bld: 176 mg/dL — ABNORMAL HIGH (ref 70–99)
HCT: 40 % (ref 39.0–52.0)
Hemoglobin: 13.6 g/dL (ref 13.0–17.0)
Potassium: 3.5 mmol/L (ref 3.5–5.1)
Sodium: 138 mmol/L (ref 135–145)
TCO2: 19 mmol/L — ABNORMAL LOW (ref 22–32)

## 2024-02-02 LAB — PROTIME-INR
INR: 1.1 (ref 0.8–1.2)
Prothrombin Time: 14.4 s (ref 11.4–15.2)

## 2024-02-02 LAB — TROPONIN I (HIGH SENSITIVITY)
Troponin I (High Sensitivity): 29 ng/L — ABNORMAL HIGH (ref <18)
Troponin I (High Sensitivity): 48 ng/L — ABNORMAL HIGH (ref <18)

## 2024-02-02 LAB — RESP PANEL BY RT-PCR (RSV, FLU A&B, COVID)  RVPGX2
Influenza A by PCR: NEGATIVE
Influenza B by PCR: NEGATIVE
Resp Syncytial Virus by PCR: NEGATIVE
SARS Coronavirus 2 by RT PCR: NEGATIVE

## 2024-02-02 LAB — LIPASE, BLOOD: Lipase: 58 U/L — ABNORMAL HIGH (ref 11–51)

## 2024-02-02 MED ORDER — VANCOMYCIN HCL 1500 MG/300ML IV SOLN
1500.0000 mg | Freq: Once | INTRAVENOUS | Status: AC
  Administered 2024-02-02: 1500 mg via INTRAVENOUS
  Filled 2024-02-02: qty 300

## 2024-02-02 MED ORDER — LORATADINE 10 MG PO TABS: 10.0000 mg | ORAL_TABLET | Freq: Every day | ORAL | Status: DC | PRN

## 2024-02-02 MED ORDER — ERLOTINIB HCL 100 MG PO TABS
100.0000 mg | ORAL_TABLET | Freq: Every day | ORAL | Status: DC
  Administered 2024-02-03 – 2024-02-04 (×2): 100 mg via ORAL
  Filled 2024-02-02 (×7): qty 1

## 2024-02-02 MED ORDER — FENTANYL CITRATE PF 50 MCG/ML IJ SOSY: 12.5000 ug | PREFILLED_SYRINGE | INTRAMUSCULAR | Status: DC | PRN

## 2024-02-02 MED ORDER — SODIUM CHLORIDE 0.9 % IV SOLN
2.0000 g | Freq: Once | INTRAVENOUS | Status: AC
  Administered 2024-02-02: 2 g via INTRAVENOUS
  Filled 2024-02-02: qty 12.5

## 2024-02-02 MED ORDER — ACETAMINOPHEN 325 MG PO TABS
650.0000 mg | ORAL_TABLET | Freq: Four times a day (QID) | ORAL | Status: DC | PRN
Start: 2024-02-02 — End: 2024-02-05

## 2024-02-02 MED ORDER — LACTATED RINGERS IV SOLN: INTRAVENOUS | Status: AC

## 2024-02-02 MED ORDER — ACETAMINOPHEN 500 MG PO TABS
1000.0000 mg | ORAL_TABLET | Freq: Once | ORAL | Status: AC
  Administered 2024-02-02: 1000 mg via ORAL
  Filled 2024-02-02: qty 2

## 2024-02-02 MED ORDER — METRONIDAZOLE 500 MG/100ML IV SOLN
500.0000 mg | Freq: Once | INTRAVENOUS | Status: AC
  Administered 2024-02-02: 500 mg via INTRAVENOUS
  Filled 2024-02-02: qty 100

## 2024-02-02 MED ORDER — LINAGLIPTIN 5 MG PO TABS
5.0000 mg | ORAL_TABLET | Freq: Every day | ORAL | Status: DC
  Administered 2024-02-03 – 2024-02-05 (×3): 5 mg via ORAL
  Filled 2024-02-02 (×3): qty 1

## 2024-02-02 MED ORDER — VITAMIN B-12 1000 MCG PO TABS
1000.0000 ug | ORAL_TABLET | Freq: Every day | ORAL | Status: DC
  Administered 2024-02-03 – 2024-02-05 (×3): 1000 ug via ORAL
  Filled 2024-02-02 (×3): qty 1

## 2024-02-02 MED ORDER — VANCOMYCIN HCL IN DEXTROSE 1-5 GM/200ML-% IV SOLN: 1000.0000 mg | Freq: Once | INTRAVENOUS | Status: DC

## 2024-02-02 MED ORDER — OLOPATADINE HCL 0.1 % OP SOLN
1.0000 [drp] | Freq: Two times a day (BID) | OPHTHALMIC | Status: DC
  Administered 2024-02-03 – 2024-02-04 (×3): 1 [drp] via OPHTHALMIC
  Filled 2024-02-02: qty 5

## 2024-02-02 MED ORDER — LEVOTHYROXINE SODIUM 25 MCG PO TABS
25.0000 ug | ORAL_TABLET | Freq: Every day | ORAL | Status: DC
  Administered 2024-02-03 – 2024-02-05 (×3): 25 ug via ORAL
  Filled 2024-02-02 (×3): qty 1

## 2024-02-02 MED ORDER — METOPROLOL TARTRATE 5 MG/5ML IV SOLN: 5.0000 mg | Freq: Four times a day (QID) | INTRAVENOUS | Status: DC | PRN

## 2024-02-02 MED ORDER — ATORVASTATIN CALCIUM 40 MG PO TABS
40.0000 mg | ORAL_TABLET | Freq: Every day | ORAL | Status: DC
  Administered 2024-02-03 – 2024-02-05 (×3): 40 mg via ORAL
  Filled 2024-02-02 (×3): qty 1

## 2024-02-02 MED ORDER — SODIUM CHLORIDE 0.9 % IV SOLN
2.0000 g | INTRAVENOUS | Status: DC
  Administered 2024-02-03 – 2024-02-05 (×3): 2 g via INTRAVENOUS
  Filled 2024-02-02 (×3): qty 20

## 2024-02-02 MED ORDER — FAMOTIDINE 20 MG PO TABS
40.0000 mg | ORAL_TABLET | Freq: Two times a day (BID) | ORAL | Status: DC
  Administered 2024-02-02 – 2024-02-05 (×6): 40 mg via ORAL
  Filled 2024-02-02 (×6): qty 2

## 2024-02-02 MED ORDER — IPRATROPIUM BROMIDE 0.06 % NA SOLN: 2.0000 | Freq: Four times a day (QID) | NASAL | Status: DC | PRN

## 2024-02-02 MED ORDER — ALBUTEROL SULFATE (2.5 MG/3ML) 0.083% IN NEBU
2.5000 mg | INHALATION_SOLUTION | Freq: Four times a day (QID) | RESPIRATORY_TRACT | Status: DC
  Administered 2024-02-03 – 2024-02-04 (×5): 2.5 mg via RESPIRATORY_TRACT
  Filled 2024-02-02 (×5): qty 3

## 2024-02-02 MED ORDER — LOTEPREDNOL ETABONATE 0.5 % OP SUSP
1.0000 [drp] | Freq: Every day | OPHTHALMIC | Status: DC
  Administered 2024-02-03 – 2024-02-05 (×3): 1 [drp] via OPHTHALMIC
  Filled 2024-02-02 (×2): qty 5

## 2024-02-02 MED ORDER — SODIUM CHLORIDE 0.9 % IV SOLN
500.0000 mg | INTRAVENOUS | Status: DC
  Administered 2024-02-02 – 2024-02-05 (×3): 500 mg via INTRAVENOUS
  Filled 2024-02-02 (×3): qty 5

## 2024-02-02 MED ORDER — VITAMIN D 25 MCG (1000 UNIT) PO TABS
2000.0000 [IU] | ORAL_TABLET | Freq: Every day | ORAL | Status: DC
  Administered 2024-02-03 – 2024-02-05 (×3): 2000 [IU] via ORAL
  Filled 2024-02-02 (×3): qty 2

## 2024-02-02 MED ORDER — OLOPATADINE-MOMETASONE 665-25 MCG/ACT NA SUSP: 2.0000 | Freq: Every day | NASAL | Status: DC

## 2024-02-02 MED ORDER — GLIMEPIRIDE 4 MG PO TABS
4.0000 mg | ORAL_TABLET | Freq: Every morning | ORAL | Status: DC
  Administered 2024-02-03: 4 mg via ORAL
  Filled 2024-02-02 (×2): qty 1

## 2024-02-02 MED ORDER — ACETAMINOPHEN 650 MG RE SUPP: 650.0000 mg | Freq: Four times a day (QID) | RECTAL | Status: DC | PRN

## 2024-02-02 MED ORDER — ZOLPIDEM TARTRATE 5 MG PO TABS
5.0000 mg | ORAL_TABLET | Freq: Every evening | ORAL | Status: DC | PRN
  Administered 2024-02-03 – 2024-02-04 (×2): 5 mg via ORAL
  Filled 2024-02-02 (×2): qty 1

## 2024-02-02 MED ORDER — LACTATED RINGERS IV BOLUS (SEPSIS)
1000.0000 mL | Freq: Once | INTRAVENOUS | Status: AC
  Administered 2024-02-02: 1000 mL via INTRAVENOUS

## 2024-02-02 MED ORDER — INSULIN ASPART 100 UNIT/ML IJ SOLN: 0.0000 [IU] | Freq: Three times a day (TID) | INTRAMUSCULAR | Status: DC

## 2024-02-02 MED ORDER — POLYETHYLENE GLYCOL 3350 17 G PO PACK: 17.0000 g | PACK | Freq: Every day | ORAL | Status: DC | PRN

## 2024-02-02 MED ORDER — EMPAGLIFLOZIN 25 MG PO TABS
25.0000 mg | ORAL_TABLET | Freq: Every day | ORAL | Status: DC
  Administered 2024-02-03 – 2024-02-05 (×3): 25 mg via ORAL
  Filled 2024-02-02 (×3): qty 1

## 2024-02-02 MED ORDER — POLYVINYL ALCOHOL 1.4 % OP SOLN: 1.0000 [drp] | OPHTHALMIC | Status: DC | PRN

## 2024-02-02 MED ORDER — LACTATED RINGERS IV BOLUS (SEPSIS): 1000.0000 mL | Freq: Once | INTRAVENOUS | Status: AC

## 2024-02-02 NOTE — ED Triage Notes (Signed)
 PT brought in by EMS as a code sepsis, PT was at home got clammy and cold chills BP dropped per EMS and HR sstayed around 120 PT has a HX of a AAA, PT is alert and talking, BP was 67/55 upon arrival and tem,p was 103. Hr was 122

## 2024-02-02 NOTE — ED Provider Notes (Signed)
 [place] [facility] Provider Note   CSN: [id number] Arrival date & time: 02/02/24  8096     Patient presents with: Code Sepsis   [name] is a 73 y.o. male w pmh of AAA, PVD, CAD, CKD, GERDM emphysema, and hx of lung cancer  and CHFwho present swith cc of pain and hypotension. Hx givem by patient and EMS at bedside. Patient was eating w/ family. He went to the bathroom and when he stood up and sudden onset of intense pain in has back radiating between hsi shoulder blades. He staets that he began shaking all over after that. His wife took his temp and he had a fever of 100.6. Wife gave him tylenol  but he vomited.  Pt reports he was feeling fine and has had not complaints until sudden onset of sxs this evening. Patients initial BP  was elevated in the 160s and he was apparently gray and diaphoretic.  His appearance improved with transport, but EMS reports that his bp fell and was 80/P at arrival.  He denies chest pain. Sob, abd pain. He was given zofran  PTA with resolution of his nausea. His HR has been perisistently in the 120s showing sinus tach per EMS.  {Add pertinent medical, surgical, social history, OB history to HPI:32947} HPI     Prior to Admission medications   Medication Sig Start Date End Date Taking? Authorizing Provider  ACCU-CHEK AVIVA PLUS test strip 1 each by Other route daily. 06/08/20   [provider]  atorvastatin  (LIPITOR) 40 MG tablet Take 40 mg by mouth daily.    [provider]  Cholecalciferol (VITAMIN D3) 75 MCG (3000 UT) TABS Take 50 mcg by mouth daily. Takes 50 mcg/day    [provider]  cyanocobalamin 1000 MCG tablet Take 1,000 mcg by mouth daily.    [provider]  denosumab  (PROLIA ) 60 MG/ML SOSY injection Inject 60 mg into the skin every 6 (six) months. 06/25/23   [provider]  doxycycline  (MONODOX ) 100 MG capsule Take 1 capsule (100 mg total) by mouth daily. 11/21/23    Kozlow, Camellia PARAS, MD  erlotinib  (TARCEVA ) 100 MG tablet Take 1 tablet by mouth once daily on an empty stomach, 1 hour before meals or 2 hours after. 10/18/23   Sherrod Sherrod, MD  famotidine  (PEPCID ) 40 MG tablet TAKE 1 TABLET BY MOUTH 2 TIMES DAILY. PLEASE CALL [phone] SCHEDULE AN OFFICE VISIT FOR REFILLS. 10/30/23   Abran Norleen SAILOR, MD  glimepiride  (AMARYL ) 2 MG tablet Take 4 mg by mouth every morning. 07/05/21   [provider]  ipratropium (ATROVENT ) 0.06 % nasal spray PLACE 2 SPRAYS INTO BOTH NOSTRILS EVERY 6 (SIX) HOURS AS NEEDED FOR RHINITIS. 12/25/23   Kozlow, Camellia PARAS, MD  JARDIANCE  25 MG TABS tablet Take 25 mg by mouth daily. 06/29/21   [provider]  Lancets (FREESTYLE) lancets 1 each by Other route daily. 06/08/20   [provider]  levocetirizine (XYZAL ) 5 MG tablet Take 1 tablet (5 mg total) by mouth daily as needed (Can take an extra dose during flare ups.). 11/21/23   Kozlow, Camellia PARAS, MD  levothyroxine  (SYNTHROID ) 25 MCG tablet Take 25 mcg by mouth every morning. 05/27/22   [provider]  losartan  (COZAAR ) 25 MG tablet TAKE 1 TABLET BY MOUTH EVERY DAY FOR 90 DAYS    [provider]  loteprednol  (LOTEMAX ) 0.5 % ophthalmic suspension Place 1 drop into both eyes daily. 05/22/23  Kozlow, Camellia PARAS, MD  Magnesium  Glycinate 100 MG CAPS Take 2 capsules Orally nightly    [provider]  metoprolol  tartrate (LOPRESSOR ) 50 MG tablet TAKE 1 TABLET BY MOUTH TWICE A DAY 10/31/23   Wonda Sharper, MD  nitroGLYCERIN  (NITROSTAT ) 0.4 MG SL tablet Place 1 tablet (0.4 mg total) under the tongue every 5 (five) minutes as needed for chest pain (up to 3 doses). 09/09/13   Dunn, Dayna N, PA-C  nystatin-triamcinolone ointment (MYCOLOG) Apply 1 Application topically as needed. 04/25/22   [provider]  Olopatadine  HCl (PATADAY ) 0.2 % SOLN Place 1 drop into both eyes daily. 05/22/23   Kozlow, Camellia PARAS, MD  Olopatadine -Mometasone  (RYALTRIS ) [phone] MCG/ACT  SUSP Place 2 sprays into the nose in the morning and at bedtime. 11/22/23   Ambs, Arlean HERO, FNP  Propylene Glycol, PF, (SYSTANE COMPLETE PF) 0.6 % SOLN Apply 1 drop to eye every 4 (four) hours as needed. 05/22/23   Kozlow, Camellia PARAS, MD  TRADJENTA  5 MG TABS tablet Take 5 mg by mouth daily. 06/01/20   [provider]  zaleplon  (SONATA ) 10 MG capsule Take 1 capsule (10 mg total) by mouth at bedtime. 08/14/23   Bryn Bernardino NOVAK, MD    Allergies: Iohexol , Ivp dye [iodinated contrast media], Morphine and codeine, Other, and Povidone iodine    Review of Systems  Updated Vital Signs BP (!) 67/55   Pulse (!) 127   Temp (!) 103.1 F (39.5 C)   Resp (!) 22   Ht 6' 1 (1.854 m)   Wt 86.2 kg   SpO2 97%   BMI 25.07 kg/m   Physical Exam Vitals and nursing note reviewed.  Constitutional:      General: He is not in acute distress.    Appearance: He is well-developed. He is not diaphoretic.  HENT:     Head: Normocephalic and atraumatic.   Eyes:     General: No scleral icterus.    Conjunctiva/sclera: Conjunctivae normal.    Cardiovascular:     Rate and Rhythm: Regular rhythm. Tachycardia present.     Heart sounds: Normal heart sounds.  Pulmonary:     Effort: Pulmonary effort is normal. No respiratory distress.     Breath sounds: Normal breath sounds. No wheezing, rhonchi or rales.  Abdominal:     General: There is no distension.     Palpations: Abdomen is soft.     Tenderness: There is no abdominal tenderness.   Musculoskeletal:     Cervical back: Normal range of motion and neck supple.   Skin:    General: Skin is warm and dry.   Neurological:     Mental Status: He is alert.   Psychiatric:        Behavior: Behavior normal.     (all labs ordered are listed, but only abnormal results are displayed) Labs Reviewed  RESP PANEL BY RT-PCR (RSV, FLU A&B, COVID)  RVPGX2  CULTURE, BLOOD (ROUTINE X 2)  CULTURE, BLOOD (ROUTINE X 2)  COMPREHENSIVE METABOLIC PANEL WITH GFR  CBC WITH  DIFFERENTIAL/PLATELET  PROTIME-INR  LIPASE, BLOOD  URINALYSIS, W/ REFLEX TO CULTURE (INFECTION SUSPECTED)  I-STAT CG4 LACTIC ACID, ED  I-STAT CHEM 8, ED  I-STAT VENOUS BLOOD GAS, ED  TROPONIN I (HIGH SENSITIVITY)    EKG: None  Radiology: No results found.  {Document cardiac monitor, telemetry assessment procedure when appropriate:32947} Procedures   Medications Ordered in the ED  lactated ringers  infusion (has no administration in time range)  lactated ringers   bolus 1,000 mL (has no administration in time range)    And  lactated ringers  bolus 1,000 mL (has no administration in time range)    And  lactated ringers  bolus 1,000 mL (has no administration in time range)  ceFEPIme  (MAXIPIME ) 2 g in sodium chloride  0.9 % 100 mL IVPB (has no administration in time range)  metroNIDAZOLE  (FLAGYL ) IVPB 500 mg (has no administration in time range)  vancomycin  (VANCOCIN ) IVPB 1000 mg/200 mL premix (has no administration in time range)    Clinical Course as of 02/02/24 1936  Sat Feb 02, 2024  1924 Temp(!): 103.1 F (39.5 C) [AH]  1924 Pulse Rate(!): 127 [AH]  1924 BP(!): 67/55 [AH]  1924 Resp(!): 22 [AH]  1934 Lactic Acid, Venous(!!): 3.8 LA high  [AH]    Clinical Course User Index [AH] Arloa Chroman, PA-C   {Click here for ABCD2, HEART and other calculators REFRESH Note before signing:1}                              Medical Decision Making Amount and/or Complexity of Data Reviewed Labs: ordered. Radiology: ordered.  Risk Prescription drug management.   This patient presents to the ED for concern of hypotension, this involves an extensive number of treatment options, and is a complaint that carries with it a high risk of complications and morbidity.  DDX includes sepsis, MI, ruptured AAA less likely, dehydration, PE less likely.  Initial plan is to activate code sepsis and corresponding protocol and workup  Will see how his BP responds to rapid fluid resuscitaiton  Co  morbidities: .  has a past medical history of AAA (abdominal aortic aneurysm) (HCC), Allergic reaction to contrast dye, Atherosclerosis, CAD (coronary artery disease), Diabetes mellitus, Diverticulosis, Emphysema lung (HCC), Emphysema of lung (HCC), Encounter for therapeutic drug monitoring (06/06/2016), GERD (gastroesophageal reflux disease), Hemorrhoids, Hepatic steatosis, History of radiation therapy (09/07/20-09/22/20), HTN (hypertension), Hyperlipidemia, Kidney stone, Lung cancer (HCC) (dx'd 2004), and Stroke (HCC).   Social Determinants of Health:  . SDOH Screenings   Food Insecurity: No Food Insecurity (08/13/2023)  Housing: Unknown (08/13/2023)  Transportation Needs: No Transportation Needs (08/13/2023)  Utilities: Not At Risk (08/13/2023)  Social Connections: Socially Isolated (08/13/2023)  Tobacco Use: Medium Risk (02/02/2024)      Additional history:  {Additional history obtained from #EMS Previous hospital records reviewed  Lab Tests:  I Ordered, and personally interpreted labs.  The pertinent results include:   As per ED course  Imaging Studies:  I ordered imaging studies including *** I independently visualized and interpreted imaging which showed *** I agree with the radiologist interpretation  Cardiac Monitoring/ECG:  .The patient was maintained on a cardiac monitor.  I personally viewed and interpreted the cardiac monitored which showed an underlying rhythm of: ***  Medicines ordered and prescription drug management:  I ordered medication including  Medications  lactated ringers  infusion (has no administration in time range)  lactated ringers  bolus 1,000 mL (1,000 mLs Intravenous New Bag/Given 02/02/24 1930)    And  lactated ringers  bolus 1,000 mL (has no administration in time range)    And  lactated ringers  bolus 1,000 mL (has no administration in time range)  ceFEPIme  (MAXIPIME ) 2 g in sodium chloride  0.9 % 100 mL IVPB (has no administration in time  range)  metroNIDAZOLE  (FLAGYL ) IVPB 500 mg (has no administration in time range)  vancomycin  (VANCOREADY) IVPB 1500 mg/300 mL (has no administration in time range)  for *** Reevaluation of the patient after these medicines showed that the patient {resolved/improved/worsened:23923::improved} I have reviewed the patients home medicines and have made adjustments as needed  Test Considered:  .***  Critical Interventions:  .***  Consultations Obtained: ***  Problem List / ED Course:  .No diagnosis found.  MDM: ***   Dispostion:  After consideration of the diagnostic results and the patients response to treatment, I feel that the patent would benefit from ***.   {Document critical care time when appropriate  Document review of labs and clinical decision tools ie CHADS2VASC2, etc  Document your independent review of radiology images and any outside records  Document your discussion with family members, caretakers and with consultants  Document social determinants of health affecting pt's care  Document your decision making why or why not admission, treatments were needed:32947:::1}   Final diagnoses:  None    ED Discharge Orders     None

## 2024-02-02 NOTE — Sepsis Progress Note (Signed)
 Elink following code sepsis

## 2024-02-02 NOTE — H&P (Signed)
 History and Physical    Patient: [name] FMW:[id number] DOB: 1951-02-26 DOA: 02/02/2024 DOS: the patient was seen and examined on 02/02/2024 PCP: [name], DO  Patient coming from: {Point_of_Origin:[address]}  Chief Complaint:  Chief Complaint  Patient presents with   Code Sepsis   HPI: [name] is a 73 y.o. male with medical history significant of ***  Review of Systems: {ROS_Text:26778} Past Medical History:  Diagnosis Date   AAA (abdominal aortic aneurysm) (HCC)    a. 3.4 x 3.5 on scan 02/28/13.   Allergic reaction to contrast dye    Atherosclerosis    CAD (coronary artery disease)    a. stenting of RCA/LCx 2004. b. [place]  08/2013:  s/p DES to LCx for severe ISR.   Diabetes mellitus    Diverticulosis    Emphysema lung (HCC)    Emphysema of lung (HCC)    Encounter for therapeutic drug monitoring 06/06/2016   GERD (gastroesophageal reflux disease)    Hemorrhoids    Hepatic steatosis    History of radiation therapy 09/07/20-09/22/20   SBRT left lung ; Dr Lynwood Nasuti   HTN (hypertension)    Hyperlipidemia    Kidney stone    Lung cancer [facility]) dx'd 2004   chemo/xrt comp; tarceva  ongoing- January 2022 radiation   Stroke [facility])    Past Surgical History:  Procedure Laterality Date   ANGIOPLASTY / STENTING FEMORAL  08/14/2013   2004-groin   CATARACT EXTRACTION     COLONOSCOPY     LEFT HEART CATHETERIZATION WITH CORONARY ANGIOGRAM N/A 09/08/2013   Procedure: LEFT HEART CATHETERIZATION WITH CORONARY ANGIOGRAM;  Surgeon: Ozell JONETTA Fell, MD;  Location: [facility] CATH LAB;  Service: Cardiovascular;  Laterality: N/A;   left partial lobectomy     WISDOM TOOTH EXTRACTION     Social History:  reports that he quit smoking about 21 years ago. His smoking use included cigarettes. He has been exposed to tobacco smoke. He has never used smokeless tobacco. He reports that he does not drink alcohol and does not use drugs.  Allergies  Allergen Reactions   Iohexol  Other (See  Comments)     Code: HIVES, Desc: pt recieves 50mg  benadryl  1 hour prior to scan    Ivp Dye [Iodinated Contrast Media] Rash   Morphine And Codeine Anxiety    hallucinations   Other Rash    chlorascrub when starting IV   Povidone Iodine Rash    Other reaction(s): heated rash    Family History  Problem Relation Age of Onset   Heart disease Sister    Diabetes Sister        oral cancer   Ovarian cancer Sister    Colon cancer Neg Hx    Esophageal cancer Neg Hx    Rectal cancer Neg Hx    Stomach cancer Neg Hx     Prior to Admission medications   Medication Sig Start Date End Date Taking? Authorizing Provider  ACCU-CHEK AVIVA PLUS test strip 1 each by Other route daily. 06/08/20   [provider]  atorvastatin  (LIPITOR) 40 MG tablet Take 40 mg by mouth daily.    [provider]  Cholecalciferol (VITAMIN D3) 75 MCG (3000 UT) TABS Take 50 mcg by mouth daily. Takes 50 mcg/day    [provider]  cyanocobalamin 1000 MCG tablet Take 1,000 mcg by mouth daily.    [provider]  denosumab  (PROLIA ) 60 MG/ML SOSY injection Inject 60 mg into the skin every 6 (six) months.  06/25/23   [provider]  doxycycline  (MONODOX ) 100 MG capsule Take 1 capsule (100 mg total) by mouth daily. 11/21/23   Kozlow, Camellia PARAS, MD  erlotinib  (TARCEVA ) 100 MG tablet Take 1 tablet by mouth once daily on an empty stomach, 1 hour before meals or 2 hours after. 10/18/23   Sherrod Sherrod, MD  famotidine  (PEPCID ) 40 MG tablet TAKE 1 TABLET BY MOUTH 2 TIMES DAILY. PLEASE CALL [phone] SCHEDULE AN OFFICE VISIT FOR REFILLS. 10/30/23   Abran Norleen SAILOR, MD  glimepiride  (AMARYL ) 2 MG tablet Take 4 mg by mouth every morning. 07/05/21   [provider]  ipratropium (ATROVENT ) 0.06 % nasal spray PLACE 2 SPRAYS INTO BOTH NOSTRILS EVERY 6 (SIX) HOURS AS NEEDED FOR RHINITIS. 12/25/23   Kozlow, Camellia PARAS, MD  JARDIANCE  25 MG TABS tablet Take 25 mg by mouth daily. 06/29/21   [provider]  Lancets (FREESTYLE) lancets 1 each by Other route daily. 06/08/20   [provider]  levocetirizine (XYZAL ) 5 MG tablet Take 1 tablet (5 mg total) by mouth daily as needed (Can take an extra dose during flare ups.). 11/21/23   Kozlow, Camellia PARAS, MD  levothyroxine  (SYNTHROID ) 25 MCG tablet Take 25 mcg by mouth every morning. 05/27/22   [provider]  losartan  (COZAAR ) 25 MG tablet TAKE 1 TABLET BY MOUTH EVERY DAY FOR 90 DAYS    [provider]  loteprednol  (LOTEMAX ) 0.5 % ophthalmic suspension Place 1 drop into both eyes daily. 05/22/23   Kozlow, Camellia PARAS, MD  Magnesium  Glycinate 100 MG CAPS Take 2 capsules Orally nightly    [provider]  metoprolol  tartrate (LOPRESSOR ) 50 MG tablet TAKE 1 TABLET BY MOUTH TWICE A DAY 10/31/23   Wonda Sharper, MD  nitroGLYCERIN  (NITROSTAT ) 0.4 MG SL tablet Place 1 tablet (0.4 mg total) under the tongue every 5 (five) minutes as needed for chest pain (up to 3 doses). 09/09/13   Dunn, Dayna N, PA-C  nystatin-triamcinolone ointment (MYCOLOG) Apply 1 Application topically as needed. 04/25/22   [provider]  Olopatadine  HCl (PATADAY ) 0.2 % SOLN Place 1 drop into both eyes daily. 05/22/23   Kozlow, Camellia PARAS, MD  Olopatadine -Mometasone  (RYALTRIS ) [phone] MCG/ACT SUSP Place 2 sprays into the nose in the morning and at bedtime. 11/22/23   Ambs, Arlean HERO, FNP  Propylene Glycol, PF, (SYSTANE COMPLETE PF) 0.6 % SOLN Apply 1 drop to eye every 4 (four) hours as needed. 05/22/23   Kozlow, Camellia PARAS, MD  TRADJENTA  5 MG TABS tablet Take 5 mg by mouth daily. 06/01/20   [provider]  zaleplon  (SONATA ) 10 MG capsule Take 1 capsule (10 mg total) by mouth at bedtime. 08/14/23   Bryn Bernardino NOVAK, MD    Physical Exam: Vitals:   02/02/24 2015 02/02/24 2030 02/02/24 2045 02/02/24 2100  BP: 107/60 (!) 101/41 112/62 (!) 98/54  Pulse: 99 (!) 105 (!) 104 (!) 104  Resp: 12 19 15 14   Temp:      SpO2: 100% 100% 97% 100%  Weight:       Height:       *** Data Reviewed: {Tip this will not be part of the note when signed- Document your independent interpretation of telemetry tracing, EKG, lab, Radiology test or any other diagnostic tests. Add any new diagnostic test ordered today. (Optional):26781} {Results:26384}  Assessment and Plan: No notes have been filed under this hospital service. Service: Hospitalist     Advance Care Planning:   Code  Status: Prior ***  Consults: ***  Family Communication: ***  Severity of Illness: {Observation/Inpatient:21159}  Author: Glenys GORMAN Birk, MD 02/02/2024 10:43 PM  For on call review [email or web address].

## 2024-02-03 DIAGNOSIS — A419 Sepsis, unspecified organism: Secondary | ICD-10-CM

## 2024-02-03 DIAGNOSIS — R6520 Severe sepsis without septic shock: Secondary | ICD-10-CM | POA: Diagnosis not present

## 2024-02-03 DIAGNOSIS — E872 Acidosis, unspecified: Secondary | ICD-10-CM | POA: Diagnosis not present

## 2024-02-03 DIAGNOSIS — C3491 Malignant neoplasm of unspecified part of right bronchus or lung: Secondary | ICD-10-CM

## 2024-02-03 LAB — CBC
HCT: 37.6 % — ABNORMAL LOW (ref 39.0–52.0)
Hemoglobin: 13.6 g/dL (ref 13.0–17.0)
MCH: 34.3 pg — ABNORMAL HIGH (ref 26.0–34.0)
MCHC: 36.2 g/dL — ABNORMAL HIGH (ref 30.0–36.0)
MCV: 94.7 fL (ref 80.0–100.0)
Platelets: 136 10*3/uL — ABNORMAL LOW (ref 150–400)
RBC: 3.97 MIL/uL — ABNORMAL LOW (ref 4.22–5.81)
RDW: 14.1 % (ref 11.5–15.5)
WBC: 17.8 10*3/uL — ABNORMAL HIGH (ref 4.0–10.5)
nRBC: 0 % (ref 0.0–0.2)

## 2024-02-03 LAB — BLOOD CULTURE ID PANEL (REFLEXED) - BCID2

## 2024-02-03 LAB — RESPIRATORY PANEL BY PCR

## 2024-02-03 LAB — COMPREHENSIVE METABOLIC PANEL WITH GFR
ALT: 21 U/L (ref 0–44)
AST: 30 U/L (ref 15–41)
Albumin: 2.8 g/dL — ABNORMAL LOW (ref 3.5–5.0)
Alkaline Phosphatase: 42 U/L (ref 38–126)
Anion gap: 5 (ref 5–15)
BUN: 18 mg/dL (ref 8–23)
CO2: 22 mmol/L (ref 22–32)
Calcium: 7.9 mg/dL — ABNORMAL LOW (ref 8.9–10.3)
Chloride: 108 mmol/L (ref 98–111)
Creatinine, Ser: 1.79 mg/dL — ABNORMAL HIGH (ref 0.61–1.24)
GFR, Estimated: 39 mL/min — ABNORMAL LOW (ref >60)
Glucose, Bld: 117 mg/dL — ABNORMAL HIGH (ref 70–99)
Potassium: 3.9 mmol/L (ref 3.5–5.1)
Sodium: 135 mmol/L (ref 135–145)
Total Bilirubin: 1.7 mg/dL — ABNORMAL HIGH (ref 0.0–1.2)
Total Protein: 5.2 g/dL — ABNORMAL LOW (ref 6.5–8.1)

## 2024-02-03 LAB — PROCALCITONIN: Procalcitonin: 37.54 ng/mL

## 2024-02-03 LAB — TSH: TSH: 2.395 u[IU]/mL (ref 0.350–4.500)

## 2024-02-03 LAB — CBG MONITORING, ED
Glucose-Capillary: 110 mg/dL — ABNORMAL HIGH (ref 70–99)
Glucose-Capillary: 93 mg/dL (ref 70–99)

## 2024-02-03 LAB — GLUCOSE, CAPILLARY
Glucose-Capillary: 90 mg/dL (ref 70–99)
Glucose-Capillary: 90 mg/dL (ref 70–99)

## 2024-02-03 LAB — STREP PNEUMONIAE URINARY ANTIGEN: Strep Pneumo Urinary Antigen: NEGATIVE

## 2024-02-03 MED ORDER — HYDROMORPHONE HCL 1 MG/ML IJ SOLN
0.5000 mg | Freq: Once | INTRAMUSCULAR | Status: AC
  Administered 2024-02-03: 0.5 mg via INTRAVENOUS
  Filled 2024-02-03: qty 1

## 2024-02-03 MED ORDER — HYDROMORPHONE HCL 1 MG/ML IJ SOLN
0.5000 mg | INTRAMUSCULAR | Status: DC | PRN
  Administered 2024-02-03: 0.5 mg via INTRAVENOUS
  Filled 2024-02-03: qty 0.5

## 2024-02-03 MED ORDER — FLUTICASONE PROPIONATE 50 MCG/ACT NA SUSP
2.0000 | Freq: Every day | NASAL | Status: DC
  Administered 2024-02-04 – 2024-02-05 (×2): 2 via NASAL
  Filled 2024-02-03: qty 16

## 2024-02-03 MED ORDER — CYCLOBENZAPRINE HCL 5 MG PO TABS
10.0000 mg | ORAL_TABLET | Freq: Three times a day (TID) | ORAL | Status: DC | PRN
  Administered 2024-02-03 – 2024-02-04 (×2): 10 mg via ORAL
  Filled 2024-02-03 (×2): qty 2

## 2024-02-03 MED ORDER — AZELASTINE HCL 0.1 % NA SOLN
2.0000 | Freq: Every day | NASAL | Status: DC
  Administered 2024-02-04 – 2024-02-05 (×2): 2 via NASAL
  Filled 2024-02-03: qty 30

## 2024-02-03 NOTE — Assessment & Plan Note (Addendum)
 Currently on erlotinib  - follows with Dr. Sherrod - follow-up imaging in July 2025 planned

## 2024-02-03 NOTE — Plan of Care (Signed)

## 2024-02-03 NOTE — Assessment & Plan Note (Addendum)
 No significant abdominal pain Last measuring 3.9 cm in 2022 Continue to monitor Consider repeat CT scan as an outpatient

## 2024-02-03 NOTE — Hospital Course (Signed)
 Patient is a 76 with history of AAA, PVD, CAD status post cardiac cath with stenting last in 2015, GERD, COPD, CHF, CKD, T2DM, HLD, HTN, history of metastatic lung cancer who was in his usual state of health at home until approximately 4:00 this afternoon.  He reports developing shaking chills/rigors at home and calling EMS.  There he was found to have a temp of 103.  He was initially hypertensive by the time he got to the ER he was hypotensive.  His lactic acid was initially 3.8 and he had an elevated WBC at 11.6.  He was started on a sepsis protocol given IV fluid resuscitation, started on broad-spectrum antibiotics and workup persisted.  He had acute on chronic renal insufficiency had a chest x-ray that was suggestive of pneumonia.  His urinalysis was essentially negative.  Patient denies any significant cough, sore throat, dysuria, GI symptoms.  He is on a chemotherapeutic agent for recurrent lung cancer and has been stable on this for a while. His cancer treatment involves a diagnosis in 2004.  He had neoadjuvant chemotherapy and XRT as well as lung resection on the left.  He has been on Tarceva  for the last 33 months.

## 2024-02-03 NOTE — ED Provider Notes (Signed)
 I provided a substantive portion of the care of this patient.  I personally made/approved the management plan for this patient and take responsibility for the patient management.  EKG Interpretation Date/Time:  Saturday February 02 2024 19:33:51 EDT Ventricular Rate:  110 PR Interval:  182 QRS Duration:  96 QT Interval:  317 QTC Calculation: 429 R Axis:   -13  Text Interpretation: duplicate discard Confirmed by Randol Simmonds [phone]) on 02/02/2024 9:12:42 PM    Randol Simmonds, MD 02/03/24 0930

## 2024-02-03 NOTE — Progress Notes (Signed)
 Progress Note    [name]   FMW:[id number]  DOB: 1950-12-12  DOA: 02/02/2024     1 PCP: [name], DO  Initial CC: Fever, chills  Hospital Course: [name] is a 73 y.o. male with PMH left metastatic NSCLC (dx 2004) s/p LUL wedge resection/chemoradiation (and now on erlotinib ), CVA, CAD, DMII, emphysema, GERD, HTN, HLD Who presented with chills/rigors, fever. Symptoms started abruptly on Saturday.  No obvious recent sick contacts but did accompany his wife to a doctor appointment around Thursday.  CXR showed patchy opacities near the right lung base concerning for pneumonia. He was found to be febrile, 103.1 degrees, tachypneic, tachycardic, and hypotensive.  He maintained adequate saturations on room air. WBC was also elevated, 11.6 initially which further up trended and procalcitonin 37.54. COVID, RSV, flu, RVP swabs were all negative.  He was started on antibiotics and admitted for further monitoring.  Interval History:  Resting in bed in the ER this morning.  Mostly complaining of back pain from the bed but in general does not appear comfortable due to some shortness of breath still. We discussed a trial of Dilaudid  to see if helps with some of his work of breathing and pain. Recommended he remain in the hospital for ongoing IV antibiotics and monitoring.  Assessment and Plan: * Severe sepsis (HCC) - Febrile, tachycardia, tachypnea, leukocytosis, lactic acidosis.  Suspected pulmonary source with right lower lobe pneumonia -Does have some risk for MDRO, however will continue on Rocephin  and azithromycin  for now.  If does not respond or worsens, will broaden - CURB-65 is 3 pts so will keep inpatient for at least 48 hrs on IV abx  - COVID, flu, RSV, RVP all negative - Strep pneumo urinary antigen negative.  Follow-up Legionella - Follow-up blood cultures  Lactic acidosis Improved with IV fluids  Adenocarcinoma of lung, right (HCC) Currently on erlotinib  -  follows with Dr. Sherrod - follow-up imaging in July 2025 planned  Type 2 diabetes mellitus with other specified complication (HCC) On Jardiance , Amaryl , Tradjenta  Sliding scale insulin   Hypertension Usually on losartan  and metoprolol  Holding this due to hypotension  Chronic kidney disease, stage 3b (HCC) Baseline creatinine is usually 1.4-1.5 Creatinine on admission 1.79 likely related to dehydration and sepsis IV fluid hydration Avoid nephrotoxic agents Trend  Coronary artery disease involving native coronary artery of native heart without angina pectoris Status post stenting x 2 Currently on aspirin  only Continue Lipitor  Gastro-esophageal reflux disease without esophagitis On H2 blocker  Hyperlipidemia Continue atorvastatin   Hypothyroidism Continued levothyroxine  Check TSH  Abdominal aortic aneurysm without rupture (HCC) No significant abdominal pain Last measuring 3.9 cm in 2022 Continue to monitor Consider repeat CT scan as an outpatient   Old records reviewed in assessment of this patient  Antimicrobials: Azithromycin  02/02/2024 >> current Cefepime  and flagyl  02/02/2024 x 1 Rocephin  02/03/2024 >> current  DVT prophylaxis:  SCDs Start: 02/02/24 2250   Code Status:   Code Status: Full Code  Mobility Assessment (Last 72 Hours)     Mobility Assessment     Row Name 02/03/24 04:25:41           Does patient have an order for bedrest or is patient medically unstable No - Continue assessment       What is the highest level of mobility based on the progressive mobility assessment? Level 5 (Walks with assist in room/hall) - Balance while stepping forward/back and can walk in room with assist - Complete  Barriers to discharge: none Disposition Plan:  Home HH orders placed: n/a Status is: Inpt  Objective: Blood pressure 102/62, pulse 91, temperature 98.6 F (37 C), temperature source Oral, resp. rate 14, height 6' 1 (1.854 m), weight 86.2 kg,  SpO2 92%.  Examination:  Physical Exam Constitutional:      Comments: Pleasant elderly gentleman sitting in bed appearing uncomfortable but in no distress.  Slightly increased work of breathing and tachypneic at times.  No accessory muscle use  HENT:     Head: Normocephalic and atraumatic.     Mouth/Throat:     Mouth: Mucous membranes are moist.   Eyes:     Extraocular Movements: Extraocular movements intact.    Cardiovascular:     Rate and Rhythm: Normal rate and regular rhythm.  Pulmonary:     Effort: Pulmonary effort is normal. No respiratory distress.     Breath sounds: Rhonchi (Worse in right base) present. No wheezing.  Abdominal:     General: Bowel sounds are normal. There is no distension.     Palpations: Abdomen is soft.     Tenderness: There is no abdominal tenderness.   Musculoskeletal:        General: Normal range of motion.     Cervical back: Normal range of motion and neck supple.   Skin:    General: Skin is warm and dry.   Neurological:     General: No focal deficit present.   Psychiatric:        Mood and Affect: Mood normal.        Behavior: Behavior normal.      Consultants:    Procedures:    Data Reviewed: Results for orders placed or performed during the hospital encounter of 02/02/24 (from the past 24 hours)  Resp panel by RT-PCR (RSV, Flu A&B, Covid) Anterior Nasal Swab     Status: None   Collection Time: 02/02/24  7:23 PM   Specimen: Anterior Nasal Swab  Result Value Ref Range   SARS Coronavirus 2 by RT PCR NEGATIVE NEGATIVE   Influenza A by PCR NEGATIVE NEGATIVE   Influenza B by PCR NEGATIVE NEGATIVE   Resp Syncytial Virus by PCR NEGATIVE NEGATIVE  Comprehensive metabolic panel     Status: Abnormal   Collection Time: 02/02/24  7:23 PM  Result Value Ref Range   Sodium 137 135 - 145 mmol/L   Potassium 3.5 3.5 - 5.1 mmol/L   Chloride 104 98 - 111 mmol/L   CO2 21 (L) 22 - 32 mmol/L   Glucose, Bld 179 (H) 70 - 99 mg/dL   BUN 21 8 - 23  mg/dL   Creatinine, Ser 8.20 (H) 0.61 - 1.24 mg/dL   Calcium  8.9 8.9 - 10.3 mg/dL   Total Protein 5.9 (L) 6.5 - 8.1 g/dL   Albumin 3.1 (L) 3.5 - 5.0 g/dL   AST 29 15 - 41 U/L   ALT 23 0 - 44 U/L   Alkaline Phosphatase 63 38 - 126 U/L   Total Bilirubin 1.6 (H) 0.0 - 1.2 mg/dL   GFR, Estimated 39 (L) >60 mL/min   Anion gap 12 5 - 15  CBC with Differential     Status: Abnormal   Collection Time: 02/02/24  7:23 PM  Result Value Ref Range   WBC 11.6 (H) 4.0 - 10.5 K/uL   RBC 4.37 4.22 - 5.81 MIL/uL   Hemoglobin 14.5 13.0 - 17.0 g/dL   HCT 59.1 60.9 - 47.9 %   MCV 93.4  80.0 - 100.0 fL   MCH 33.2 26.0 - 34.0 pg   MCHC 35.5 30.0 - 36.0 g/dL   RDW 85.9 88.4 - 84.4 %   Platelets 175 150 - 400 K/uL   nRBC 0.0 0.0 - 0.2 %   Neutrophils Relative % 95 %   Neutro Abs 11.0 (H) 1.7 - 7.7 K/uL   Lymphocytes Relative 3 %   Lymphs Abs 0.4 (L) 0.7 - 4.0 K/uL   Monocytes Relative 1 %   Monocytes Absolute 0.1 0.1 - 1.0 K/uL   Eosinophils Relative 0 %   Eosinophils Absolute 0.0 0.0 - 0.5 K/uL   Basophils Relative 0 %   Basophils Absolute 0.0 0.0 - 0.1 K/uL   Immature Granulocytes 1 %   Abs Immature Granulocytes 0.06 0.00 - 0.07 K/uL  Blood Culture (routine x 2)     Status: None (Preliminary result)   Collection Time: 02/02/24  7:23 PM   Specimen: BLOOD  Result Value Ref Range   Specimen Description BLOOD SITE NOT SPECIFIED    Special Requests      BOTTLES DRAWN AEROBIC AND ANAEROBIC Blood Culture results may not be optimal due to an inadequate volume of blood received in culture bottles   Culture      NO GROWTH < 12 HOURS Performed at [facility], [address], [place], [place] [address]    Report Status PENDING   Lipase, blood     Status: Abnormal   Collection Time: 02/02/24  7:23 PM  Result Value Ref Range   Lipase 58 (H) 11 - 51 U/L  Troponin I (High Sensitivity)     Status: Abnormal   Collection Time: 02/02/24  7:23 PM  Result Value Ref Range   Troponin I (High  Sensitivity) 29 (H) <18 ng/L  Urinalysis, w/ Reflex to Culture (Infection Suspected) -Urine, Clean Catch     Status: Abnormal   Collection Time: 02/02/24  7:25 PM  Result Value Ref Range   Specimen Source URINE, CLEAN CATCH    Color, Urine YELLOW YELLOW   APPearance CLEAR CLEAR   Specific Gravity, Urine 1.022 1.005 - 1.030   pH 5.0 5.0 - 8.0   Glucose, UA >=500 (A) NEGATIVE mg/dL   Hgb urine dipstick SMALL (A) NEGATIVE   Bilirubin Urine NEGATIVE NEGATIVE   Ketones, ur NEGATIVE NEGATIVE mg/dL   Protein, ur NEGATIVE NEGATIVE mg/dL   Nitrite NEGATIVE NEGATIVE   Leukocytes,Ua NEGATIVE NEGATIVE   RBC / HPF 0-5 0 - 5 RBC/hpf   WBC, UA 0-5 0 - 5 WBC/hpf   Bacteria, UA NONE SEEN NONE SEEN   Squamous Epithelial / HPF 0-5 0 - 5 /HPF   Mucus PRESENT   I-Stat Lactic Acid, ED     Status: Abnormal   Collection Time: 02/02/24  7:28 PM  Result Value Ref Range   Lactic Acid, Venous 3.8 (HH) 0.5 - 1.9 mmol/L   Comment NOTIFIED PHYSICIAN   I-Stat Chem 8, ED     Status: Abnormal   Collection Time: 02/02/24  7:28 PM  Result Value Ref Range   Sodium 138 135 - 145 mmol/L   Potassium 3.5 3.5 - 5.1 mmol/L   Chloride 104 98 - 111 mmol/L   BUN 22 8 - 23 mg/dL   Creatinine, Ser 8.29 (H) 0.61 - 1.24 mg/dL   Glucose, Bld 823 (H) 70 - 99 mg/dL   Calcium , Ion 1.11 (L) 1.15 - 1.40 mmol/L   TCO2 19 (L) 22 - 32 mmol/L  Hemoglobin 13.6 13.0 - 17.0 g/dL   HCT 59.9 60.9 - 47.9 %  I-Stat venous blood gas, ED ([facility])     Status: Abnormal   Collection Time: 02/02/24  7:28 PM  Result Value Ref Range   pH, Ven 7.462 (H) 7.25 - 7.43   pCO2, Ven 28.8 (L) 44 - 60 mmHg   pO2, Ven 44 32 - 45 mmHg   Bicarbonate 20.6 20.0 - 28.0 mmol/L   TCO2 21 (L) 22 - 32 mmol/L   O2 Saturation 83 %   Acid-base deficit 2.0 0.0 - 2.0 mmol/L   Sodium 139 135 - 145 mmol/L   Potassium 3.6 3.5 - 5.1 mmol/L   Calcium , Ion 1.07 (L) 1.15 - 1.40 mmol/L   HCT 40.0 39.0 - 52.0 %   Hemoglobin 13.6 13.0 - 17.0 g/dL   Sample type VENOUS    Blood Culture (routine x 2)     Status: None (Preliminary result)   Collection Time: 02/02/24  7:34 PM   Specimen: BLOOD  Result Value Ref Range   Specimen Description BLOOD SITE NOT SPECIFIED    Special Requests      BOTTLES DRAWN AEROBIC AND ANAEROBIC Blood Culture results may not be optimal due to an inadequate volume of blood received in culture bottles   Culture      NO GROWTH < 12 HOURS Performed at [facility], [address] [place], [place] [address]    Report Status PENDING   Protime-INR     Status: None   Collection Time: 02/02/24  8:21 PM  Result Value Ref Range   Prothrombin Time 14.4 11.4 - 15.2 seconds   INR 1.1 0.8 - 1.2  Troponin I (High Sensitivity)     Status: Abnormal   Collection Time: 02/02/24  9:10 PM  Result Value Ref Range   Troponin I (High Sensitivity) 48 (H) <18 ng/L  I-Stat Lactic Acid, ED     Status: Abnormal   Collection Time: 02/02/24  9:24 PM  Result Value Ref Range   Lactic Acid, Venous 2.4 (HH) 0.5 - 1.9 mmol/L   Comment NOTIFIED PHYSICIAN   Strep pneumoniae urinary antigen     Status: None   Collection Time: 02/03/24 12:27 AM  Result Value Ref Range   Strep Pneumo Urinary Antigen NEGATIVE NEGATIVE  Procalcitonin     Status: None   Collection Time: 02/03/24  2:55 AM  Result Value Ref Range   Procalcitonin 37.54 ng/mL  Comprehensive metabolic panel     Status: Abnormal   Collection Time: 02/03/24  2:55 AM  Result Value Ref Range   Sodium 135 135 - 145 mmol/L   Potassium 3.9 3.5 - 5.1 mmol/L   Chloride 108 98 - 111 mmol/L   CO2 22 22 - 32 mmol/L   Glucose, Bld 117 (H) 70 - 99 mg/dL   BUN 18 8 - 23 mg/dL   Creatinine, Ser 8.20 (H) 0.61 - 1.24 mg/dL   Calcium  7.9 (L) 8.9 - 10.3 mg/dL   Total Protein 5.2 (L) 6.5 - 8.1 g/dL   Albumin 2.8 (L) 3.5 - 5.0 g/dL   AST 30 15 - 41 U/L   ALT 21 0 - 44 U/L   Alkaline Phosphatase 42 38 - 126 U/L   Total Bilirubin 1.7 (H) 0.0 - 1.2 mg/dL   GFR, Estimated 39 (L) >60 mL/min   Anion gap 5 5  - 15  CBC     Status: Abnormal   Collection Time: 02/03/24  2:55 AM  Result Value Ref Range   WBC 17.8 (H) 4.0 - 10.5 K/uL   RBC 3.97 (L) 4.22 - 5.81 MIL/uL   Hemoglobin 13.6 13.0 - 17.0 g/dL   HCT 62.3 (L) 60.9 - 47.9 %   MCV 94.7 80.0 - 100.0 fL   MCH 34.3 (H) 26.0 - 34.0 pg   MCHC 36.2 (H) 30.0 - 36.0 g/dL   RDW 85.8 88.4 - 84.4 %   Platelets 136 (L) 150 - 400 K/uL   nRBC 0.0 0.0 - 0.2 %  CBG monitoring, ED     Status: Abnormal   Collection Time: 02/03/24  7:07 AM  Result Value Ref Range   Glucose-Capillary 110 (H) 70 - 99 mg/dL  Respiratory (~79 pathogens) panel by PCR     Status: None   Collection Time: 02/03/24  8:19 AM   Specimen: Nasopharyngeal Swab; Respiratory  Result Value Ref Range   Adenovirus NOT DETECTED NOT DETECTED   Coronavirus 229E NOT DETECTED NOT DETECTED   Coronavirus HKU1 NOT DETECTED NOT DETECTED   Coronavirus NL63 NOT DETECTED NOT DETECTED   Coronavirus OC43 NOT DETECTED NOT DETECTED   Metapneumovirus NOT DETECTED NOT DETECTED   Rhinovirus / Enterovirus NOT DETECTED NOT DETECTED   Influenza A NOT DETECTED NOT DETECTED   Influenza B NOT DETECTED NOT DETECTED   Parainfluenza Virus 1 NOT DETECTED NOT DETECTED   Parainfluenza Virus 2 NOT DETECTED NOT DETECTED   Parainfluenza Virus 3 NOT DETECTED NOT DETECTED   Parainfluenza Virus 4 NOT DETECTED NOT DETECTED   Respiratory Syncytial Virus NOT DETECTED NOT DETECTED   Bordetella pertussis NOT DETECTED NOT DETECTED   Bordetella Parapertussis NOT DETECTED NOT DETECTED   Chlamydophila pneumoniae NOT DETECTED NOT DETECTED   Mycoplasma pneumoniae NOT DETECTED NOT DETECTED  CBG monitoring, ED     Status: None   Collection Time: 02/03/24 11:40 AM  Result Value Ref Range   Glucose-Capillary 93 70 - 99 mg/dL    I have reviewed pertinent nursing notes, vitals, labs, and images as necessary. I have ordered labwork to follow up on as indicated.  I have reviewed the last notes from staff over past 24 hours. I have  discussed patient's care plan and test results with nursing staff, CM/SW, and other staff as appropriate.  Time spent: Greater than 50% of the 55 minute visit was spent in counseling/coordination of care for the patient as laid out in the A&P.   LOS: 1 day   Alm Apo, MD [facility] 02/03/2024, 12:03 PM

## 2024-02-03 NOTE — Assessment & Plan Note (Signed)
 On Jardiance , Amaryl , Tradjenta  Sliding scale insulin 

## 2024-02-03 NOTE — Assessment & Plan Note (Signed)
 Continue atorvastatin 

## 2024-02-03 NOTE — Assessment & Plan Note (Addendum)
-   Febrile, tachycardia, tachypnea, leukocytosis, lactic acidosis.  Suspected pulmonary source with right lower lobe pneumonia -Does have some risk for MDRO, however will continue on Rocephin  and azithromycin  for now.  If does not respond or worsens, will broaden - CURB-65 is 3 pts so will keep inpatient for at least 48 hrs on IV abx  - COVID, flu, RSV, RVP all negative - Strep pneumo urinary antigen negative.  Follow-up Legionella - Follow-up blood cultures

## 2024-02-03 NOTE — Assessment & Plan Note (Signed)
 On H2 blocker.

## 2024-02-03 NOTE — Hospital Course (Signed)
 Mr. Salinger II is a 73 y.o. male with PMH left metastatic NSCLC (dx 2004) s/p LUL wedge resection/chemoradiation (and now on erlotinib ), CVA, CAD, DMII, emphysema, GERD, HTN, HLD Who presented with chills/rigors, fever. Symptoms started abruptly on Saturday.  No obvious recent sick contacts but did accompany his wife to a doctor appointment around Thursday.  CXR showed patchy opacities near the right lung base concerning for pneumonia. He was found to be febrile, 103.1 degrees, tachypneic, tachycardic, and hypotensive.  He maintained adequate saturations on room air. WBC was also elevated, 11.6 initially which further up trended and procalcitonin 37.54. COVID, RSV, flu, RVP swabs were all negative.  He was started on antibiotics and admitted for further monitoring.

## 2024-02-03 NOTE — Assessment & Plan Note (Signed)
 Status post stenting x 2 Currently on aspirin  only Continue Lipitor

## 2024-02-03 NOTE — Progress Notes (Signed)
 PHARMACY - PHYSICIAN COMMUNICATION CRITICAL VALUE ALERT - BLOOD CULTURE IDENTIFICATION (BCID)  [name] is an 73 y.o. male who presented to [facility] on 02/02/2024 with a chief complaint of chills/rigors and fever  Assessment:  1/4 blood cultures with staph epi (no resistance)  Name of physician (or Provider) Contacted: Dr. Lawence  Current antibiotics: Ceftriaxone  2g IV every 24h + Azithromycin  500mg  IV q24h  Changes to prescribed antibiotics recommended:   -Contaminate  -Continue current course for pneumonia   Results for orders placed or performed during the hospital encounter of 02/02/24  Blood Culture ID Panel (Reflexed) (Collected: 02/02/2024  7:23 PM)  Result Value Ref Range   Enterococcus faecalis NOT DETECTED NOT DETECTED   Enterococcus Faecium NOT DETECTED NOT DETECTED   Listeria monocytogenes NOT DETECTED NOT DETECTED   Staphylococcus species DETECTED (A) NOT DETECTED   Staphylococcus aureus (BCID) NOT DETECTED NOT DETECTED   Staphylococcus epidermidis DETECTED (A) NOT DETECTED   Staphylococcus lugdunensis NOT DETECTED NOT DETECTED   Streptococcus species NOT DETECTED NOT DETECTED   Streptococcus agalactiae NOT DETECTED NOT DETECTED   Streptococcus pneumoniae NOT DETECTED NOT DETECTED   Streptococcus pyogenes NOT DETECTED NOT DETECTED   A.calcoaceticus-baumannii NOT DETECTED NOT DETECTED   Bacteroides fragilis NOT DETECTED NOT DETECTED   Enterobacterales NOT DETECTED NOT DETECTED   Enterobacter cloacae complex NOT DETECTED NOT DETECTED   Escherichia coli NOT DETECTED NOT DETECTED   Klebsiella aerogenes NOT DETECTED NOT DETECTED   Klebsiella oxytoca NOT DETECTED NOT DETECTED   Klebsiella pneumoniae NOT DETECTED NOT DETECTED   Proteus species NOT DETECTED NOT DETECTED   Salmonella species NOT DETECTED NOT DETECTED   Serratia marcescens NOT DETECTED NOT DETECTED   Haemophilus influenzae NOT DETECTED NOT DETECTED   Neisseria meningitidis NOT DETECTED NOT  DETECTED   Pseudomonas aeruginosa NOT DETECTED NOT DETECTED   Stenotrophomonas maltophilia NOT DETECTED NOT DETECTED   Candida albicans NOT DETECTED NOT DETECTED   Candida auris NOT DETECTED NOT DETECTED   Candida glabrata NOT DETECTED NOT DETECTED   Candida krusei NOT DETECTED NOT DETECTED   Candida parapsilosis NOT DETECTED NOT DETECTED   Candida tropicalis NOT DETECTED NOT DETECTED   Cryptococcus neoformans/gattii NOT DETECTED NOT DETECTED   Methicillin resistance mecA/C NOT DETECTED NOT DETECTED    Lynwood Poplar, PharmD, BCPS Clinical Pharmacist 02/03/2024 10:58 PM

## 2024-02-03 NOTE — Assessment & Plan Note (Addendum)
 Baseline creatinine is usually 1.4-1.5 Creatinine on admission 1.79 likely related to dehydration and sepsis IV fluid hydration Avoid nephrotoxic agents Trend

## 2024-02-03 NOTE — Assessment & Plan Note (Signed)
 Improved with IV fluids

## 2024-02-03 NOTE — Assessment & Plan Note (Signed)
 Usually on losartan  and metoprolol  Holding this due to hypotension

## 2024-02-03 NOTE — Assessment & Plan Note (Signed)
 Continued levothyroxine  Check TSH

## 2024-02-04 DIAGNOSIS — C3491 Malignant neoplasm of unspecified part of right bronchus or lung: Secondary | ICD-10-CM | POA: Diagnosis not present

## 2024-02-04 DIAGNOSIS — E1169 Type 2 diabetes mellitus with other specified complication: Secondary | ICD-10-CM

## 2024-02-04 DIAGNOSIS — K219 Gastro-esophageal reflux disease without esophagitis: Secondary | ICD-10-CM | POA: Diagnosis not present

## 2024-02-04 DIAGNOSIS — A419 Sepsis, unspecified organism: Secondary | ICD-10-CM | POA: Diagnosis not present

## 2024-02-04 LAB — GLUCOSE, CAPILLARY
Glucose-Capillary: 58 mg/dL — ABNORMAL LOW (ref 70–99)
Glucose-Capillary: 63 mg/dL — ABNORMAL LOW (ref 70–99)
Glucose-Capillary: 81 mg/dL (ref 70–99)
Glucose-Capillary: 126 mg/dL — ABNORMAL HIGH (ref 70–99)
Glucose-Capillary: 261 mg/dL — ABNORMAL HIGH (ref 70–99)
Glucose-Capillary: 155 mg/dL — ABNORMAL HIGH (ref 70–99)

## 2024-02-04 LAB — BASIC METABOLIC PANEL WITH GFR
Anion gap: 11 (ref 5–15)
BUN: 17 mg/dL (ref 8–23)
CO2: 19 mmol/L — ABNORMAL LOW (ref 22–32)
Calcium: 8.3 mg/dL — ABNORMAL LOW (ref 8.9–10.3)
Chloride: 105 mmol/L (ref 98–111)
Creatinine, Ser: 1.36 mg/dL — ABNORMAL HIGH (ref 0.61–1.24)
GFR, Estimated: 55 mL/min — ABNORMAL LOW (ref >60)
Glucose, Bld: 61 mg/dL — ABNORMAL LOW (ref 70–99)
Potassium: 3.4 mmol/L — ABNORMAL LOW (ref 3.5–5.1)
Sodium: 135 mmol/L (ref 135–145)

## 2024-02-04 LAB — LEGIONELLA PNEUMOPHILA SEROGP 1 UR AG: L. pneumophila Serogp 1 Ur Ag: NEGATIVE

## 2024-02-04 LAB — PROCALCITONIN: Procalcitonin: 26.29 ng/mL

## 2024-02-04 LAB — MAGNESIUM: Magnesium: 1.5 mg/dL — ABNORMAL LOW (ref 1.7–2.4)

## 2024-02-04 MED ORDER — INSULIN ASPART 100 UNIT/ML IJ SOLN
0.0000 [IU] | Freq: Three times a day (TID) | INTRAMUSCULAR | Status: DC
  Administered 2024-02-04: 3 [IU] via SUBCUTANEOUS

## 2024-02-04 MED ORDER — ENOXAPARIN SODIUM 40 MG/0.4ML IJ SOSY
40.0000 mg | PREFILLED_SYRINGE | Freq: Every day | INTRAMUSCULAR | Status: DC
  Administered 2024-02-04 – 2024-02-05 (×2): 40 mg via SUBCUTANEOUS
  Filled 2024-02-04 (×2): qty 0.4

## 2024-02-04 MED ORDER — ALBUTEROL SULFATE (2.5 MG/3ML) 0.083% IN NEBU: 2.5000 mg | INHALATION_SOLUTION | Freq: Four times a day (QID) | RESPIRATORY_TRACT | Status: DC | PRN

## 2024-02-04 MED ORDER — POTASSIUM CHLORIDE CRYS ER 20 MEQ PO TBCR
40.0000 meq | EXTENDED_RELEASE_TABLET | Freq: Once | ORAL | Status: AC
  Administered 2024-02-04: 40 meq via ORAL
  Filled 2024-02-04: qty 2

## 2024-02-04 MED ORDER — MAGNESIUM SULFATE 4 GM/100ML IV SOLN
4.0000 g | Freq: Once | INTRAVENOUS | Status: AC
  Administered 2024-02-04: 4 g via INTRAVENOUS
  Filled 2024-02-04: qty 100

## 2024-02-04 NOTE — Progress Notes (Signed)
   02/04/24 1103  TOC Brief Assessment  Insurance and Status Reviewed [facility] Medicare)  Patient has primary care physician Yes Stevenson, [place], DO)  Home environment has been reviewed From Home  Prior level of function: Independent  Prior/Current Home Services No current home services  Social Drivers of Health Review SDOH reviewed no interventions necessary  Readmission risk has been reviewed Yes (26%)  Transition of care needs no transition of care needs at this time   Please place [facility] consult for any DC needs

## 2024-02-04 NOTE — Progress Notes (Signed)
   02/04/24 1015  Mobility  Activity Ambulated independently in hallway  Level of Assistance Standby assist, set-up cues, supervision of patient - no hands on  Assistive Device None  Distance Ambulated (ft) 400 ft  Activity Response Tolerated fair  Mobility Referral Yes  Mobility visit 1 Mobility  Mobility Specialist Start Time (ACUTE ONLY) 1015  Mobility Specialist Stop Time (ACUTE ONLY) 1036  Mobility Specialist Time Calculation (min) (ACUTE ONLY) 21 min   Mobility Specialist: Progress Note- Visits:2  Pre-Mobility:      HR 94, SpO2 98% RA Post-Mobility:    HR 91, SpO2 96% RA  Pt agreeable to mobility session - received in bed. C/o L sided lower back pain and BLE weakness. Returned to bed with all needs met - call bell within reach. Wife Present.   ____________________________________________________________________________  Pre-Mobility:      HR 106 During Mobility: 122-133 Post-Mobility:    HR 106  Pt agreeable to mobility session - received in bed. C/o same sx as previous session. Returned to bed with all needs met - call bell within reach. Wife Present.   Virgle Boards, BS Mobility Specialist Please contact via SecureChat or  Rehab office at [phone].

## 2024-02-04 NOTE — Plan of Care (Signed)
  Problem: Education: Goal: Ability to describe self-care measures that may prevent or decrease complications (Diabetes Survival Skills Education) will improve Outcome: Progressing   Problem: Coping: Goal: Ability to adjust to condition or change in health will improve Outcome: Progressing   Problem: Fluid Volume: Goal: Ability to maintain a balanced intake and output will improve Outcome: Progressing   Problem: Health Behavior/Discharge Planning: Goal: Ability to identify and utilize available resources and services will improve Outcome: Progressing   Problem: Nutritional: Goal: Maintenance of adequate nutrition will improve Outcome: Progressing

## 2024-02-04 NOTE — Evaluation (Signed)
 Physical Therapy Brief Evaluation and Discharge Note Patient Details Name: [name] MRN: [id number] DOB: February 04, 1951 Today's Date: 02/04/2024   History of Present Illness  Patient is a 73 y.o. male presented with fever/chills-found to have right PNA on further evaluation.  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 Pt today was able to ambulate in hallway and navigate stairs independently. PTA pt was fully independent. Pt presents at or near baseline mobility. Pt has no further acute PT needs and will be signing off. Re consult PT if mobility status changes. Pt would benefit from continued mobility with mobility specialist during acute stay.      PT Assessment Patient does not need any further PT services  Assistance Needed at Discharge  PRN    Equipment Recommendations None recommended by PT  Recommendations for Other Services       Precautions/Restrictions Precautions Precautions: Fall Recall of Precautions/Restrictions: Intact Restrictions Weight Bearing Restrictions Per Provider Order: No        Mobility  Bed Mobility   Supine/Sidelying to sit: Independent Sit to supine/sidelying: Independent    Transfers Overall transfer level: Independent Equipment used: None                    Ambulation/Gait Ambulation/Gait assistance: Independent Gait Distance (Feet): 250 Feet Assistive device: None Gait Pattern/deviations: WFL(Within Functional Limits) Gait Speed: Pace WFL General Gait Details: No LOB noted.  Home Activity Instructions    Stairs Stairs: Yes Stairs assistance: Independent Stair Management: Two rails, Alternating pattern, Forwards Number of Stairs: 2 General stair comments: no LOB noted.  Modified Rankin (Stroke Patients Only)        Balance Overall balance assessment: Mild deficits  observed, not formally tested                        Pertinent Vitals/Pain PT - Brief Vital Signs All Vital Signs Stable: Yes Pain Assessment Pain Assessment: 0-10 Pain Score: 5  Pain Location: Back Pain Descriptors / Indicators: Aching, Dull Pain Intervention(s): Monitored during session     Home Living Family/patient expects to be discharged to:: Private residence Living Arrangements: Spouse/significant other Available Help at Discharge: Family;Available 24 hours/day Home Environment: Stairs to enter;Rail - right;Rail - left  Stairs-Number of Steps: 2 Home Equipment: Rolling Walker (2 wheels);Cane - single point;Shower seat;BSC/3in1        Prior Function Level of Independence: Independent      UE/LE Assessment   UE ROM/Strength/Tone/Coordination: WFL    LE ROM/Strength/Tone/Coordination: [facility]      Communication   Communication Communication: No apparent difficulties     Cognition Overall Cognitive Status: Appears within functional limits for tasks assessed/performed       General Comments General comments (skin integrity, edema, etc.): VSS    Exercises     Assessment/Plan    PT Problem List         PT Visit Diagnosis Other abnormalities of gait and mobility (R26.89)    No Skilled PT Patient at baseline level of functioning;Patient is independent with all acitivity/mobility   Co-evaluation                AMPAC 6 Clicks Help needed turning from your back to your side while in a flat bed without using bedrails?: None Help needed moving from lying on your back to sitting on the side of a flat  bed without using bedrails?: None Help needed moving to and from a bed to a chair (including a wheelchair)?: None Help needed standing up from a chair using your arms (e.g., wheelchair or bedside chair)?: None Help needed to walk in hospital room?: None Help needed climbing 3-5 steps with a railing? : None 6 Click Score: 24      End of  Session Equipment Utilized During Treatment: Gait belt Activity Tolerance: Patient tolerated treatment well Patient left: in bed;with call bell/phone within reach;with family/visitor present Nurse Communication: Mobility status PT Visit Diagnosis: Other abnormalities of gait and mobility (R26.89)     Time: 1207-1220 PT Time Calculation (min) (ACUTE ONLY): 13 min  Charges:   PT Evaluation $PT Eval Moderate Complexity: 1 Mod      Lakeisha Waldrop B, PT, DPT Acute Rehab Services [phone]   Vyom Brass  02/04/2024, 3:27 PM

## 2024-02-04 NOTE — Progress Notes (Signed)
 OT Cancellation Note  Patient Details Name: [name] MRN: [id number] DOB: Sep 13, 1950   Cancelled Treatment:    Reason Eval/Treat Not Completed: OT screened, no needs identified, will sign off  Leita Howell, OTR/L,CBIS  Supplemental OT - [facility] and [facility] Secure Chat Preferred   02/04/2024, 3:49 PM

## 2024-02-04 NOTE — Plan of Care (Signed)
  Problem: Education: Goal: Ability to describe self-care measures that may prevent or decrease complications (Diabetes Survival Skills Education) will improve Outcome: Progressing Goal: Individualized Educational Video(s) Outcome: Progressing   Problem: Coping: Goal: Ability to adjust to condition or change in health will improve Outcome: Progressing   Problem: Fluid Volume: Goal: Ability to maintain a balanced intake and output will improve Outcome: Progressing   Problem: Health Behavior/Discharge Planning: Goal: Ability to identify and utilize available resources and services will improve Outcome: Progressing Goal: Ability to manage health-related needs will improve Outcome: Progressing   Problem: Skin Integrity: Goal: Risk for impaired skin integrity will decrease Outcome: Progressing   

## 2024-02-04 NOTE — Progress Notes (Signed)
 PROGRESS NOTE        PATIENT DETAILS Name: [name] Age: 73 y.o. Sex: male Date of Birth: 1951/04/03 Admit Date: 02/02/2024 Admitting Physician No admitting provider for patient encounter. ERE:Dxjxoz, Massie, DO  Brief Summary: Patient is a 73 y.o.  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  Significant events: 6/21>> admit to [facility]  Significant studies: 6/21>> CXR: Strandy/patchy opacity-right lung base.  Significant microbiology data: 6/21>> COVID/influenza/RSV PCR: Negative 6/21>> blood culture 1/2-present-Staph epidermidis-likely contamination. 6/22>> urine streptococcal antigen: Negative 6/22>> urine Legionella antigen: pending  Procedures: None  Consults: None  Subjective: Feels much better-no fever overnight-no chills-no cough at all.  No nausea vomiting.  Objective: Vitals: Blood pressure (!) 143/59, pulse 92, temperature 98.4 F (36.9 C), temperature source Oral, resp. rate 16, height 6' 1 (1.854 m), weight 86.2 kg, SpO2 98%.   Exam: Gen Exam:Alert awake-not in any distress HEENT:atraumatic, normocephalic Chest: B/L clear to auscultation anteriorly CVS:S1S2 regular Abdomen:soft non tender, non distended Extremities:no edema Neurology: Non focal Skin: no rash  Pertinent Labs/Radiology:    Latest Ref Rng & Units 02/03/2024    2:55 AM 02/02/2024    7:28 PM 02/02/2024    7:23 PM  CBC  WBC 4.0 - 10.5 K/uL 17.8   11.6   Hemoglobin 13.0 - 17.0 g/dL 86.3  86.3    86.3  85.4   Hematocrit 39.0 - 52.0 % 37.6  40.0    40.0  40.8   Platelets 150 - 400 K/uL 136   175     Lab Results  Component Value Date   NA 135 02/04/2024   K 3.4 (L) 02/04/2024   CL 105 02/04/2024   CO2 19 (L) 02/04/2024     Assessment/Plan: Severe sepsis secondary to RLL PNA Sepsis physiology  rapidly improved-afebrile-downtrending leukocytosis Cultures negative so far Surprisingly he does not have much of symptoms of pneumonia (procalcitonin significantly elevated)-he seems to be responding to current antimicrobial regimen-does not appear to have any other foci of infection on exam or by history-so plan is to continue this for another 24 hours-and transition to oral antibiotics tomorrow and discharge home if he continues to improve.  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 Gatha  CKD stage IIIa Close to baseline Follow electrolytes periodically.  DM-2 (A1c 7.3 on 12/29) Mild hypoglycemic events overnight Stop Amaryl  Change SSI to extra sensitive Cautiously continue with Jardiance  and Tradjenta  Follow closely.  History of CAD History of remote PCI Reviewed recent outpatient cardiology note-previously on DAPT but stopped due to traumatic SAH. Currently on aspirin /Lipitor  HTN BP stable-but creeping up. Continue to hold metoprolol /losartan  for now  HLD Statin  Hypothyroidism Synthroid  TSH 6/22 stable.  GERD Pepcid   History of AAA Stable for continued outpatient monitoring.  Code status:   Code Status: Full Code   DVT Prophylaxis: enoxaparin  (LOVENOX ) injection 40 mg Start: 02/04/24 0930 SCDs Start: 02/02/24 2250   Family Communication: None at bedside   Disposition Plan: Status is: Inpatient Remains inpatient appropriate because: Severity of illness   Planned Discharge Destination:Home   Diet: Diet Order             Diet heart healthy/carb modified Room service appropriate?  Yes; Fluid consistency: Thin  Diet effective now                     Antimicrobial agents: Anti-infectives (From admission, onward)    Start     Dose/Rate Route Frequency Ordered Stop   02/03/24 0800  cefTRIAXone  (ROCEPHIN ) 2 g in sodium  chloride 0.9 % 100 mL IVPB        2 g 200 mL/hr over 30 Minutes Intravenous Every 24 hours 02/02/24 2251 02/08/24 0759   02/03/24 0000  azithromycin  (ZITHROMAX ) 500 mg in sodium chloride  0.9 % 250 mL IVPB        500 mg 250 mL/hr over 60 Minutes Intravenous Every 24 hours 02/02/24 2251 02/07/24 2359   02/02/24 1945  vancomycin  (VANCOREADY) IVPB 1500 mg/300 mL        1,500 mg 150 mL/hr over 120 Minutes Intravenous  Once 02/02/24 1930 02/02/24 2301   02/02/24 1915  ceFEPIme  (MAXIPIME ) 2 g in sodium chloride  0.9 % 100 mL IVPB        2 g 200 mL/hr over 30 Minutes Intravenous  Once 02/02/24 1911 02/02/24 2016   02/02/24 1915  metroNIDAZOLE  (FLAGYL ) IVPB 500 mg        500 mg 100 mL/hr over 60 Minutes Intravenous  Once 02/02/24 1911 02/02/24 2045   02/02/24 1915  vancomycin  (VANCOCIN ) IVPB 1000 mg/200 mL premix  Status:  Discontinued        1,000 mg 200 mL/hr over 60 Minutes Intravenous  Once 02/02/24 1911 02/02/24 1930        MEDICATIONS: Scheduled Meds:  albuterol  2.5 mg Nebulization Q6H   atorvastatin   40 mg Oral Daily   azelastine  2 spray Each Nare Daily   And   fluticasone   2 spray Each Nare Daily   cholecalciferol  2,000 Units Oral Daily   cyanocobalamin  1,000 mcg Oral Daily   empagliflozin   25 mg Oral Daily   enoxaparin  (LOVENOX ) injection  40 mg Subcutaneous Q24H   erlotinib   100 mg Oral Daily   famotidine   40 mg Oral BID   insulin  aspart  0-6 Units Subcutaneous TID WC   levothyroxine   25 mcg Oral Q0600   linagliptin   5 mg Oral Daily   loteprednol   1 drop Both Eyes Daily   olopatadine   1 drop Both Eyes BID   potassium chloride   40 mEq Oral Once   Continuous Infusions:  azithromycin  500 mg (02/03/24 2325)   cefTRIAXone  (ROCEPHIN )  IV 2 g (02/04/24 0834)   magnesium  sulfate bolus IVPB     PRN Meds:.acetaminophen  **OR** acetaminophen , artificial tears, cyclobenzaprine, fentaNYL  (SUBLIMAZE ) injection, HYDROmorphone  (DILAUDID ) injection, ipratropium, loratadine,  metoprolol  tartrate, polyethylene glycol, zolpidem    I have personally reviewed following labs and imaging studies  LABORATORY DATA: CBC: Recent Labs  Lab 02/02/24 1923 02/02/24 1928 02/03/24 0255  WBC 11.6*  --  17.8*  NEUTROABS 11.0*  --   --   HGB 14.5 13.6  13.6 13.6  HCT 40.8 40.0  40.0 37.6*  MCV 93.4  --  94.7  PLT 175  --  136*    Basic Metabolic Panel: Recent Labs  Lab 02/02/24 1923 02/02/24 1928 02/03/24 0255 02/04/24 0543  NA 137 139  138 135 135  K 3.5 3.6  3.5 3.9 3.4*  CL 104 104 108 105  CO2 21*  --  22 19*  GLUCOSE 179* 176* 117* 61*  BUN 21 22 18 17   CREATININE 1.79* 1.70* 1.79* 1.36*  CALCIUM  8.9  --  7.9* 8.3*  MG  --   --   --  1.5*    GFR: Estimated Creatinine Clearance: 53.9 mL/min (A) (by C-G formula based on SCr of 1.36 mg/dL (H)).  Liver Function Tests: Recent Labs  Lab 02/02/24 1923 02/03/24 0255  AST 29 30  ALT 23 21  ALKPHOS 63 42  BILITOT 1.6* 1.7*  PROT 5.9* 5.2*  ALBUMIN 3.1* 2.8*   Recent Labs  Lab 02/02/24 1923  LIPASE 58*   No results for input(s): AMMONIA in the last 168 hours.  Coagulation Profile: Recent Labs  Lab 02/02/24 2021  INR 1.1    Cardiac Enzymes: No results for input(s): CKTOTAL, CKMB, CKMBINDEX, TROPONINI in the last 168 hours.  BNP (last 3 results) No results for input(s): PROBNP in the last 8760 hours.  Lipid Profile: No results for input(s): CHOL, HDL, LDLCALC, TRIG, CHOLHDL, LDLDIRECT in the last 72 hours.  Thyroid Function Tests: Recent Labs    02/03/24 0255  TSH 2.395    Anemia Panel: No results for input(s): VITAMINB12, FOLATE, FERRITIN, TIBC, IRON, RETICCTPCT in the last 72 hours.  Urine analysis:    Component Value Date/Time   COLORURINE YELLOW 02/02/2024 1925   APPEARANCEUR CLEAR 02/02/2024 1925   LABSPEC 1.022 02/02/2024 1925   PHURINE 5.0 02/02/2024 1925   GLUCOSEU >=500 (A) 02/02/2024 1925   HGBUR SMALL (A) 02/02/2024 1925    BILIRUBINUR NEGATIVE 02/02/2024 1925   KETONESUR NEGATIVE 02/02/2024 1925   PROTEINUR NEGATIVE 02/02/2024 1925   NITRITE NEGATIVE 02/02/2024 1925   LEUKOCYTESUR NEGATIVE 02/02/2024 1925    Sepsis Labs: Lactic Acid, Venous    Component Value Date/Time   LATICACIDVEN 2.4 (HH) 02/02/2024 2124    MICROBIOLOGY: Recent Results (from the past 240 hours)  Resp panel by RT-PCR (RSV, Flu A&B, Covid) Anterior Nasal Swab     Status: None   Collection Time: 02/02/24  7:23 PM   Specimen: Anterior Nasal Swab  Result Value Ref Range Status   SARS Coronavirus 2 by RT PCR NEGATIVE NEGATIVE Final   Influenza A by PCR NEGATIVE NEGATIVE Final   Influenza B by PCR NEGATIVE NEGATIVE Final    Comment: (NOTE) The Xpert Xpress SARS-CoV-2/FLU/RSV plus assay is intended as an aid in the diagnosis of influenza from Nasopharyngeal swab specimens and should not be used as a sole basis for treatment. Nasal washings and aspirates are unacceptable for Xpert Xpress SARS-CoV-2/FLU/RSV testing.  Fact Sheet for Patients: BloggerCourse.com  Fact Sheet for Healthcare Providers: SeriousBroker.it  This test is not yet approved or cleared by the [place]  FDA and has been authorized for detection and/or diagnosis of SARS-CoV-2 by FDA under an Emergency Use Authorization (EUA). This EUA will remain in effect (meaning this test can be used) for the duration of the COVID-19 declaration under Section 564(b)(1) of the Act, 21 U.S.C. section 360bbb-3(b)(1), unless the authorization is terminated or revoked.     Resp Syncytial Virus by PCR NEGATIVE NEGATIVE Final    Comment: (NOTE) Fact Sheet for Patients: BloggerCourse.com  Fact Sheet for Healthcare Providers: SeriousBroker.it  This test is not yet approved or cleared by the [place]  FDA and has been authorized for detection and/or diagnosis of  SARS-CoV-2 by FDA under an Emergency Use Authorization (EUA). This EUA will remain in effect (meaning this test can be used) for the duration of the COVID-19 declaration under Section 564(b)(1) of the Act, 21 U.S.C. section 360bbb-3(b)(1), unless the authorization is terminated or revoked.  Performed  at [facility], [address], [place], [place] [address]   Blood Culture (routine x 2)     Status: None (Preliminary result)   Collection Time: 02/02/24  7:23 PM   Specimen: BLOOD  Result Value Ref Range Status   Specimen Description BLOOD SITE NOT SPECIFIED  Final   Special Requests   Final    BOTTLES DRAWN AEROBIC AND ANAEROBIC Blood Culture results may not be optimal due to an inadequate volume of blood received in culture bottles   Culture  Setup Time   Final    GRAM POSITIVE COCCI IN CLUSTERS AEROBIC BOTTLE ONLY CRITICAL RESULT CALLED TO, READ BACK BY AND VERIFIED WITH: MAYA RUTHA ORN [id number] FCP Performed at [facility], [address], [place], [place] [address]    Culture [facility] POSITIVE COCCI  Final   Report Status PENDING  Incomplete  Blood Culture ID Panel (Reflexed)     Status: Abnormal   Collection Time: 02/02/24  7:23 PM  Result Value Ref Range Status   Enterococcus faecalis NOT DETECTED NOT DETECTED Final   Enterococcus Faecium NOT DETECTED NOT DETECTED Final   Listeria monocytogenes NOT DETECTED NOT DETECTED Final   Staphylococcus species DETECTED (A) NOT DETECTED Final    Comment: CRITICAL RESULT CALLED TO, READ BACK BY AND VERIFIED WITH: PHARMD JIMMY W [id number] FCP    Staphylococcus aureus (BCID) NOT DETECTED NOT DETECTED Final   Staphylococcus epidermidis DETECTED (A) NOT DETECTED Final    Comment: CRITICAL RESULT CALLED TO, READ BACK BY AND VERIFIED WITH: PHARMD JIMMY W [id number] FCP    Staphylococcus lugdunensis NOT DETECTED NOT DETECTED Final   Streptococcus species NOT DETECTED NOT DETECTED Final   Streptococcus agalactiae NOT  DETECTED NOT DETECTED Final   Streptococcus pneumoniae NOT DETECTED NOT DETECTED Final   Streptococcus pyogenes NOT DETECTED NOT DETECTED Final   A.calcoaceticus-baumannii NOT DETECTED NOT DETECTED Final   Bacteroides fragilis NOT DETECTED NOT DETECTED Final   Enterobacterales NOT DETECTED NOT DETECTED Final   Enterobacter cloacae complex NOT DETECTED NOT DETECTED Final   Escherichia coli NOT DETECTED NOT DETECTED Final   Klebsiella aerogenes NOT DETECTED NOT DETECTED Final   Klebsiella oxytoca NOT DETECTED NOT DETECTED Final   Klebsiella pneumoniae NOT DETECTED NOT DETECTED Final   Proteus species NOT DETECTED NOT DETECTED Final   Salmonella species NOT DETECTED NOT DETECTED Final   Serratia marcescens NOT DETECTED NOT DETECTED Final   Haemophilus influenzae NOT DETECTED NOT DETECTED Final   Neisseria meningitidis NOT DETECTED NOT DETECTED Final   Pseudomonas aeruginosa NOT DETECTED NOT DETECTED Final   Stenotrophomonas maltophilia NOT DETECTED NOT DETECTED Final   Candida albicans NOT DETECTED NOT DETECTED Final   Candida auris NOT DETECTED NOT DETECTED Final   Candida glabrata NOT DETECTED NOT DETECTED Final   Candida krusei NOT DETECTED NOT DETECTED Final   Candida parapsilosis NOT DETECTED NOT DETECTED Final   Candida tropicalis NOT DETECTED NOT DETECTED Final   Cryptococcus neoformans/gattii NOT DETECTED NOT DETECTED Final   Methicillin resistance mecA/C NOT DETECTED NOT DETECTED Final    Comment: Performed at [facility], [address], [place], [place] [address]  Blood Culture (routine x 2)     Status: None (Preliminary result)   Collection Time: 02/02/24  7:34 PM   Specimen: BLOOD  Result Value Ref Range Status   Specimen Description BLOOD SITE NOT SPECIFIED  Final   Special Requests   Final    BOTTLES DRAWN  AEROBIC AND ANAEROBIC Blood Culture results may not be optimal due to an inadequate volume of blood received in culture bottles   Culture   Final    NO GROWTH  < 12 HOURS Performed at [facility], [address], [place], [place] [address]    Report Status PENDING  Incomplete  Respiratory (~20 pathogens) panel by PCR     Status: None   Collection Time: 02/03/24  8:19 AM   Specimen: Nasopharyngeal Swab; Respiratory  Result Value Ref Range Status   Adenovirus NOT DETECTED NOT DETECTED Final   Coronavirus 229E NOT DETECTED NOT DETECTED Final    Comment: (NOTE) The Coronavirus on the Respiratory Panel, DOES NOT test for the novel  Coronavirus (2019 nCoV)    Coronavirus HKU1 NOT DETECTED NOT DETECTED Final   Coronavirus NL63 NOT DETECTED NOT DETECTED Final   Coronavirus OC43 NOT DETECTED NOT DETECTED Final   Metapneumovirus NOT DETECTED NOT DETECTED Final   Rhinovirus / Enterovirus NOT DETECTED NOT DETECTED Final   Influenza A NOT DETECTED NOT DETECTED Final   Influenza B NOT DETECTED NOT DETECTED Final   Parainfluenza Virus 1 NOT DETECTED NOT DETECTED Final   Parainfluenza Virus 2 NOT DETECTED NOT DETECTED Final   Parainfluenza Virus 3 NOT DETECTED NOT DETECTED Final   Parainfluenza Virus 4 NOT DETECTED NOT DETECTED Final   Respiratory Syncytial Virus NOT DETECTED NOT DETECTED Final   Bordetella pertussis NOT DETECTED NOT DETECTED Final   Bordetella Parapertussis NOT DETECTED NOT DETECTED Final   Chlamydophila pneumoniae NOT DETECTED NOT DETECTED Final   Mycoplasma pneumoniae NOT DETECTED NOT DETECTED Final    Comment: Performed at [facility], [address], [place], [place] [address]    RADIOLOGY STUDIES/RESULTS: DG Chest Port 1 View Result Date: 02/02/2024 CLINICAL DATA:  Questionable sepsis EXAM: PORTABLE CHEST 1 VIEW COMPARISON:  Chest x-ray 09/26/2020 FINDINGS: The cardiomediastinal silhouette is within normal limits. There is some strandy and patchy opacities in the right lung base. The lungs are otherwise clear. There is no pleural effusion or pneumothorax. The cardiomediastinal silhouette is within normal limits. No  acute fractures are seen. IMPRESSION: Strandy and patchy opacities in the right lung base, which may represent atelectasis or pneumonia. Electronically Signed   By: Greig Pique M.D.   On: 02/02/2024 20:19     LOS: 2 days   Donalda Applebaum, MD  [facility]    To contact the attending provider between 7A-7P or the covering provider during after hours 7P-7A, please log into the web site [email or web address] and access using universal [place] password for that web site. If you do not have the password, please call the hospital operator.  02/04/2024, 8:36 AM

## 2024-02-05 ENCOUNTER — Other Ambulatory Visit (HOSPITAL_COMMUNITY): Payer: Self-pay

## 2024-02-05 DIAGNOSIS — I10 Essential (primary) hypertension: Secondary | ICD-10-CM

## 2024-02-05 DIAGNOSIS — E1169 Type 2 diabetes mellitus with other specified complication: Secondary | ICD-10-CM | POA: Diagnosis not present

## 2024-02-05 DIAGNOSIS — C3491 Malignant neoplasm of unspecified part of right bronchus or lung: Secondary | ICD-10-CM | POA: Diagnosis not present

## 2024-02-05 DIAGNOSIS — A419 Sepsis, unspecified organism: Secondary | ICD-10-CM | POA: Diagnosis not present

## 2024-02-05 LAB — BASIC METABOLIC PANEL WITH GFR
Anion gap: 11 (ref 5–15)
BUN: 19 mg/dL (ref 8–23)
CO2: 22 mmol/L (ref 22–32)
Calcium: 8.5 mg/dL — ABNORMAL LOW (ref 8.9–10.3)
Chloride: 105 mmol/L (ref 98–111)
Creatinine, Ser: 1.55 mg/dL — ABNORMAL HIGH (ref 0.61–1.24)
GFR, Estimated: 47 mL/min — ABNORMAL LOW (ref >60)
Glucose, Bld: 62 mg/dL — ABNORMAL LOW (ref 70–99)
Potassium: 4.2 mmol/L (ref 3.5–5.1)
Sodium: 138 mmol/L (ref 135–145)

## 2024-02-05 LAB — CBC WITH DIFFERENTIAL/PLATELET
Abs Immature Granulocytes: 0.04 10*3/uL (ref 0.00–0.07)
Basophils Absolute: 0 10*3/uL (ref 0.0–0.1)
Basophils Relative: 1 %
Eosinophils Absolute: 0.3 10*3/uL (ref 0.0–0.5)
Eosinophils Relative: 4 %
HCT: 39.7 % (ref 39.0–52.0)
Hemoglobin: 14.2 g/dL (ref 13.0–17.0)
Immature Granulocytes: 1 %
Lymphocytes Relative: 13 %
Lymphs Abs: 1.1 10*3/uL (ref 0.7–4.0)
MCH: 33.6 pg (ref 26.0–34.0)
MCHC: 35.8 g/dL (ref 30.0–36.0)
MCV: 94.1 fL (ref 80.0–100.0)
Monocytes Absolute: 0.7 10*3/uL (ref 0.1–1.0)
Monocytes Relative: 9 %
Neutro Abs: 5.9 10*3/uL (ref 1.7–7.7)
Neutrophils Relative %: 72 %
Platelets: 127 10*3/uL — ABNORMAL LOW (ref 150–400)
RBC: 4.22 MIL/uL (ref 4.22–5.81)
RDW: 14.5 % (ref 11.5–15.5)
WBC: 8.1 10*3/uL (ref 4.0–10.5)
nRBC: 0 % (ref 0.0–0.2)

## 2024-02-05 LAB — CULTURE, BLOOD (ROUTINE X 2)

## 2024-02-05 LAB — GLUCOSE, CAPILLARY
Glucose-Capillary: 65 mg/dL — ABNORMAL LOW (ref 70–99)
Glucose-Capillary: 72 mg/dL (ref 70–99)
Glucose-Capillary: 158 mg/dL — ABNORMAL HIGH (ref 70–99)

## 2024-02-05 LAB — MAGNESIUM: Magnesium: 2.2 mg/dL (ref 1.7–2.4)

## 2024-02-05 LAB — PROCALCITONIN: Procalcitonin: 17.13 ng/mL

## 2024-02-05 MED ORDER — AMOXICILLIN-POT CLAVULANATE 875-125 MG PO TABS
1.0000 | ORAL_TABLET | Freq: Two times a day (BID) | ORAL | 0 refills | Status: AC
  Filled 2024-02-05: qty 10, 5d supply, fill #0

## 2024-02-05 MED ORDER — MELATONIN 3 MG PO TABS
6.0000 mg | ORAL_TABLET | Freq: Every day | ORAL | Status: DC
  Administered 2024-02-05: 6 mg via ORAL
  Filled 2024-02-05: qty 2

## 2024-02-05 MED ORDER — ERLOTINIB HCL 100 MG PO TABS: 100.0000 mg | ORAL_TABLET | Freq: Every day | ORAL | Status: DC

## 2024-02-05 NOTE — Plan of Care (Signed)
  Problem: Education: Goal: Ability to describe self-care measures that may prevent or decrease complications (Diabetes Survival Skills Education) will improve Outcome: Progressing   Problem: Coping: Goal: Ability to adjust to condition or change in health will improve Outcome: Progressing   Problem: Fluid Volume: Goal: Ability to maintain a balanced intake and output will improve Outcome: Progressing   Problem: Health Behavior/Discharge Planning: Goal: Ability to identify and utilize available resources and services will improve Outcome: Progressing Goal: Ability to manage health-related needs will improve Outcome: Progressing   Problem: Metabolic: Goal: Ability to maintain appropriate glucose levels will improve Outcome: Progressing   Problem: Nutritional: Goal: Maintenance of adequate nutrition will improve Outcome: Progressing Goal: Progress toward achieving an optimal weight will improve Outcome: Progressing   Problem: Skin Integrity: Goal: Risk for impaired skin integrity will decrease Outcome: Progressing   Problem: Tissue Perfusion: Goal: Adequacy of tissue perfusion will improve Outcome: Progressing   Problem: Education: Goal: Knowledge of General Education information will improve Description: Including pain rating scale, medication(s)/side effects and non-pharmacologic comfort measures Outcome: Progressing   Problem: Health Behavior/Discharge Planning: Goal: Ability to manage health-related needs will improve Outcome: Progressing   Problem: Clinical Measurements: Goal: Ability to maintain clinical measurements within normal limits will improve Outcome: Progressing Goal: Will remain free from infection Outcome: Progressing Goal: Diagnostic test results will improve Outcome: Progressing Goal: Respiratory complications will improve Outcome: Progressing Goal: Cardiovascular complication will be avoided Outcome: Progressing   Problem: Activity: Goal:  Risk for activity intolerance will decrease Outcome: Progressing   Problem: Nutrition: Goal: Adequate nutrition will be maintained Outcome: Progressing   Problem: Coping: Goal: Level of anxiety will decrease Outcome: Progressing   Problem: Elimination: Goal: Will not experience complications related to bowel motility Outcome: Progressing Goal: Will not experience complications related to urinary retention Outcome: Progressing   Problem: Pain Managment: Goal: General experience of comfort will improve and/or be controlled Outcome: Progressing   Problem: Safety: Goal: Ability to remain free from injury will improve Outcome: Progressing   Problem: Skin Integrity: Goal: Risk for impaired skin integrity will decrease Outcome: Progressing   Problem: Activity: Goal: Ability to tolerate increased activity will improve Outcome: Progressing   Problem: Clinical Measurements: Goal: Ability to maintain a body temperature in the normal range will improve Outcome: Progressing   Problem: Respiratory: Goal: Ability to maintain adequate ventilation will improve Outcome: Progressing Goal: Ability to maintain a clear airway will improve Outcome: Progressing

## 2024-02-05 NOTE — Discharge Summary (Signed)
 PATIENT DETAILS Name: [name] Age: 73 y.o. Sex: male Date of Birth: Sep 16, 1950 MRN: [id number]. Admitting Physician: No admitting provider for patient encounter. ERE:Dxjxoz, Massie, DO  Admit Date: 02/02/2024 Discharge date: 02/05/2024  Recommendations for Outpatient Follow-up:  Follow up with PCP in 1-2 weeks Please obtain CMP/CBC in one week Please obtain 2 view chest x-ray in 4 to 6 weeks to ensure resolution of pneumonia.  Admitted From:  Home  Disposition: Home   Discharge Condition: good  CODE STATUS:   Code Status: Full Code   Diet recommendation:  Diet Order             Diet - low sodium heart healthy           Diet Carb Modified           Diet heart healthy/carb modified Room service appropriate? Yes; Fluid consistency: Thin  Diet effective now                    Brief Summary: Patient is a 73 y.o.  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   Significant events: 6/21>> admit to [facility]   Significant studies: 6/21>> CXR: Strandy/patchy opacity-right lung base.   Significant microbiology data: 6/21>> COVID/influenza/RSV PCR: Negative 6/21>> blood culture 1/2-present-Staph epidermidis-likely contamination. 6/22>> urine streptococcal antigen: Negative 6/22>> urine Legionella antigen: Negative   Procedures: None   Consults: None  Brief Hospital Course: Severe sepsis secondary to RLL PNA Sepsis physiology has rapidly improved-afebrile for almost 3 days-leukocytosis has resolved. Blood cultures negative-Staph epidermidis in 1/2 sets likely contamination. Surprisingly he does not have much symptoms of pneumonia-he feels much better-and is requesting discharge home early today.  He was on Rocephin /Zithromax -will be transition to Augmentin for a few more days. PCP to repeat two-view  chest x-ray in 4 to 6 weeks to document resolution of pneumonia.   Hypokalemia/hypomagnesemia Repleted.   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 Gatha.  Patient to resume maintenance Tarceva  once he completes course of antibiotics.   CKD stage IIIa Close to baseline Follow electrolytes periodically.   DM-2 (A1c 7.3 on 12/29) He did have a hypoglycemic event couple of nights ago-but since then has been stable. Continue to hold Amaryl  for now-PCP to reassess if this still needs to be continued Continue Jardiance  and Tradjenta .  Follow-up with PCP for further optimization.   History of CAD History of remote PCI Reviewed recent outpatient cardiology note-previously on DAPT but stopped due to traumatic SAH. Currently on aspirin /Lipitor   HTN BP stable-but creeping up. Resume metoprolol  Continue to hold losartan  until follow-up with PCP.   HLD Statin   Hypothyroidism Synthroid  TSH 6/22 stable.   GERD Pepcid    History of AAA Stable for continued outpatient monitoring.  Discharge Diagnoses:  Principal Problem:   Severe sepsis (HCC) Active Problems:   Lactic acidosis   Adenocarcinoma of lung, right (HCC)   Type 2 diabetes mellitus with other specified complication (HCC)   Hypertension   Chronic kidney disease, stage 3b (HCC)   Coronary artery disease involving native coronary artery of native heart without angina pectoris   Gastro-esophageal reflux disease without esophagitis   Hyperlipidemia   Hypothyroidism   Abdominal aortic aneurysm without rupture [facility])   Discharge Instructions:  Activity:  As tolerated Discharge Instructions     Call MD for:  difficulty breathing, headache or visual disturbances   Complete by: As directed    Call MD for:  persistant nausea and vomiting   Complete by: As directed    Call MD for:  temperature >100.4   Complete by: As directed    Diet - low sodium  heart healthy   Complete by: As directed    Diet Carb Modified   Complete by: As directed    Discharge instructions   Complete by: As directed    Follow with Primary MD  [name], DO in 1-2 weeks  Please get a complete blood count and chemistry panel checked by your Primary MD at your next visit, and again as instructed by your Primary MD.  Get Medicines reviewed and adjusted: Please take all your medications with you for your next visit with your Primary MD  Laboratory/radiological data: Please request your Primary MD to go over all hospital tests and procedure/radiological results at the follow up, please ask your Primary MD to get all Hospital records sent to his/her office.  In some cases, they will be blood work, cultures and biopsy results pending at the time of your discharge. Please request that your primary care M.D. follows up on these results.  Also Note the following: If you experience worsening of your admission symptoms, develop shortness of breath, life threatening emergency, suicidal or homicidal thoughts you must seek medical attention immediately by calling 911 or calling your MD immediately  if symptoms less severe.  You must read complete instructions/literature along with all the possible adverse reactions/side effects for all the Medicines you take and that have been prescribed to you. Take any new Medicines after you have completely understood and accpet all the possible adverse reactions/side effects.   Do not drive when taking Pain medications or sleeping medications (Benzodaizepines)  Do not take more than prescribed Pain, Sleep and Anxiety Medications. It is not advisable to combine anxiety,sleep and pain medications without talking with your primary care practitioner  Special Instructions: If you have smoked or chewed Tobacco  in the last 2 yrs please stop smoking, stop any regular Alcohol  and or any Recreational drug use.  Wear Seat belts while  driving.  Please note: You were cared for by a hospitalist during your hospital stay. Once you are discharged, your primary care physician will handle any further medical issues. Please note that NO REFILLS for any discharge medications will be authorized once you are discharged, as it is imperative that you return to your primary care physician (or establish a relationship with a primary care physician if you do not have one) for your post hospital discharge needs so that they can reassess your need for medications and monitor your lab values.   Increase activity slowly   Complete by: As directed       Allergies as of 02/05/2024       Reactions   Iohexol  Other (See Comments)    Code: HIVES, Desc: pt recieves 50mg  benadryl  1 hour prior to scan   Ivp Dye [iodinated Contrast Media] Rash   Morphine And Codeine Anxiety   hallucinations   Other Rash   chlorascrub when starting IV   Povidone Iodine Rash   Other reaction(s): heated rash        Medication List     PAUSE taking these medications    erlotinib  100 MG tablet Wait to take this until: February 09, 2024 Commonly known as: TARCEVA  Take 1 tablet by mouth once daily on  an empty stomach, 1 hour before meals or 2 hours after.   losartan  25 MG tablet Wait to take this until your doctor or other care provider tells you to start again. Commonly known as: COZAAR  TAKE 1 TABLET BY MOUTH EVERY DAY FOR 90 DAYS       STOP taking these medications    doxycycline  100 MG capsule Commonly known as: MONODOX    glimepiride  2 MG tablet Commonly known as: AMARYL        TAKE these medications    Accu-Chek Aviva Plus test strip Generic drug: glucose blood 1 each by Other route daily.   amoxicillin -clavulanate 875-125 MG tablet Commonly known as: AUGMENTIN Take 1 tablet by mouth 2 (two) times daily for 5 days.   atorvastatin  40 MG tablet Commonly known as: LIPITOR Take 40 mg by mouth daily.   cyanocobalamin 1000 MCG tablet Take  1,000 mcg by mouth daily.   famotidine  40 MG tablet Commonly known as: PEPCID  TAKE 1 TABLET BY MOUTH 2 TIMES DAILY. PLEASE CALL [phone] SCHEDULE AN OFFICE VISIT FOR REFILLS.   freestyle lancets 1 each by Other route daily.   ipratropium 0.06 % nasal spray Commonly known as: ATROVENT  PLACE 2 SPRAYS INTO BOTH NOSTRILS EVERY 6 (SIX) HOURS AS NEEDED FOR RHINITIS.   Jardiance  25 MG Tabs tablet Generic drug: empagliflozin  Take 25 mg by mouth daily.   levocetirizine 5 MG tablet Commonly known as: XYZAL  Take 1 tablet (5 mg total) by mouth daily as needed (Can take an extra dose during flare ups.).   levothyroxine  25 MCG tablet Commonly known as: SYNTHROID  Take 25 mcg by mouth every morning.   loteprednol  0.5 % ophthalmic suspension Commonly known as: Lotemax  Place 1 drop into both eyes daily.   Magnesium  Glycinate 100 MG Caps Take 2 capsules Orally nightly   metoprolol  tartrate 50 MG tablet Commonly known as: LOPRESSOR  TAKE 1 TABLET BY MOUTH TWICE A DAY   nitroGLYCERIN  0.4 MG SL tablet Commonly known as: Nitrostat  Place 1 tablet (0.4 mg total) under the tongue every 5 (five) minutes as needed for chest pain (up to 3 doses).   nystatin-triamcinolone ointment Commonly known as: MYCOLOG Apply 1 Application topically as needed.   Olopatadine  HCl 0.2 % Soln Commonly known as: Pataday  Place 1 drop into both eyes daily.   Prolia  60 MG/ML Sosy injection Generic drug: denosumab  Inject 60 mg into the skin every 6 (six) months.   Ryaltris  665-25 MCG/ACT Susp Generic drug: Olopatadine -Mometasone  Place 2 sprays into the nose in the morning and at bedtime.   Systane Complete PF 0.6 % Soln Generic drug: Propylene Glycol (PF) Apply 1 drop to eye every 4 (four) hours as needed.   Tradjenta  5 MG Tabs tablet Generic drug: linagliptin  Take 5 mg by mouth daily.   Vitamin D3 75 MCG (3000 UT) Tabs Take 50 mcg by mouth daily. Takes 50 mcg/day   zaleplon  10 MG capsule Commonly  known as: SONATA  Take 1 capsule (10 mg total) by mouth at bedtime.        Allergies  Allergen Reactions   Iohexol  Other (See Comments)     Code: HIVES, Desc: pt recieves 50mg  benadryl  1 hour prior to scan    Ivp Dye [Iodinated Contrast Media] Rash   Morphine And Codeine Anxiety    hallucinations   Other Rash    chlorascrub when starting IV   Povidone Iodine Rash    Other reaction(s): heated rash     Other Procedures/Studies: DG Chest Port 1 [address]  Result Date: 02/02/2024 CLINICAL DATA:  Questionable sepsis EXAM: PORTABLE CHEST 1 VIEW COMPARISON:  Chest x-ray 09/26/2020 FINDINGS: The cardiomediastinal silhouette is within normal limits. There is some strandy and patchy opacities in the right lung base. The lungs are otherwise clear. There is no pleural effusion or pneumothorax. The cardiomediastinal silhouette is within normal limits. No acute fractures are seen. IMPRESSION: Strandy and patchy opacities in the right lung base, which may represent atelectasis or pneumonia. Electronically Signed   By: Greig Pique M.D.   On: 02/02/2024 20:19     TODAY-DAY OF DISCHARGE:  Subjective:   [name] today has no headache,no chest abdominal pain,no new weakness tingling or numbness, feels much better wants to go home today.   Objective:   Blood pressure 122/66, pulse 82, temperature 97.7 F (36.5 C), temperature source Oral, resp. rate 16, height 6' 1 (1.854 m), weight 86.2 kg, SpO2 95%. No intake or output data in the 24 hours ending 02/05/24 0804 Filed Weights   02/02/24 1910  Weight: 86.2 kg    Exam: Awake Alert, Oriented *3, No new F.N deficits, Normal affect [place].AT,PERRAL Supple Neck,No JVD, No cervical lymphadenopathy appriciated.  Symmetrical Chest wall movement, Good air movement bilaterally, CTAB RRR,No Gallops,Rubs or new Murmurs, No Parasternal Heave +ve B.Sounds, Abd Soft, Non tender, No organomegaly appriciated, No rebound -guarding or rigidity. No Cyanosis,  Clubbing or edema, No new Rash or bruise   PERTINENT RADIOLOGIC STUDIES: No results found.   PERTINENT LAB RESULTS: CBC: Recent Labs    02/03/24 0255 02/05/24 0553  WBC 17.8* 8.1  HGB 13.6 14.2  HCT 37.6* 39.7  PLT 136* 127*   CMET CMP     Component Value Date/Time   NA 138 02/05/2024 0553   NA 139 06/04/2017 0948   K 4.2 02/05/2024 0553   K 5.1 06/04/2017 0948   CL 105 02/05/2024 0553   CL 106 06/10/2012 0900   CO2 22 02/05/2024 0553   CO2 26 06/04/2017 0948   GLUCOSE 62 (L) 02/05/2024 0553   GLUCOSE 152 (H) 06/04/2017 0948   GLUCOSE 194 (H) 06/10/2012 0900   BUN 19 02/05/2024 0553   BUN 14.2 06/04/2017 0948   CREATININE 1.55 (H) 02/05/2024 0553   CREATININE 1.63 (H) 12/19/2023 1309   CREATININE 1.5 (H) 06/04/2017 0948   CALCIUM  8.5 (L) 02/05/2024 0553   CALCIUM  9.6 06/04/2017 0948   PROT 5.2 (L) 02/03/2024 0255   PROT 7.0 06/04/2017 0948   ALBUMIN 2.8 (L) 02/03/2024 0255   ALBUMIN 3.7 06/04/2017 0948   AST 30 02/03/2024 0255   AST 17 12/19/2023 1309   AST 24 06/04/2017 0948   ALT 21 02/03/2024 0255   ALT 19 12/19/2023 1309   ALT 35 06/04/2017 0948   ALKPHOS 42 02/03/2024 0255   ALKPHOS 84 06/04/2017 0948   BILITOT 1.7 (H) 02/03/2024 0255   BILITOT 1.2 12/19/2023 1309   BILITOT 0.82 06/04/2017 0948   GFR 53.78 (L) 09/03/2013 1030   EGFR 46 (L) 06/04/2017 0948   GFRNONAA 47 (L) 02/05/2024 0553   GFRNONAA 44 (L) 12/19/2023 1309    GFR Estimated Creatinine Clearance: 47.3 mL/min (A) (by C-G formula based on SCr of 1.55 mg/dL (H)). Recent Labs    02/02/24 1923  LIPASE 58*   No results for input(s): CKTOTAL, CKMB, CKMBINDEX, TROPONINI in the last 72 hours. Invalid input(s): POCBNP No results for input(s): DDIMER in the last 72 hours. No results for input(s): HGBA1C in the last 72 hours. No results for  input(s): CHOL, HDL, LDLCALC, TRIG, CHOLHDL, LDLDIRECT in the last 72 hours. Recent Labs    02/03/24 0255  TSH 2.395    No results for input(s): VITAMINB12, FOLATE, FERRITIN, TIBC, IRON, RETICCTPCT in the last 72 hours. Coags: Recent Labs    02/02/24 2021  INR 1.1   Microbiology: Recent Results (from the past 240 hours)  Resp panel by RT-PCR (RSV, Flu A&B, Covid) Anterior Nasal Swab     Status: None   Collection Time: 02/02/24  7:23 PM   Specimen: Anterior Nasal Swab  Result Value Ref Range Status   SARS Coronavirus 2 by RT PCR NEGATIVE NEGATIVE Final   Influenza A by PCR NEGATIVE NEGATIVE Final   Influenza B by PCR NEGATIVE NEGATIVE Final    Comment: (NOTE) The Xpert Xpress SARS-CoV-2/FLU/RSV plus assay is intended as an aid in the diagnosis of influenza from Nasopharyngeal swab specimens and should not be used as a sole basis for treatment. Nasal washings and aspirates are unacceptable for Xpert Xpress SARS-CoV-2/FLU/RSV testing.  Fact Sheet for Patients: BloggerCourse.com  Fact Sheet for Healthcare Providers: SeriousBroker.it  This test is not yet approved or cleared by the [place]  FDA and has been authorized for detection and/or diagnosis of SARS-CoV-2 by FDA under an Emergency Use Authorization (EUA). This EUA will remain in effect (meaning this test can be used) for the duration of the COVID-19 declaration under Section 564(b)(1) of the Act, 21 U.S.C. section 360bbb-3(b)(1), unless the authorization is terminated or revoked.     Resp Syncytial Virus by PCR NEGATIVE NEGATIVE Final    Comment: (NOTE) Fact Sheet for Patients: BloggerCourse.com  Fact Sheet for Healthcare Providers: SeriousBroker.it  This test is not yet approved or cleared by the [place]  FDA and has been authorized for detection and/or diagnosis of SARS-CoV-2 by FDA under an Emergency Use Authorization (EUA). This EUA will remain in effect (meaning this test can be used) for the duration of  the COVID-19 declaration under Section 564(b)(1) of the Act, 21 U.S.C. section 360bbb-3(b)(1), unless the authorization is terminated or revoked.  Performed at [facility], [address], [place], [place] [address]   Blood Culture (routine x 2)     Status: Abnormal (Preliminary result)   Collection Time: 02/02/24  7:23 PM   Specimen: BLOOD  Result Value Ref Range Status   Specimen Description BLOOD SITE NOT SPECIFIED  Final   Special Requests   Final    BOTTLES DRAWN AEROBIC AND ANAEROBIC Blood Culture results may not be optimal due to an inadequate volume of blood received in culture bottles   Culture  Setup Time   Final    GRAM POSITIVE COCCI IN CLUSTERS AEROBIC BOTTLE ONLY CRITICAL RESULT CALLED TO, READ BACK BY AND VERIFIED WITH: PHARMD JIMMY W [id number] FCP    Culture (A)  Final    STAPHYLOCOCCUS EPIDERMIDIS THE SIGNIFICANCE OF ISOLATING THIS ORGANISM FROM A SINGLE SET OF BLOOD CULTURES WHEN MULTIPLE SETS ARE DRAWN IS UNCERTAIN. PLEASE NOTIFY THE MICROBIOLOGY DEPARTMENT WITHIN ONE WEEK IF SPECIATION AND SENSITIVITIES ARE REQUIRED. Performed at [facility], [address], [place], [place] [address]    Report Status PENDING  Incomplete  Blood Culture ID Panel (Reflexed)     Status: Abnormal   Collection Time: 02/02/24  7:23 PM  Result Value Ref Range Status   Enterococcus faecalis NOT DETECTED NOT DETECTED Final   Enterococcus Faecium NOT DETECTED NOT DETECTED Final   Listeria monocytogenes NOT DETECTED NOT DETECTED Final  Staphylococcus species DETECTED (A) NOT DETECTED Final    Comment: CRITICAL RESULT CALLED TO, READ BACK BY AND VERIFIED WITH: PHARMD JIMMY W [id number] FCP    Staphylococcus aureus (BCID) NOT DETECTED NOT DETECTED Final   Staphylococcus epidermidis DETECTED (A) NOT DETECTED Final    Comment: CRITICAL RESULT CALLED TO, READ BACK BY AND VERIFIED WITH: PHARMD JIMMY W [id number] FCP    Staphylococcus lugdunensis NOT DETECTED NOT DETECTED  Final   Streptococcus species NOT DETECTED NOT DETECTED Final   Streptococcus agalactiae NOT DETECTED NOT DETECTED Final   Streptococcus pneumoniae NOT DETECTED NOT DETECTED Final   Streptococcus pyogenes NOT DETECTED NOT DETECTED Final   A.calcoaceticus-baumannii NOT DETECTED NOT DETECTED Final   Bacteroides fragilis NOT DETECTED NOT DETECTED Final   Enterobacterales NOT DETECTED NOT DETECTED Final   Enterobacter cloacae complex NOT DETECTED NOT DETECTED Final   Escherichia coli NOT DETECTED NOT DETECTED Final   Klebsiella aerogenes NOT DETECTED NOT DETECTED Final   Klebsiella oxytoca NOT DETECTED NOT DETECTED Final   Klebsiella pneumoniae NOT DETECTED NOT DETECTED Final   Proteus species NOT DETECTED NOT DETECTED Final   Salmonella species NOT DETECTED NOT DETECTED Final   Serratia marcescens NOT DETECTED NOT DETECTED Final   Haemophilus influenzae NOT DETECTED NOT DETECTED Final   Neisseria meningitidis NOT DETECTED NOT DETECTED Final   Pseudomonas aeruginosa NOT DETECTED NOT DETECTED Final   Stenotrophomonas maltophilia NOT DETECTED NOT DETECTED Final   Candida albicans NOT DETECTED NOT DETECTED Final   Candida auris NOT DETECTED NOT DETECTED Final   Candida glabrata NOT DETECTED NOT DETECTED Final   Candida krusei NOT DETECTED NOT DETECTED Final   Candida parapsilosis NOT DETECTED NOT DETECTED Final   Candida tropicalis NOT DETECTED NOT DETECTED Final   Cryptococcus neoformans/gattii NOT DETECTED NOT DETECTED Final   Methicillin resistance mecA/C NOT DETECTED NOT DETECTED Final    Comment: Performed at [facility], [address], [place], [place] [address]  Blood Culture (routine x 2)     Status: None (Preliminary result)   Collection Time: 02/02/24  7:34 PM   Specimen: BLOOD  Result Value Ref Range Status   Specimen Description BLOOD SITE NOT SPECIFIED  Final   Special Requests   Final    BOTTLES DRAWN AEROBIC AND ANAEROBIC Blood Culture results may not be optimal due  to an inadequate volume of blood received in culture bottles   Culture   Final    NO GROWTH 2 DAYS Performed at [facility], [address] [place], [place] [address]    Report Status PENDING  Incomplete  Respiratory (~20 pathogens) panel by PCR     Status: None   Collection Time: 02/03/24  8:19 AM   Specimen: Nasopharyngeal Swab; Respiratory  Result Value Ref Range Status   Adenovirus NOT DETECTED NOT DETECTED Final   Coronavirus 229E NOT DETECTED NOT DETECTED Final    Comment: (NOTE) The Coronavirus on the Respiratory Panel, DOES NOT test for the novel  Coronavirus (2019 nCoV)    Coronavirus HKU1 NOT DETECTED NOT DETECTED Final   Coronavirus NL63 NOT DETECTED NOT DETECTED Final   Coronavirus OC43 NOT DETECTED NOT DETECTED Final   Metapneumovirus NOT DETECTED NOT DETECTED Final   Rhinovirus / Enterovirus NOT DETECTED NOT DETECTED Final   Influenza A NOT DETECTED NOT DETECTED Final   Influenza B NOT DETECTED NOT DETECTED Final   Parainfluenza Virus 1 NOT DETECTED NOT DETECTED Final   Parainfluenza Virus 2 NOT DETECTED  NOT DETECTED Final   Parainfluenza Virus 3 NOT DETECTED NOT DETECTED Final   Parainfluenza Virus 4 NOT DETECTED NOT DETECTED Final   Respiratory Syncytial Virus NOT DETECTED NOT DETECTED Final   Bordetella pertussis NOT DETECTED NOT DETECTED Final   Bordetella Parapertussis NOT DETECTED NOT DETECTED Final   Chlamydophila pneumoniae NOT DETECTED NOT DETECTED Final   Mycoplasma pneumoniae NOT DETECTED NOT DETECTED Final    Comment: Performed at [facility], [address] [place], [place] [address]    FURTHER DISCHARGE INSTRUCTIONS:  Get Medicines reviewed and adjusted: Please take all your medications with you for your next visit with your Primary MD  Laboratory/radiological data: Please request your Primary MD to go over all hospital tests and procedure/radiological results at the follow up, please ask your Primary MD to get all Hospital records  sent to his/her office.  In some cases, they will be blood work, cultures and biopsy results pending at the time of your discharge. Please request that your primary care M.D. goes through all the records of your hospital data and follows up on these results.  Also Note the following: If you experience worsening of your admission symptoms, develop shortness of breath, life threatening emergency, suicidal or homicidal thoughts you must seek medical attention immediately by calling 911 or calling your MD immediately  if symptoms less severe.  You must read complete instructions/literature along with all the possible adverse reactions/side effects for all the Medicines you take and that have been prescribed to you. Take any new Medicines after you have completely understood and accpet all the possible adverse reactions/side effects.   Do not drive when taking Pain medications or sleeping medications (Benzodaizepines)  Do not take more than prescribed Pain, Sleep and Anxiety Medications. It is not advisable to combine anxiety,sleep and pain medications without talking with your primary care practitioner  Special Instructions: If you have smoked or chewed Tobacco  in the last 2 yrs please stop smoking, stop any regular Alcohol  and or any Recreational drug use.  Wear Seat belts while driving.  Please note: You were cared for by a hospitalist during your hospital stay. Once you are discharged, your primary care physician will handle any further medical issues. Please note that NO REFILLS for any discharge medications will be authorized once you are discharged, as it is imperative that you return to your primary care physician (or establish a relationship with a primary care physician if you do not have one) for your post hospital discharge needs so that they can reassess your need for medications and monitor your lab values.  Total Time spent coordinating discharge including counseling, education and  face to face time equals greater than 30 minutes.  SignedBETHA Donalda Applebaum 02/05/2024 8:04 AM

## 2024-02-05 NOTE — Care Management Important Message (Signed)
 Important Message  Patient Details  Name: [name] MRN: [id number] Date of Birth: 1951/07/15   Important Message Given:  Yes - Medicare IM     Jon Cruel 02/05/2024, 12:04 PM

## 2024-02-05 NOTE — TOC Transition Note (Signed)
 Transition of Care [facility]) - Discharge Note   Patient Details  Name: [name] MRN: [id number] Date of Birth: 15-Nov-1950  Transition of Care [facility]) CM/SW Contact:  Hendricks KANDICE Her, RN Phone Number: 02/05/2024, 9:03 AM   Clinical Narrative:     Patient will DC to home today. No TOC needs identified. Patient will follow up as directed on AVS   Family to transport            Patient Goals and CMS Choice            Discharge Placement                       Discharge Plan and Services Additional resources added to the After Visit Summary for                                       Social Drivers of Health (SDOH) Interventions SDOH Screenings   Food Insecurity: No Food Insecurity (02/04/2024)  Housing: Low Risk  (02/04/2024)  Transportation Needs: No Transportation Needs (02/04/2024)  Utilities: Not At Risk (02/04/2024)  Social Connections: Socially Isolated (02/04/2024)  Tobacco Use: Medium Risk (02/02/2024)     Readmission Risk Interventions     No data to display

## 2024-02-05 NOTE — Progress Notes (Signed)
 DISCHARGE NOTE HOME Jacquelyn Antony Misch II to be discharged Home per MD order. Discussed prescriptions and follow up appointments with the patient. Prescriptions given to patient; medication list explained in detail. Patient verbalized understanding.  Skin clean, dry and intact without evidence of skin break down, no evidence of skin tears noted. IV catheter discontinued intact. Site without signs and symptoms of complications. Dressing and pressure applied. Pt denies pain at the site currently. No complaints noted.  Patient free of lines, drains, and wounds.   An After Visit Summary (AVS) was printed and given to the patient. Patient escorted via wheelchair, and discharged home via private auto.  [name], RN

## 2024-02-05 NOTE — Progress Notes (Signed)
 DISCHARGE NOTE HOME [name] to be discharged Home per MD order. Discussed prescriptions and follow up appointments with the patient. Prescriptions given to patient; medication list explained in detail. Patient verbalized understanding.  Skin clean, dry and intact without evidence of skin break down, no evidence of skin tears noted. IV catheter discontinued intact. Site without signs and symptoms of complications. Dressing and pressure applied. Pt denies pain at the site currently. No complaints noted.  Patient free of lines, drains, and wounds.   An After Visit Summary (AVS) was printed and given to the patient. Patient escorted via wheelchair, and discharged home via private auto.  [name], RN

## 2024-02-07 LAB — CULTURE, BLOOD (ROUTINE X 2): Culture: NO GROWTH

## 2024-02-14 ENCOUNTER — Ambulatory Visit (HOSPITAL_COMMUNITY)
Admission: RE | Admit: 2024-02-14 | Discharge: 2024-02-14 | Disposition: A | Discharge status: Home / Self Care | Source: Ambulatory Visit | Attending: Physician Assistant | Admitting: Physician Assistant

## 2024-02-14 DIAGNOSIS — C3491 Malignant neoplasm of unspecified part of right bronchus or lung: Secondary | ICD-10-CM | POA: Diagnosis present

## 2024-02-19 ENCOUNTER — Telehealth: Payer: Self-pay | Admitting: Physician Assistant

## 2024-02-19 NOTE — Telephone Encounter (Signed)
 Left the patient a voicemail with the rescheduled appointment details per provider out of office.

## 2024-02-20 ENCOUNTER — Other Ambulatory Visit

## 2024-02-20 ENCOUNTER — Ambulatory Visit: Admitting: Internal Medicine

## 2024-03-03 NOTE — Progress Notes (Unsigned)
 [facility] OFFICE PROGRESS NOTE  [name], DO [address] [place] [id number]  DIAGNOSIS: Metastatic non-small cell lung cancer, adenocarcinoma initially diagnosed in December of 2004   PRIOR THERAPY: 1) status post course of concurrent chemoradiation under the care of Dr. Melodye. 2) status post left upper lobe wedge resection on 01/10/2005 for recurrent disease in the left lung.  CURRENT THERAPY: Erlotinib  150 mg by mouth daily started in 2006.  Starting August 2022 the patient will be on erlotinib  100 mg p.o. daily.  Status post 35.5 months of treatment.   INTERVAL HISTORY: [name] 73 y.o. male returns to the clinic for a follow up visit.  The patient was last seen on 12/19/23.   In the interval since last being seen, he was hospitalized from 6/21-6/24 for sepsis secondary to RLL pneumonia. He had his restaging CT scan on 02/14/24.  Since being discharged, he is feeling better. The patient continues to tolerate treatment with oral treatment with Erlotinib  well without any adverse effects.   Since discharge, he has experienced no fevers, chills, or night sweats. He notes a slight weight loss of a couple of pounds but maintains a good appetite. He has increased shortness of breath and a dry cough that began two to three days ago. The cough originates from the chest without phlegm, blood, or chest discomfort.  He has a history of diabetes and monitors his blood sugar at home, checking it once in the morning. Recently, his blood sugar levels have increased, with a reading of 343 mg/dL today, higher than usual. He takes glimepiride , which was increased to twice daily.   During his hospitalization, his blood sugar fluctuated, leading to a temporary discontinuation of glimepiride , but he resumed it after noticing elevated levels. His blood sugar was 156 mg/dL this morning, closer to his typical range of around 100 mg/dL.  His potassium level was noted to be high  today, but he is unsure of the cause. He denies taking potassium supplements and does not consume potassium-rich foods regularly. He does not have a pulmonologist but sees a cardiologist. He denies taking prednisone or other steroids.  The patient is here today for evaluation and repeat blood work.    MEDICAL HISTORY: Past Medical History:  Diagnosis Date   AAA (abdominal aortic aneurysm) (HCC)    a. 3.4 x 3.5 on scan 02/28/13.   Allergic reaction to contrast dye    Atherosclerosis    CAD (coronary artery disease)    a. stenting of RCA/LCx 2004. b. [place]  08/2013:  s/p DES to LCx for severe ISR.   Diabetes mellitus    Diverticulosis    Emphysema lung (HCC)    Emphysema of lung (HCC)    Encounter for therapeutic drug monitoring 06/06/2016   GERD (gastroesophageal reflux disease)    Hemorrhoids    Hepatic steatosis    History of radiation therapy 09/07/20-09/22/20   SBRT left lung ; Dr Lynwood Nasuti   HTN (hypertension)    Hyperlipidemia    Kidney stone    Lung cancer [facility]) dx'd 2004   chemo/xrt comp; tarceva  ongoing- January 2022 radiation   Stroke [facility])     ALLERGIES:  is allergic to iohexol , ivp dye [iodinated contrast media], morphine and codeine, other, and povidone iodine.  MEDICATIONS:  Current Outpatient Medications  Medication Sig Dispense Refill   ACCU-CHEK AVIVA PLUS test strip 1 each by Other route daily.     atorvastatin  (LIPITOR) 40 MG tablet Take 40  mg by mouth daily.     Cholecalciferol  (VITAMIN D3) 75 MCG (3000 UT) TABS Take 50 mcg by mouth daily. Takes 50 mcg/day     cyanocobalamin  1000 MCG tablet Take 1,000 mcg by mouth daily.     denosumab  (PROLIA ) 60 MG/ML SOSY injection Inject 60 mg into the skin every 6 (six) months.     erlotinib  (TARCEVA ) 100 MG tablet Take 1 tablet by mouth once daily on an empty stomach, 1 hour before meals or 2 hours after. 30 tablet 10   famotidine  (PEPCID ) 40 MG tablet TAKE 1 TABLET BY MOUTH 2 TIMES DAILY. PLEASE CALL [phone]  SCHEDULE AN OFFICE VISIT FOR REFILLS. 180 tablet 0   ipratropium (ATROVENT ) 0.06 % nasal spray PLACE 2 SPRAYS INTO BOTH NOSTRILS EVERY 6 (SIX) HOURS AS NEEDED FOR RHINITIS. 15 mL 1   JARDIANCE  25 MG TABS tablet Take 25 mg by mouth daily.     Lancets (FREESTYLE) lancets 1 each by Other route daily.     levocetirizine (XYZAL ) 5 MG tablet Take 1 tablet (5 mg total) by mouth daily as needed (Can take an extra dose during flare ups.). 90 tablet 3   levothyroxine  (SYNTHROID ) 25 MCG tablet Take 25 mcg by mouth every morning.     [Paused] losartan  (COZAAR ) 25 MG tablet TAKE 1 TABLET BY MOUTH EVERY DAY FOR 90 DAYS     loteprednol  (LOTEMAX ) 0.5 % ophthalmic suspension Place 1 drop into both eyes daily. 15 mL 5   Magnesium  Glycinate 100 MG CAPS Take 2 capsules Orally nightly     metoprolol  tartrate (LOPRESSOR ) 50 MG tablet TAKE 1 TABLET BY MOUTH TWICE A DAY 180 tablet 3   nitroGLYCERIN  (NITROSTAT ) 0.4 MG SL tablet Place 1 tablet (0.4 mg total) under the tongue every 5 (five) minutes as needed for chest pain (up to 3 doses). 25 tablet 3   nystatin-triamcinolone ointment (MYCOLOG) Apply 1 Application topically as needed. (Patient not taking: Reported on 02/02/2024)     Olopatadine  HCl (PATADAY ) 0.2 % SOLN Place 1 drop into both eyes daily. 8 mL 1   Olopatadine -Mometasone  (RYALTRIS ) 665-25 MCG/ACT SUSP Place 2 sprays into the nose in the morning and at bedtime. 30 g 5   Propylene Glycol, PF, (SYSTANE COMPLETE PF) 0.6 % SOLN Apply 1 drop to eye every 4 (four) hours as needed. 30 mL 1   TRADJENTA  5 MG TABS tablet Take 5 mg by mouth daily.     zaleplon  (SONATA ) 10 MG capsule Take 1 capsule (10 mg total) by mouth at bedtime.     No current facility-administered medications for this visit.    SURGICAL HISTORY:  Past Surgical History:  Procedure Laterality Date   ANGIOPLASTY / STENTING FEMORAL  08/14/2013   2004-groin   CATARACT EXTRACTION     COLONOSCOPY     LEFT HEART CATHETERIZATION WITH CORONARY ANGIOGRAM  N/A 09/08/2013   Procedure: LEFT HEART CATHETERIZATION WITH CORONARY ANGIOGRAM;  Surgeon: Ozell JONETTA Fell, MD;  Location: [facility] CATH LAB;  Service: Cardiovascular;  Laterality: N/A;   left partial lobectomy     WISDOM TOOTH EXTRACTION      REVIEW OF SYSTEMS:   Review of Systems  Constitutional: Positive for fatigue and few pound weight loss. Negative for appetite change, chills, fever and unexpected weight change.  HENT: Positive for hoarseness of the voice. Negative for mouth sores, nosebleeds, sore throat and trouble swallowing.   Eyes: Negative for eye problems and icterus.  Respiratory: Positive for cough. Negative for hemoptysis and  wheezing.   Cardiovascular: Negative for chest pain and leg swelling.  Gastrointestinal: Negative for abdominal pain, constipation, diarrhea, nausea and vomiting.  Genitourinary: Negative for bladder incontinence, difficulty urinating, dysuria, frequency and hematuria.   Musculoskeletal: Negative for back pain, gait problem, neck pain and neck stiffness.  Skin: Negative for itching and rash.  Neurological: Negative for dizziness, extremity weakness, gait problem, headaches, light-headedness and seizures.  Hematological: Negative for adenopathy. Does not bruise/bleed easily.  Psychiatric/Behavioral: Negative for confusion, depression and sleep disturbance. The patient is not nervous/anxious.     PHYSICAL EXAMINATION:  Blood pressure 117/66, pulse 77, temperature (!) 97.3 F (36.3 C), temperature source Temporal, resp. rate 17, weight 188 lb 14.4 oz (85.7 kg), SpO2 96%.  ECOG PERFORMANCE STATUS: 1  Physical Exam  Constitutional: Oriented to person, place, and time and well-developed, well-nourished, and in no distress.  HENT:  Head: Normocephalic and atraumatic.  Mouth/Throat: Oropharynx is clear and moist. No oropharyngeal exudate.  Eyes: positive for Ectropion.Conjunctivae are normal. Right eye exhibits no discharge. Left eye exhibits no discharge. No  scleral icterus.  Neck: Normal range of motion. Neck supple.  Cardiovascular: Normal rate, regular rhythm, normal heart sounds and intact distal pulses.   Pulmonary/Chest: Effort normal and breath sounds normal. No respiratory distress. No wheezes. No rales.  Abdominal: Soft. Bowel sounds are normal. Exhibits no distension and no mass. There is no tenderness.  Musculoskeletal: Normal range of motion. Exhibits no edema.  Lymphadenopathy:    No cervical adenopathy.  Neurological: Alert and oriented to person, place, and time. Exhibits normal muscle tone. Gait normal. Coordination normal.  Skin: Skin is warm and dry. No rash noted. Not diaphoretic. No erythema. No pallor.  Psychiatric: Mood, memory and judgment normal.  Vitals reviewed.  LABORATORY DATA: Lab Results  Component Value Date   WBC 10.0 03/06/2024   HGB 15.6 03/06/2024   HCT 44.4 03/06/2024   MCV 93.5 03/06/2024   PLT 236 03/06/2024      Chemistry      Component Value Date/Time   NA 134 (L) 03/06/2024 1426   NA 139 06/04/2017 0948   K 5.4 (H) 03/06/2024 1426   K 5.1 06/04/2017 0948   CL 102 03/06/2024 1426   CL 106 06/10/2012 0900   CO2 26 03/06/2024 1426   CO2 26 06/04/2017 0948   BUN 20 03/06/2024 1426   BUN 14.2 06/04/2017 0948   CREATININE 1.87 (H) 03/06/2024 1426   CREATININE 1.5 (H) 06/04/2017 0948      Component Value Date/Time   CALCIUM  9.0 03/06/2024 1426   CALCIUM  9.6 06/04/2017 0948   ALKPHOS 75 03/06/2024 1426   ALKPHOS 84 06/04/2017 0948   AST 19 03/06/2024 1426   AST 24 06/04/2017 0948   ALT 30 03/06/2024 1426   ALT 35 06/04/2017 0948   BILITOT 1.2 03/06/2024 1426   BILITOT 0.82 06/04/2017 0948       RADIOGRAPHIC STUDIES:  CT Chest Wo Contrast Result Date: 02/15/2024 CLINICAL DATA:  Non-small cell lung cancer. Assess treatment response. * Tracking Code: BO * EXAM: CT CHEST WITHOUT CONTRAST TECHNIQUE: Multidetector CT imaging of the chest was performed following the standard protocol  without IV contrast. RADIATION DOSE REDUCTION: This exam was performed according to the departmental dose-optimization program which includes automated exposure control, adjustment of the mA and/or kV according to patient size and/or use of iterative reconstruction technique. COMPARISON:  CT 07/11/2023 FINDINGS: Cardiovascular: No significant vascular findings. Normal heart size. No pericardial effusion. Coronary artery calcification and aortic  atherosclerotic calcification. Mediastinum/Nodes: No axillary or supraclavicular adenopathy. No mediastinal or hilar adenopathy. No pericardial fluid. Esophagus normal. Lungs/Pleura: Postsurgical scarring in the LEFT upper lobe. No new nodularity along the resection margin. Band of parenchymal thickening in the RIGHT lower lobe is unchanged. Nodule along the oblique fissure in the RIGHT lower lobe measuring 6 mm (image 71/8) is unchanged. No new pulmonary nodules within the lungs. Centrilobular emphysema in the upper lobes. Upper Abdomen: Limited view of the liver, kidneys, pancreas are unremarkable. Normal adrenal glands. 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 Chronic displaced fracture of the sixth rib (image 71/2 again noted. IMPRESSION: 1. Stable nodular thickening along the LEFT upper lobe resection margin. Favor postsurgical scarring. 2. Stable nodule in the RIGHT lower lobe. 3. No evidence of thoracic metastatic adenopathy. 4. 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 5. Aortic Atherosclerosis (ICD10-I70.0) and Emphysema (ICD10-J43.9). Electronically Signed   By: Jackquline Boxer M.D.   On: 02/15/2024 17:41     ASSESSMENT/PLAN:  This is a very pleasant 73 year old Caucasian male with metastatic non-small cell lung cancer, adenocarcinoma with positive EGFR mutation and he is currently on treatment with Tarceva  since 2006.  This  was a switched to generic Erlotinib  and he is tolerating it well except for mild skin rash   He also underwent SBRT to the enlarging left upper lobe lung nodule. His treatment was switched to erlotinib  100 mg p.o. twice daily in August 2022.   The patient was seen with Dr. Sherrod today.  Dr. Sherrod personally and independently reviewed the scan and discussed results with the patient today.  The scan showed no evidence of disease progression.      Labs were reviewed. Dr. Sherrod recommend he continue on the same treatment at the same dose.   We will send him back to the lab to recheck his potassium today. If elevated, was advised to decrease potassium rich foods in diet.    We will see him back for labs and follow up in 2  months.   Diabetes mellitus Blood sugar elevated at 343 mg/dL, typically around 899 mg/dL. Glimepiride  adjusted to two tablets daily, reducing blood sugar to 156 mg/dL. No recent steroid use. - Monitor blood sugar levels closely at home. - Contact Dr. Jacklynn blood sugar remains high.  Cough Dry cough persists, especially at night. No phlegm, blood, or chest discomfort. Possible phlegm or inflammation in the throat considered. - Consider Mucinex, honey, or warm teas for symptom relief. - Refer to ENT if cough persists with hoarseness.  - Dr. Sherrod offered hycodan but patient declined  The patient was advised to call immediately if he has any concerning symptoms in the interval. The patient voices understanding of current disease status and treatment options and is in agreement with the current care plan. All questions were answered. The patient knows to call the clinic with any problems, questions or concerns. We can certainly see the patient much sooner if necessary       Orders Placed This Encounter  Procedures   Basic Metabolic Panel - Cancer Center Only    Standing Status:   Future    Number of Occurrences:   1    Expected Date:   03/06/2024     Expiration Date:   03/06/2025     Calton CROME Quinlan Mcfall, PA-C 03/06/24  ADDENDUM: Hematology/Oncology Attending: I had a face-to-face encounter with the  patient today.  I reviewed his records, lab, scan and recommended his care plan.  This is a very pleasant 73 years old white male with metastatic non-small cell lung cancer, adenocarcinoma diagnosed in December 2004 status post a course of concurrent chemoradiation at that time followed by left upper lobe wedge resection.  He had disease recurrence in May 2006 and he started treatment with Erlotinib  initially 150 mg p.o. daily until August 2022 when his dose of Erlotinib  was reduced to 100 mg p.o. daily.  The patient has been tolerating this treatment well except for occasional skin rash.  He had repeat CT scan of the chest performed recently.  I personally and independently reviewed the scan and discussed the result with the patient today.  His scan showed no concerning findings for disease recurrence or metastasis.  He was treated recently for pneumonia the week before his scan and no evidence of residual pneumonia seen on the scan. I recommended for him to continue his current treatment with Erlotinib  100 mg p.o. daily.  We will see him back for follow-up visit in 2 months for evaluation and repeat blood work. He was advised to call immediately if he has any other concerning symptoms in the interval. The total time spent in the appointment was 30 minutes including review of chart and various tests results, discussions about plan of care and coordination of care plan . Disclaimer: This note was dictated with voice recognition software. Similar sounding words can inadvertently be transcribed and may be missed upon review. Sherrod MARLA Sherrod, MD

## 2024-03-05 ENCOUNTER — Other Ambulatory Visit: Payer: Self-pay | Admitting: Internal Medicine

## 2024-03-05 DIAGNOSIS — C3412 Malignant neoplasm of upper lobe, left bronchus or lung: Secondary | ICD-10-CM

## 2024-03-06 ENCOUNTER — Inpatient Hospital Stay: Attending: Physician Assistant

## 2024-03-06 ENCOUNTER — Inpatient Hospital Stay: Admitting: Physician Assistant

## 2024-03-06 ENCOUNTER — Inpatient Hospital Stay

## 2024-03-06 VITALS — BP 117/66 | HR 77 | Temp 97.3°F | Resp 17 | Wt 188.9 lb

## 2024-03-06 DIAGNOSIS — R059 Cough, unspecified: Secondary | ICD-10-CM | POA: Diagnosis not present

## 2024-03-06 DIAGNOSIS — E1165 Type 2 diabetes mellitus with hyperglycemia: Secondary | ICD-10-CM | POA: Diagnosis not present

## 2024-03-06 DIAGNOSIS — E875 Hyperkalemia: Secondary | ICD-10-CM | POA: Insufficient documentation

## 2024-03-06 DIAGNOSIS — Z79899 Other long term (current) drug therapy: Secondary | ICD-10-CM | POA: Insufficient documentation

## 2024-03-06 DIAGNOSIS — C3412 Malignant neoplasm of upper lobe, left bronchus or lung: Secondary | ICD-10-CM | POA: Diagnosis present

## 2024-03-06 DIAGNOSIS — Z902 Acquired absence of lung [part of]: Secondary | ICD-10-CM | POA: Diagnosis not present

## 2024-03-06 DIAGNOSIS — Z7984 Long term (current) use of oral hypoglycemic drugs: Secondary | ICD-10-CM | POA: Diagnosis not present

## 2024-03-06 DIAGNOSIS — Z8701 Personal history of pneumonia (recurrent): Secondary | ICD-10-CM | POA: Diagnosis not present

## 2024-03-06 LAB — CBC WITH DIFFERENTIAL (CANCER CENTER ONLY)
Abs Immature Granulocytes: 0.03 K/uL (ref 0.00–0.07)
Basophils Absolute: 0.1 K/uL (ref 0.0–0.1)
Basophils Relative: 1 %
Eosinophils Absolute: 0.3 K/uL (ref 0.0–0.5)
Eosinophils Relative: 3 %
HCT: 44.4 % (ref 39.0–52.0)
Hemoglobin: 15.6 g/dL (ref 13.0–17.0)
Immature Granulocytes: 0 %
Lymphocytes Relative: 19 %
Lymphs Abs: 1.9 K/uL (ref 0.7–4.0)
MCH: 32.8 pg (ref 26.0–34.0)
MCHC: 35.1 g/dL (ref 30.0–36.0)
MCV: 93.5 fL (ref 80.0–100.0)
Monocytes Absolute: 0.7 K/uL (ref 0.1–1.0)
Monocytes Relative: 7 %
Neutro Abs: 7.1 K/uL (ref 1.7–7.7)
Neutrophils Relative %: 70 %
Platelet Count: 236 K/uL (ref 150–400)
RBC: 4.75 MIL/uL (ref 4.22–5.81)
RDW: 13.9 % (ref 11.5–15.5)
WBC Count: 10 K/uL (ref 4.0–10.5)
nRBC: 0 % (ref 0.0–0.2)

## 2024-03-06 LAB — BASIC METABOLIC PANEL - CANCER CENTER ONLY
Anion gap: 5 (ref 5–15)
BUN: 20 mg/dL (ref 8–23)
CO2: 27 mmol/L (ref 22–32)
Calcium: 9.1 mg/dL (ref 8.9–10.3)
Chloride: 102 mmol/L (ref 98–111)
Creatinine: 1.82 mg/dL — ABNORMAL HIGH (ref 0.61–1.24)
GFR, Estimated: 38 mL/min — ABNORMAL LOW (ref >60)
Glucose, Bld: 293 mg/dL — ABNORMAL HIGH (ref 70–99)
Potassium: 5 mmol/L (ref 3.5–5.1)
Sodium: 134 mmol/L — ABNORMAL LOW (ref 135–145)

## 2024-03-06 LAB — CMP (CANCER CENTER ONLY)
ALT: 30 U/L (ref 0–44)
AST: 19 U/L (ref 15–41)
Albumin: 3.9 g/dL (ref 3.5–5.0)
Alkaline Phosphatase: 75 U/L (ref 38–126)
Anion gap: 6 (ref 5–15)
BUN: 20 mg/dL (ref 8–23)
CO2: 26 mmol/L (ref 22–32)
Calcium: 9 mg/dL (ref 8.9–10.3)
Chloride: 102 mmol/L (ref 98–111)
Creatinine: 1.87 mg/dL — ABNORMAL HIGH (ref 0.61–1.24)
GFR, Estimated: 37 mL/min — ABNORMAL LOW (ref >60)
Glucose, Bld: 343 mg/dL — ABNORMAL HIGH (ref 70–99)
Potassium: 5.4 mmol/L — ABNORMAL HIGH (ref 3.5–5.1)
Sodium: 134 mmol/L — ABNORMAL LOW (ref 135–145)
Total Bilirubin: 1.2 mg/dL (ref 0.0–1.2)
Total Protein: 7.2 g/dL (ref 6.5–8.1)

## 2024-03-18 ENCOUNTER — Other Ambulatory Visit: Payer: Self-pay | Admitting: Allergy and Immunology

## 2024-04-11 ENCOUNTER — Other Ambulatory Visit: Payer: Self-pay | Admitting: Allergy and Immunology

## 2024-05-27 ENCOUNTER — Ambulatory Visit (INDEPENDENT_AMBULATORY_CARE_PROVIDER_SITE_OTHER): Admitting: Allergy and Immunology

## 2024-05-27 ENCOUNTER — Other Ambulatory Visit: Payer: Self-pay | Admitting: Allergy and Immunology

## 2024-05-27 VITALS — BP 128/62 | HR 85 | Temp 98.3°F | Resp 14 | Ht 73.0 in | Wt 193.4 lb

## 2024-05-27 DIAGNOSIS — J3089 Other allergic rhinitis: Secondary | ICD-10-CM

## 2024-05-27 DIAGNOSIS — H10503 Unspecified blepharoconjunctivitis, bilateral: Secondary | ICD-10-CM | POA: Diagnosis not present

## 2024-05-27 DIAGNOSIS — L42 Pityriasis rosea: Secondary | ICD-10-CM | POA: Diagnosis not present

## 2024-05-27 MED ORDER — RYALTRIS 665-25 MCG/ACT NA SUSP: 2.0000 | Freq: Two times a day (BID) | NASAL | 5 refills | Status: AC

## 2024-05-27 MED ORDER — OLOPATADINE HCL 0.2 % OP SOLN: 1.0000 [drp] | Freq: Every day | OPHTHALMIC | 1 refills | Status: AC

## 2024-05-27 MED ORDER — DOXYCYCLINE MONOHYDRATE 100 MG PO CAPS: 100.0000 mg | ORAL_CAPSULE | Freq: Every day | ORAL | 2 refills | Status: AC

## 2024-05-27 MED ORDER — IPRATROPIUM BROMIDE 0.06 % NA SOLN: 2.0000 | Freq: Four times a day (QID) | NASAL | 1 refills | Status: DC | PRN

## 2024-05-27 MED ORDER — LOTEPREDNOL ETABONATE 0.5 % OP SUSP: 2.0000 [drp] | Freq: Every day | OPHTHALMIC | 5 refills | Status: AC

## 2024-05-27 NOTE — Patient Instructions (Addendum)
  1.  INCREASE Ryaltris  - 2 sprays each nostril 1 time per day  2.  Continue Doxycycline  100 mg - 1 tablet 1 time per day  3.  If needed:   A. OTC antihistamine  B. OTC Systane eye drops - especially at bedtime and upon awaking  C. Pataday  - 1 drop each eye 1 time per day  D. INCREASE Lotemax  - 1 drop each eye 2 times per day  E. Ipratropium 0.06% - 2 sprays each nostril every 6 hours to dry nose  4. Return in 12 weeks or earlier if problem  5. Influenza = Tamiflu. Covid = Paxlovid

## 2024-05-27 NOTE — Progress Notes (Unsigned)
 [place] [facility] [place] [facility] [place] - [place] [facility] [place]   Follow-up Note  Referring Provider: [name], DO Primary Provider: [name], DO Date of Office Visit: 05/27/2024  Subjective:   [name] (DOB: 22-Jan-1951) is a 73 y.o. male who [facility] in re-evaluation of the following:  HPI: Signe returns to this clinic in evaluation of allergic rhinoconjunctivitis, blepharoconjunctivitis, phantosmia.  I last saw him in this clinic 20 November 2023.  He believes that over the course of the past month or maybe 2 he has been having a little bit more problems with redness and swelling and irritation of his eyes.  When he wakes up in the morning he has very granular material in his eyes.  He has been using his Lotemax  once a day he has been using doxycycline  once a day and Pataday  once a day.  He apparently stopped his doxycycline  after he had a bout of sepsis from a pneumonia in June 2025 and that is to the point in time in which his eyes flared up and he went back on doxycycline  and has been back on doxycycline  for about 2 months.  His nose is doing pretty well.  Occasionally has a little bit of runny nose.  He does not have any phantosmia.  He is acknowledging his lung cancer diagnosis made 21 years ago.  Allergies as of 05/27/2024       Reactions   Iohexol  Other (See Comments)    Code: HIVES, Desc: pt recieves 50mg  benadryl  1 hour prior to scan   Ivp Dye [iodinated Contrast Media] Rash   Morphine And Codeine Anxiety   hallucinations   Other Rash   chlorascrub when starting IV   Povidone Iodine Rash   Other reaction(s): heated rash        Medication List    Accu-Chek Aviva Plus test strip Generic drug: glucose blood 1 each by Other route daily.   atorvastatin  40 MG tablet Commonly known as: LIPITOR Take 40 mg by mouth daily.   cyanocobalamin  1000 MCG tablet Take 1,000 mcg by mouth daily.   erlotinib  100 MG  tablet Commonly known as: TARCEVA  Take 1 tablet by mouth once daily on an empty stomach, 1 hour before meals or 2 hours after.   famotidine  40 MG tablet Commonly known as: PEPCID  TAKE 1 TABLET BY MOUTH 2 TIMES DAILY. PLEASE CALL [phone] SCHEDULE AN OFFICE VISIT FOR REFILLS.   freestyle lancets 1 each by Other route daily.   ipratropium 0.06 % nasal spray Commonly known as: ATROVENT  PLACE 2 SPRAYS INTO BOTH NOSTRILS EVERY 6 (SIX) HOURS AS NEEDED FOR RHINITIS.   Jardiance  25 MG Tabs tablet Generic drug: empagliflozin  Take 25 mg by mouth daily.   levocetirizine 5 MG tablet Commonly known as: XYZAL  Take 1 tablet (5 mg total) by mouth daily as needed (Can take an extra dose during flare ups.).   levothyroxine  25 MCG tablet Commonly known as: SYNTHROID  Take 25 mcg by mouth every morning.   loteprednol  0.5 % ophthalmic suspension Commonly known as: Lotemax  Place 1 drop into both eyes daily.   Magnesium  Glycinate 100 MG Caps Take 2 capsules Orally nightly   metoprolol  tartrate 50 MG tablet Commonly known as: LOPRESSOR  TAKE 1 TABLET BY MOUTH TWICE A DAY   nitroGLYCERIN  0.4 MG SL tablet Commonly known as: Nitrostat  Place 1 tablet (0.4 mg total) under the tongue every 5 (five) minutes as needed for chest pain (up  to 3 doses).   nystatin-triamcinolone ointment Commonly known as: MYCOLOG Apply 1 Application topically as needed.   Olopatadine  HCl 0.2 % Soln Commonly known as: Pataday  Place 1 drop into both eyes daily.   Prolia  60 MG/ML Sosy injection Generic drug: denosumab  Inject 60 mg into the skin every 6 (six) months.   Ryaltris  334-74 MCG/ACT Susp Generic drug: Olopatadine -Mometasone  Place 2 sprays into the nose in the morning and at bedtime.   Systane Complete PF 0.6 % Soln Generic drug: Propylene Glycol (PF) Apply 1 drop to eye every 4 (four) hours as needed.   Tradjenta  5 MG Tabs tablet Generic drug: linagliptin  Take 5 mg by mouth daily.   Vitamin D3  75 MCG (3000 UT) Tabs Take 50 mcg by mouth daily. Takes 50 mcg/day   zaleplon  10 MG capsule Commonly known as: SONATA  Take 1 capsule (10 mg total) by mouth at bedtime.    Past Medical History:  Diagnosis Date   AAA (abdominal aortic aneurysm)    a. 3.4 x 3.5 on scan 02/28/13.   Allergic reaction to contrast dye    Atherosclerosis    CAD (coronary artery disease)    a. stenting of RCA/LCx 2004. b. [place]  08/2013:  s/p DES to LCx for severe ISR.   Diabetes mellitus    Diverticulosis    Emphysema lung (HCC)    Emphysema of lung (HCC)    Encounter for therapeutic drug monitoring 06/06/2016   GERD (gastroesophageal reflux disease)    Hemorrhoids    Hepatic steatosis    History of radiation therapy 09/07/20-09/22/20   SBRT left lung ; Dr [name] Nasuti   HTN (hypertension)    Hyperlipidemia    Kidney stone    Lung cancer [facility]) dx'd 2004   chemo/xrt comp; tarceva  ongoing- January 2022 radiation   Stroke [facility])     Past Surgical History:  Procedure Laterality Date   ANGIOPLASTY / STENTING FEMORAL  08/14/2013   2004-groin   CATARACT EXTRACTION     COLONOSCOPY     LEFT HEART CATHETERIZATION WITH CORONARY ANGIOGRAM N/A 09/08/2013   Procedure: LEFT HEART CATHETERIZATION WITH CORONARY ANGIOGRAM;  Surgeon: Ozell JONETTA Fell, MD;  Location: [facility] CATH LAB;  Service: Cardiovascular;  Laterality: N/A;   left partial lobectomy     WISDOM TOOTH EXTRACTION      Review of systems negative except as noted in HPI / PMHx or noted below:  Review of Systems  Constitutional: Negative.   HENT: Negative.    Eyes: Negative.   Respiratory: Negative.    Cardiovascular: Negative.   Gastrointestinal: Negative.   Genitourinary: Negative.   Musculoskeletal: Negative.   Skin: Negative.   Neurological: Negative.   Endo/Heme/Allergies: Negative.   Psychiatric/Behavioral: Negative.       Objective:   Physical Exam Constitutional:      Appearance: He is not diaphoretic.  HENT:     Head:  Normocephalic.     Right Ear: Tympanic membrane, ear canal and external ear normal.     Left Ear: Tympanic membrane, ear canal and external ear normal.     Nose: Nose normal. No mucosal edema or rhinorrhea.     Mouth/Throat:     Pharynx: Uvula midline. No oropharyngeal exudate.  Eyes:     Conjunctiva/sclera:     Right eye: Right conjunctiva is injected.     Left eye: Left conjunctiva is injected.     Comments: Thickened red upper and lower lid borders  Neck:     Thyroid: No thyromegaly.  Trachea: Trachea normal. No tracheal tenderness or tracheal deviation.  Cardiovascular:     Rate and Rhythm: Normal rate and regular rhythm.     Heart sounds: Normal heart sounds, S1 normal and S2 normal. No murmur heard. Pulmonary:     Effort: No respiratory distress.     Breath sounds: Normal breath sounds. No stridor. No wheezing or rales.  Lymphadenopathy:     Head:     Right side of head: No tonsillar adenopathy.     Left side of head: No tonsillar adenopathy.     Cervical: No cervical adenopathy.  Skin:    Findings: No erythema or rash.     Nails: There is no clubbing.  Neurological:     Mental Status: He is alert.     Diagnostics: none  Assessment and Plan:   1. Perennial allergic rhinitis   2. Blepharoconjunctivitis of both eyes, unspecified blepharoconjunctivitis type   3. Pityriasis rosea     1.  INCREASE Ryaltris  - 2 sprays each nostril 1 time per day  2.  Continue Doxycycline  100 mg - 1 tablet 1 time per day  3.  INCREASE Lotemax  - 1 drop each eye 2 times per day  4.  If needed:   A. OTC antihistamine  B. OTC Systane eye drops - especially at bedtime and upon awaking  C. Pataday  - 1 drop each eye 1 time per day  D. Ipratropium 0.06% - 2 sprays each nostril every 6 hours to dry nose  6. Return in 12 weeks or earlier if problem  6. Influenza = Tamiflu. Covid = Paxlovid  Signe is having a little more inflammation of his conjunctiva/eyelids and I am going to have  him increase his Lotemax  to twice a day for the next 12 weeks while he continues on doxycycline  and we are also going to have him increase his Ryaltris  nose spray with the hope that it also affects his eyes.  I have encouraged him to use lots of thick Systane at nighttime before bed and then use regular Systane upon awakening in the morning.  Will see him back in this clinic in 12 weeks.  Camellia Denis, MD Allergy  / Immunology [facility]

## 2024-05-28 ENCOUNTER — Encounter: Payer: Self-pay | Admitting: Allergy and Immunology

## 2024-06-13 ENCOUNTER — Telehealth: Payer: Self-pay | Admitting: Internal Medicine

## 2024-06-13 MED ORDER — FAMOTIDINE 40 MG PO TABS: 40.0000 mg | ORAL_TABLET | Freq: Two times a day (BID) | ORAL | 0 refills | Status: DC

## 2024-06-13 NOTE — Telephone Encounter (Signed)
 Inbound call from patient requesting refill for Pepcid . Patient has been scheduled with Ellouise 12/17 at 10:20. Please advise.

## 2024-07-14 MED ORDER — FAMOTIDINE 40 MG PO TABS: 40.0000 mg | ORAL_TABLET | Freq: Two times a day (BID) | ORAL | 0 refills | Status: AC

## 2024-07-14 NOTE — Telephone Encounter (Signed)
 Inbound call from patient requesting to know the status of his request. Patient would also like a call back to further advise him that his medication was indeed sent over to his pharmacy. Please advise.

## 2024-07-14 NOTE — Telephone Encounter (Signed)
 Pepcid  sent to [facility]

## 2024-07-14 NOTE — Addendum Note (Signed)
 Addended by: Kenetha Cozza K on: 07/14/2024 10:35 AM   Modules accepted: Orders

## 2024-07-30 ENCOUNTER — Ambulatory Visit: Admitting: Physician Assistant

## 2024-07-30 ENCOUNTER — Encounter: Payer: Self-pay | Admitting: Physician Assistant

## 2024-07-30 VITALS — BP 122/70 | HR 69 | Ht 73.0 in | Wt 193.0 lb

## 2024-07-30 DIAGNOSIS — K222 Esophageal obstruction: Secondary | ICD-10-CM

## 2024-07-30 DIAGNOSIS — K2100 Gastro-esophageal reflux disease with esophagitis, without bleeding: Secondary | ICD-10-CM | POA: Diagnosis not present

## 2024-07-30 MED ORDER — FAMOTIDINE 40 MG PO TABS: 40.0000 mg | ORAL_TABLET | Freq: Two times a day (BID) | ORAL | 3 refills | Status: AC

## 2024-07-30 NOTE — Patient Instructions (Signed)
 We have sent the following medications to your pharmacy for you to pick up at your convenience: Famotidine  40 mg one tablet twice daily   Please follow up sooner if symptoms increase or worsen  Due to recent changes in healthcare laws, you may see the results of your imaging and laboratory studies on MyChart before your provider has had a chance to review them.  We understand that in some cases there may be results that are confusing or concerning to you. Not all laboratory results come back in the same time frame and the provider may be waiting for multiple results in order to interpret others.  Please give us  48 hours in order for your provider to thoroughly review all the results before contacting the office for clarification of your results.   Thank you for trusting me with your gastrointestinal care!   Ellouise Console, PA-C _______________________________________________________  If your blood pressure at your visit was 140/90 or greater, please contact your primary care physician to follow up on this.  _______________________________________________________  If you are age 73 or older, your body mass index should be between 23-30. Your Body mass index is 25.46 kg/m. If this is out of the aforementioned range listed, please consider follow up with your Primary Care Provider.  If you are age 2 or younger, your body mass index should be between 19-25. Your Body mass index is 25.46 kg/m. If this is out of the aformentioned range listed, please consider follow up with your Primary Care Provider.   ________________________________________________________  The [facility] providers would like to encourage you to use MYCHART to communicate with providers for non-urgent requests or questions.  Due to long hold times on the telephone, sending your provider a message by [facility] may be a faster and more efficient way to get a response.  Please allow 48 business hours for a response.  Please remember  that this is for non-urgent requests.  _______________________________________________________

## 2024-07-30 NOTE — Progress Notes (Signed)
 Ellouise Console, PA-C [address] [place], [place]  [address] Phone: [phone]   Primary Care Physician: [name], DO  Primary Gastroenterologist:  Ellouise Console, PA-C / Norleen Kiang, MD   Chief Complaint: Follow-up acid reflux       HPI:   Discussed the use of AI scribe software for clinical note transcription with the patient, who gave verbal consent to proceed. History of Present Illness [name] is a 73 year old male with history of GERD and esophageal stricture who presents for follow-up of acid reflux.  Gastroesophageal reflux disease (gerd) and esophageal stricture - History of GERD and esophageal stricture with previous esophageal dilation procedures performed twice, most recently in 2022 - Dysphagia improved by approximately 75% since initiation of treatment - Previously treated with omeprazole, switched to famotidine  40 mg twice daily due to potential drug interactions with Tarceva  - Medication schedule adjusted to minimize drug interactions: famotidine  taken first thing in the morning, followed by other medications after one hour, and Tarceva  after an additional hour - Symptoms were well-controlled on famotidine  once daily until recent worsening, leading to resumption of twice-daily dosing - Recent lapse in medication supply resulted in recurrence of symptoms, including sensation of solid food dysphagia particularly with stringy foods such as chicken or beef - Adopts slow eating and avoidance of certain foods to manage symptoms  Dysphagia - Sensation of solid food, especially with stringy foods like chicken or beef - Sticks in lower esophagus, epigastrium. - Dysphagia improved post esophageal dilation in 2022. - He has no dysphagia if he takes famotidine  40 mg twice daily. - He notices recurrent dysphagia if he does not take famotidine . - Manages symptoms by eating slowly and avoiding problematic foods - Denies vomiting, food bolus episodes, or  weight loss.  Colon cancer screening - Screening colonoscopy in September 2016 revealed sigmoid diverticulosis - No polyps identified - No rectal bleeding, constipation, or diarrhea.  Denies lower GI symptoms. - 10-year repeat screening colonoscopy will be due 04/2025.     Current Outpatient Medications  Medication Sig Dispense Refill   ACCU-CHEK AVIVA PLUS test strip 1 each by Other route daily.     aspirin  81 MG chewable tablet Chew by mouth daily.     atorvastatin  (LIPITOR) 40 MG tablet Take 40 mg by mouth daily.     Cholecalciferol  (VITAMIN D3) 75 MCG (3000 UT) TABS Take 50 mcg by mouth daily. Takes 50 mcg/day     cyanocobalamin  1000 MCG tablet Take 1,000 mcg by mouth daily.     denosumab  (PROLIA ) 60 MG/ML SOSY injection Inject 60 mg into the skin every 6 (six) months.     doxycycline  (MONODOX ) 100 MG capsule Take 1 capsule (100 mg total) by mouth daily. 30 capsule 2   erlotinib  (TARCEVA ) 100 MG tablet Take 1 tablet by mouth once daily on an empty stomach, 1 hour before meals or 2 hours after. 30 tablet 10   ipratropium (ATROVENT ) 0.06 % nasal spray PLACE 2 SPRAYS INTO BOTH NOSTRILS EVERY 6 (SIX) HOURS AS NEEDED FOR RHINITIS. 15 mL 6   JARDIANCE  25 MG TABS tablet Take 25 mg by mouth daily.     Lancets (FREESTYLE) lancets 1 each by Other route daily.     levocetirizine (XYZAL ) 5 MG tablet Take 1 tablet (5 mg total) by mouth daily as needed (Can take an extra dose during flare ups.). 90 tablet 3   levothyroxine  (SYNTHROID ) 25 MCG tablet Take 25 mcg  by mouth every morning.     [Paused] losartan  (COZAAR ) 25 MG tablet TAKE 1 TABLET BY MOUTH EVERY DAY FOR 90 DAYS     loteprednol  (LOTEMAX ) 0.5 % ophthalmic suspension Place 2 drops into both eyes daily. 30 mL 5   Magnesium  Glycinate 100 MG CAPS Take 2 capsules Orally nightly     metoprolol  tartrate (LOPRESSOR ) 50 MG tablet TAKE 1 TABLET BY MOUTH TWICE A DAY 180 tablet 3   nitroGLYCERIN  (NITROSTAT ) 0.4 MG SL tablet Place 1 tablet (0.4 mg  total) under the tongue every 5 (five) minutes as needed for chest pain (up to 3 doses). 25 tablet 3   nystatin-triamcinolone ointment (MYCOLOG) Apply 1 Application topically as needed.     Olopatadine  HCl (PATADAY ) 0.2 % SOLN Place 1 drop into both eyes daily. 8 mL 1   Olopatadine -Mometasone  (RYALTRIS ) 665-25 MCG/ACT SUSP Place 2 sprays into the nose in the morning and at bedtime. 30 g 5   Propylene Glycol, PF, (SYSTANE COMPLETE PF) 0.6 % SOLN Apply 1 drop to eye every 4 (four) hours as needed. 30 mL 1   TRADJENTA  5 MG TABS tablet Take 5 mg by mouth daily.     zaleplon  (SONATA ) 10 MG capsule Take 1 capsule (10 mg total) by mouth at bedtime.     famotidine  (PEPCID ) 40 MG tablet Take 1 tablet (40 mg total) by mouth 2 (two) times daily. 180 tablet 3   No current facility-administered medications for this visit.    Allergies as of 07/30/2024 - Review Complete 07/30/2024  Allergen Reaction Noted   Iohexol  Other (See Comments) 10/09/2006   Ivp dye [iodinated contrast media] Rash 06/23/2021   Morphine and codeine Anxiety 09/08/2013   Other Rash 10/14/2013   Povidone iodine Rash 11/25/2020    Past Medical History:  Diagnosis Date   AAA (abdominal aortic aneurysm)    a. 3.4 x 3.5 on scan 02/28/13.   Allergic reaction to contrast dye    Atherosclerosis    CAD (coronary artery disease)    a. stenting of RCA/LCx 2004. b. [place]  08/2013:  s/p DES to LCx for severe ISR.   Diabetes mellitus    Diverticulosis    Emphysema lung (HCC)    Emphysema of lung (HCC)    Encounter for therapeutic drug monitoring 06/06/2016   GERD (gastroesophageal reflux disease)    Hemorrhoids    Hepatic steatosis    History of radiation therapy 09/07/20-09/22/20   SBRT left lung ; Dr Lynwood Nasuti   HTN (hypertension)    Hyperlipidemia    Kidney stone    Lung cancer [facility]) dx'd 2004   chemo/xrt comp; tarceva  ongoing- January 2022 radiation   Stroke [facility])     Past Surgical History:  Procedure Laterality Date    ANGIOPLASTY / STENTING FEMORAL  08/14/2013   2004-groin   CATARACT EXTRACTION     COLONOSCOPY     LEFT HEART CATHETERIZATION WITH CORONARY ANGIOGRAM N/A 09/08/2013   Procedure: LEFT HEART CATHETERIZATION WITH CORONARY ANGIOGRAM;  Surgeon: Ozell JONETTA Fell, MD;  Location: [facility] CATH LAB;  Service: Cardiovascular;  Laterality: N/A;   left partial lobectomy     WISDOM TOOTH EXTRACTION      Review of Systems:    All systems reviewed and negative except where noted in HPI.    Physical Exam:  BP 122/70   Pulse 69   Ht 6' 1 (1.854 m)   Wt 193 lb (87.5 kg)   BMI 25.46 kg/m  No LMP for male  patient.  General: Well-nourished, well-developed in no acute distress.  Neuro: Alert and oriented x 3.  Grossly intact.  Psych: Alert and cooperative, normal mood and affect.   Imaging Studies: No results found.  Labs: CBC    Component Value Date/Time   WBC 10.0 03/06/2024 1426   WBC 8.1 02/05/2024 0553   RBC 4.75 03/06/2024 1426   HGB 15.6 03/06/2024 1426   HGB 12.9 (L) 06/04/2017 0948   HCT 44.4 03/06/2024 1426   HCT 38.1 (L) 06/04/2017 0948   PLT 236 03/06/2024 1426   PLT 218 06/04/2017 0948   MCV 93.5 03/06/2024 1426   MCV 87.4 06/04/2017 0948   MCH 32.8 03/06/2024 1426   MCHC 35.1 03/06/2024 1426   RDW 13.9 03/06/2024 1426   RDW 14.1 06/04/2017 0948   LYMPHSABS 1.9 03/06/2024 1426   LYMPHSABS 2.1 06/04/2017 0948   MONOABS 0.7 03/06/2024 1426   MONOABS 0.9 06/04/2017 0948   EOSABS 0.3 03/06/2024 1426   EOSABS 0.2 06/04/2017 0948   BASOSABS 0.1 03/06/2024 1426   BASOSABS 0.0 06/04/2017 0948    CMP     Component Value Date/Time   NA 134 (L) 03/06/2024 1528   NA 139 06/04/2017 0948   K 5.0 03/06/2024 1528   K 5.1 06/04/2017 0948   CL 102 03/06/2024 1528   CL 106 06/10/2012 0900   CO2 27 03/06/2024 1528   CO2 26 06/04/2017 0948   GLUCOSE 293 (H) 03/06/2024 1528   GLUCOSE 152 (H) 06/04/2017 0948   GLUCOSE 194 (H) 06/10/2012 0900   BUN 20 03/06/2024 1528   BUN 14.2  06/04/2017 0948   CREATININE 1.82 (H) 03/06/2024 1528   CREATININE 1.5 (H) 06/04/2017 0948   CALCIUM  9.1 03/06/2024 1528   CALCIUM  9.6 06/04/2017 0948   PROT 7.2 03/06/2024 1426   PROT 7.0 06/04/2017 0948   ALBUMIN 3.9 03/06/2024 1426   ALBUMIN 3.7 06/04/2017 0948   AST 19 03/06/2024 1426   AST 24 06/04/2017 0948   ALT 30 03/06/2024 1426   ALT 35 06/04/2017 0948   ALKPHOS 75 03/06/2024 1426   ALKPHOS 84 06/04/2017 0948   BILITOT 1.2 03/06/2024 1426   BILITOT 0.82 06/04/2017 0948   GFRNONAA 38 (L) 03/06/2024 1528   GFRAA 39 (L) 12/16/2019 1103       Assessment and Plan:   [name] is a 73 y.o. y/o male returns for follow-up of: Assessment & Plan 1.  Gastroesophageal reflux disease with esophagitis and benign esophageal stricture: Improved on twice daily famotidine .  Chronic GERD with erosive esophagitis and benign esophageal stricture. Symptoms improved with famotidine .  Dysphagia improved post dilation and on medication. - Refilled famotidine  40 mg 1 tablet twice daily with refills for one year. - Advised to monitor symptoms and report if dysphagia persists. - Will consider endoscopy if symptoms worsen or persist. - I offered to schedule an upper endoscopy, however patient declined to schedule EGD today. - Recommend Lifestyle Modifications to prevent Acid Reflux.  Rec. Avoid coffee, sodas, peppermint, garlic, onions, alcohol , citrus fruits, chocolate, tomatoes, fatty and spicey foods.  Avoid eating 2-3 hours before bedtime.    2.  Colon cancer screening Sigmoid diverticulosis noted on colonoscopy in September 2016. No polyps found. Next colonoscopy due in September 2026. - Plan for repeat screening colonoscopy in September 2026. - Follow-up sooner if he develops symptoms such as rectal bleeding or changes in bowel habits.   Ellouise Console, PA-C  Follow up September 2026 for repeat colonoscopy and  possible EGD with dilation.  Follow-up sooner if worsening GI  symptoms.

## 2024-07-30 NOTE — Progress Notes (Signed)
 Noted

## 2024-08-12 ENCOUNTER — Other Ambulatory Visit: Payer: Self-pay | Admitting: Physician Assistant

## 2024-08-12 DIAGNOSIS — C3412 Malignant neoplasm of upper lobe, left bronchus or lung: Secondary | ICD-10-CM

## 2024-08-13 ENCOUNTER — Telehealth: Payer: Self-pay | Admitting: Internal Medicine

## 2024-08-13 NOTE — Telephone Encounter (Signed)
 Left the patient a voicemail with the scheduled appointments around the CT scan.

## 2024-08-19 ENCOUNTER — Encounter: Payer: Self-pay | Admitting: Allergy and Immunology

## 2024-08-19 ENCOUNTER — Ambulatory Visit: Admitting: Allergy and Immunology

## 2024-08-19 ENCOUNTER — Other Ambulatory Visit: Payer: Self-pay

## 2024-08-19 VITALS — BP 102/62 | HR 75 | Temp 97.8°F | Resp 14 | Ht 73.0 in | Wt 193.9 lb

## 2024-08-19 DIAGNOSIS — H10503 Unspecified blepharoconjunctivitis, bilateral: Secondary | ICD-10-CM

## 2024-08-19 DIAGNOSIS — J3089 Other allergic rhinitis: Secondary | ICD-10-CM | POA: Diagnosis not present

## 2024-08-19 NOTE — Progress Notes (Unsigned)
 "  [place] - [place] - [place] - [place]   Follow-up Note  Referring Provider: [name], DO Primary Provider: [name], DO Date of Office Visit: 08/19/2024  Subjective:   [name] (DOB: 1951/03/03) is a 74 y.o. male who [facility] in re-evaluation of the following:  HPI: [name] returns to this clinic in evaluation of allergic rhinoconjunctivitis, blepharoconjunctivitis, and history of lung cancer treated with erlotinib  .  I last saw him in this clinic 27 May 2024.  During his last visit he mentioned that he thought his conjunctival disease was getting worse even though he continued on doxycycline  and continued on Lotemax  once a day.  We increased his Lotemax  to twice a day we had him also increase his nasal steroid.  He is not really that much better.  He still has irritation and burning in stinging of his eyes.  Allergies as of 08/19/2024       Reactions   Iohexol  Other (See Comments)    Code: HIVES, Desc: pt recieves 50mg  benadryl  1 hour prior to scan   Ivp Dye [iodinated Contrast Media] Rash   Morphine And Codeine Anxiety   hallucinations   Other Rash   chlorascrub when starting IV   Povidone Iodine Rash   Other reaction(s): heated rash        Medication List    Accu-Chek Aviva Plus test strip Generic drug: glucose blood 1 each by Other route daily.   albuterol  108 (90 Base) MCG/ACT inhaler Commonly known as: VENTOLIN  HFA Inhale 2 puffs into the lungs every 4 (four) hours as needed for shortness of breath or wheezing.   aspirin  81 MG chewable tablet Chew by mouth daily.   atorvastatin  40 MG tablet Commonly known as: LIPITOR Take 40 mg by mouth daily.   cyanocobalamin  1000 MCG tablet Take 1,000 mcg by mouth daily.   doxycycline  100 MG capsule Commonly known as: MONODOX  Take 1 capsule (100 mg total) by mouth daily.   erlotinib  100 MG tablet Commonly known as: TARCEVA  Take 1  tablet by mouth once daily on an empty stomach, 1 hour before meals or 2 hours after.   famotidine  40 MG tablet Commonly known as: PEPCID  Take 1 tablet (40 mg total) by mouth 2 (two) times daily.   freestyle lancets 1 each by Other route daily.   ipratropium 0.06 % nasal spray Commonly known as: ATROVENT  PLACE 2 SPRAYS INTO BOTH NOSTRILS EVERY 6 (SIX) HOURS AS NEEDED FOR RHINITIS.   Jardiance  25 MG Tabs tablet Generic drug: empagliflozin  Take 25 mg by mouth daily.   levocetirizine 5 MG tablet Commonly known as: XYZAL  Take 1 tablet (5 mg total) by mouth daily as needed (Can take an extra dose during flare ups.).   levothyroxine  25 MCG tablet Commonly known as: SYNTHROID  Take 25 mcg by mouth every morning.   loteprednol  0.5 % ophthalmic suspension Commonly known as: Lotemax  Place 2 drops into both eyes daily.   Magnesium  Glycinate 100 MG Caps Take 2 capsules Orally nightly   metoprolol  tartrate 50 MG tablet Commonly known as: LOPRESSOR  TAKE 1 TABLET BY MOUTH TWICE A DAY   nitroGLYCERIN  0.4 MG SL tablet Commonly known as: Nitrostat  Place 1 tablet (0.4 mg total) under the tongue every 5 (five) minutes as needed for chest pain (up to 3 doses).   nystatin-triamcinolone ointment Commonly known as: MYCOLOG Apply 1 Application topically as needed.   Olopatadine  HCl 0.2 % Soln  Commonly known as: Pataday  Place 1 drop into both eyes daily.   Prolia  60 MG/ML Sosy injection Generic drug: denosumab  Inject 60 mg into the skin every 6 (six) months.   Ryaltris  665-25 MCG/ACT Susp Generic drug: Olopatadine -Mometasone  Place 2 sprays into the nose in the morning and at bedtime.   Systane Complete PF 0.6 % Soln Generic drug: Propylene Glycol (PF) Apply 1 drop to eye every 4 (four) hours as needed.   Tradjenta  5 MG Tabs tablet Generic drug: linagliptin  Take 5 mg by mouth daily.   Vitamin D3 75 MCG (3000 UT) Tabs Take 50 mcg by mouth daily. Takes 50 mcg/day   zaleplon  10 MG  capsule Commonly known as: SONATA  Take 1 capsule (10 mg total) by mouth at bedtime.    Past Medical History:  Diagnosis Date   AAA (abdominal aortic aneurysm)    a. 3.4 x 3.5 on scan 02/28/13.   Allergic reaction to contrast dye    Atherosclerosis    CAD (coronary artery disease)    a. stenting of RCA/LCx 2004. b. [place]  08/2013:  s/p DES to LCx for severe ISR.   Diabetes mellitus    Diverticulosis    Emphysema lung (HCC)    Emphysema of lung (HCC)    Encounter for therapeutic drug monitoring 06/06/2016   GERD (gastroesophageal reflux disease)    Hemorrhoids    Hepatic steatosis    History of radiation therapy 09/07/20-09/22/20   SBRT left lung ; Dr [name] Nasuti   HTN (hypertension)    Hyperlipidemia    Kidney stone    Lung cancer [facility]) dx'd 2004   chemo/xrt comp; tarceva  ongoing- January 2022 radiation   Stroke [facility])     Past Surgical History:  Procedure Laterality Date   ANGIOPLASTY / STENTING FEMORAL  08/14/2013   2004-groin   CATARACT EXTRACTION     COLONOSCOPY     LEFT HEART CATHETERIZATION WITH CORONARY ANGIOGRAM N/A 09/08/2013   Procedure: LEFT HEART CATHETERIZATION WITH CORONARY ANGIOGRAM;  Surgeon: Ozell JONETTA Fell, MD;  Location: [facility] CATH LAB;  Service: Cardiovascular;  Laterality: N/A;   left partial lobectomy     WISDOM TOOTH EXTRACTION      Review of systems negative except as noted in HPI / PMHx or noted below:  Review of Systems  Constitutional: Negative.   HENT: Negative.    Eyes: Negative.   Respiratory: Negative.    Cardiovascular: Negative.   Gastrointestinal: Negative.   Genitourinary: Negative.   Musculoskeletal: Negative.   Skin: Negative.   Neurological: Negative.   Endo/Heme/Allergies: Negative.   Psychiatric/Behavioral: Negative.       Objective:   Vitals:   08/19/24 1332  BP: 102/62  Pulse: 75  Resp: 14  Temp: 97.8 F (36.6 C)  SpO2: 97%   Height: 6' 1 (185.4 cm)  Weight: 193 lb 14.4 oz (88 kg)   Physical Exam Eyes:      Conjunctiva/sclera:     Right eye: Right conjunctiva is injected.     Left eye: Left conjunctiva is injected.     Comments: Thickened erythematous upper and lower lid borders bilaterally     Diagnostics: none  Assessment and Plan:   1. Perennial allergic rhinitis   2. Blepharoconjunctivitis of both eyes, unspecified blepharoconjunctivitis type    1.  Continue Ryaltris  - 2 sprays each nostril 1 time per day  2.  Continue Doxycycline  100 mg - 1 tablet 1 time per day  3.  Continue Lotemax  - 1 drop each eye 2  times per day  4.  If needed:   A. OTC antihistamine  B. OTC Systane eye drops - especially at bedtime and upon awaking  C. Pataday  - 1 drop each eye 1 time per day  D. Ipratropium 0.06% - 2 sprays each nostril every 6 hours to dry nose  5. [facility]  6. Influenza = Tamiflu. Covid = Paxlovid  I think we need to have [facility] regarding further management of his significant inflammatory process affecting his blepharoconjunctivitis.  Will see if we can get him to visit with [facility] or [facility] as he does live on that side of [facility]  Camellia Denis, MD Allergy  / Immunology [facility] "

## 2024-08-19 NOTE — Patient Instructions (Addendum)
" °  1.  Continue Ryaltris  - 2 sprays each nostril 1 time per day  2.  Continue Doxycycline  100 mg - 1 tablet 1 time per day  3.  Continue Lotemax  - 1 drop each eye 2 times per day  4.  If needed:   A. OTC antihistamine  B. OTC Systane eye drops - especially at bedtime and upon awaking  C. Pataday  - 1 drop each eye 1 time per day  D. Ipratropium 0.06% - 2 sprays each nostril every 6 hours to dry nose  5. [facility]  6. Influenza = Tamiflu. Covid = Paxlovid "

## 2024-08-20 ENCOUNTER — Encounter: Payer: Self-pay | Admitting: Allergy and Immunology

## 2024-08-21 ENCOUNTER — Ambulatory Visit (HOSPITAL_COMMUNITY)
Admission: RE | Admit: 2024-08-21 | Discharge: 2024-08-21 | Disposition: A | Discharge status: Home / Self Care | Source: Ambulatory Visit | Attending: Physician Assistant | Admitting: Physician Assistant

## 2024-08-21 ENCOUNTER — Inpatient Hospital Stay: Attending: Internal Medicine

## 2024-08-21 ENCOUNTER — Telehealth: Payer: Self-pay

## 2024-08-21 ENCOUNTER — Encounter: Payer: Self-pay | Admitting: Cardiovascular Disease

## 2024-08-21 DIAGNOSIS — Z79899 Other long term (current) drug therapy: Secondary | ICD-10-CM | POA: Insufficient documentation

## 2024-08-21 DIAGNOSIS — C3412 Malignant neoplasm of upper lobe, left bronchus or lung: Secondary | ICD-10-CM | POA: Insufficient documentation

## 2024-08-21 LAB — CMP (CANCER CENTER ONLY)
ALT: 22 U/L (ref 0–44)
AST: 27 U/L (ref 15–41)
Albumin: 4.2 g/dL (ref 3.5–5.0)
Alkaline Phosphatase: 81 U/L (ref 38–126)
Anion gap: 10 (ref 5–15)
BUN: 19 mg/dL (ref 8–23)
CO2: 24 mmol/L (ref 22–32)
Calcium: 9.8 mg/dL (ref 8.9–10.3)
Chloride: 102 mmol/L (ref 98–111)
Creatinine: 1.81 mg/dL — ABNORMAL HIGH (ref 0.61–1.24)
GFR, Estimated: 39 mL/min — ABNORMAL LOW
Glucose, Bld: 166 mg/dL — ABNORMAL HIGH (ref 70–99)
Potassium: 5.6 mmol/L — ABNORMAL HIGH (ref 3.5–5.1)
Sodium: 135 mmol/L (ref 135–145)
Total Bilirubin: 1.7 mg/dL — ABNORMAL HIGH (ref 0.0–1.2)
Total Protein: 7.3 g/dL (ref 6.5–8.1)

## 2024-08-21 LAB — CBC WITH DIFFERENTIAL (CANCER CENTER ONLY)
Abs Immature Granulocytes: 0.03 K/uL (ref 0.00–0.07)
Basophils Absolute: 0.1 K/uL (ref 0.0–0.1)
Basophils Relative: 1 %
Eosinophils Absolute: 0.2 K/uL (ref 0.0–0.5)
Eosinophils Relative: 2 %
HCT: 47.8 % (ref 39.0–52.0)
Hemoglobin: 17.2 g/dL — ABNORMAL HIGH (ref 13.0–17.0)
Immature Granulocytes: 0 %
Lymphocytes Relative: 20 %
Lymphs Abs: 2.2 K/uL (ref 0.7–4.0)
MCH: 32.8 pg (ref 26.0–34.0)
MCHC: 36 g/dL (ref 30.0–36.0)
MCV: 91 fL (ref 80.0–100.0)
Monocytes Absolute: 0.8 K/uL (ref 0.1–1.0)
Monocytes Relative: 7 %
Neutro Abs: 7.5 K/uL (ref 1.7–7.7)
Neutrophils Relative %: 70 %
Platelet Count: 217 K/uL (ref 150–400)
RBC: 5.25 MIL/uL (ref 4.22–5.81)
RDW: 13.7 % (ref 11.5–15.5)
WBC Count: 10.7 K/uL — ABNORMAL HIGH (ref 4.0–10.5)
nRBC: 0 % (ref 0.0–0.2)

## 2024-08-21 NOTE — Telephone Encounter (Signed)
 Spoke with patient in regards to lab results.  Per Cassie, PA, patients potassium is elevated and would like to recheck labs tomorrow morning.  Patient denies taking a potassium supplement, multivitamin or eating potassium rich foods.  Lab appt made for tomorrow morning at 0830 AM.  Informed patient that someone from our office will give him a call in regards to new labs.  He voiced understanding.

## 2024-08-22 ENCOUNTER — Inpatient Hospital Stay

## 2024-08-22 ENCOUNTER — Telehealth (HOSPITAL_COMMUNITY): Payer: Self-pay | Admitting: Pharmacy Technician

## 2024-08-22 ENCOUNTER — Other Ambulatory Visit (HOSPITAL_COMMUNITY): Payer: Self-pay | Admitting: Internal Medicine

## 2024-08-22 DIAGNOSIS — M810 Age-related osteoporosis without current pathological fracture: Secondary | ICD-10-CM | POA: Insufficient documentation

## 2024-08-22 DIAGNOSIS — C3412 Malignant neoplasm of upper lobe, left bronchus or lung: Secondary | ICD-10-CM | POA: Diagnosis not present

## 2024-08-22 DIAGNOSIS — C3491 Malignant neoplasm of unspecified part of right bronchus or lung: Secondary | ICD-10-CM

## 2024-08-22 LAB — CBC WITH DIFFERENTIAL (CANCER CENTER ONLY)
Abs Immature Granulocytes: 0.03 K/uL (ref 0.00–0.07)
Basophils Absolute: 0.1 K/uL (ref 0.0–0.1)
Basophils Relative: 1 %
Eosinophils Absolute: 0.2 K/uL (ref 0.0–0.5)
Eosinophils Relative: 3 %
HCT: 45.9 % (ref 39.0–52.0)
Hemoglobin: 16.4 g/dL (ref 13.0–17.0)
Immature Granulocytes: 0 %
Lymphocytes Relative: 31 %
Lymphs Abs: 2.9 K/uL (ref 0.7–4.0)
MCH: 32.7 pg (ref 26.0–34.0)
MCHC: 35.7 g/dL (ref 30.0–36.0)
MCV: 91.4 fL (ref 80.0–100.0)
Monocytes Absolute: 0.8 K/uL (ref 0.1–1.0)
Monocytes Relative: 8 %
Neutro Abs: 5.4 K/uL (ref 1.7–7.7)
Neutrophils Relative %: 57 %
Platelet Count: 229 K/uL (ref 150–400)
RBC: 5.02 MIL/uL (ref 4.22–5.81)
RDW: 14 % (ref 11.5–15.5)
WBC Count: 9.4 K/uL (ref 4.0–10.5)
nRBC: 0 % (ref 0.0–0.2)

## 2024-08-22 LAB — CMP (CANCER CENTER ONLY)
ALT: 23 U/L (ref 0–44)
AST: 24 U/L (ref 15–41)
Albumin: 4 g/dL (ref 3.5–5.0)
Alkaline Phosphatase: 80 U/L (ref 38–126)
Anion gap: 9 (ref 5–15)
BUN: 22 mg/dL (ref 8–23)
CO2: 25 mmol/L (ref 22–32)
Calcium: 9.4 mg/dL (ref 8.9–10.3)
Chloride: 103 mmol/L (ref 98–111)
Creatinine: 1.98 mg/dL — ABNORMAL HIGH (ref 0.61–1.24)
GFR, Estimated: 35 mL/min — ABNORMAL LOW
Glucose, Bld: 196 mg/dL — ABNORMAL HIGH (ref 70–99)
Potassium: 4.4 mmol/L (ref 3.5–5.1)
Sodium: 138 mmol/L (ref 135–145)
Total Bilirubin: 1.3 mg/dL — ABNORMAL HIGH (ref 0.0–1.2)
Total Protein: 6.9 g/dL (ref 6.5–8.1)

## 2024-08-22 NOTE — Telephone Encounter (Signed)
 Auth Submission: NO AUTH NEEDED Site of care: [facility] Payer: [employer] MEDICARE Medication & CPT/J Code(s) submitted: Prolia  (Denosumab ) [id number] Diagnosis Code: M81.0 Route of submission (phone, fax, portal):  Phone # Fax # Auth type: Buy/Bill HB Units/visits requested: 60mg  x 2 doses, q 6 months Reference number: [id number] Approval from: 08/22/2024 to 08/13/25    Dagoberto Armour, CPhT [facility] Phone: [phone] 08/22/2024

## 2024-08-22 NOTE — Telephone Encounter (Signed)
 Spoke with patient in regards to lab recheck.  Per Cassie, PA, patients potassium level is WNL.  Patient voiced understanding.

## 2024-08-22 NOTE — Progress Notes (Signed)
 Received Prolia  referral from Dr. Valentin. Patient has [employer] - will need to move forward with [facility]  BMP on 08/19/2024 (attached to referral) - calcium  wnl  Last Prolia  completed 01/24/2024  Sherry Pennant, PharmD, MPH, BCPS, CPP Clinical Pharmacist

## 2024-08-29 ENCOUNTER — Ambulatory Visit (HOSPITAL_COMMUNITY)
Admission: RE | Admit: 2024-08-29 | Discharge: 2024-08-29 | Disposition: A | Discharge status: Home / Self Care | Source: Ambulatory Visit | Attending: Internal Medicine | Admitting: Internal Medicine

## 2024-08-29 VITALS — BP 120/72 | HR 65 | Temp 97.5°F | Resp 15

## 2024-08-29 DIAGNOSIS — M810 Age-related osteoporosis without current pathological fracture: Secondary | ICD-10-CM | POA: Diagnosis present

## 2024-08-29 MED ORDER — DENOSUMAB 60 MG/ML ~~LOC~~ SOSY
60.0000 mg | PREFILLED_SYRINGE | Freq: Once | SUBCUTANEOUS | Status: AC
  Administered 2024-08-29: 60 mg via SUBCUTANEOUS

## 2024-08-29 MED ORDER — DENOSUMAB 60 MG/ML ~~LOC~~ SOSY
PREFILLED_SYRINGE | SUBCUTANEOUS | Status: AC
  Filled 2024-08-29: qty 1

## 2024-09-02 ENCOUNTER — Inpatient Hospital Stay: Admitting: Internal Medicine

## 2024-09-02 ENCOUNTER — Telehealth: Payer: Self-pay | Admitting: Allergy and Immunology

## 2024-09-02 VITALS — BP 138/61 | HR 70 | Temp 97.7°F | Resp 17 | Ht 73.0 in | Wt 192.7 lb

## 2024-09-02 DIAGNOSIS — C3412 Malignant neoplasm of upper lobe, left bronchus or lung: Secondary | ICD-10-CM

## 2024-09-02 NOTE — Telephone Encounter (Signed)
 Harles has been scheduled with:  [facility]  [address] [place], [place] [address] [phone]  Genevieve is scheduled for 11/04/24 at 1:15 pm with Dr. Venus Iles.  I called Brittan and made him aware and Amori states he will keep this appointment and that it works for him.

## 2024-09-02 NOTE — Progress Notes (Signed)
 "      [facility] Telephone:([phone]) [phone]   Fax:([phone]) [phone]  OFFICE PROGRESS NOTE  [name], DO [address] [place] [address]  DIAGNOSIS: Metastatic non-small cell lung cancer, adenocarcinoma initially diagnosed in December of 2004  PRIOR THERAPY: 1) status post course of concurrent chemoradiation under the care of Dr. Melodye. 2) status post left upper lobe wedge resection on 01/10/2005 for recurrent disease in the left lung.  CURRENT THERAPY: Erlotinib  150 mg by mouth daily started in 2006.  Starting August 2022 the patient will be on erlotinib  100 mg p.o. daily.  Status post 40 months of treatment.  INTERVAL HISTORY:  [name] 74 y.o. male returns to the clinic today for follow-up visit accompanied by his wife.Discussed the use of AI scribe software for clinical note transcription with the patient, who gave verbal consent to proceed.  History of Present Illness [name] is a 74 year old male with metastatic non-small cell lung cancer (adenocarcinoma) who presents for evaluation and repeat CT chest for restaging.  He was diagnosed with metastatic non-small cell lung cancer, adenocarcinoma, in December 2004 and has been maintained on erlotinib  therapy since 2006, currently taking 100 mg daily for the past 40 months.  He describes intermittent chest tightness when exposed to cold weather, particularly late at night, without associated chest pain at rest. He has a history of coronary artery disease with three coronary stents and is scheduled for follow-up with his cardiologist. Radiation-induced pulmonary scarring is present from prior treatment.  He experiences persistent, intermittent conjunctival erythema, which has been refractory to multiple courses of antibiotics and topical sprays. He currently uses over-the-counter lubricating eye drops.  Recent laboratory studies reveal persistently elevated serum creatinine, consistent with chronic  kidney disease. He was previously notified of hyperkalemia at another facility, but potassium levels are currently within normal limits.    MEDICAL HISTORY: Past Medical History:  Diagnosis Date   AAA (abdominal aortic aneurysm)    a. 3.4 x 3.5 on scan 02/28/13.   Allergic reaction to contrast dye    Atherosclerosis    CAD (coronary artery disease)    a. stenting of RCA/LCx 2004. b. [place]  08/2013:  s/p DES to LCx for severe ISR.   Diabetes mellitus    Diverticulosis    Emphysema lung (HCC)    Emphysema of lung (HCC)    Encounter for therapeutic drug monitoring 06/06/2016   GERD (gastroesophageal reflux disease)    Hemorrhoids    Hepatic steatosis    History of radiation therapy 09/07/20-09/22/20   SBRT left lung ; Dr Lynwood Nasuti   HTN (hypertension)    Hyperlipidemia    Kidney stone    Lung cancer [facility]) dx'd 2004   chemo/xrt comp; tarceva  ongoing- January 2022 radiation   Stroke [facility])     ALLERGIES:  is allergic to iohexol , ivp dye [iodinated contrast media], morphine and codeine, other, and povidone iodine.  MEDICATIONS:  Current Outpatient Medications  Medication Sig Dispense Refill   glimepiride  (AMARYL ) 4 MG tablet Take 4 mg by mouth in the morning and at bedtime.     ACCU-CHEK AVIVA PLUS test strip 1 each by Other route daily.     albuterol  (VENTOLIN  HFA) 108 (90 Base) MCG/ACT inhaler Inhale 2 puffs into the lungs every 4 (four) hours as needed for shortness of breath or wheezing.     aspirin  81 MG chewable tablet Chew by mouth daily.     atorvastatin  (LIPITOR) 40 MG tablet  Take 40 mg by mouth daily.     Cholecalciferol  (VITAMIN D3) 75 MCG (3000 UT) TABS Take 50 mcg by mouth daily. Takes 50 mcg/day     cyanocobalamin  1000 MCG tablet Take 1,000 mcg by mouth daily.     denosumab  (PROLIA ) 60 MG/ML SOSY injection Inject 60 mg into the skin every 6 (six) months.     doxycycline  (MONODOX ) 100 MG capsule Take 1 capsule (100 mg total) by mouth daily. 30 capsule 2   erlotinib   (TARCEVA ) 100 MG tablet Take 1 tablet by mouth once daily on an empty stomach, 1 hour before meals or 2 hours after. 30 tablet 10   famotidine  (PEPCID ) 40 MG tablet Take 1 tablet (40 mg total) by mouth 2 (two) times daily. 180 tablet 3   ipratropium (ATROVENT ) 0.06 % nasal spray PLACE 2 SPRAYS INTO BOTH NOSTRILS EVERY 6 (SIX) HOURS AS NEEDED FOR RHINITIS. 15 mL 6   JARDIANCE  25 MG TABS tablet Take 25 mg by mouth daily.     Lancets (FREESTYLE) lancets 1 each by Other route daily.     levocetirizine (XYZAL ) 5 MG tablet Take 1 tablet (5 mg total) by mouth daily as needed (Can take an extra dose during flare ups.). 90 tablet 3   levothyroxine  (SYNTHROID ) 25 MCG tablet Take 25 mcg by mouth every morning.     [Paused] losartan  (COZAAR ) 25 MG tablet TAKE 1 TABLET BY MOUTH EVERY DAY FOR 90 DAYS     loteprednol  (LOTEMAX ) 0.5 % ophthalmic suspension Place 2 drops into both eyes daily. 30 mL 5   Magnesium  Glycinate 100 MG CAPS Take 2 capsules Orally nightly     metoprolol  tartrate (LOPRESSOR ) 50 MG tablet TAKE 1 TABLET BY MOUTH TWICE A DAY 180 tablet 3   nitroGLYCERIN  (NITROSTAT ) 0.4 MG SL tablet Place 1 tablet (0.4 mg total) under the tongue every 5 (five) minutes as needed for chest pain (up to 3 doses). 25 tablet 3   nystatin-triamcinolone ointment (MYCOLOG) Apply 1 Application topically as needed. (Patient not taking: Reported on 09/02/2024)     Olopatadine  HCl (PATADAY ) 0.2 % SOLN Place 1 drop into both eyes daily. 8 mL 1   Olopatadine -Mometasone  (RYALTRIS ) 665-25 MCG/ACT SUSP Place 2 sprays into the nose in the morning and at bedtime. 30 g 5   Propylene Glycol, PF, (SYSTANE COMPLETE PF) 0.6 % SOLN Apply 1 drop to eye every 4 (four) hours as needed. 30 mL 1   TRADJENTA  5 MG TABS tablet Take 5 mg by mouth daily.     zaleplon  (SONATA ) 10 MG capsule Take 1 capsule (10 mg total) by mouth at bedtime.     No current facility-administered medications for this visit.    REVIEW OF SYSTEMS:  Constitutional:  positive for fatigue Eyes: positive for redness Ears, nose, mouth, throat, and face: negative Respiratory: negative Cardiovascular: negative Gastrointestinal: negative Genitourinary:negative Integument/breast: negative Hematologic/lymphatic: negative Musculoskeletal:negative Neurological: positive for headaches Behavioral/Psych: negative Endocrine: negative Allergic/Immunologic: negative   PHYSICAL EXAMINATION: General appearance: alert, cooperative, and no distress Head: Normocephalic, without obvious abnormality, atraumatic Neck: no adenopathy Lymph nodes: Cervical, supraclavicular, and axillary nodes normal. Resp: clear to auscultation bilaterally Back: symmetric, no curvature. ROM normal. No CVA tenderness. Cardio: regular rate and rhythm, S1, S2 normal, no murmur, click, rub or gallop GI: soft, non-tender; bowel sounds normal; no masses,  no organomegaly Extremities: extremities normal, atraumatic, no cyanosis or edema Neurologic: Alert and oriented X 3, normal strength and tone. Normal symmetric reflexes. Normal coordination and gait  ECOG PERFORMANCE STATUS: 1 - Symptomatic but completely ambulatory  Blood pressure 138/61, pulse 70, temperature 97.7 F (36.5 C), temperature source Temporal, resp. rate 17, height 6' 1 (1.854 m), weight 192 lb 11.2 oz (87.4 kg).  LABORATORY DATA: Lab Results  Component Value Date   WBC 9.4 08/22/2024   HGB 16.4 08/22/2024   HCT 45.9 08/22/2024   MCV 91.4 08/22/2024   PLT 229 08/22/2024      Chemistry      Component Value Date/Time   NA 138 08/22/2024 0836   NA 139 06/04/2017 0948   K 4.4 08/22/2024 0836   K 5.1 06/04/2017 0948   CL 103 08/22/2024 0836   CL 106 06/10/2012 0900   CO2 25 08/22/2024 0836   CO2 26 06/04/2017 0948   BUN 22 08/22/2024 0836   BUN 14.2 06/04/2017 0948   CREATININE 1.98 (H) 08/22/2024 0836   CREATININE 1.5 (H) 06/04/2017 0948      Component Value Date/Time   CALCIUM  9.4 08/22/2024 0836    CALCIUM  9.6 06/04/2017 0948   ALKPHOS 80 08/22/2024 0836   ALKPHOS 84 06/04/2017 0948   AST 24 08/22/2024 0836   AST 24 06/04/2017 0948   ALT 23 08/22/2024 0836   ALT 35 06/04/2017 0948   BILITOT 1.3 (H) 08/22/2024 0836   BILITOT 0.82 06/04/2017 0948       RADIOGRAPHIC STUDIES: CT Chest Wo Contrast Result Date: 08/21/2024 EXAM: CT CHEST WITHOUT CONTRAST 08/21/2024 02:58:00 PM TECHNIQUE: CT of the chest was performed without the administration of intravenous contrast. Multiplanar reformatted images are provided for review. Automated exposure control, iterative reconstruction, and/or weight based adjustment of the mA/kV was utilized to reduce the radiation dose to as low as reasonably achievable. COMPARISON: 02/14/2024 CLINICAL HISTORY: Non-small cell lung cancer (NSCLC), non-metastatic, assess treatment response. FINDINGS: MEDIASTINUM: Heart and pericardium are unremarkable. Severe 3-vessel coronary atherosclerosis. Thoracic aortic atherosclerosis. The central airways are clear. LYMPH NODES: No mediastinal, hilar or axillary lymphadenopathy. LUNGS AND PLEURA: Radiation changes in the right lower lobe. Status post left upper lobe wedge resection. Moderate centrilobular and paraseptal emphysematous changes. 6 mm posterior right upper lobe nodule along the major fissure, unchanged across multiple priors. No focal consolidation or pulmonary edema. No pleural effusion or pneumothorax. SOFT TISSUES/BONES: Unchanged expansile appearance of the posterior right 7th and 8th ribs, favoring healed metastases on prior PET. Stable pathologic fracture of right posterolateral 6th rib. UPPER ABDOMEN: Limited images of the upper abdomen demonstrate nonobstructing bilateral nephrolithiasis. IMPRESSION: 1. Status post left upper lobe wedge resection with right lower lobe radiation changes. 2. Stable 6 mm posterior right upper lobe nodule along the major fissure. No recurrent or metastatic disease. 3. Stable pathologic  fracture of the right posterolateral sixth rib. Electronically signed by: Pinkie Pebbles MD MD 08/21/2024 08:04 PM EST RP Workstation: [id number]     ASSESSMENT/PLAN:  This is a very pleasant 74 years old white male with metastatic non-small cell lung cancer, adenocarcinoma with positive EGFR mutation and he is currently on treatment with Tarceva  since 2006.  This was a switch of 8 months ago to the generic Erlotinib  and he is tolerating it well except for few episodes of diarrhea and mild skin rash.  Since the switch to the generic Erlotinib , the patient has been complaining of worsening rash conjunctivitis as well as diarrhea that already improved. He also underwent SBRT to the enlarging left upper lobe lung nodule. His treatment was switched to erlotinib  100 mg p.o. twice daily 40 months ago.  The patient has been tolerating this treatment fairly well. He had repeat CT scan of the chest performed recently.  I personally independently reviewed the scan and discussed the result with the patient and his wife.  His scan showed no concerning findings for disease progression. Assessment and Plan Assessment & Plan Metastatic non-small cell lung cancer, adenocarcinoma Disease is well-managed on long-term erlotinib  therapy, with no evidence of progression on recent chest CT. He tolerates treatment without acute oncologic complications. Chronic kidney disease persists with elevated creatinine, though potassium remains normal. Intermittent chest tightness in cold weather requires further evaluation given coronary artery disease and prior stents; radiation-induced scarring is present but not contributory. - Reviewed recent chest CT demonstrating stable disease. - Reviewed laboratory studies; creatinine remains elevated. Advised continued renal function monitoring with primary care. - Recommended cardiology follow-up for evaluation of intermittent chest tightness due to coronary artery disease and stents. -  Continued erlotinib  at current dose. - Scheduled follow-up in three months with laboratory studies. - Planned repeat chest CT in six months.  Conjunctivitis secondary to erlotinib  Conjunctivitis is possibly secondary to erlotinib . Symptoms are intermittent. He uses over-the-counter lubricating eye drops and has steroid eye drops prescribed by ophthalmology. - Recommended continued use of lubricating eye drops. - Advised use of steroid eye drops as prescribed for symptom control. - Reinforced that conjunctivitis is a known erlotinib  side effect and may persist despite dose reduction. The patient was advised to call immediately if he has any concerning symptoms in the interval.  All questions were answered. The patient knows to call the clinic with any problems, questions or concerns. We can certainly see the patient much sooner if necessary.  Disclaimer: This note was dictated with voice recognition software. Similar sounding words can inadvertently be transcribed and may not be corrected upon review.       "

## 2024-09-05 ENCOUNTER — Encounter: Payer: Self-pay | Admitting: Cardiology

## 2024-09-05 ENCOUNTER — Ambulatory Visit: Attending: Cardiovascular Disease | Admitting: Cardiology

## 2024-09-05 VITALS — BP 112/60 | HR 77 | Ht 73.0 in | Wt 191.8 lb

## 2024-09-05 DIAGNOSIS — I2510 Atherosclerotic heart disease of native coronary artery without angina pectoris: Secondary | ICD-10-CM | POA: Diagnosis not present

## 2024-09-05 DIAGNOSIS — I7143 Infrarenal abdominal aortic aneurysm, without rupture: Secondary | ICD-10-CM

## 2024-09-05 DIAGNOSIS — R55 Syncope and collapse: Secondary | ICD-10-CM

## 2024-09-05 DIAGNOSIS — I5032 Chronic diastolic (congestive) heart failure: Secondary | ICD-10-CM | POA: Diagnosis not present

## 2024-09-05 DIAGNOSIS — E782 Mixed hyperlipidemia: Secondary | ICD-10-CM

## 2024-09-05 DIAGNOSIS — I10 Essential (primary) hypertension: Secondary | ICD-10-CM

## 2024-09-05 DIAGNOSIS — I351 Nonrheumatic aortic (valve) insufficiency: Secondary | ICD-10-CM

## 2024-09-05 DIAGNOSIS — I719 Aortic aneurysm of unspecified site, without rupture: Secondary | ICD-10-CM

## 2024-09-05 NOTE — Progress Notes (Signed)
 "  Cardiology Office Note    Date:  09/06/2024  ID:  [name], DOB Aug 15, 1950, MRN [id number] PCP:  [name], DO  Cardiologist:  Ozell Fell, MD  Electrophysiologist:  None   Chief Complaint: Follow up for CAD   History of Present Illness: .   [name] is a 74 y.o. male with visit-pertinent history of coronary artery disease, metastatic lung cancer found in 2004, infrarenal abdominal aortic aneurysm, hypertension, mixed hyperlipidemia, diabetes and COPD.  Patient had recurrence of lung cancer in 2022 requiring radiation, maintained on Erlotinib .   Patient with history of CAD, patient with remote PCI of the right coronary artery, left circumflex vessel in 2004 and repeat stenting of the left circumflex in 2015.  Last echocardiogram in 2024 indicated LVEF 55 to 60%, no RWMA, LV internal cavity size is mildly dilated, G1 DD, RV systolic function size was normal, mitral valve was normal in structure, no evidence of mitral valve regurgitation or stenosis, moderate calcification of the aortic valve, moderate thickening of the aortic valve, aortic valve regurgitation moderate, mild dilation of the aortic root.  Patient was last seen in clinic by Dr. Fell on 08/21/2023 for follow-up evaluation.  Patient had recently had an episode of syncope and decreased responsiveness at home.  Patient's wife was present and witnessed the event, he was taken by EMS to the hospital after regaining consciousness.  He never required CPR or rescue breath.  He is found to have a small subarachnoid hemorrhage, his antiplatelet drugs were stopped.  He had been on aspirin  and clopidogrel  for treatment of CAD.  Patient was evaluate by neurosurgery who cleared him to start back on aspirin  and Plavix .  At time of evaluation patient had remained stable from a cardiac standpoint, noted some mild ptosis comfort when walking uphill and cold weather, denied any other cardiac related symptoms.  He taken 1 sublingual  nitroglycerin  last 6 months.  Today he presents for follow-up.  He reports that he has been doing okay.  Patient continues to note that when he goes outside at night and cold air he will have chest discomfort described as an ache, resolves when returns to the house.  Patient reports that he has tried sublingual nitroglycerin  to break 3 times, has only noted some minor improvements once.  Patient reports that his breathing has remained stable, denies increased lower extremity edema, throbbing or PND.  He denies palpitations, presyncope or syncope.  Patient denies chest pain, pressure or tightness on exertion when it is not cold, reports that he mows his lawn, climbs stairs and is able to complete other yard work without any anginal symptoms. ROS: .   Today he denies shortness of breath, lower extremity edema, fatigue, palpitations, melena, hematuria, hemoptysis, diaphoresis, weakness, presyncope, syncope, orthopnea, and PND.  All other systems are reviewed and otherwise negative. Studies Reviewed: SABRA   EKG:  EKG is ordered today, personally reviewed, demonstrating  EKG Interpretation Date/Time:  Friday September 05 2024 11:04:46 EST Ventricular Rate:  77 PR Interval:  186 QRS Duration:  96 QT Interval:  360 QTC Calculation: 407 R Axis:   2  Text Interpretation: Normal sinus rhythm Normal ECG Confirmed by Miu Chiong [phone]) on 09/05/2024 11:11:06 AM   CV Studies: Cardiac studies reviewed are outlined and summarized above. Otherwise please see EMR for full report. Cardiac Studies & Procedures   ______________________________________________________________________________________________   STRESS TESTS  MYOCARDIAL PERFUSION IMAGING 07/14/2020  Interpretation Summary  Nuclear stress EF: 46%.  Mild generalized hypokinesis.  There was no ST segment deviation noted during stress.  There is mild reduced uptake noted along the inferior wall distribution seen at both rest and partially at stress.  No large territory ischemia identified.  This is an intermediate risk study based upon mildly reduced ejection fraction of 46%.  Oneil Parchment, MD   ECHOCARDIOGRAM  ECHOCARDIOGRAM COMPLETE 12/26/2022  Narrative ECHOCARDIOGRAM REPORT    Patient Name:   [name] Date of Exam: 12/26/2022 Medical Rec #:  [id number]        Height:       73.0 in Accession #:    [id number]       Weight:       195.6 lb Date of Birth:  [birth year]        BSA:          2.131 m Patient Age:    74 years         BP:           187/93 mmHg Patient Gender: M                HR:           69 bpm. Exam Location:  [address]  Procedure: 2D Echo, 3D Echo, Strain Analysis, Cardiac Doppler and Color Doppler  Indications:    I35.1 Aortic Insifficiency  History:        Patient has prior history of Echocardiogram examinations, most recent 05/30/2017. CAD, AAA, Lung cancer; Risk Factors:Hypertension and HLD.  Sonographer:    Waldo Guadalajara RCS Referring Phys: [phone] MICHAEL COOPER  IMPRESSIONS   1. Left ventricular ejection fraction, by estimation, is 55 to 60%. The left ventricle has normal function. The left ventricle has no regional wall motion abnormalities. The left ventricular internal cavity size was mildly dilated. Left ventricular diastolic parameters are consistent with Grade I diastolic dysfunction (impaired relaxation). The average left ventricular global longitudinal strain is -18.3 %. The global longitudinal strain is normal. 2. Right ventricular systolic function is normal. The right ventricular size is normal. 3. The mitral valve is normal in structure. No evidence of mitral valve regurgitation. No evidence of mitral stenosis. 4. The aortic valve is tricuspid. There is moderate calcification of the aortic valve. There is moderate thickening of the aortic valve. Aortic valve regurgitation is moderate. Aortic valve sclerosis/calcification is present, without any evidence of aortic stenosis. 5. Aortic  dilatation noted. There is mild dilatation of the aortic root, measuring 38 mm. There is mild dilatation of the ascending aorta, measuring 38 mm. 6. The inferior vena cava is normal in size with greater than 50% respiratory variability, suggesting right atrial pressure of 3 mmHg.  FINDINGS Left Ventricle: Left ventricular ejection fraction, by estimation, is 55 to 60%. The left ventricle has normal function. The left ventricle has no regional wall motion abnormalities. The average left ventricular global longitudinal strain is -18.3 %. The global longitudinal strain is normal. The left ventricular internal cavity size was mildly dilated. There is no left ventricular hypertrophy. Left ventricular diastolic parameters are consistent with Grade I diastolic dysfunction (impaired relaxation).  Right Ventricle: The right ventricular size is normal. No increase in right ventricular wall thickness. Right ventricular systolic function is normal.  Left Atrium: Left atrial size was normal in size.  Right Atrium: Right atrial size was normal in size.  Pericardium: There is no evidence of pericardial effusion.  Mitral Valve: The mitral valve is normal in structure. No evidence of mitral  valve regurgitation. No evidence of mitral valve stenosis.  Tricuspid Valve: The tricuspid valve is normal in structure. Tricuspid valve regurgitation is trivial. No evidence of tricuspid stenosis.  Aortic Valve: The aortic valve is tricuspid. There is moderate calcification of the aortic valve. There is moderate thickening of the aortic valve. Aortic valve regurgitation is moderate. Aortic regurgitation PHT measures 556 msec. Aortic valve sclerosis/calcification is present, without any evidence of aortic stenosis.  Pulmonic Valve: The pulmonic valve was normal in structure. Pulmonic valve regurgitation is not visualized. No evidence of pulmonic stenosis.  Aorta: Aortic dilatation noted. There is mild dilatation of the  aortic root, measuring 38 mm. There is mild dilatation of the ascending aorta, measuring 38 mm.  Venous: The inferior vena cava is normal in size with greater than 50% respiratory variability, suggesting right atrial pressure of 3 mmHg.  IAS/Shunts: No atrial level shunt detected by color flow Doppler.   LEFT VENTRICLE PLAX 2D LVIDd:         4.60 cm   Diastology LVIDs:         3.30 cm   LV e' medial:    4.57 cm/s LV PW:         1.20 cm   LV E/e' medial:  9.1 LV IVS:        1.10 cm   LV e' lateral:   7.29 cm/s LVOT diam:     2.00 cm   LV E/e' lateral: 5.7 LV SV:         52 LV SV Index:   25        2D Longitudinal Strain LVOT Area:     3.14 cm  2D Strain GLS (A2C):   -19.3 % 2D Strain GLS (A3C):   -18.3 % 2D Strain GLS (A4C):   -17.2 % 2D Strain GLS Avg:     -18.3 %  3D Volume EF: 3D EF:        51 % LV EDV:       138 ml LV ESV:       68 ml LV SV:        70 ml  RIGHT VENTRICLE RV Basal diam:  2.80 cm RV S prime:     14.40 cm/s TAPSE (M-mode): 2.2 cm  LEFT ATRIUM             Index        RIGHT ATRIUM           Index LA diam:        3.60 cm 1.69 cm/m   RA Area:     10.20 cm LA Vol (A2C):   43.8 ml 20.55 ml/m  RA Volume:   19.30 ml  9.06 ml/m LA Vol (A4C):   28.3 ml 13.28 ml/m LA Biplane Vol: 37.9 ml 17.78 ml/m AORTIC VALVE LVOT Vmax:   73.40 cm/s LVOT Vmean:  54.800 cm/s LVOT VTI:    0.167 m AI PHT:      556 msec  AORTA Ao Root diam: 3.80 cm Ao Asc diam:  3.80 cm  MITRAL VALVE MV Area (PHT):             SHUNTS MV Decel Time:             Systemic VTI:  0.17 m MV E velocity: 41.40 cm/s  Systemic Diam: 2.00 cm MV A velocity: 84.90 cm/s MV E/A ratio:  0.49  Maude Emmer MD Electronically signed by Maude Emmer MD Signature Date/Time: 12/26/2022/5:06:44 PM  Final          ______________________________________________________________________________________________       Current Reported Medications:.    Active Medications[1]  Physical Exam:     VS:  BP 112/60   Pulse 77   Ht 6' 1 (1.854 m)   Wt 191 lb 12.8 oz (87 kg)   SpO2 98%   BMI 25.30 kg/m    Wt Readings from Last 3 Encounters:  09/05/24 191 lb 12.8 oz (87 kg)  09/02/24 192 lb 11.2 oz (87.4 kg)  08/19/24 193 lb 14.4 oz (88 kg)    GEN: Well nourished, well developed in no acute distress NECK: No JVD; No carotid bruits CARDIAC: RRR, no murmurs, rubs, gallops RESPIRATORY:  Clear to auscultation without rales, wheezing or rhonchi  ABDOMEN: Soft, non-tender, non-distended EXTREMITIES:  No edema; No acute deformity     Asessement and Plan:.    CAD: Patient with remote PCI of the right coronary artery, left circumflex in 2004 and repeat stenting of the left circumflex in 2015.  Plavix  discontinued at last office visit, continued on aspirin  81 mg daily.  During hospitalization for syncope his isosorbide  was discontinued. Today he reports that he has been occurring midsternal chest discomfort described as an ache when he goes outside and it walks in the cold.  Patient notes that this discomfort only happens when it is cold outside, does not occur with other forms of exertion and resolves when he returns to the house.  He reports that his breathing has remained stable.  Patient reports that he has taken nitroglycerin  three times, reports it only changed his chest discomfort once.  Discussed restarting Imdur , patient deferred at this time in favor of ongoing monitoring.  Patient is able to exert himself without chest pain as long as it is not cold.  Will check echocardiogram as noted below, if persistent can consider ischemic evaluation on follow-up. Reviewed ED precautions. Continue aspirin  81 mg daily, Lipitor 40 mg daily, losartan  25 mg daily, metoprolol  tartrate 50 mg twice daily.  Hyperlipidemia: Last profile indicated LDL 43.  Continue Lipitor 40 mg daily.  Aortic regurgitation: Noted to have moderate aortic valve regurgitation on echo in 12/2022. Today he reports that his  breathing has been stable, he appears euvolemic and well compensated on exam. Check echocardiogram for monitoring of aortic valve regurgitation.  Infrarenal abdominal aortic aneurysm: Abdominal aortic ultrasound in March 2024 showed a 3.9 cm aneurysm, stable from previous studies.  Patient due for AAA duplex.  Hypertension: Blood pressure today 112/60.  Continue current antihypertensive regimen.  Syncope and collapse: Patient denies any recurrent syncope or presyncope.  Reviewed ED precautions.    Disposition: F/u with Dr. Wonda or Meital Riehl, NP in 8 weeks.   Signed, Brandii Lakey D Icie Kuznicki, NP       [1]  Current Meds  Medication Sig   ACCU-CHEK AVIVA PLUS test strip 1 each by Other route daily.   albuterol  (VENTOLIN  HFA) 108 (90 Base) MCG/ACT inhaler Inhale 2 puffs into the lungs every 4 (four) hours as needed for shortness of breath or wheezing.   aspirin  81 MG chewable tablet Chew by mouth daily.   atorvastatin  (LIPITOR) 40 MG tablet Take 40 mg by mouth daily.   Cholecalciferol  (VITAMIN D3) 75 MCG (3000 UT) TABS Take 50 mcg by mouth daily. Takes 50 mcg/day   cyanocobalamin  1000 MCG tablet Take 1,000 mcg by mouth daily.   denosumab  (PROLIA ) 60 MG/ML SOSY injection Inject 60 mg into the skin every 6 (  six) months.   doxycycline  (MONODOX ) 100 MG capsule Take 1 capsule (100 mg total) by mouth daily.   erlotinib  (TARCEVA ) 100 MG tablet Take 1 tablet by mouth once daily on an empty stomach, 1 hour before meals or 2 hours after.   famotidine  (PEPCID ) 40 MG tablet Take 1 tablet (40 mg total) by mouth 2 (two) times daily.   glimepiride  (AMARYL ) 4 MG tablet Take 4 mg by mouth in the morning and at bedtime.   ipratropium (ATROVENT ) 0.06 % nasal spray PLACE 2 SPRAYS INTO BOTH NOSTRILS EVERY 6 (SIX) HOURS AS NEEDED FOR RHINITIS.   JARDIANCE  25 MG TABS tablet Take 25 mg by mouth daily.   Lancets (FREESTYLE) lancets 1 each by Other route daily.   levocetirizine (XYZAL ) 5 MG tablet Take 1 tablet (5 mg  total) by mouth daily as needed (Can take an extra dose during flare ups.).   levothyroxine  (SYNTHROID ) 25 MCG tablet Take 25 mcg by mouth every morning.   [Paused] losartan  (COZAAR ) 25 MG tablet TAKE 1 TABLET BY MOUTH EVERY DAY FOR 90 DAYS   loteprednol  (LOTEMAX ) 0.5 % ophthalmic suspension Place 2 drops into both eyes daily.   Magnesium  Glycinate 100 MG CAPS Take 2 capsules Orally nightly   metoprolol  tartrate (LOPRESSOR ) 50 MG tablet TAKE 1 TABLET BY MOUTH TWICE A DAY   nitroGLYCERIN  (NITROSTAT ) 0.4 MG SL tablet Place 1 tablet (0.4 mg total) under the tongue every 5 (five) minutes as needed for chest pain (up to 3 doses).   Olopatadine  HCl (PATADAY ) 0.2 % SOLN Place 1 drop into both eyes daily.   Olopatadine -Mometasone  (RYALTRIS ) 665-25 MCG/ACT SUSP Place 2 sprays into the nose in the morning and at bedtime.   Propylene Glycol, PF, (SYSTANE COMPLETE PF) 0.6 % SOLN Apply 1 drop to eye every 4 (four) hours as needed.   TRADJENTA  5 MG TABS tablet Take 5 mg by mouth daily.   zaleplon  (SONATA ) 10 MG capsule Take 1 capsule (10 mg total) by mouth at bedtime.   "

## 2024-09-05 NOTE — Patient Instructions (Signed)
 Medication Instructions:  Your physician recommends that you continue on your current medications as directed. Please refer to the Current Medication list given to you today.  *If you need a refill on your cardiac medications before your next appointment, please call your pharmacy*  Lab Work: NONE If you have labs (blood work) drawn today and your tests are completely normal, you will receive your results only by: MyChart Message (if you have MyChart) OR A paper copy in the mail If you have any lab test that is abnormal or we need to change your treatment, we will call you to review the results.  Testing/Procedures: Your physician has requested that you have an echocardiogram. Echocardiography is a painless test that uses sound waves to create images of your heart. It provides your doctor with information about the size and shape of your heart and how well your hearts chambers and valves are working. This procedure takes approximately one hour. There are no restrictions for this procedure. Please do NOT wear cologne, perfume, aftershave, or lotions (deodorant is allowed). Please arrive 15 minutes prior to your appointment time.  Please note: We ask at that you not bring children with you during ultrasound (echo/ vascular) testing. Due to room size and safety concerns, children are not allowed in the ultrasound rooms during exams. Our front office staff cannot provide observation of children in our lobby area while testing is being conducted. An adult accompanying a patient to their appointment will only be allowed in the ultrasound room at the discretion of the ultrasound technician under special circumstances. We apologize for any inconvenience.   Follow-Up: At [facility], you and your health needs are our priority.  As part of our continuing mission to provide you with exceptional heart care, our providers are all part of one team.  This team includes your primary Cardiologist  (physician) and Advanced Practice Providers or APPs (Physician Assistants and Nurse Practitioners) who all work together to provide you with the care you need, when you need it.  Your next appointment:   8 week(s)  Provider:   Ozell Fell, MD or Katlyn West, NP   We recommend signing up for the patient portal called MyChart.  Sign up information is provided on this After Visit Summary.  MyChart is used to connect with patients for Virtual Visits (Telemedicine).  Patients are able to view lab/test results, encounter notes, upcoming appointments, etc.  Non-urgent messages can be sent to your provider as well.   To learn more about what you can do with MyChart, go to [email or web address].   Other Instructions Your physician has requested that you have an abdominal aorta duplex. During this test, an ultrasound is used to evaluate the aorta. Allow 30 minutes for this exam. Do not eat after midnight the day before and avoid carbonated beverages. This will take place at [address], 4th floor.  Please note: We ask at that you not bring children with you during ultrasound (echo/ vascular) testing. Due to room size and safety concerns, children are not allowed in the ultrasound rooms during exams. Our front office staff cannot provide observation of children in our lobby area while testing is being conducted. An adult accompanying a patient to their appointment will only be allowed in the ultrasound room at the discretion of the ultrasound technician under special circumstances. We apologize for any inconvenience.

## 2024-09-06 ENCOUNTER — Encounter: Payer: Self-pay | Admitting: Cardiology

## 2024-09-08 ENCOUNTER — Ambulatory Visit: Admitting: Physician Assistant

## 2024-10-09 ENCOUNTER — Ambulatory Visit (HOSPITAL_COMMUNITY)

## 2024-10-29 ENCOUNTER — Ambulatory Visit: Admitting: Cardiology

## 2024-12-02 ENCOUNTER — Inpatient Hospital Stay: Admitting: Internal Medicine

## 2024-12-02 ENCOUNTER — Inpatient Hospital Stay

## 2025-02-27 ENCOUNTER — Encounter (HOSPITAL_COMMUNITY)
